# Patient Record
Sex: Female | Born: 1935 | Race: White | Hispanic: No | Smoking: Never smoker
Health system: Southern US, Community
[De-identification: ages and names within clinical notes are randomized; demographics above are authoritative.]

## PROBLEM LIST (undated history)

## (undated) DIAGNOSIS — I10 Essential (primary) hypertension: Secondary | ICD-10-CM

## (undated) DIAGNOSIS — J45909 Unspecified asthma, uncomplicated: Secondary | ICD-10-CM

## (undated) DIAGNOSIS — Z9889 Other specified postprocedural states: Secondary | ICD-10-CM

## (undated) DIAGNOSIS — M48061 Spinal stenosis, lumbar region without neurogenic claudication: Secondary | ICD-10-CM

## (undated) DIAGNOSIS — R933 Abnormal findings on diagnostic imaging of other parts of digestive tract: Secondary | ICD-10-CM

## (undated) DIAGNOSIS — IMO0001 Reserved for inherently not codable concepts without codable children: Secondary | ICD-10-CM

## (undated) DIAGNOSIS — R112 Nausea with vomiting, unspecified: Secondary | ICD-10-CM

## (undated) DIAGNOSIS — T8859XA Other complications of anesthesia, initial encounter: Secondary | ICD-10-CM

## (undated) DIAGNOSIS — M1990 Unspecified osteoarthritis, unspecified site: Secondary | ICD-10-CM

## (undated) DIAGNOSIS — N189 Chronic kidney disease, unspecified: Secondary | ICD-10-CM

## (undated) DIAGNOSIS — H903 Sensorineural hearing loss, bilateral: Secondary | ICD-10-CM

## (undated) DIAGNOSIS — K219 Gastro-esophageal reflux disease without esophagitis: Secondary | ICD-10-CM

## (undated) DIAGNOSIS — K5730 Diverticulosis of large intestine without perforation or abscess without bleeding: Secondary | ICD-10-CM

## (undated) DIAGNOSIS — R0602 Shortness of breath: Secondary | ICD-10-CM

## (undated) DIAGNOSIS — K5790 Diverticulosis of intestine, part unspecified, without perforation or abscess without bleeding: Secondary | ICD-10-CM

## (undated) DIAGNOSIS — T4145XA Adverse effect of unspecified anesthetic, initial encounter: Secondary | ICD-10-CM

## (undated) HISTORY — PX: ABDOMINAL HYSTERECTOMY: SHX81

## (undated) HISTORY — DX: Sensorineural hearing loss, bilateral: H90.3

## (undated) HISTORY — DX: Reserved for inherently not codable concepts without codable children: IMO0001

## (undated) HISTORY — DX: Chronic kidney disease, unspecified: N18.9

## (undated) HISTORY — DX: Abnormal findings on diagnostic imaging of other parts of digestive tract: R93.3

## (undated) HISTORY — DX: Spinal stenosis, lumbar region without neurogenic claudication: M48.061

## (undated) HISTORY — DX: Other specified postprocedural states: Z98.890

## (undated) HISTORY — DX: Diverticulosis of large intestine without perforation or abscess without bleeding: K57.30

---

## 1959-08-05 HISTORY — PX: COMBINED HYSTERECTOMY ABDOMINAL W/ A&P REPAIR / OOPHORECTOMY: SUR292

## 1998-03-16 ENCOUNTER — Encounter: Admission: RE | Admit: 1998-03-16 | Discharge: 1998-03-16 | Payer: Self-pay | Admitting: Family Medicine

## 1998-03-23 ENCOUNTER — Encounter: Admission: RE | Admit: 1998-03-23 | Discharge: 1998-03-23 | Payer: Self-pay | Admitting: Family Medicine

## 1998-03-30 ENCOUNTER — Encounter: Admission: RE | Admit: 1998-03-30 | Discharge: 1998-03-30 | Payer: Self-pay | Admitting: Family Medicine

## 1998-04-09 ENCOUNTER — Encounter: Admission: RE | Admit: 1998-04-09 | Discharge: 1998-04-09 | Payer: Self-pay | Admitting: Family Medicine

## 1998-04-20 ENCOUNTER — Encounter: Admission: RE | Admit: 1998-04-20 | Discharge: 1998-04-20 | Payer: Self-pay | Admitting: Family Medicine

## 1998-04-27 ENCOUNTER — Encounter: Admission: RE | Admit: 1998-04-27 | Discharge: 1998-04-27 | Payer: Self-pay | Admitting: Sports Medicine

## 1998-05-05 ENCOUNTER — Encounter: Admission: RE | Admit: 1998-05-05 | Discharge: 1998-05-05 | Payer: Self-pay | Admitting: Family Medicine

## 1998-05-18 ENCOUNTER — Encounter: Admission: RE | Admit: 1998-05-18 | Discharge: 1998-05-18 | Payer: Self-pay | Admitting: Sports Medicine

## 1998-06-01 ENCOUNTER — Encounter: Admission: RE | Admit: 1998-06-01 | Discharge: 1998-06-01 | Payer: Self-pay | Admitting: Family Medicine

## 1998-06-08 ENCOUNTER — Encounter: Admission: RE | Admit: 1998-06-08 | Discharge: 1998-06-08 | Payer: Self-pay | Admitting: Family Medicine

## 1998-06-29 ENCOUNTER — Encounter: Admission: RE | Admit: 1998-06-29 | Discharge: 1998-06-29 | Payer: Self-pay | Admitting: Family Medicine

## 1998-07-20 ENCOUNTER — Encounter: Admission: RE | Admit: 1998-07-20 | Discharge: 1998-07-20 | Payer: Self-pay | Admitting: Sports Medicine

## 1998-07-21 ENCOUNTER — Encounter: Admission: RE | Admit: 1998-07-21 | Discharge: 1998-07-21 | Payer: Self-pay | Admitting: Family Medicine

## 1998-07-23 ENCOUNTER — Encounter: Admission: RE | Admit: 1998-07-23 | Discharge: 1998-07-23 | Payer: Self-pay | Admitting: Family Medicine

## 1998-07-30 ENCOUNTER — Ambulatory Visit (HOSPITAL_COMMUNITY): Admission: RE | Admit: 1998-07-30 | Discharge: 1998-07-30 | Payer: Self-pay | Admitting: Gastroenterology

## 1998-08-17 ENCOUNTER — Encounter: Admission: RE | Admit: 1998-08-17 | Discharge: 1998-08-17 | Payer: Self-pay | Admitting: Family Medicine

## 1998-09-07 ENCOUNTER — Encounter: Admission: RE | Admit: 1998-09-07 | Discharge: 1998-09-07 | Payer: Self-pay | Admitting: Family Medicine

## 1998-09-08 ENCOUNTER — Encounter: Admission: RE | Admit: 1998-09-08 | Discharge: 1998-09-08 | Payer: Self-pay | Admitting: Family Medicine

## 1998-09-22 ENCOUNTER — Encounter: Admission: RE | Admit: 1998-09-22 | Discharge: 1998-09-22 | Payer: Self-pay | Admitting: Family Medicine

## 1998-10-13 ENCOUNTER — Encounter: Admission: RE | Admit: 1998-10-13 | Discharge: 1998-10-13 | Payer: Self-pay | Admitting: Family Medicine

## 1998-11-09 ENCOUNTER — Encounter: Admission: RE | Admit: 1998-11-09 | Discharge: 1998-11-09 | Payer: Self-pay | Admitting: Family Medicine

## 1998-12-22 ENCOUNTER — Encounter: Admission: RE | Admit: 1998-12-22 | Discharge: 1998-12-22 | Payer: Self-pay | Admitting: Family Medicine

## 1999-02-08 ENCOUNTER — Encounter: Admission: RE | Admit: 1999-02-08 | Discharge: 1999-02-08 | Payer: Self-pay | Admitting: Family Medicine

## 1999-03-01 ENCOUNTER — Encounter: Admission: RE | Admit: 1999-03-01 | Discharge: 1999-03-01 | Payer: Self-pay | Admitting: Family Medicine

## 1999-03-29 ENCOUNTER — Encounter: Admission: RE | Admit: 1999-03-29 | Discharge: 1999-03-29 | Payer: Self-pay | Admitting: Family Medicine

## 1999-04-02 ENCOUNTER — Inpatient Hospital Stay (HOSPITAL_COMMUNITY): Admission: EM | Admit: 1999-04-02 | Discharge: 1999-04-03 | Payer: Self-pay | Admitting: Emergency Medicine

## 1999-04-02 ENCOUNTER — Encounter: Payer: Self-pay | Admitting: Emergency Medicine

## 1999-04-12 ENCOUNTER — Encounter: Admission: RE | Admit: 1999-04-12 | Discharge: 1999-04-12 | Payer: Self-pay | Admitting: Family Medicine

## 1999-04-26 ENCOUNTER — Encounter: Admission: RE | Admit: 1999-04-26 | Discharge: 1999-04-26 | Payer: Self-pay | Admitting: Family Medicine

## 1999-05-24 ENCOUNTER — Encounter: Admission: RE | Admit: 1999-05-24 | Discharge: 1999-05-24 | Payer: Self-pay | Admitting: Family Medicine

## 1999-06-14 ENCOUNTER — Encounter: Admission: RE | Admit: 1999-06-14 | Discharge: 1999-06-14 | Payer: Self-pay | Admitting: Family Medicine

## 1999-07-12 ENCOUNTER — Encounter: Admission: RE | Admit: 1999-07-12 | Discharge: 1999-07-12 | Payer: Self-pay | Admitting: Family Medicine

## 1999-07-19 ENCOUNTER — Encounter: Admission: RE | Admit: 1999-07-19 | Discharge: 1999-07-19 | Payer: Self-pay | Admitting: Family Medicine

## 1999-08-15 ENCOUNTER — Encounter: Admission: RE | Admit: 1999-08-15 | Discharge: 1999-08-15 | Payer: Self-pay | Admitting: Family Medicine

## 1999-08-30 ENCOUNTER — Encounter: Admission: RE | Admit: 1999-08-30 | Discharge: 1999-08-30 | Payer: Self-pay | Admitting: Family Medicine

## 1999-09-12 ENCOUNTER — Encounter: Admission: RE | Admit: 1999-09-12 | Discharge: 1999-09-12 | Payer: Self-pay | Admitting: Family Medicine

## 1999-09-21 ENCOUNTER — Encounter: Admission: RE | Admit: 1999-09-21 | Discharge: 1999-09-21 | Payer: Self-pay | Admitting: Family Medicine

## 1999-09-27 ENCOUNTER — Encounter: Admission: RE | Admit: 1999-09-27 | Discharge: 1999-09-27 | Payer: Self-pay | Admitting: Family Medicine

## 1999-11-01 ENCOUNTER — Encounter: Admission: RE | Admit: 1999-11-01 | Discharge: 1999-11-01 | Payer: Self-pay | Admitting: Family Medicine

## 1999-11-22 ENCOUNTER — Encounter: Admission: RE | Admit: 1999-11-22 | Discharge: 1999-11-22 | Payer: Self-pay | Admitting: Sports Medicine

## 1999-12-20 ENCOUNTER — Encounter: Admission: RE | Admit: 1999-12-20 | Discharge: 1999-12-20 | Payer: Self-pay | Admitting: Family Medicine

## 2000-01-11 ENCOUNTER — Encounter: Admission: RE | Admit: 2000-01-11 | Discharge: 2000-01-11 | Payer: Self-pay | Admitting: Family Medicine

## 2000-01-24 ENCOUNTER — Encounter: Admission: RE | Admit: 2000-01-24 | Discharge: 2000-01-24 | Payer: Self-pay | Admitting: Family Medicine

## 2000-02-28 ENCOUNTER — Encounter: Admission: RE | Admit: 2000-02-28 | Discharge: 2000-02-28 | Payer: Self-pay | Admitting: Family Medicine

## 2000-03-20 ENCOUNTER — Encounter: Admission: RE | Admit: 2000-03-20 | Discharge: 2000-03-20 | Payer: Self-pay | Admitting: Family Medicine

## 2000-05-08 ENCOUNTER — Encounter: Admission: RE | Admit: 2000-05-08 | Discharge: 2000-05-08 | Payer: Self-pay | Admitting: Family Medicine

## 2000-05-16 ENCOUNTER — Emergency Department (HOSPITAL_COMMUNITY): Admission: EM | Admit: 2000-05-16 | Discharge: 2000-05-16 | Payer: Self-pay | Admitting: Emergency Medicine

## 2000-05-17 ENCOUNTER — Encounter: Admission: RE | Admit: 2000-05-17 | Discharge: 2000-05-17 | Payer: Self-pay | Admitting: Family Medicine

## 2000-05-22 ENCOUNTER — Encounter: Admission: RE | Admit: 2000-05-22 | Discharge: 2000-05-22 | Payer: Self-pay | Admitting: Family Medicine

## 2000-06-05 ENCOUNTER — Encounter: Admission: RE | Admit: 2000-06-05 | Discharge: 2000-06-05 | Payer: Self-pay | Admitting: Family Medicine

## 2000-06-07 ENCOUNTER — Encounter: Admission: RE | Admit: 2000-06-07 | Discharge: 2000-06-07 | Payer: Self-pay | Admitting: Family Medicine

## 2000-06-08 ENCOUNTER — Encounter: Admission: RE | Admit: 2000-06-08 | Discharge: 2000-06-08 | Payer: Self-pay | Admitting: Family Medicine

## 2000-06-18 ENCOUNTER — Encounter: Admission: RE | Admit: 2000-06-18 | Discharge: 2000-06-18 | Payer: Self-pay | Admitting: Family Medicine

## 2000-06-19 ENCOUNTER — Encounter: Admission: RE | Admit: 2000-06-19 | Discharge: 2000-06-19 | Payer: Self-pay | Admitting: Sports Medicine

## 2000-07-10 ENCOUNTER — Encounter: Admission: RE | Admit: 2000-07-10 | Discharge: 2000-07-10 | Payer: Self-pay | Admitting: Family Medicine

## 2000-09-21 ENCOUNTER — Encounter: Admission: RE | Admit: 2000-09-21 | Discharge: 2000-09-21 | Payer: Self-pay | Admitting: Family Medicine

## 2000-10-05 ENCOUNTER — Encounter: Admission: RE | Admit: 2000-10-05 | Discharge: 2000-10-05 | Payer: Self-pay | Admitting: Family Medicine

## 2000-10-23 ENCOUNTER — Encounter: Admission: RE | Admit: 2000-10-23 | Discharge: 2000-10-23 | Payer: Self-pay | Admitting: Sports Medicine

## 2000-11-06 ENCOUNTER — Encounter: Admission: RE | Admit: 2000-11-06 | Discharge: 2000-11-06 | Payer: Self-pay | Admitting: Family Medicine

## 2000-12-04 HISTORY — PX: CHOLECYSTECTOMY OPEN: SUR202

## 2000-12-11 ENCOUNTER — Encounter: Admission: RE | Admit: 2000-12-11 | Discharge: 2000-12-11 | Payer: Self-pay | Admitting: Family Medicine

## 2000-12-17 ENCOUNTER — Encounter: Payer: Self-pay | Admitting: Family Medicine

## 2000-12-17 ENCOUNTER — Encounter: Admission: RE | Admit: 2000-12-17 | Discharge: 2000-12-17 | Payer: Self-pay | Admitting: Family Medicine

## 2000-12-21 ENCOUNTER — Encounter: Admission: RE | Admit: 2000-12-21 | Discharge: 2000-12-21 | Payer: Self-pay | Admitting: Family Medicine

## 2000-12-27 ENCOUNTER — Encounter: Admission: RE | Admit: 2000-12-27 | Discharge: 2000-12-27 | Payer: Self-pay | Admitting: Family Medicine

## 2000-12-28 ENCOUNTER — Encounter: Payer: Self-pay | Admitting: Family Medicine

## 2000-12-28 ENCOUNTER — Encounter: Admission: RE | Admit: 2000-12-28 | Discharge: 2000-12-28 | Payer: Self-pay | Admitting: Family Medicine

## 2001-01-01 ENCOUNTER — Encounter: Admission: RE | Admit: 2001-01-01 | Discharge: 2001-01-01 | Payer: Self-pay | Admitting: Family Medicine

## 2001-01-30 ENCOUNTER — Encounter: Admission: RE | Admit: 2001-01-30 | Discharge: 2001-01-30 | Payer: Self-pay | Admitting: Family Medicine

## 2001-02-27 ENCOUNTER — Encounter: Admission: RE | Admit: 2001-02-27 | Discharge: 2001-02-27 | Payer: Self-pay | Admitting: Family Medicine

## 2001-04-24 ENCOUNTER — Encounter: Admission: RE | Admit: 2001-04-24 | Discharge: 2001-04-24 | Payer: Self-pay | Admitting: Family Medicine

## 2001-05-03 ENCOUNTER — Encounter: Admission: RE | Admit: 2001-05-03 | Discharge: 2001-05-03 | Payer: Self-pay | Admitting: Family Medicine

## 2001-05-31 ENCOUNTER — Encounter: Admission: RE | Admit: 2001-05-31 | Discharge: 2001-05-31 | Payer: Self-pay | Admitting: Family Medicine

## 2001-07-05 ENCOUNTER — Encounter: Admission: RE | Admit: 2001-07-05 | Discharge: 2001-07-05 | Payer: Self-pay | Admitting: Family Medicine

## 2001-07-24 ENCOUNTER — Encounter: Admission: RE | Admit: 2001-07-24 | Discharge: 2001-07-24 | Payer: Self-pay | Admitting: Family Medicine

## 2001-08-09 ENCOUNTER — Encounter: Admission: RE | Admit: 2001-08-09 | Discharge: 2001-08-09 | Payer: Self-pay | Admitting: Family Medicine

## 2001-09-20 ENCOUNTER — Encounter: Admission: RE | Admit: 2001-09-20 | Discharge: 2001-09-20 | Payer: Self-pay | Admitting: Family Medicine

## 2001-10-15 ENCOUNTER — Encounter: Admission: RE | Admit: 2001-10-15 | Discharge: 2001-10-15 | Payer: Self-pay | Admitting: Family Medicine

## 2001-11-15 ENCOUNTER — Encounter: Admission: RE | Admit: 2001-11-15 | Discharge: 2001-11-15 | Payer: Self-pay | Admitting: Family Medicine

## 2002-01-27 ENCOUNTER — Encounter: Admission: RE | Admit: 2002-01-27 | Discharge: 2002-01-27 | Payer: Self-pay | Admitting: Family Medicine

## 2002-02-01 ENCOUNTER — Inpatient Hospital Stay (HOSPITAL_COMMUNITY): Admission: EM | Admit: 2002-02-01 | Discharge: 2002-02-04 | Payer: Self-pay

## 2002-02-03 ENCOUNTER — Encounter: Admission: RE | Admit: 2002-02-03 | Discharge: 2002-02-03 | Payer: Self-pay | Admitting: Family Medicine

## 2002-02-11 ENCOUNTER — Encounter: Admission: RE | Admit: 2002-02-11 | Discharge: 2002-02-11 | Payer: Self-pay | Admitting: Family Medicine

## 2002-02-12 ENCOUNTER — Encounter: Admission: RE | Admit: 2002-02-12 | Discharge: 2002-02-12 | Payer: Self-pay | Admitting: Family Medicine

## 2002-03-17 ENCOUNTER — Ambulatory Visit (HOSPITAL_COMMUNITY): Admission: RE | Admit: 2002-03-17 | Discharge: 2002-03-17 | Payer: Self-pay | Admitting: Gastroenterology

## 2002-03-17 ENCOUNTER — Encounter (INDEPENDENT_AMBULATORY_CARE_PROVIDER_SITE_OTHER): Payer: Self-pay | Admitting: Specialist

## 2002-05-16 ENCOUNTER — Encounter: Admission: RE | Admit: 2002-05-16 | Discharge: 2002-05-16 | Payer: Self-pay | Admitting: Family Medicine

## 2002-05-19 ENCOUNTER — Encounter: Admission: RE | Admit: 2002-05-19 | Discharge: 2002-05-19 | Payer: Self-pay | Admitting: Family Medicine

## 2002-05-28 ENCOUNTER — Encounter: Payer: Self-pay | Admitting: Otolaryngology

## 2002-05-28 ENCOUNTER — Ambulatory Visit (HOSPITAL_COMMUNITY): Admission: RE | Admit: 2002-05-28 | Discharge: 2002-05-28 | Payer: Self-pay | Admitting: Otolaryngology

## 2002-06-13 ENCOUNTER — Encounter: Admission: RE | Admit: 2002-06-13 | Discharge: 2002-06-13 | Payer: Self-pay | Admitting: Family Medicine

## 2002-06-13 ENCOUNTER — Other Ambulatory Visit: Admission: RE | Admit: 2002-06-13 | Discharge: 2002-06-13 | Payer: Self-pay | Admitting: Otolaryngology

## 2002-07-14 ENCOUNTER — Encounter: Admission: RE | Admit: 2002-07-14 | Discharge: 2002-07-14 | Payer: Self-pay | Admitting: Family Medicine

## 2002-07-29 ENCOUNTER — Encounter: Admission: RE | Admit: 2002-07-29 | Discharge: 2002-07-29 | Payer: Self-pay | Admitting: Family Medicine

## 2002-08-01 ENCOUNTER — Emergency Department (HOSPITAL_COMMUNITY): Admission: EM | Admit: 2002-08-01 | Discharge: 2002-08-02 | Payer: Self-pay | Admitting: Emergency Medicine

## 2002-08-02 ENCOUNTER — Encounter: Payer: Self-pay | Admitting: Emergency Medicine

## 2002-08-06 ENCOUNTER — Encounter: Admission: RE | Admit: 2002-08-06 | Discharge: 2002-08-06 | Payer: Self-pay | Admitting: Family Medicine

## 2002-08-07 ENCOUNTER — Encounter: Payer: Self-pay | Admitting: Otolaryngology

## 2002-08-07 ENCOUNTER — Encounter: Admission: RE | Admit: 2002-08-07 | Discharge: 2002-08-07 | Payer: Self-pay | Admitting: Otolaryngology

## 2002-08-08 ENCOUNTER — Encounter (INDEPENDENT_AMBULATORY_CARE_PROVIDER_SITE_OTHER): Payer: Self-pay | Admitting: Specialist

## 2002-08-08 ENCOUNTER — Ambulatory Visit (HOSPITAL_BASED_OUTPATIENT_CLINIC_OR_DEPARTMENT_OTHER): Admission: RE | Admit: 2002-08-08 | Discharge: 2002-08-08 | Payer: Self-pay | Admitting: Otolaryngology

## 2002-08-29 ENCOUNTER — Encounter: Admission: RE | Admit: 2002-08-29 | Discharge: 2002-08-29 | Payer: Self-pay | Admitting: Family Medicine

## 2002-09-26 ENCOUNTER — Encounter: Admission: RE | Admit: 2002-09-26 | Discharge: 2002-09-26 | Payer: Self-pay | Admitting: Family Medicine

## 2002-10-24 ENCOUNTER — Encounter: Admission: RE | Admit: 2002-10-24 | Discharge: 2002-10-24 | Payer: Self-pay | Admitting: Family Medicine

## 2002-11-21 ENCOUNTER — Encounter: Admission: RE | Admit: 2002-11-21 | Discharge: 2002-11-21 | Payer: Self-pay | Admitting: Family Medicine

## 2002-12-15 ENCOUNTER — Encounter: Admission: RE | Admit: 2002-12-15 | Discharge: 2002-12-15 | Payer: Self-pay | Admitting: Sports Medicine

## 2002-12-15 ENCOUNTER — Encounter: Payer: Self-pay | Admitting: Sports Medicine

## 2002-12-26 ENCOUNTER — Encounter: Admission: RE | Admit: 2002-12-26 | Discharge: 2002-12-26 | Payer: Self-pay | Admitting: Family Medicine

## 2003-01-26 ENCOUNTER — Encounter: Admission: RE | Admit: 2003-01-26 | Discharge: 2003-01-26 | Payer: Self-pay | Admitting: Family Medicine

## 2003-02-10 ENCOUNTER — Encounter: Admission: RE | Admit: 2003-02-10 | Discharge: 2003-02-10 | Payer: Self-pay | Admitting: Family Medicine

## 2003-02-27 ENCOUNTER — Encounter: Admission: RE | Admit: 2003-02-27 | Discharge: 2003-02-27 | Payer: Self-pay | Admitting: Family Medicine

## 2003-04-28 ENCOUNTER — Encounter: Admission: RE | Admit: 2003-04-28 | Discharge: 2003-04-28 | Payer: Self-pay | Admitting: Family Medicine

## 2003-05-16 ENCOUNTER — Encounter: Admission: RE | Admit: 2003-05-16 | Discharge: 2003-05-16 | Payer: Self-pay | Admitting: Family Medicine

## 2003-05-16 ENCOUNTER — Encounter: Payer: Self-pay | Admitting: Family Medicine

## 2003-05-22 ENCOUNTER — Encounter: Admission: RE | Admit: 2003-05-22 | Discharge: 2003-05-22 | Payer: Self-pay | Admitting: Family Medicine

## 2003-05-27 ENCOUNTER — Encounter: Admission: RE | Admit: 2003-05-27 | Discharge: 2003-05-27 | Payer: Self-pay | Admitting: Sports Medicine

## 2003-05-27 ENCOUNTER — Encounter: Payer: Self-pay | Admitting: Sports Medicine

## 2003-06-04 HISTORY — PX: BASAL CELL CARCINOMA EXCISION: SHX1214

## 2003-06-05 ENCOUNTER — Ambulatory Visit (HOSPITAL_COMMUNITY): Admission: RE | Admit: 2003-06-05 | Discharge: 2003-06-05 | Payer: Self-pay | Admitting: Family Medicine

## 2003-06-05 ENCOUNTER — Encounter: Admission: RE | Admit: 2003-06-05 | Discharge: 2003-06-05 | Payer: Self-pay | Admitting: Family Medicine

## 2003-06-16 ENCOUNTER — Encounter: Admission: RE | Admit: 2003-06-16 | Discharge: 2003-06-16 | Payer: Self-pay | Admitting: Family Medicine

## 2003-06-25 ENCOUNTER — Encounter: Payer: Self-pay | Admitting: Surgery

## 2003-06-25 ENCOUNTER — Encounter: Admission: RE | Admit: 2003-06-25 | Discharge: 2003-06-25 | Payer: Self-pay | Admitting: Surgery

## 2003-06-26 ENCOUNTER — Encounter (INDEPENDENT_AMBULATORY_CARE_PROVIDER_SITE_OTHER): Payer: Self-pay | Admitting: Surgery

## 2003-06-26 ENCOUNTER — Ambulatory Visit (HOSPITAL_BASED_OUTPATIENT_CLINIC_OR_DEPARTMENT_OTHER): Admission: RE | Admit: 2003-06-26 | Discharge: 2003-06-26 | Payer: Self-pay | Admitting: Surgery

## 2003-07-14 ENCOUNTER — Encounter: Admission: RE | Admit: 2003-07-14 | Discharge: 2003-07-14 | Payer: Self-pay | Admitting: Family Medicine

## 2003-07-16 ENCOUNTER — Ambulatory Visit (HOSPITAL_BASED_OUTPATIENT_CLINIC_OR_DEPARTMENT_OTHER): Admission: RE | Admit: 2003-07-16 | Discharge: 2003-07-16 | Payer: Self-pay | Admitting: Urology

## 2003-08-07 ENCOUNTER — Encounter: Payer: Self-pay | Admitting: Gastroenterology

## 2003-08-07 ENCOUNTER — Ambulatory Visit (HOSPITAL_COMMUNITY): Admission: RE | Admit: 2003-08-07 | Discharge: 2003-08-07 | Payer: Self-pay | Admitting: Gastroenterology

## 2003-08-11 ENCOUNTER — Encounter: Admission: RE | Admit: 2003-08-11 | Discharge: 2003-08-11 | Payer: Self-pay | Admitting: Family Medicine

## 2003-08-28 ENCOUNTER — Encounter: Payer: Self-pay | Admitting: Urology

## 2003-08-28 ENCOUNTER — Ambulatory Visit (HOSPITAL_COMMUNITY): Admission: RE | Admit: 2003-08-28 | Discharge: 2003-08-28 | Payer: Self-pay | Admitting: Urology

## 2003-09-02 ENCOUNTER — Ambulatory Visit (HOSPITAL_COMMUNITY): Admission: RE | Admit: 2003-09-02 | Discharge: 2003-09-02 | Payer: Self-pay | Admitting: Gastroenterology

## 2003-09-02 ENCOUNTER — Encounter: Payer: Self-pay | Admitting: Gastroenterology

## 2003-09-07 ENCOUNTER — Ambulatory Visit (HOSPITAL_BASED_OUTPATIENT_CLINIC_OR_DEPARTMENT_OTHER): Admission: RE | Admit: 2003-09-07 | Discharge: 2003-09-07 | Payer: Self-pay | Admitting: Urology

## 2003-09-07 ENCOUNTER — Ambulatory Visit (HOSPITAL_COMMUNITY): Admission: RE | Admit: 2003-09-07 | Discharge: 2003-09-07 | Payer: Self-pay | Admitting: Urology

## 2003-09-08 ENCOUNTER — Encounter: Admission: RE | Admit: 2003-09-08 | Discharge: 2003-09-08 | Payer: Self-pay | Admitting: Family Medicine

## 2003-09-22 ENCOUNTER — Encounter: Admission: RE | Admit: 2003-09-22 | Discharge: 2003-09-22 | Payer: Self-pay | Admitting: Family Medicine

## 2003-10-05 HISTORY — PX: RETINAL LASER PROCEDURE: SHX2339

## 2003-10-06 ENCOUNTER — Encounter: Admission: RE | Admit: 2003-10-06 | Discharge: 2003-10-06 | Payer: Self-pay | Admitting: Family Medicine

## 2003-10-16 ENCOUNTER — Ambulatory Visit (HOSPITAL_COMMUNITY): Admission: RE | Admit: 2003-10-16 | Discharge: 2003-10-16 | Payer: Self-pay | Admitting: Urology

## 2003-11-10 ENCOUNTER — Encounter: Admission: RE | Admit: 2003-11-10 | Discharge: 2003-11-10 | Payer: Self-pay | Admitting: Family Medicine

## 2003-12-05 HISTORY — PX: NEPHRECTOMY: SHX65

## 2003-12-08 ENCOUNTER — Encounter: Admission: RE | Admit: 2003-12-08 | Discharge: 2003-12-08 | Payer: Self-pay | Admitting: Family Medicine

## 2003-12-09 ENCOUNTER — Encounter: Admission: RE | Admit: 2003-12-09 | Discharge: 2003-12-09 | Payer: Self-pay | Admitting: Family Medicine

## 2003-12-18 ENCOUNTER — Encounter: Admission: RE | Admit: 2003-12-18 | Discharge: 2003-12-18 | Payer: Self-pay | Admitting: Family Medicine

## 2003-12-28 ENCOUNTER — Encounter (INDEPENDENT_AMBULATORY_CARE_PROVIDER_SITE_OTHER): Payer: Self-pay | Admitting: Specialist

## 2003-12-28 ENCOUNTER — Inpatient Hospital Stay (HOSPITAL_COMMUNITY): Admission: RE | Admit: 2003-12-28 | Discharge: 2004-01-02 | Payer: Self-pay | Admitting: Urology

## 2004-01-26 ENCOUNTER — Encounter: Admission: RE | Admit: 2004-01-26 | Discharge: 2004-01-26 | Payer: Self-pay | Admitting: Family Medicine

## 2004-02-09 ENCOUNTER — Encounter: Admission: RE | Admit: 2004-02-09 | Discharge: 2004-02-09 | Payer: Self-pay | Admitting: Family Medicine

## 2004-03-08 ENCOUNTER — Encounter: Admission: RE | Admit: 2004-03-08 | Discharge: 2004-03-08 | Payer: Self-pay | Admitting: Family Medicine

## 2004-04-05 ENCOUNTER — Encounter: Admission: RE | Admit: 2004-04-05 | Discharge: 2004-04-05 | Payer: Self-pay | Admitting: Family Medicine

## 2004-05-11 ENCOUNTER — Encounter: Admission: RE | Admit: 2004-05-11 | Discharge: 2004-05-11 | Payer: Self-pay | Admitting: Family Medicine

## 2004-06-08 ENCOUNTER — Encounter: Admission: RE | Admit: 2004-06-08 | Discharge: 2004-06-08 | Payer: Self-pay | Admitting: Sports Medicine

## 2004-06-13 ENCOUNTER — Encounter: Admission: RE | Admit: 2004-06-13 | Discharge: 2004-06-13 | Payer: Self-pay | Admitting: Family Medicine

## 2004-07-20 ENCOUNTER — Encounter: Admission: RE | Admit: 2004-07-20 | Discharge: 2004-07-20 | Payer: Self-pay | Admitting: Sports Medicine

## 2004-08-16 ENCOUNTER — Ambulatory Visit: Payer: Self-pay | Admitting: Family Medicine

## 2004-08-26 ENCOUNTER — Ambulatory Visit: Payer: Self-pay | Admitting: Family Medicine

## 2004-09-14 ENCOUNTER — Ambulatory Visit: Payer: Self-pay | Admitting: Family Medicine

## 2004-10-10 ENCOUNTER — Ambulatory Visit: Payer: Self-pay | Admitting: Sports Medicine

## 2004-10-10 ENCOUNTER — Ambulatory Visit (HOSPITAL_COMMUNITY): Admission: RE | Admit: 2004-10-10 | Discharge: 2004-10-10 | Payer: Self-pay | Admitting: Family Medicine

## 2004-10-19 ENCOUNTER — Ambulatory Visit: Payer: Self-pay | Admitting: Family Medicine

## 2004-11-16 ENCOUNTER — Ambulatory Visit: Payer: Self-pay | Admitting: Family Medicine

## 2004-12-02 ENCOUNTER — Ambulatory Visit: Payer: Self-pay | Admitting: Family Medicine

## 2004-12-21 ENCOUNTER — Ambulatory Visit: Payer: Self-pay | Admitting: Family Medicine

## 2005-01-25 ENCOUNTER — Ambulatory Visit: Payer: Self-pay | Admitting: Family Medicine

## 2005-02-21 ENCOUNTER — Encounter: Admission: RE | Admit: 2005-02-21 | Discharge: 2005-02-21 | Payer: Self-pay | Admitting: Family Medicine

## 2005-02-21 ENCOUNTER — Ambulatory Visit: Payer: Self-pay | Admitting: Family Medicine

## 2005-03-01 ENCOUNTER — Ambulatory Visit: Payer: Self-pay | Admitting: Family Medicine

## 2005-03-07 ENCOUNTER — Observation Stay (HOSPITAL_COMMUNITY): Admission: EM | Admit: 2005-03-07 | Discharge: 2005-03-08 | Payer: Self-pay | Admitting: Emergency Medicine

## 2005-03-07 ENCOUNTER — Ambulatory Visit: Payer: Self-pay | Admitting: Family Medicine

## 2005-03-29 ENCOUNTER — Ambulatory Visit: Payer: Self-pay | Admitting: Family Medicine

## 2005-04-26 ENCOUNTER — Ambulatory Visit: Payer: Self-pay | Admitting: Family Medicine

## 2005-05-09 ENCOUNTER — Ambulatory Visit: Payer: Self-pay | Admitting: Family Medicine

## 2005-05-16 ENCOUNTER — Ambulatory Visit: Payer: Self-pay | Admitting: Family Medicine

## 2005-05-24 ENCOUNTER — Ambulatory Visit: Payer: Self-pay | Admitting: Family Medicine

## 2005-06-16 ENCOUNTER — Ambulatory Visit: Payer: Self-pay | Admitting: Family Medicine

## 2005-06-21 ENCOUNTER — Ambulatory Visit: Payer: Self-pay | Admitting: Family Medicine

## 2005-07-26 ENCOUNTER — Ambulatory Visit: Payer: Self-pay | Admitting: Family Medicine

## 2005-08-30 ENCOUNTER — Ambulatory Visit: Payer: Self-pay | Admitting: Family Medicine

## 2005-09-27 ENCOUNTER — Ambulatory Visit: Payer: Self-pay | Admitting: Family Medicine

## 2005-10-23 ENCOUNTER — Encounter: Admission: RE | Admit: 2005-10-23 | Discharge: 2005-10-23 | Payer: Self-pay | Admitting: Gastroenterology

## 2005-11-01 ENCOUNTER — Ambulatory Visit: Payer: Self-pay | Admitting: Family Medicine

## 2005-11-17 ENCOUNTER — Ambulatory Visit: Payer: Self-pay | Admitting: Family Medicine

## 2005-11-22 ENCOUNTER — Ambulatory Visit: Payer: Self-pay | Admitting: Family Medicine

## 2005-12-27 ENCOUNTER — Ambulatory Visit (HOSPITAL_COMMUNITY): Admission: RE | Admit: 2005-12-27 | Discharge: 2005-12-27 | Payer: Self-pay | Admitting: Family Medicine

## 2005-12-27 ENCOUNTER — Ambulatory Visit: Payer: Self-pay | Admitting: Family Medicine

## 2005-12-29 ENCOUNTER — Ambulatory Visit: Payer: Self-pay | Admitting: Family Medicine

## 2006-01-01 ENCOUNTER — Ambulatory Visit (HOSPITAL_COMMUNITY): Admission: RE | Admit: 2006-01-01 | Discharge: 2006-01-01 | Payer: Self-pay | Admitting: Gastroenterology

## 2006-01-02 ENCOUNTER — Ambulatory Visit: Payer: Self-pay | Admitting: Family Medicine

## 2006-01-04 ENCOUNTER — Encounter (INDEPENDENT_AMBULATORY_CARE_PROVIDER_SITE_OTHER): Payer: Self-pay | Admitting: *Deleted

## 2006-01-15 ENCOUNTER — Ambulatory Visit: Payer: Self-pay | Admitting: Family Medicine

## 2006-01-17 ENCOUNTER — Ambulatory Visit: Payer: Self-pay | Admitting: Family Medicine

## 2006-01-23 ENCOUNTER — Ambulatory Visit: Payer: Self-pay | Admitting: Family Medicine

## 2006-01-30 ENCOUNTER — Ambulatory Visit: Payer: Self-pay | Admitting: Family Medicine

## 2006-01-30 DIAGNOSIS — IMO0001 Reserved for inherently not codable concepts without codable children: Secondary | ICD-10-CM

## 2006-01-30 HISTORY — DX: Reserved for inherently not codable concepts without codable children: IMO0001

## 2006-02-27 ENCOUNTER — Ambulatory Visit: Payer: Self-pay | Admitting: Family Medicine

## 2006-02-27 ENCOUNTER — Ambulatory Visit: Payer: Self-pay | Admitting: Sports Medicine

## 2006-03-27 ENCOUNTER — Ambulatory Visit: Payer: Self-pay | Admitting: Family Medicine

## 2006-04-12 ENCOUNTER — Ambulatory Visit: Payer: Self-pay | Admitting: Family Medicine

## 2006-04-24 ENCOUNTER — Ambulatory Visit: Payer: Self-pay | Admitting: Sports Medicine

## 2006-05-16 ENCOUNTER — Ambulatory Visit: Payer: Self-pay | Admitting: Family Medicine

## 2006-05-20 DIAGNOSIS — M48061 Spinal stenosis, lumbar region without neurogenic claudication: Secondary | ICD-10-CM

## 2006-05-20 HISTORY — DX: Spinal stenosis, lumbar region without neurogenic claudication: M48.061

## 2006-05-29 ENCOUNTER — Ambulatory Visit: Payer: Self-pay | Admitting: Family Medicine

## 2006-06-12 ENCOUNTER — Ambulatory Visit: Payer: Self-pay | Admitting: Sports Medicine

## 2006-07-03 ENCOUNTER — Ambulatory Visit: Payer: Self-pay | Admitting: Family Medicine

## 2006-07-31 ENCOUNTER — Ambulatory Visit: Payer: Self-pay | Admitting: Family Medicine

## 2006-08-09 ENCOUNTER — Ambulatory Visit: Payer: Self-pay | Admitting: Family Medicine

## 2006-08-28 ENCOUNTER — Ambulatory Visit: Payer: Self-pay | Admitting: Family Medicine

## 2006-09-14 ENCOUNTER — Ambulatory Visit: Payer: Self-pay | Admitting: Family Medicine

## 2006-09-25 ENCOUNTER — Ambulatory Visit: Payer: Self-pay | Admitting: Family Medicine

## 2006-10-04 DIAGNOSIS — H903 Sensorineural hearing loss, bilateral: Secondary | ICD-10-CM

## 2006-10-04 HISTORY — DX: Sensorineural hearing loss, bilateral: H90.3

## 2006-10-23 ENCOUNTER — Ambulatory Visit: Payer: Self-pay | Admitting: Family Medicine

## 2006-11-03 DIAGNOSIS — Z9889 Other specified postprocedural states: Secondary | ICD-10-CM

## 2006-11-03 HISTORY — DX: Other specified postprocedural states: Z98.890

## 2006-12-11 ENCOUNTER — Ambulatory Visit: Payer: Self-pay | Admitting: Family Medicine

## 2006-12-11 LAB — CONVERTED CEMR LAB
BUN: 40 mg/dL — ABNORMAL HIGH (ref 6–23)
Chloride: 108 meq/L (ref 96–112)
Glucose, Bld: 100 mg/dL — ABNORMAL HIGH (ref 70–99)
Potassium: 4.7 meq/L (ref 3.5–5.3)

## 2007-01-03 ENCOUNTER — Ambulatory Visit: Payer: Self-pay | Admitting: Family Medicine

## 2007-01-03 ENCOUNTER — Encounter (INDEPENDENT_AMBULATORY_CARE_PROVIDER_SITE_OTHER): Payer: Self-pay | Admitting: Family Medicine

## 2007-01-08 ENCOUNTER — Ambulatory Visit: Payer: Self-pay | Admitting: Family Medicine

## 2007-01-29 ENCOUNTER — Ambulatory Visit: Payer: Self-pay | Admitting: Family Medicine

## 2007-01-31 DIAGNOSIS — E1165 Type 2 diabetes mellitus with hyperglycemia: Secondary | ICD-10-CM

## 2007-01-31 DIAGNOSIS — K5730 Diverticulosis of large intestine without perforation or abscess without bleeding: Secondary | ICD-10-CM | POA: Insufficient documentation

## 2007-01-31 DIAGNOSIS — J309 Allergic rhinitis, unspecified: Secondary | ICD-10-CM | POA: Insufficient documentation

## 2007-01-31 DIAGNOSIS — E118 Type 2 diabetes mellitus with unspecified complications: Secondary | ICD-10-CM

## 2007-01-31 DIAGNOSIS — F411 Generalized anxiety disorder: Secondary | ICD-10-CM | POA: Insufficient documentation

## 2007-01-31 DIAGNOSIS — E785 Hyperlipidemia, unspecified: Secondary | ICD-10-CM

## 2007-01-31 DIAGNOSIS — H9190 Unspecified hearing loss, unspecified ear: Secondary | ICD-10-CM | POA: Insufficient documentation

## 2007-01-31 DIAGNOSIS — H269 Unspecified cataract: Secondary | ICD-10-CM

## 2007-01-31 DIAGNOSIS — E1149 Type 2 diabetes mellitus with other diabetic neurological complication: Secondary | ICD-10-CM | POA: Insufficient documentation

## 2007-01-31 DIAGNOSIS — K219 Gastro-esophageal reflux disease without esophagitis: Secondary | ICD-10-CM | POA: Insufficient documentation

## 2007-01-31 DIAGNOSIS — M48061 Spinal stenosis, lumbar region without neurogenic claudication: Secondary | ICD-10-CM | POA: Insufficient documentation

## 2007-02-01 ENCOUNTER — Encounter (INDEPENDENT_AMBULATORY_CARE_PROVIDER_SITE_OTHER): Payer: Self-pay | Admitting: *Deleted

## 2007-02-07 DIAGNOSIS — Z87442 Personal history of urinary calculi: Secondary | ICD-10-CM | POA: Insufficient documentation

## 2007-02-07 DIAGNOSIS — I10 Essential (primary) hypertension: Secondary | ICD-10-CM | POA: Insufficient documentation

## 2007-02-07 DIAGNOSIS — K469 Unspecified abdominal hernia without obstruction or gangrene: Secondary | ICD-10-CM | POA: Insufficient documentation

## 2007-02-23 ENCOUNTER — Encounter: Payer: Self-pay | Admitting: Family Medicine

## 2007-02-23 DIAGNOSIS — E11359 Type 2 diabetes mellitus with proliferative diabetic retinopathy without macular edema: Secondary | ICD-10-CM

## 2007-02-23 DIAGNOSIS — E113599 Type 2 diabetes mellitus with proliferative diabetic retinopathy without macular edema, unspecified eye: Secondary | ICD-10-CM | POA: Insufficient documentation

## 2007-03-05 ENCOUNTER — Ambulatory Visit: Payer: Self-pay | Admitting: Family Medicine

## 2007-03-05 LAB — CONVERTED CEMR LAB
Cholesterol: 157 mg/dL (ref 0–200)
HCT: 37.1 %
Potassium: 4.9 meq/L (ref 3.5–5.3)
RBC: 4.14 M/uL
Sodium: 141 meq/L (ref 135–145)
Total CHOL/HDL Ratio: 3.8
Triglycerides: 132 mg/dL (ref <150)
VLDL: 26 mg/dL (ref 0–40)

## 2007-03-06 ENCOUNTER — Telehealth: Payer: Self-pay | Admitting: Family Medicine

## 2007-03-20 ENCOUNTER — Encounter: Payer: Self-pay | Admitting: Family Medicine

## 2007-04-02 ENCOUNTER — Ambulatory Visit: Payer: Self-pay | Admitting: Family Medicine

## 2007-04-05 ENCOUNTER — Encounter: Admission: RE | Admit: 2007-04-05 | Discharge: 2007-04-05 | Payer: Self-pay | Admitting: Family Medicine

## 2007-04-30 ENCOUNTER — Ambulatory Visit: Payer: Self-pay | Admitting: Family Medicine

## 2007-05-09 ENCOUNTER — Encounter: Payer: Self-pay | Admitting: Family Medicine

## 2007-05-09 ENCOUNTER — Telehealth: Payer: Self-pay | Admitting: Family Medicine

## 2007-05-09 ENCOUNTER — Telehealth: Payer: Self-pay | Admitting: *Deleted

## 2007-05-20 ENCOUNTER — Encounter: Payer: Self-pay | Admitting: Family Medicine

## 2007-05-21 ENCOUNTER — Ambulatory Visit: Payer: Self-pay | Admitting: Family Medicine

## 2007-05-22 ENCOUNTER — Ambulatory Visit: Payer: Self-pay | Admitting: Family Medicine

## 2007-05-22 ENCOUNTER — Inpatient Hospital Stay (HOSPITAL_COMMUNITY): Admission: EM | Admit: 2007-05-22 | Discharge: 2007-05-31 | Payer: Self-pay | Admitting: Emergency Medicine

## 2007-05-22 ENCOUNTER — Encounter: Payer: Self-pay | Admitting: Family Medicine

## 2007-05-24 ENCOUNTER — Encounter: Payer: Self-pay | Admitting: Family Medicine

## 2007-06-01 ENCOUNTER — Telehealth (INDEPENDENT_AMBULATORY_CARE_PROVIDER_SITE_OTHER): Payer: Self-pay | Admitting: Family Medicine

## 2007-06-03 ENCOUNTER — Encounter: Payer: Self-pay | Admitting: Family Medicine

## 2007-06-11 ENCOUNTER — Encounter (HOSPITAL_COMMUNITY): Admission: RE | Admit: 2007-06-11 | Discharge: 2007-08-08 | Payer: Self-pay | Admitting: Nephrology

## 2007-06-13 ENCOUNTER — Inpatient Hospital Stay (HOSPITAL_COMMUNITY): Admission: AD | Admit: 2007-06-13 | Discharge: 2007-06-15 | Payer: Self-pay | Admitting: Cardiology

## 2007-06-18 ENCOUNTER — Ambulatory Visit: Payer: Self-pay | Admitting: Family Medicine

## 2007-06-18 ENCOUNTER — Telehealth: Payer: Self-pay | Admitting: *Deleted

## 2007-06-18 DIAGNOSIS — R269 Unspecified abnormalities of gait and mobility: Secondary | ICD-10-CM

## 2007-06-20 ENCOUNTER — Ambulatory Visit: Payer: Self-pay | Admitting: Family Medicine

## 2007-06-25 ENCOUNTER — Ambulatory Visit: Payer: Self-pay | Admitting: Family Medicine

## 2007-07-04 ENCOUNTER — Ambulatory Visit: Payer: Self-pay | Admitting: Family Medicine

## 2007-07-10 ENCOUNTER — Telehealth: Payer: Self-pay | Admitting: Family Medicine

## 2007-07-10 LAB — CONVERTED CEMR LAB
BUN: 75 mg/dL — ABNORMAL HIGH (ref 6–23)
CO2: 19 meq/L (ref 19–32)
Chloride: 113 meq/L — ABNORMAL HIGH (ref 96–112)
Creatinine, Ser: 2.4 mg/dL — ABNORMAL HIGH (ref 0.40–1.20)

## 2007-07-12 ENCOUNTER — Ambulatory Visit: Payer: Self-pay | Admitting: Family Medicine

## 2007-07-12 ENCOUNTER — Telehealth: Payer: Self-pay | Admitting: Family Medicine

## 2007-07-12 ENCOUNTER — Ambulatory Visit (HOSPITAL_COMMUNITY): Admission: RE | Admit: 2007-07-12 | Discharge: 2007-07-12 | Payer: Self-pay | Admitting: Family Medicine

## 2007-07-12 LAB — CONVERTED CEMR LAB
ALT: 17 units/L (ref 0–35)
AST: 27 units/L (ref 0–37)
Alkaline Phosphatase: 108 units/L (ref 39–117)
Basophils Absolute: 0 10*3/uL (ref 0.0–0.1)
Basophils Relative: 0 % (ref 0–1)
Calcium: 9.6 mg/dL (ref 8.4–10.5)
Chloride: 110 meq/L (ref 96–112)
Creatinine, Ser: 2.57 mg/dL — ABNORMAL HIGH (ref 0.40–1.20)
Eosinophils Absolute: 0.4 10*3/uL (ref 0.0–0.7)
Hemoglobin: 13.4 g/dL (ref 12.0–15.0)
MCHC: 33.5 g/dL (ref 30.0–36.0)
Monocytes Absolute: 0.4 10*3/uL (ref 0.2–0.7)
Neutro Abs: 11.1 10*3/uL — ABNORMAL HIGH (ref 1.7–7.7)
Neutrophils Relative %: 85 % — ABNORMAL HIGH (ref 43–77)
RDW: 17 % — ABNORMAL HIGH (ref 11.5–14.0)
Total Bilirubin: 0.3 mg/dL (ref 0.3–1.2)

## 2007-07-16 ENCOUNTER — Encounter: Payer: Self-pay | Admitting: Family Medicine

## 2007-07-25 ENCOUNTER — Encounter (HOSPITAL_COMMUNITY): Admission: RE | Admit: 2007-07-25 | Discharge: 2007-10-23 | Payer: Self-pay | Admitting: Cardiology

## 2007-08-01 ENCOUNTER — Ambulatory Visit: Payer: Self-pay | Admitting: Family Medicine

## 2007-08-06 ENCOUNTER — Telehealth: Payer: Self-pay | Admitting: Family Medicine

## 2007-08-06 LAB — CONVERTED CEMR LAB
CO2: 16 meq/L — ABNORMAL LOW (ref 19–32)
Glucose, Bld: 126 mg/dL — ABNORMAL HIGH (ref 70–99)
Potassium: 5.6 meq/L — ABNORMAL HIGH (ref 3.5–5.3)
Sodium: 139 meq/L (ref 135–145)

## 2007-08-08 ENCOUNTER — Encounter: Payer: Self-pay | Admitting: Family Medicine

## 2007-08-12 ENCOUNTER — Encounter: Payer: Self-pay | Admitting: Family Medicine

## 2007-08-12 ENCOUNTER — Telehealth: Payer: Self-pay | Admitting: *Deleted

## 2007-08-12 ENCOUNTER — Ambulatory Visit: Payer: Self-pay | Admitting: Sports Medicine

## 2007-08-12 LAB — CONVERTED CEMR LAB
BUN: 88 mg/dL — ABNORMAL HIGH (ref 6–23)
CO2: 17 meq/L — ABNORMAL LOW (ref 19–32)
Calcium: 9.4 mg/dL (ref 8.4–10.5)
Creatinine, Ser: 2.73 mg/dL — ABNORMAL HIGH (ref 0.40–1.20)
HCT: 36.2 % (ref 36.0–46.0)
Hemoglobin: 11.9 g/dL — ABNORMAL LOW (ref 12.0–15.0)
MCV: 90.5 fL (ref 78.0–100.0)
Platelets: 178 10*3/uL (ref 150–400)
RDW: 16.4 % — ABNORMAL HIGH (ref 11.5–14.0)
WBC: 10.4 10*3/uL (ref 4.0–10.5)

## 2007-08-21 ENCOUNTER — Encounter: Payer: Self-pay | Admitting: Family Medicine

## 2007-08-26 ENCOUNTER — Encounter: Payer: Self-pay | Admitting: Family Medicine

## 2007-08-29 ENCOUNTER — Ambulatory Visit: Payer: Self-pay | Admitting: Family Medicine

## 2007-09-09 ENCOUNTER — Encounter: Payer: Self-pay | Admitting: Family Medicine

## 2007-09-20 ENCOUNTER — Encounter: Payer: Self-pay | Admitting: Family Medicine

## 2007-09-25 ENCOUNTER — Encounter: Payer: Self-pay | Admitting: Family Medicine

## 2007-09-26 ENCOUNTER — Encounter: Payer: Self-pay | Admitting: Family Medicine

## 2007-10-22 ENCOUNTER — Encounter: Payer: Self-pay | Admitting: Family Medicine

## 2007-10-24 ENCOUNTER — Ambulatory Visit: Payer: Self-pay | Admitting: Family Medicine

## 2007-10-24 DIAGNOSIS — N039 Chronic nephritic syndrome with unspecified morphologic changes: Secondary | ICD-10-CM

## 2007-10-24 DIAGNOSIS — D631 Anemia in chronic kidney disease: Secondary | ICD-10-CM | POA: Insufficient documentation

## 2007-10-24 LAB — CONVERTED CEMR LAB
Calcium: 9.4 mg/dL (ref 8.4–10.5)
Glucose, Bld: 136 mg/dL — ABNORMAL HIGH (ref 70–99)
MCHC: 32.2 g/dL (ref 30.0–36.0)
MCV: 96.6 fL (ref 78.0–100.0)
Platelets: 176 10*3/uL (ref 150–400)
Potassium: 4.9 meq/L (ref 3.5–5.3)
RBC: 4.09 M/uL (ref 3.87–5.11)
Sodium: 142 meq/L (ref 135–145)
TSH: 1.912 microintl units/mL (ref 0.350–5.50)

## 2007-10-29 ENCOUNTER — Encounter: Payer: Self-pay | Admitting: Family Medicine

## 2007-11-07 ENCOUNTER — Encounter: Payer: Self-pay | Admitting: Family Medicine

## 2007-11-07 ENCOUNTER — Ambulatory Visit: Payer: Self-pay | Admitting: Family Medicine

## 2007-11-07 ENCOUNTER — Telehealth (INDEPENDENT_AMBULATORY_CARE_PROVIDER_SITE_OTHER): Payer: Self-pay | Admitting: *Deleted

## 2007-11-07 DIAGNOSIS — Z85828 Personal history of other malignant neoplasm of skin: Secondary | ICD-10-CM

## 2007-11-13 ENCOUNTER — Telehealth: Payer: Self-pay | Admitting: Family Medicine

## 2007-11-14 ENCOUNTER — Ambulatory Visit: Payer: Self-pay | Admitting: Family Medicine

## 2007-11-15 ENCOUNTER — Encounter: Payer: Self-pay | Admitting: Family Medicine

## 2007-11-18 ENCOUNTER — Encounter: Payer: Self-pay | Admitting: Family Medicine

## 2007-11-21 ENCOUNTER — Encounter: Admission: RE | Admit: 2007-11-21 | Discharge: 2007-11-22 | Payer: Self-pay | Admitting: Ophthalmology

## 2007-11-21 ENCOUNTER — Ambulatory Visit: Payer: Self-pay | Admitting: Family Medicine

## 2007-12-10 ENCOUNTER — Telehealth: Payer: Self-pay | Admitting: Family Medicine

## 2007-12-24 ENCOUNTER — Encounter: Payer: Self-pay | Admitting: Family Medicine

## 2007-12-26 ENCOUNTER — Ambulatory Visit: Payer: Self-pay | Admitting: Family Medicine

## 2007-12-31 ENCOUNTER — Telehealth: Payer: Self-pay | Admitting: Family Medicine

## 2008-01-05 ENCOUNTER — Emergency Department (HOSPITAL_COMMUNITY): Admission: EM | Admit: 2008-01-05 | Discharge: 2008-01-05 | Payer: Self-pay | Admitting: Emergency Medicine

## 2008-01-06 ENCOUNTER — Telehealth: Payer: Self-pay | Admitting: *Deleted

## 2008-01-07 ENCOUNTER — Ambulatory Visit: Payer: Self-pay | Admitting: Family Medicine

## 2008-01-07 DIAGNOSIS — I2510 Atherosclerotic heart disease of native coronary artery without angina pectoris: Secondary | ICD-10-CM | POA: Insufficient documentation

## 2008-01-30 ENCOUNTER — Telehealth: Payer: Self-pay | Admitting: *Deleted

## 2008-01-30 ENCOUNTER — Ambulatory Visit: Payer: Self-pay | Admitting: Family Medicine

## 2008-02-25 ENCOUNTER — Encounter: Payer: Self-pay | Admitting: Family Medicine

## 2008-02-27 ENCOUNTER — Ambulatory Visit: Payer: Self-pay | Admitting: Family Medicine

## 2008-02-27 LAB — CONVERTED CEMR LAB
CO2: 18 meq/L — ABNORMAL LOW (ref 19–32)
Chloride: 111 meq/L (ref 96–112)
Creatinine, Ser: 2.06 mg/dL — ABNORMAL HIGH (ref 0.40–1.20)
Potassium: 5.1 meq/L (ref 3.5–5.3)
Sodium: 145 meq/L (ref 135–145)

## 2008-03-05 ENCOUNTER — Telehealth: Payer: Self-pay | Admitting: Family Medicine

## 2008-03-19 ENCOUNTER — Encounter: Payer: Self-pay | Admitting: Family Medicine

## 2008-03-19 ENCOUNTER — Ambulatory Visit: Payer: Self-pay | Admitting: Family Medicine

## 2008-03-19 LAB — CONVERTED CEMR LAB
HDL: 42 mg/dL (ref >39)
LDL Cholesterol: 126 mg/dL — ABNORMAL HIGH (ref 0–99)
Total CHOL/HDL Ratio: 4.7
Triglycerides: 144 mg/dL (ref <150)
VLDL: 29 mg/dL (ref 0–40)

## 2008-03-23 ENCOUNTER — Encounter: Payer: Self-pay | Admitting: Family Medicine

## 2008-03-26 ENCOUNTER — Ambulatory Visit: Payer: Self-pay | Admitting: Family Medicine

## 2008-03-26 ENCOUNTER — Encounter: Payer: Self-pay | Admitting: Family Medicine

## 2008-03-26 DIAGNOSIS — M25549 Pain in joints of unspecified hand: Secondary | ICD-10-CM | POA: Insufficient documentation

## 2008-03-31 ENCOUNTER — Encounter: Payer: Self-pay | Admitting: Family Medicine

## 2008-03-31 LAB — CONVERTED CEMR LAB: Uric Acid, Serum: 11.2 mg/dL — ABNORMAL HIGH (ref 2.4–7.0)

## 2008-04-07 ENCOUNTER — Telehealth: Payer: Self-pay | Admitting: Family Medicine

## 2008-04-23 ENCOUNTER — Telehealth: Payer: Self-pay | Admitting: Family Medicine

## 2008-04-28 ENCOUNTER — Ambulatory Visit: Payer: Self-pay | Admitting: Family Medicine

## 2008-04-28 DIAGNOSIS — M109 Gout, unspecified: Secondary | ICD-10-CM | POA: Insufficient documentation

## 2008-05-06 ENCOUNTER — Telehealth: Payer: Self-pay | Admitting: *Deleted

## 2008-05-06 ENCOUNTER — Encounter: Payer: Self-pay | Admitting: Family Medicine

## 2008-05-07 ENCOUNTER — Encounter: Payer: Self-pay | Admitting: *Deleted

## 2008-05-28 ENCOUNTER — Ambulatory Visit: Payer: Self-pay | Admitting: Family Medicine

## 2008-05-28 LAB — CONVERTED CEMR LAB
BUN: 74 mg/dL — ABNORMAL HIGH (ref 6–23)
Creatinine, Ser: 2.24 mg/dL — ABNORMAL HIGH (ref 0.40–1.20)
Glucose, Bld: 80 mg/dL (ref 70–99)
Magnesium: 1.5 mg/dL (ref 1.5–2.5)
Potassium: 4.7 meq/L (ref 3.5–5.3)
Uric Acid, Serum: 6.8 mg/dL (ref 2.4–7.0)

## 2008-06-11 ENCOUNTER — Ambulatory Visit: Payer: Self-pay | Admitting: Family Medicine

## 2008-06-15 ENCOUNTER — Encounter: Payer: Self-pay | Admitting: *Deleted

## 2008-06-15 ENCOUNTER — Ambulatory Visit: Payer: Self-pay | Admitting: Family Medicine

## 2008-06-15 ENCOUNTER — Encounter: Payer: Self-pay | Admitting: Family Medicine

## 2008-06-15 DIAGNOSIS — N3941 Urge incontinence: Secondary | ICD-10-CM | POA: Insufficient documentation

## 2008-06-15 LAB — CONVERTED CEMR LAB
Blood in Urine, dipstick: NEGATIVE
Glucose, Urine, Semiquant: NEGATIVE
Protein, U semiquant: 100
Urobilinogen, UA: 0.2
WBC Urine, dipstick: NEGATIVE
pH: 5.5

## 2008-06-16 LAB — CONVERTED CEMR LAB
BUN: 85 mg/dL — ABNORMAL HIGH (ref 6–23)
Creatinine, Ser: 2.77 mg/dL — ABNORMAL HIGH (ref 0.40–1.20)
Potassium: 5.4 meq/L — ABNORMAL HIGH (ref 3.5–5.3)

## 2008-06-18 ENCOUNTER — Ambulatory Visit: Payer: Self-pay | Admitting: Family Medicine

## 2008-06-24 ENCOUNTER — Encounter: Payer: Self-pay | Admitting: Family Medicine

## 2008-06-24 ENCOUNTER — Ambulatory Visit: Payer: Self-pay | Admitting: Family Medicine

## 2008-06-24 LAB — CONVERTED CEMR LAB
BUN: 88 mg/dL — ABNORMAL HIGH (ref 6–23)
CO2: 18 meq/L — ABNORMAL LOW (ref 19–32)
Glucose, Bld: 65 mg/dL — ABNORMAL LOW (ref 70–99)
Hemoglobin: 10.9 g/dL — ABNORMAL LOW (ref 12.0–15.0)
Potassium: 5.7 meq/L — ABNORMAL HIGH (ref 3.5–5.3)
RBC: 3.53 M/uL — ABNORMAL LOW (ref 3.87–5.11)
Sodium: 138 meq/L (ref 135–145)
WBC: 10.1 10*3/uL (ref 4.0–10.5)

## 2008-06-25 ENCOUNTER — Encounter: Payer: Self-pay | Admitting: Family Medicine

## 2008-06-29 ENCOUNTER — Telehealth: Payer: Self-pay | Admitting: Family Medicine

## 2008-06-30 ENCOUNTER — Telehealth: Payer: Self-pay | Admitting: Family Medicine

## 2008-07-02 ENCOUNTER — Ambulatory Visit: Payer: Self-pay | Admitting: Family Medicine

## 2008-07-06 ENCOUNTER — Ambulatory Visit: Payer: Self-pay | Admitting: Family Medicine

## 2008-07-06 ENCOUNTER — Encounter: Payer: Self-pay | Admitting: Family Medicine

## 2008-07-07 LAB — CONVERTED CEMR LAB
BUN: 57 mg/dL — ABNORMAL HIGH (ref 6–23)
CO2: 21 meq/L (ref 19–32)
Calcium: 8.9 mg/dL (ref 8.4–10.5)
Creatinine, Ser: 2.31 mg/dL — ABNORMAL HIGH (ref 0.40–1.20)
Glucose, Bld: 90 mg/dL (ref 70–99)

## 2008-07-14 ENCOUNTER — Ambulatory Visit: Payer: Self-pay | Admitting: Family Medicine

## 2008-07-17 LAB — CONVERTED CEMR LAB
Chloride: 105 meq/L (ref 96–112)
Creatinine, Ser: 2.77 mg/dL — ABNORMAL HIGH (ref 0.40–1.20)
Platelets: 185 10*3/uL (ref 150–400)
Potassium: 5.1 meq/L (ref 3.5–5.3)
RDW: 14.9 % (ref 11.5–15.5)
Sodium: 140 meq/L (ref 135–145)

## 2008-07-28 ENCOUNTER — Ambulatory Visit: Payer: Self-pay | Admitting: Family Medicine

## 2008-07-28 LAB — CONVERTED CEMR LAB
CO2: 19 meq/L (ref 19–32)
Chloride: 108 meq/L (ref 96–112)
Glucose, Bld: 110 mg/dL — ABNORMAL HIGH (ref 70–99)
Potassium: 4.5 meq/L (ref 3.5–5.3)
Sodium: 141 meq/L (ref 135–145)
Uric Acid, Serum: 6.7 mg/dL (ref 2.4–7.0)

## 2008-07-29 ENCOUNTER — Encounter: Payer: Self-pay | Admitting: Family Medicine

## 2008-08-13 ENCOUNTER — Telehealth: Payer: Self-pay | Admitting: Family Medicine

## 2008-08-25 ENCOUNTER — Ambulatory Visit: Payer: Self-pay | Admitting: Family Medicine

## 2008-09-01 ENCOUNTER — Ambulatory Visit: Payer: Self-pay | Admitting: Family Medicine

## 2008-09-02 ENCOUNTER — Telehealth: Payer: Self-pay | Admitting: Family Medicine

## 2008-09-07 ENCOUNTER — Telehealth: Payer: Self-pay | Admitting: Family Medicine

## 2008-09-09 ENCOUNTER — Telehealth: Payer: Self-pay | Admitting: Family Medicine

## 2008-09-10 ENCOUNTER — Telehealth: Payer: Self-pay | Admitting: Family Medicine

## 2008-09-10 ENCOUNTER — Ambulatory Visit: Payer: Self-pay | Admitting: Family Medicine

## 2008-09-11 ENCOUNTER — Encounter: Payer: Self-pay | Admitting: Family Medicine

## 2008-09-14 ENCOUNTER — Telehealth: Payer: Self-pay | Admitting: Family Medicine

## 2008-09-17 ENCOUNTER — Telehealth: Payer: Self-pay | Admitting: Family Medicine

## 2008-09-24 ENCOUNTER — Encounter: Payer: Self-pay | Admitting: Family Medicine

## 2008-09-28 ENCOUNTER — Ambulatory Visit: Payer: Self-pay | Admitting: Family Medicine

## 2008-09-28 LAB — CONVERTED CEMR LAB
BUN: 79 mg/dL — ABNORMAL HIGH (ref 6–23)
Chloride: 107 meq/L (ref 96–112)
Hemoglobin: 10.9 g/dL — ABNORMAL LOW (ref 12.0–15.0)
Platelets: 207 10*3/uL (ref 150–400)
Potassium: 4.6 meq/L (ref 3.5–5.3)
RDW: 14.1 % (ref 11.5–15.5)

## 2008-10-01 ENCOUNTER — Ambulatory Visit: Payer: Self-pay | Admitting: Family Medicine

## 2008-10-03 IMAGING — CR DG FINGER INDEX 2+V*R*
3 series · 3 of 3 positions shown · non-contrast
Comparison: none

CLINICAL DATA: Diabetes.  Pain and swelling of the DIP joint of second finger.
 RIGHT SECOND FINGER ? 3 VIEWS:

[x finger pa right]
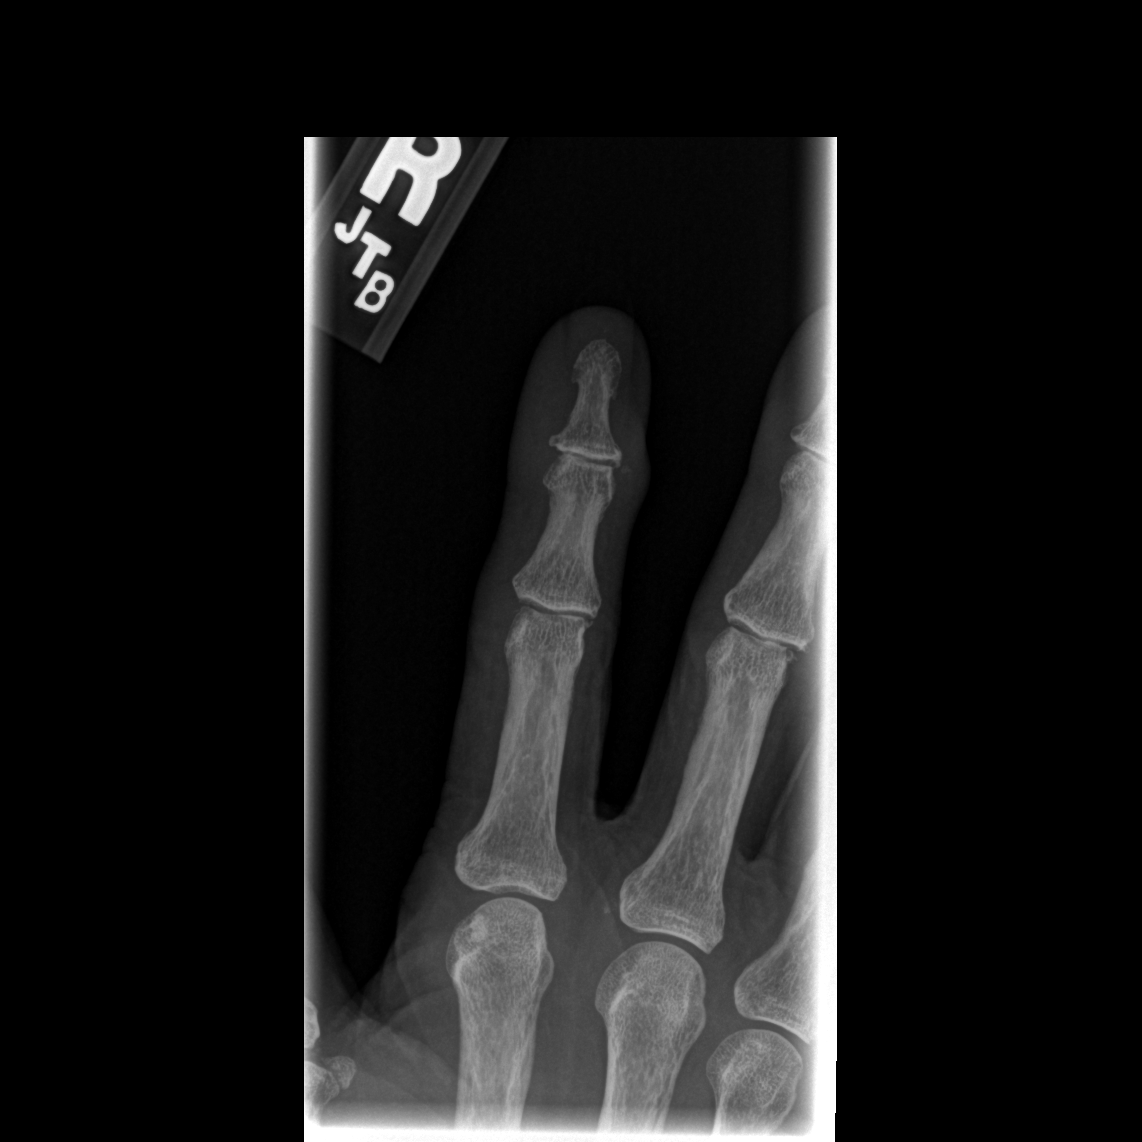

[x finger obl. right]
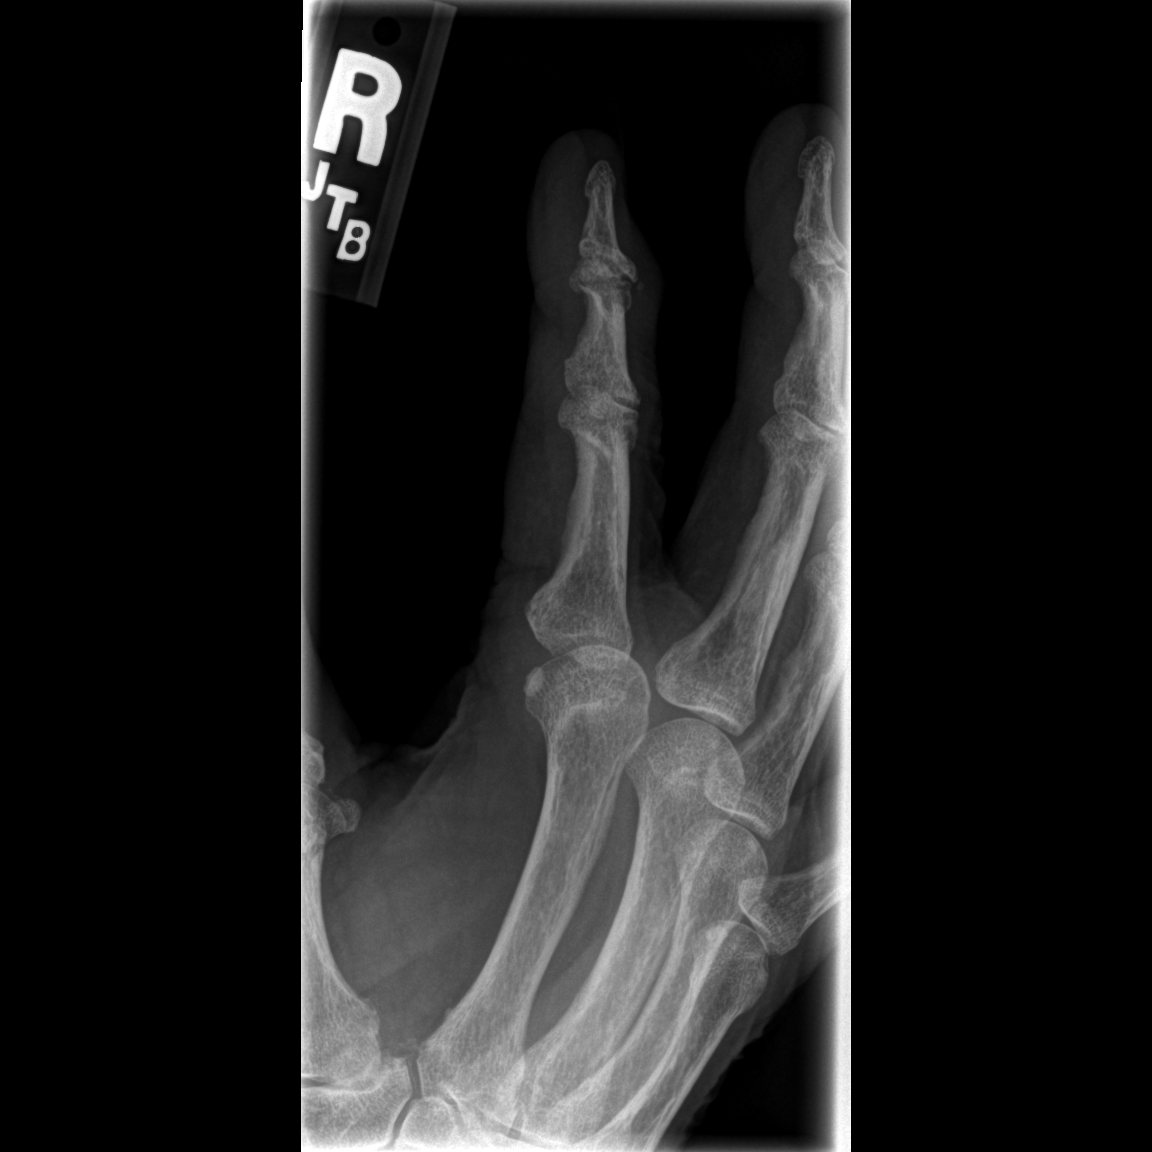

[x finger lateral right]
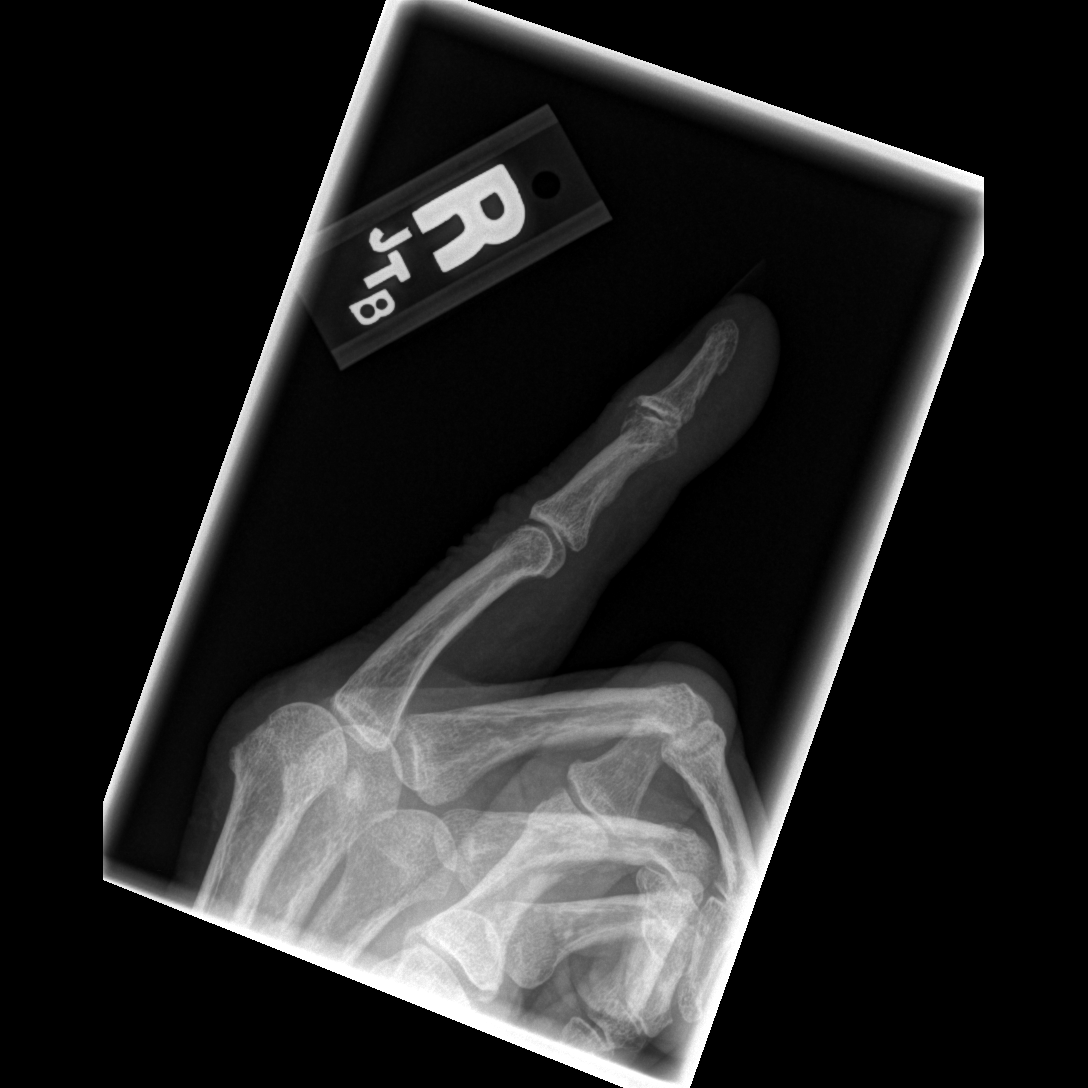

[3 of 3 positions shown; findings below may reference images not displayed]

FINDINGS: Normal alignment and no fracture.   Degenerative spurring is present in the DIP joint.  No erosion identified.
IMPRESSION: Osteoarthritis of the second DIP.  No acute bony abnormality.

## 2008-10-09 ENCOUNTER — Encounter: Payer: Self-pay | Admitting: Family Medicine

## 2008-10-19 ENCOUNTER — Telehealth: Payer: Self-pay | Admitting: Family Medicine

## 2008-10-22 ENCOUNTER — Ambulatory Visit: Payer: Self-pay | Admitting: Family Medicine

## 2008-10-22 DIAGNOSIS — N185 Chronic kidney disease, stage 5: Secondary | ICD-10-CM | POA: Insufficient documentation

## 2008-10-23 ENCOUNTER — Telehealth: Payer: Self-pay | Admitting: Family Medicine

## 2008-10-23 LAB — CONVERTED CEMR LAB
CO2: 22 meq/L (ref 19–32)
Glucose, Bld: 73 mg/dL (ref 70–99)
Potassium: 4.6 meq/L (ref 3.5–5.3)
Sodium: 142 meq/L (ref 135–145)

## 2008-11-02 ENCOUNTER — Encounter (INDEPENDENT_AMBULATORY_CARE_PROVIDER_SITE_OTHER): Payer: Self-pay | Admitting: *Deleted

## 2008-11-19 ENCOUNTER — Ambulatory Visit: Payer: Self-pay | Admitting: Family Medicine

## 2008-12-02 ENCOUNTER — Encounter: Payer: Self-pay | Admitting: Family Medicine

## 2008-12-09 ENCOUNTER — Encounter: Payer: Self-pay | Admitting: Family Medicine

## 2008-12-09 ENCOUNTER — Telehealth: Payer: Self-pay | Admitting: Family Medicine

## 2008-12-10 ENCOUNTER — Encounter: Payer: Self-pay | Admitting: Family Medicine

## 2008-12-17 ENCOUNTER — Ambulatory Visit: Payer: Self-pay | Admitting: Family Medicine

## 2008-12-18 ENCOUNTER — Telehealth: Payer: Self-pay | Admitting: Family Medicine

## 2009-01-05 ENCOUNTER — Encounter: Payer: Self-pay | Admitting: Family Medicine

## 2009-01-11 ENCOUNTER — Telehealth: Payer: Self-pay | Admitting: Family Medicine

## 2009-01-20 ENCOUNTER — Encounter: Payer: Self-pay | Admitting: Family Medicine

## 2009-01-21 ENCOUNTER — Ambulatory Visit: Payer: Self-pay | Admitting: Family Medicine

## 2009-01-21 ENCOUNTER — Encounter: Payer: Self-pay | Admitting: Family Medicine

## 2009-01-22 LAB — CONVERTED CEMR LAB
BUN: 68 mg/dL — ABNORMAL HIGH (ref 6–23)
CO2: 21 meq/L (ref 19–32)
Chloride: 106 meq/L (ref 96–112)
Creatinine, Ser: 2.72 mg/dL — ABNORMAL HIGH (ref 0.40–1.20)

## 2009-01-28 ENCOUNTER — Ambulatory Visit: Payer: Self-pay | Admitting: *Deleted

## 2009-02-11 ENCOUNTER — Ambulatory Visit: Payer: Self-pay | Admitting: Family Medicine

## 2009-02-11 DIAGNOSIS — K589 Irritable bowel syndrome without diarrhea: Secondary | ICD-10-CM

## 2009-02-12 ENCOUNTER — Ambulatory Visit (HOSPITAL_COMMUNITY): Admission: RE | Admit: 2009-02-12 | Discharge: 2009-02-12 | Payer: Self-pay | Admitting: *Deleted

## 2009-02-12 ENCOUNTER — Ambulatory Visit: Payer: Self-pay | Admitting: *Deleted

## 2009-02-22 ENCOUNTER — Ambulatory Visit: Payer: Self-pay | Admitting: *Deleted

## 2009-02-25 ENCOUNTER — Ambulatory Visit: Payer: Self-pay | Admitting: *Deleted

## 2009-02-26 ENCOUNTER — Ambulatory Visit (HOSPITAL_COMMUNITY): Admission: RE | Admit: 2009-02-26 | Discharge: 2009-02-26 | Payer: Self-pay | Admitting: Vascular Surgery

## 2009-03-02 ENCOUNTER — Ambulatory Visit: Payer: Self-pay | Admitting: Vascular Surgery

## 2009-03-18 ENCOUNTER — Ambulatory Visit: Payer: Self-pay | Admitting: Family Medicine

## 2009-03-18 LAB — CONVERTED CEMR LAB
AST: 22 units/L (ref 0–37)
Albumin: 4 g/dL (ref 3.5–5.2)
Alkaline Phosphatase: 96 units/L (ref 39–117)
HDL: 40 mg/dL (ref >39)
LDL Cholesterol: 70 mg/dL (ref 0–99)
MCHC: 33.1 g/dL (ref 30.0–36.0)
Potassium: 4.8 meq/L (ref 3.5–5.3)
RDW: 13.7 % (ref 11.5–15.5)
Sodium: 143 meq/L (ref 135–145)
TSH: 2.941 microintl units/mL (ref 0.350–4.500)
Total Protein: 6.8 g/dL (ref 6.0–8.3)

## 2009-03-24 ENCOUNTER — Encounter: Payer: Self-pay | Admitting: Family Medicine

## 2009-04-01 ENCOUNTER — Ambulatory Visit: Payer: Self-pay | Admitting: Family Medicine

## 2009-04-05 ENCOUNTER — Encounter: Payer: Self-pay | Admitting: *Deleted

## 2009-04-13 ENCOUNTER — Telehealth: Payer: Self-pay | Admitting: Family Medicine

## 2009-04-15 ENCOUNTER — Ambulatory Visit: Payer: Self-pay | Admitting: *Deleted

## 2009-04-15 ENCOUNTER — Encounter: Payer: Self-pay | Admitting: Family Medicine

## 2009-04-21 ENCOUNTER — Encounter: Payer: Self-pay | Admitting: Family Medicine

## 2009-04-29 ENCOUNTER — Ambulatory Visit: Payer: Self-pay | Admitting: Family Medicine

## 2009-05-10 ENCOUNTER — Ambulatory Visit: Payer: Self-pay | Admitting: Family Medicine

## 2009-05-14 ENCOUNTER — Telehealth: Payer: Self-pay | Admitting: Family Medicine

## 2009-05-18 ENCOUNTER — Telehealth: Payer: Self-pay | Admitting: Family Medicine

## 2009-05-20 ENCOUNTER — Encounter: Payer: Self-pay | Admitting: Family Medicine

## 2009-05-20 ENCOUNTER — Ambulatory Visit: Payer: Self-pay | Admitting: Family Medicine

## 2009-05-21 LAB — CONVERTED CEMR LAB
Glucose, Bld: 223 mg/dL — ABNORMAL HIGH (ref 70–99)
MCHC: 34.1 g/dL (ref 30.0–36.0)
Platelets: 226 10*3/uL (ref 150–400)
Potassium: 4.5 meq/L (ref 3.5–5.3)
RBC: 3.62 M/uL — ABNORMAL LOW (ref 3.87–5.11)
Sodium: 138 meq/L (ref 135–145)
Uric Acid, Serum: 8.4 mg/dL — ABNORMAL HIGH (ref 2.4–7.0)
WBC: 15.5 10*3/uL — ABNORMAL HIGH (ref 4.0–10.5)

## 2009-05-27 ENCOUNTER — Ambulatory Visit: Payer: Self-pay | Admitting: Family Medicine

## 2009-05-27 DIAGNOSIS — Z905 Acquired absence of kidney: Secondary | ICD-10-CM | POA: Insufficient documentation

## 2009-06-08 ENCOUNTER — Telehealth: Payer: Self-pay | Admitting: Family Medicine

## 2009-06-15 ENCOUNTER — Ambulatory Visit: Payer: Self-pay | Admitting: Family Medicine

## 2009-06-15 ENCOUNTER — Encounter: Payer: Self-pay | Admitting: Family Medicine

## 2009-06-15 DIAGNOSIS — R5383 Other fatigue: Secondary | ICD-10-CM

## 2009-06-15 DIAGNOSIS — R5381 Other malaise: Secondary | ICD-10-CM

## 2009-06-16 LAB — CONVERTED CEMR LAB
BUN: 85 mg/dL — ABNORMAL HIGH (ref 6–23)
Chloride: 110 meq/L (ref 96–112)
Potassium: 3.8 meq/L (ref 3.5–5.3)
Sodium: 141 meq/L (ref 135–145)

## 2009-06-17 ENCOUNTER — Encounter: Payer: Self-pay | Admitting: Family Medicine

## 2009-06-24 ENCOUNTER — Ambulatory Visit: Payer: Self-pay | Admitting: Family Medicine

## 2009-06-30 ENCOUNTER — Telehealth: Payer: Self-pay | Admitting: Family Medicine

## 2009-07-01 ENCOUNTER — Encounter: Payer: Self-pay | Admitting: Family Medicine

## 2009-07-02 ENCOUNTER — Encounter: Payer: Self-pay | Admitting: Family Medicine

## 2009-07-02 ENCOUNTER — Ambulatory Visit: Payer: Self-pay | Admitting: Family Medicine

## 2009-07-02 LAB — CONVERTED CEMR LAB
Alkaline Phosphatase: 131 units/L — ABNORMAL HIGH (ref 39–117)
BUN: 66 mg/dL — ABNORMAL HIGH (ref 6–23)
CO2: 17 meq/L — ABNORMAL LOW (ref 19–32)
Creatinine, Ser: 3.5 mg/dL — ABNORMAL HIGH (ref 0.40–1.20)
Glucose, Bld: 156 mg/dL — ABNORMAL HIGH (ref 70–99)
HCT: 36.7 % (ref 36.0–46.0)
Hemoglobin: 12.1 g/dL (ref 12.0–15.0)
MCHC: 33 g/dL (ref 30.0–36.0)
MCV: 97.1 fL (ref 78.0–100.0)
RBC: 3.78 M/uL — ABNORMAL LOW (ref 3.87–5.11)
Total Bilirubin: 0.4 mg/dL (ref 0.3–1.2)
Uric Acid, Serum: 6.5 mg/dL (ref 2.4–7.0)

## 2009-07-05 ENCOUNTER — Encounter: Payer: Self-pay | Admitting: Family Medicine

## 2009-07-07 ENCOUNTER — Ambulatory Visit: Payer: Self-pay | Admitting: Family Medicine

## 2009-07-07 ENCOUNTER — Telehealth: Payer: Self-pay | Admitting: Family Medicine

## 2009-07-07 ENCOUNTER — Encounter: Payer: Self-pay | Admitting: Family Medicine

## 2009-07-12 ENCOUNTER — Telehealth: Payer: Self-pay | Admitting: *Deleted

## 2009-07-13 LAB — CONVERTED CEMR LAB
ALT: 28 units/L (ref 0–35)
AST: 43 units/L — ABNORMAL HIGH (ref 0–37)
Albumin: 3.8 g/dL (ref 3.5–5.2)
BUN: 65 mg/dL — ABNORMAL HIGH (ref 6–23)
CO2: 16 meq/L — ABNORMAL LOW (ref 19–32)
Calcium: 7.2 mg/dL — ABNORMAL LOW (ref 8.4–10.5)
Chloride: 110 meq/L (ref 96–112)
Creatinine, Ser: 3.3 mg/dL — ABNORMAL HIGH (ref 0.40–1.20)
Eosinophils Absolute: 2.4 10*3/uL — ABNORMAL HIGH (ref 0.0–0.7)
Eosinophils Relative: 21 % — ABNORMAL HIGH (ref 0–5)
HCT: 32.2 % — ABNORMAL LOW (ref 36.0–46.0)
Lymphocytes Relative: 12 % (ref 12–46)
Lymphs Abs: 1.4 10*3/uL (ref 0.7–4.0)
MCV: 94.7 fL (ref 78.0–100.0)
Monocytes Relative: 7 % (ref 3–12)
Potassium: 3.8 meq/L (ref 3.5–5.3)
RBC: 3.4 M/uL — ABNORMAL LOW (ref 3.87–5.11)
WBC: 11.5 10*3/uL — ABNORMAL HIGH (ref 4.0–10.5)

## 2009-07-15 ENCOUNTER — Encounter: Payer: Self-pay | Admitting: Family Medicine

## 2009-07-21 ENCOUNTER — Encounter: Payer: Self-pay | Admitting: Family Medicine

## 2009-07-22 ENCOUNTER — Encounter: Payer: Self-pay | Admitting: Family Medicine

## 2009-07-22 LAB — CONVERTED CEMR LAB
Albumin: 4.2 g/dL
Chloride: 106 meq/L
Creatinine, Ser: 2.69 mg/dL
Hemoglobin: 11.5 g/dL
Phosphorus: 3.9 mg/dL

## 2009-07-26 ENCOUNTER — Encounter: Payer: Self-pay | Admitting: Family Medicine

## 2009-07-29 ENCOUNTER — Ambulatory Visit: Payer: Self-pay | Admitting: Family Medicine

## 2009-07-29 LAB — CONVERTED CEMR LAB: Hgb A1c MFr Bld: 6 %

## 2009-07-30 ENCOUNTER — Encounter: Payer: Self-pay | Admitting: *Deleted

## 2009-08-26 ENCOUNTER — Encounter: Payer: Self-pay | Admitting: Family Medicine

## 2009-08-26 ENCOUNTER — Ambulatory Visit: Payer: Self-pay | Admitting: Family Medicine

## 2009-08-26 LAB — CONVERTED CEMR LAB
CO2: 21 meq/L (ref 19–32)
Calcium: 9.3 mg/dL (ref 8.4–10.5)
Creatinine, Ser: 2.71 mg/dL — ABNORMAL HIGH (ref 0.40–1.20)
Sodium: 140 meq/L (ref 135–145)
Uric Acid, Serum: 6 mg/dL (ref 2.4–7.0)

## 2009-09-09 ENCOUNTER — Ambulatory Visit: Payer: Self-pay | Admitting: Family Medicine

## 2009-09-09 ENCOUNTER — Telehealth: Payer: Self-pay | Admitting: Family Medicine

## 2009-09-09 LAB — CONVERTED CEMR LAB
Bilirubin Urine: NEGATIVE
Glucose, Urine, Semiquant: NEGATIVE
Ketones, urine, test strip: NEGATIVE
Nitrite: NEGATIVE
Protein, U semiquant: 100
Specific Gravity, Urine: 1.01

## 2009-09-10 ENCOUNTER — Encounter: Payer: Self-pay | Admitting: Family Medicine

## 2009-09-13 ENCOUNTER — Telehealth: Payer: Self-pay | Admitting: *Deleted

## 2009-09-23 ENCOUNTER — Ambulatory Visit: Payer: Self-pay | Admitting: Family Medicine

## 2009-10-12 ENCOUNTER — Encounter: Payer: Self-pay | Admitting: Family Medicine

## 2009-10-27 ENCOUNTER — Encounter: Payer: Self-pay | Admitting: Family Medicine

## 2009-11-03 ENCOUNTER — Telehealth: Payer: Self-pay | Admitting: Family Medicine

## 2009-11-19 ENCOUNTER — Encounter: Payer: Self-pay | Admitting: Family Medicine

## 2009-11-19 ENCOUNTER — Ambulatory Visit: Payer: Self-pay | Admitting: Family Medicine

## 2009-11-19 LAB — CONVERTED CEMR LAB: Hgb A1c MFr Bld: 6.5 %

## 2009-11-23 LAB — CONVERTED CEMR LAB
BUN: 64 mg/dL — ABNORMAL HIGH (ref 6–23)
CO2: 21 meq/L (ref 19–32)
Calcium: 9 mg/dL (ref 8.4–10.5)
Chloride: 102 meq/L (ref 96–112)
Creatinine, Ser: 2.37 mg/dL — ABNORMAL HIGH (ref 0.40–1.20)
Glucose, Bld: 127 mg/dL — ABNORMAL HIGH (ref 70–99)
Total Bilirubin: 0.3 mg/dL (ref 0.3–1.2)
Uric Acid, Serum: 6.1 mg/dL (ref 2.4–7.0)

## 2009-11-25 ENCOUNTER — Encounter: Payer: Self-pay | Admitting: Family Medicine

## 2009-12-04 DIAGNOSIS — R933 Abnormal findings on diagnostic imaging of other parts of digestive tract: Secondary | ICD-10-CM

## 2009-12-04 HISTORY — DX: Abnormal findings on diagnostic imaging of other parts of digestive tract: R93.3

## 2009-12-06 ENCOUNTER — Telehealth: Payer: Self-pay | Admitting: *Deleted

## 2009-12-09 ENCOUNTER — Ambulatory Visit: Payer: Self-pay | Admitting: Family Medicine

## 2009-12-09 DIAGNOSIS — H547 Unspecified visual loss: Secondary | ICD-10-CM

## 2009-12-23 ENCOUNTER — Telehealth: Payer: Self-pay | Admitting: *Deleted

## 2009-12-24 ENCOUNTER — Ambulatory Visit: Payer: Self-pay | Admitting: Family Medicine

## 2009-12-27 ENCOUNTER — Telehealth: Payer: Self-pay | Admitting: Family Medicine

## 2009-12-27 ENCOUNTER — Encounter: Payer: Self-pay | Admitting: Family Medicine

## 2009-12-28 ENCOUNTER — Telehealth: Payer: Self-pay | Admitting: Family Medicine

## 2010-01-03 ENCOUNTER — Encounter: Payer: Self-pay | Admitting: Family Medicine

## 2010-01-03 ENCOUNTER — Telehealth: Payer: Self-pay | Admitting: Family Medicine

## 2010-01-03 ENCOUNTER — Ambulatory Visit: Payer: Self-pay | Admitting: Family Medicine

## 2010-01-04 LAB — CONVERTED CEMR LAB
CO2: 19 meq/L (ref 19–32)
Chloride: 110 meq/L (ref 96–112)
Creatinine, Ser: 2.38 mg/dL — ABNORMAL HIGH (ref 0.40–1.20)
Sodium: 142 meq/L (ref 135–145)

## 2010-01-07 ENCOUNTER — Telehealth: Payer: Self-pay | Admitting: Family Medicine

## 2010-01-20 ENCOUNTER — Ambulatory Visit: Payer: Self-pay | Admitting: Family Medicine

## 2010-01-31 ENCOUNTER — Telehealth: Payer: Self-pay | Admitting: Family Medicine

## 2010-01-31 ENCOUNTER — Encounter: Payer: Self-pay | Admitting: Family Medicine

## 2010-01-31 ENCOUNTER — Ambulatory Visit: Payer: Self-pay | Admitting: Family Medicine

## 2010-01-31 LAB — CONVERTED CEMR LAB
Alkaline Phosphatase: 76 units/L (ref 39–117)
Bilirubin Urine: NEGATIVE
CO2: 26 meq/L (ref 19–32)
Creatinine, Ser: 2.91 mg/dL — ABNORMAL HIGH (ref 0.40–1.20)
Glucose, Bld: 132 mg/dL — ABNORMAL HIGH (ref 70–99)
Glucose, Urine, Semiquant: NEGATIVE
Ketones, urine, test strip: NEGATIVE
Protein, U semiquant: 30
Sodium: 140 meq/L (ref 135–145)
Specific Gravity, Urine: 1.015
Total Bilirubin: 0.3 mg/dL (ref 0.3–1.2)
WBC Urine, dipstick: NEGATIVE

## 2010-02-01 ENCOUNTER — Encounter: Payer: Self-pay | Admitting: Family Medicine

## 2010-02-07 ENCOUNTER — Ambulatory Visit: Payer: Self-pay | Admitting: Family Medicine

## 2010-02-07 ENCOUNTER — Encounter: Payer: Self-pay | Admitting: Family Medicine

## 2010-02-07 DIAGNOSIS — R197 Diarrhea, unspecified: Secondary | ICD-10-CM

## 2010-02-07 LAB — CONVERTED CEMR LAB
ALT: 22 units/L (ref 0–35)
BUN: 48 mg/dL — ABNORMAL HIGH (ref 6–23)
CO2: 19 meq/L (ref 19–32)
Chloride: 108 meq/L (ref 96–112)
Glucose, Bld: 56 mg/dL — ABNORMAL LOW (ref 70–99)
Potassium: 4.6 meq/L (ref 3.5–5.3)
Sodium: 139 meq/L (ref 135–145)
Total CHOL/HDL Ratio: 3
VLDL: 26 mg/dL (ref 0–40)

## 2010-02-08 ENCOUNTER — Telehealth: Payer: Self-pay | Admitting: Family Medicine

## 2010-02-17 ENCOUNTER — Ambulatory Visit: Payer: Self-pay | Admitting: Family Medicine

## 2010-02-17 ENCOUNTER — Encounter: Payer: Self-pay | Admitting: *Deleted

## 2010-02-17 LAB — CONVERTED CEMR LAB: Hgb A1c MFr Bld: 5.7 %

## 2010-02-21 ENCOUNTER — Encounter: Payer: Self-pay | Admitting: Family Medicine

## 2010-02-22 ENCOUNTER — Encounter: Payer: Self-pay | Admitting: Family Medicine

## 2010-02-25 ENCOUNTER — Encounter: Payer: Self-pay | Admitting: Family Medicine

## 2010-03-02 ENCOUNTER — Telehealth: Payer: Self-pay | Admitting: Family Medicine

## 2010-03-04 ENCOUNTER — Ambulatory Visit: Payer: Self-pay | Admitting: Family Medicine

## 2010-03-04 DIAGNOSIS — I872 Venous insufficiency (chronic) (peripheral): Secondary | ICD-10-CM | POA: Insufficient documentation

## 2010-03-10 ENCOUNTER — Encounter: Payer: Self-pay | Admitting: Family Medicine

## 2010-03-10 ENCOUNTER — Ambulatory Visit: Payer: Self-pay | Admitting: Family Medicine

## 2010-03-10 DIAGNOSIS — F458 Other somatoform disorders: Secondary | ICD-10-CM

## 2010-03-10 LAB — CONVERTED CEMR LAB
Calcium: 9.1 mg/dL (ref 8.4–10.5)
Cholesterol: 142 mg/dL (ref 0–200)
Creatinine, Ser: 1.81 mg/dL — ABNORMAL HIGH (ref 0.40–1.20)
HDL: 35 mg/dL — ABNORMAL LOW (ref >39)
Sodium: 143 meq/L (ref 135–145)
Total CHOL/HDL Ratio: 4.1

## 2010-03-11 ENCOUNTER — Encounter: Payer: Self-pay | Admitting: Family Medicine

## 2010-04-07 ENCOUNTER — Ambulatory Visit: Payer: Self-pay | Admitting: Family Medicine

## 2010-04-07 ENCOUNTER — Encounter: Payer: Self-pay | Admitting: Family Medicine

## 2010-04-07 LAB — CONVERTED CEMR LAB
BUN: 48 mg/dL — ABNORMAL HIGH (ref 6–23)
CO2: 19 meq/L (ref 19–32)
Calcium: 8.5 mg/dL (ref 8.4–10.5)
Glucose, Bld: 98 mg/dL (ref 70–99)
Potassium: 4.1 meq/L (ref 3.5–5.3)

## 2010-04-12 ENCOUNTER — Ambulatory Visit: Payer: Self-pay | Admitting: Family Medicine

## 2010-04-12 ENCOUNTER — Encounter: Payer: Self-pay | Admitting: Family Medicine

## 2010-04-12 ENCOUNTER — Telehealth: Payer: Self-pay | Admitting: Family Medicine

## 2010-04-12 LAB — CONVERTED CEMR LAB
Calcium: 8.8 mg/dL (ref 8.4–10.5)
Glucose, Bld: 137 mg/dL — ABNORMAL HIGH (ref 70–99)
Glucose, Urine, Semiquant: NEGATIVE
Nitrite: NEGATIVE
Potassium: 4.8 meq/L (ref 3.5–5.3)
Protein, U semiquant: 100
Sodium: 141 meq/L (ref 135–145)
WBC Urine, dipstick: NEGATIVE
pH: 5.5

## 2010-04-13 ENCOUNTER — Encounter: Payer: Self-pay | Admitting: Family Medicine

## 2010-05-10 ENCOUNTER — Encounter: Payer: Self-pay | Admitting: Family Medicine

## 2010-05-11 ENCOUNTER — Encounter: Payer: Self-pay | Admitting: Family Medicine

## 2010-05-12 ENCOUNTER — Ambulatory Visit: Payer: Self-pay | Admitting: Family Medicine

## 2010-05-12 LAB — CONVERTED CEMR LAB
BUN: 48 mg/dL
CO2: 23 meq/L
Chloride: 110 meq/L
Potassium: 5.1 meq/L
Sodium: 141 meq/L
Uric Acid, Serum: 5.9 mg/dL

## 2010-05-13 ENCOUNTER — Encounter: Payer: Self-pay | Admitting: Family Medicine

## 2010-05-17 ENCOUNTER — Encounter: Payer: Self-pay | Admitting: Family Medicine

## 2010-05-17 ENCOUNTER — Telehealth: Payer: Self-pay | Admitting: Family Medicine

## 2010-05-18 ENCOUNTER — Telehealth (INDEPENDENT_AMBULATORY_CARE_PROVIDER_SITE_OTHER): Payer: Self-pay | Admitting: *Deleted

## 2010-05-18 ENCOUNTER — Encounter: Payer: Self-pay | Admitting: Family Medicine

## 2010-05-18 DIAGNOSIS — R928 Other abnormal and inconclusive findings on diagnostic imaging of breast: Secondary | ICD-10-CM | POA: Insufficient documentation

## 2010-05-19 ENCOUNTER — Encounter: Payer: Self-pay | Admitting: Family Medicine

## 2010-05-31 ENCOUNTER — Ambulatory Visit: Payer: Self-pay | Admitting: Family Medicine

## 2010-06-03 HISTORY — PX: MINOR BREAST BIOPSY: SHX5977

## 2010-06-09 ENCOUNTER — Telehealth: Payer: Self-pay | Admitting: Family Medicine

## 2010-06-09 ENCOUNTER — Encounter: Payer: Self-pay | Admitting: Family Medicine

## 2010-06-21 ENCOUNTER — Ambulatory Visit: Payer: Self-pay | Admitting: Family Medicine

## 2010-07-19 ENCOUNTER — Ambulatory Visit: Payer: Self-pay | Admitting: Family Medicine

## 2010-07-21 ENCOUNTER — Encounter: Payer: Self-pay | Admitting: Family Medicine

## 2010-07-22 ENCOUNTER — Encounter: Payer: Self-pay | Admitting: Family Medicine

## 2010-07-26 ENCOUNTER — Telehealth: Payer: Self-pay | Admitting: Family Medicine

## 2010-07-28 ENCOUNTER — Ambulatory Visit (HOSPITAL_COMMUNITY): Admission: RE | Admit: 2010-07-28 | Discharge: 2010-07-28 | Payer: Self-pay | Admitting: Family Medicine

## 2010-07-28 ENCOUNTER — Encounter: Payer: Self-pay | Admitting: Family Medicine

## 2010-07-28 ENCOUNTER — Telehealth: Payer: Self-pay | Admitting: Family Medicine

## 2010-07-28 ENCOUNTER — Ambulatory Visit: Payer: Self-pay | Admitting: Family Medicine

## 2010-08-01 ENCOUNTER — Encounter: Payer: Self-pay | Admitting: Family Medicine

## 2010-08-05 DIAGNOSIS — H353290 Exudative age-related macular degeneration, unspecified eye, stage unspecified: Secondary | ICD-10-CM

## 2010-08-09 ENCOUNTER — Telehealth: Payer: Self-pay | Admitting: Family Medicine

## 2010-08-10 ENCOUNTER — Ambulatory Visit (HOSPITAL_COMMUNITY): Admission: RE | Admit: 2010-08-10 | Discharge: 2010-08-10 | Payer: Self-pay | Admitting: Gastroenterology

## 2010-08-16 ENCOUNTER — Ambulatory Visit: Payer: Self-pay | Admitting: Family Medicine

## 2010-08-29 ENCOUNTER — Encounter: Payer: Self-pay | Admitting: Family Medicine

## 2010-09-13 ENCOUNTER — Ambulatory Visit: Payer: Self-pay | Admitting: Family Medicine

## 2010-09-13 LAB — CONVERTED CEMR LAB: Hgb A1c MFr Bld: 6.7 %

## 2010-09-25 ENCOUNTER — Encounter: Payer: Self-pay | Admitting: Family Medicine

## 2010-09-25 DIAGNOSIS — J45909 Unspecified asthma, uncomplicated: Secondary | ICD-10-CM | POA: Insufficient documentation

## 2010-11-11 LAB — CONVERTED CEMR LAB
Glucose, Urine, Semiquant: 204
Sodium: 142 meq/L

## 2010-11-15 ENCOUNTER — Ambulatory Visit: Payer: Self-pay | Admitting: Family Medicine

## 2010-11-15 ENCOUNTER — Encounter
Admission: RE | Admit: 2010-11-15 | Discharge: 2010-11-15 | Payer: Self-pay | Source: Home / Self Care | Attending: Sports Medicine | Admitting: Sports Medicine

## 2010-11-25 ENCOUNTER — Encounter: Payer: Self-pay | Admitting: Family Medicine

## 2010-12-20 ENCOUNTER — Ambulatory Visit: Admission: RE | Admit: 2010-12-20 | Discharge: 2010-12-20 | Payer: Self-pay | Source: Home / Self Care

## 2010-12-20 ENCOUNTER — Encounter: Payer: Self-pay | Admitting: Family Medicine

## 2010-12-20 DIAGNOSIS — R5381 Other malaise: Secondary | ICD-10-CM | POA: Insufficient documentation

## 2010-12-21 ENCOUNTER — Encounter: Payer: Self-pay | Admitting: *Deleted

## 2010-12-21 ENCOUNTER — Telehealth: Payer: Self-pay | Admitting: *Deleted

## 2010-12-21 LAB — CONVERTED CEMR LAB
BUN: 47 mg/dL — ABNORMAL HIGH (ref 6–23)
CO2: 22 meq/L (ref 19–32)
Calcium: 8.8 mg/dL (ref 8.4–10.5)
Chloride: 109 meq/L (ref 96–112)
Creatinine, Ser: 3.24 mg/dL — ABNORMAL HIGH (ref 0.40–1.20)
Glucose, Bld: 172 mg/dL — ABNORMAL HIGH (ref 70–99)
HCT: 30.2 % — ABNORMAL LOW (ref 36.0–46.0)
Hemoglobin: 10.2 g/dL — ABNORMAL LOW (ref 12.0–15.0)
MCHC: 33.8 g/dL (ref 30.0–36.0)
MCV: 93.5 fL (ref 78.0–100.0)
Platelets: 199 10*3/uL (ref 150–400)
Potassium: 4.2 meq/L (ref 3.5–5.3)
RBC: 3.23 M/uL — ABNORMAL LOW (ref 3.87–5.11)
RDW: 14.1 % (ref 11.5–15.5)
Sodium: 142 meq/L (ref 135–145)
TSH: 3.056 microintl units/mL (ref 0.350–4.500)
Total CK: 82 units/L (ref 7–177)
WBC: 9 10*3/uL (ref 4.0–10.5)

## 2010-12-22 ENCOUNTER — Ambulatory Visit: Admission: RE | Admit: 2010-12-22 | Discharge: 2010-12-22 | Payer: Self-pay | Source: Home / Self Care

## 2010-12-22 DIAGNOSIS — L989 Disorder of the skin and subcutaneous tissue, unspecified: Secondary | ICD-10-CM | POA: Insufficient documentation

## 2010-12-23 ENCOUNTER — Encounter: Payer: Self-pay | Admitting: Family Medicine

## 2010-12-25 ENCOUNTER — Encounter: Payer: Self-pay | Admitting: Family Medicine

## 2010-12-26 ENCOUNTER — Ambulatory Visit: Admission: RE | Admit: 2010-12-26 | Discharge: 2010-12-26 | Payer: Self-pay | Source: Home / Self Care

## 2010-12-26 ENCOUNTER — Encounter
Admission: RE | Admit: 2010-12-26 | Discharge: 2010-12-26 | Payer: Self-pay | Source: Home / Self Care | Attending: Gastroenterology | Admitting: Gastroenterology

## 2010-12-26 ENCOUNTER — Encounter: Payer: Self-pay | Admitting: Family Medicine

## 2010-12-26 LAB — CONVERTED CEMR LAB
BUN: 39 mg/dL — ABNORMAL HIGH (ref 6–23)
Calcium: 9.5 mg/dL (ref 8.4–10.5)
Creatinine, Ser: 2.25 mg/dL — ABNORMAL HIGH (ref 0.40–1.20)

## 2010-12-27 ENCOUNTER — Encounter: Payer: Self-pay | Admitting: Family Medicine

## 2010-12-30 ENCOUNTER — Telehealth: Payer: Self-pay | Admitting: Family Medicine

## 2011-01-01 LAB — CONVERTED CEMR LAB
AST: 28 units/L (ref 0–37)
Albumin: 4.1 g/dL (ref 3.5–5.2)
BUN: 46 mg/dL — ABNORMAL HIGH (ref 6–23)
CO2: 18 meq/L — ABNORMAL LOW (ref 19–32)
Calcium: 9.1 mg/dL (ref 8.4–10.5)
Chloride: 109 meq/L (ref 96–112)
Creatinine, Ser: 2.52 mg/dL — ABNORMAL HIGH (ref 0.40–1.20)
Glucose, Bld: 109 mg/dL — ABNORMAL HIGH (ref 70–99)
Potassium: 5 meq/L (ref 3.5–5.3)

## 2011-01-03 ENCOUNTER — Encounter
Admission: RE | Admit: 2011-01-03 | Discharge: 2011-01-03 | Payer: Self-pay | Source: Home / Self Care | Attending: Gastroenterology | Admitting: Gastroenterology

## 2011-01-04 ENCOUNTER — Encounter: Payer: Self-pay | Admitting: Family Medicine

## 2011-01-05 ENCOUNTER — Ambulatory Visit: Admit: 2011-01-05 | Payer: Self-pay

## 2011-01-05 ENCOUNTER — Ambulatory Visit: Payer: Self-pay

## 2011-01-05 NOTE — Progress Notes (Signed)
Summary: Deferring bx of R breast calcifications.   Phone Note Call from Patient   Caller: Patient Call For: Zachery Dauer MD Summary of Call: Please call asap.  Scheduled to have a biopsy, need to call cause she want to talk you about it first.  [phone] Initial call taken by: Britta Mccreedy mcgregor  Follow-up for Phone Call        She will put off having the area of new right breast calcifications until after her renal tests are completed. The radiologist had indicated that they could be just rechecked in 6 months vs biopsy. I agreed with her plan Follow-up by: Zachery Dauer MD,  May 17, 2010 12:20 PM

## 2011-01-05 NOTE — Assessment & Plan Note (Signed)
Summary: F/U EO   Vital Signs:  Patient profile:   77 year old female Menstrual status:  hysterectomy Height:      59.75 inches Weight:      140 pounds BP sitting:   160 / 80  (right arm) Cuff size:   regular  Vitals Entered By: Tessie Fass CMA (December 22, 2010 9:39 AM)  Primary Care Provider:  Zachery Dauer MD   History of Present Illness: 2 days follow up after a fall on 12/16/10 in which she could not get up for 12 hours.  She describes dizziness, falling when she went to get a glass of water, inability to get up, and it took time to crawl to the phone in order to contact his family.  She aches all over but this has improved.  Normal CK.  However, one full point bump in serum creatitine, she is not due to see renal for 3 months.  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  This has been changing over the past months and after being on the floor is particularly sore.  She is very confused about her new mail order scripts, today she is out of isosorbide for several days.  Her voice is very hoarse, she has constant drainage, she has not been using the nasal steroid prescribed. She is having a hard time swallowing and has a GI apt tomorrow.  Allergies: 1)  ! Novocain 2)  ! Doxycycline Hyclate (Doxycycline Hyclate) 3)  * Clonidine 4)  Codeine 5)  Benadryl 6)  * Tavist 7)  Cipro 8)  Neurontin 9)  Septra Ds (Sulfamethoxazole-Trimethoprim)  Physical Exam  General:  Anxious, thinner appearing.  Moved around well. Ears:  TM retracted Mouth:  milky post nasal drainage and posterior lymphoid hypertrophy.   Lungs:  normal respiratory effort and normal breath sounds.   Heart:  normal rate and regular rhythm.   Skin:  sacral area (site of previous basal cell) with elevated pale lesions and surrounding errythemia. Psych:  moderately anxious.     Impression & Recommendations:  Problem # 1:  SKIN LESION (ICD-709.9) Retrurn to Derm for biopsy and possible  resection, appeard to be recurring lesions that were suspicious. Orders: Dermatology Referral (Derma) [facility]- Est  Level 4 (81191)  Problem # 2:  CHRONIC KIDNEY DISEASE STAGE III (MODERATE) (ICD-585.3)  One full bump in serum creatitine to 3.24, recheck on 12/26/10, suspect acute dehydration from falling and being down likely contributing factor, if not back to baseline will make arrangements for her to see renal sooner.  Orders: [facility]- Est  Level 4 (99214)  Problem # 3:  GASTROESOPHAGEAL REFLUX, NO ESOPHAGITIS (ICD-530.81)  Apt with GI tomorrow Her updated medication list for this problem includes:    Protonix 40 Mg Tbec (Pantoprazole sodium) .Marland Kitchen... Take one tab daily  Orders: [facility]- Est  Level 4 (47829)  Problem # 4:  RHINITIS, ALLERGIC (ICD-477.9)  restart nasal steroid. Her updated medication list for this problem includes:    Flonase 50 Mcg/act Susp (Fluticasone propionate) .Marland Kitchen..Marland Kitchen Two squirts in each nostril daily    Cetirizine Hcl 10 Mg Tabs (Cetirizine hcl) ..... Otc one qhs  Orders: [facility]- Est  Level 4 (56213)  Problem # 5:  ACCIDENTAL FALL (ICD-E888.9) Improving, prefers chiopracter to physical therapy.  She told PT when they called that she was not able.  Problem # 6:  HYPERTENSION (ICD-401.9) Nifedipine was placed on hold 2 days ago, will check BP again next week and  consult with primary MD regarding restarting. Her updated medication list for this problem includes:    Metoprolol Succinate 50 Mg Tb24 (Metoprolol succinate) .Marland Kitchen... Take 1 tablet by mouth once a day    Nifedipine 60 Mg Xr24h-tab (Nifedipine) .Marland Kitchen... Take one tablet daily  Future Orders: Basic Met-[facility] ([id number]) ... 12/08/2011  Complete Medication List: 1)  Plavix 75 Mg Tabs (Clopidogrel bisulfate) .... Take one tablet daily 2)  Proair Hfa 108 (90 Base) Mcg/act Aers (Albuterol sulfate) .... Take 2 puffs q4h prn 3)  Buspirone Hcl 15 Mg Tabs (Buspirone hcl) .... Take 1 tablet by mouth twice a day 4)  Ecotrin  Low Strength 81 Mg Tbec (Aspirin) .... Take 1 tablet by mouth once a day 5)  Metoprolol Succinate 50 Mg Tb24 (Metoprolol succinate) .... Take 1 tablet by mouth once a day 6)  Simvastatin 40 Mg Tabs (Simvastatin) .... One daily 7)  Vitamin B-6 100 Mg Tabs (Pyridoxine hcl) .... Take one tablet daily 8)  Allopurinol 100 Mg Tabs (Allopurinol) .Marland Kitchen.. 1 daily 9)  Nifedipine 60 Mg Xr24h-tab (Nifedipine) .... Take one tablet daily 10)  Triamcinolone Acetonide 0.1 % Oint (Triamcinolone acetonide) .... Use 1-2 times a day for leg rash 11)  Calcitriol 0.5 Mcg Caps (Calcitriol) .... Take one tab 3 times weekly 12)  Flonase 50 Mcg/act Susp (Fluticasone propionate) .... Two squirts in each nostril daily 13)  Polyethylene Glycol 3350 Powd (Polyethylene glycol 3350) .Marland KitchenMarland Kitchen. 17 gm in 4 oz of liquid daily, qs for one month 14)  Loperamide Hcl 2 Mg Caps (Loperamide hcl) .... Take 3 then one after each loose bowel movement 15)  Cetirizine Hcl 10 Mg Tabs (Cetirizine hcl) .... Otc one qhs 16)  Isosorbide Mononitrate Cr 30 Mg Xr24h-tab (Isosorbide mononitrate) .... One q morning 17)  Nitrostat 0.4 Mg Subl (Nitroglycerin) .... One under tongue as needed for chest pain, may repeat in 5 minutes twice but if still having problems access emergency care, 1 bottle 18)  Bayer Contour Test Strp (Glucose blood) .... Test fasting and when symptoms of low blood sugar 19)  Micatin 2 % Crea (Miconazole nitrate) .... Apply two times a day 20)  Metamucil 30.9 % Powd (Psyllium) .... One tbsp in glass of water daily 21)  Tramadol Hcl 50 Mg Tabs (Tramadol hcl) .... Take one tab every 6 hours as needed 22)  Acetaminophen 325 Mg Tabs (Acetaminophen) .... Take 2 tabs every 6 hr as needed pain. 23)  Protonix 40 Mg Tbec (Pantoprazole sodium) .... Take one tab daily  Patient Instructions: 1)  [facility] nasal spray daily and isosorbide 2)  Call the mail order for the 3 month supply of the meds that are checked. 3)  Return on Monday for  BP check and labs 4)  Return to see Dr. Sheffield Slider or Bo Merino  in 2 weeks Prescriptions: ISOSORBIDE MONONITRATE CR 30 MG XR24H-TAB (ISOSORBIDE MONONITRATE) one q morning  #30 x 0   Entered and Authorized by:   Zachery Dauer MD   Signed by:   Zachery Dauer MD on 12/22/2010   Method used:   Electronically to        [facility] [address] (retail)       [address]       [place], [place]  [id number]       Ph: [phone]       Fax: [phone]   RxID:   [id number] FLONASE 50 MCG/ACT SUSP (FLUTICASONE PROPIONATE) two squirts in each nostril daily  #  1 x 0   Entered and Authorized by:   Zachery Dauer MD   Signed by:   Zachery Dauer MD on 12/22/2010   Method used:   Electronically to        [facility] [address] [address] (retail)       [address]       [place], [place]  [id number]       Ph: [phone]       Fax: [phone]   RxID:   [id number]    Orders Added: 1)  Basic Met-[facility] [[id number]] 2)  Dermatology Referral [Derma] 3)  [facility]- Est  Level 4 [[address]387]

## 2011-01-05 NOTE — Progress Notes (Signed)
Summary: refill  Phone Note Call from Patient Call back at [phone]   Caller: Patient Summary of Call: needs refill on test strips for contour machine - would like handwritten so she can shop around until she can get the ones from [facility] Initial call taken by: De Nurse,  June 09, 2010 4:56 PM    New/Updated Medications: BAYER CONTOUR TEST  STRP (GLUCOSE BLOOD) Test fasting and when symptoms of low blood sugar Prescriptions: BAYER CONTOUR TEST  STRP (GLUCOSE BLOOD) Test fasting and when symptoms of low blood sugar  #1 bottle x 11   Entered and Authorized by:   Zachery Dauer MD   Signed by:   Zachery Dauer MD on 06/09/2010   Method used:   Print then Give to Patient   RxID:   [id number]  Printed and put in [employer]

## 2011-01-05 NOTE — Progress Notes (Signed)
Summary: refill  Phone Note Refill Request Call back at [phone] Message from:  Patient  Refills Requested: Medication #1:  PLAVIX 75 MG TABS Take one tablet daily pt has 2 left  Initial call taken by: De Nurse,  December 30, 2010 2:45 PM    Prescriptions: PLAVIX 75 MG TABS (CLOPIDOGREL BISULFATE) Take one tablet daily  #30 x 0   Entered and Authorized by:   Luretha Murphy NP   Signed by:   Luretha Murphy NP on 12/30/2010   Method used:   Electronically to        [facility] [facility] (retail)       [address]       [place], [place]  [id number]       Ph: [phone]       Fax: [phone]   RxID:   [id number]

## 2011-01-05 NOTE — Progress Notes (Signed)
Summary: Triage  Phone Note Call from Patient Call back at Home Phone [phone]   Reason for Call: Talk to Nurse Summary of Call: pt would like to speak with RN, just finished antibiotics and she said "its starting all over again" Has sore throat & chills Initial call taken by: Knox Royalty,  January 03, 2010 8:44 AM  Follow-up for Phone Call        c/o chills & feeling like sinus infection is not better. advised she needed appt. she agreed to see Saxon at 11am Follow-up by: Golden Circle RN,  January 03, 2010 8:48 AM

## 2011-01-05 NOTE — Miscellaneous (Signed)
Summary: Hold Lasix, labs 3/7  Clinical Lists Changes  Orders: Added new Test order of Basic Met-[facility] [phone]) - Signed Added new Test order of Lipid-[facility] ([id number]) - Signed

## 2011-01-05 NOTE — Progress Notes (Signed)
Summary: triage  Phone Note Call from Patient Call back at Home Phone [phone]   Caller: Patient Summary of Call: Pt legs are very swollen and breaking out. Initial call taken by: Clydell Hakim,  March 02, 2010 10:59 AM  Follow-up for Phone Call        states legs wre "humongous" somewhat better since.  saw Dr. Caryn Section on Friday. has been off lasix. went to foot doctor yesterday for new shoes. tolerated them x 3 hours. edema does not go down when sitting in recliner with legs up.  she does void well at night, but she does not sleep due to frequent bathroom trips.   has a few small sores on ankes. using an ointment on them. states she is going to take tylenol. drinks plenty of water per pt.  since she voids at night while laying down, suggested she lay down during the day to see if that will stimulate her to void during the day. states she never voids during the day. appt fri with Edrees Valent at 1:30. pt refuses to see anyone else than pcp or Ada [facility]bury told her if it gets worse call back. if she develops SOB, call 911 & go to ED Follow-up by: Golden Circle RN,  March 02, 2010 11:10 AM

## 2011-01-05 NOTE — Consult Note (Signed)
Summary: [facility]   Imported By: De Nurse 01/07/2010 17:17:49  _____________________________________________________________________  External Attachment:    Type:   Image     Comment:   External Document

## 2011-01-05 NOTE — Progress Notes (Signed)
Summary: Update on pt  Phone Note Call from Patient Call back at Home Phone [phone]   Reason for Call: Talk to Doctor Summary of Call: pt was calling to give MD an update, sts her bottom is a little better and wants to know if she should still come in to be seen? Initial call taken by: Knox Royalty,  December 28, 2009 8:26 AM  Follow-up for Phone Call        called Pt feeling better, but sinues still bothering her, sinus the worst, eyes and teeth even hurt from sinus issues,  bottom is doing a little better, one blister has popped open, wants you to call her in something if you can since she just saw you on Friday.  declined appt for tomorrow because weather is going to get bad.  What note sent to [facility] Follow-up by: Gladstone Pih,  December 28, 2009 1:47 PM    New/Updated Medications: FLONASE 50 MCG/ACT SUSP (FLUTICASONE PROPIONATE) two squirts in each nostril daily Prescriptions: FLONASE 50 MCG/ACT SUSP (FLUTICASONE PROPIONATE) two squirts in each nostril daily  #1 x 6   Entered and Authorized by:   Luretha Murphy NP   Signed by:   Luretha Murphy NP on 12/28/2009   Method used:   Electronically to        [facility] (retail)       [address]       [place], [place]  [address]       Ph: [phone]       Fax: [phone]   RxID:   [id number]

## 2011-01-05 NOTE — Assessment & Plan Note (Signed)
Summary: F/U EO   Vital Signs:  Patient profile:   77 year old female Menstrual status:  hysterectomy Height:      59.75 inches Weight:      144 pounds BMI:     28.46 Pulse rate:   60 / minute BP sitting:   111 / 60  (left arm) Cuff size:   regular  Vitals Entered By: Arlyss Repress CMA, (July 19, 2010 1:35 PM) CC: regular visit. c/o burping. uses NTG for CP Is Patient Diabetic? Yes Pain Assessment Patient in pain? no        Primary Care Marjon Doxtater:  Zachery Dauer MD  CC:  regular visit. c/o burping. uses NTG for CP.  History of Present Illness: Had her breast biopsy with benign results. Bruised.   Has to use one sublingual nitroglycerine weekly. Seems to need when chest filled with gas and is burping;  dyspnea on exertion after walking. No paroxysmal nocturnal dyspnea, but short of breath when going to sleep. Stable on 2 pillows. Legs swollen at bedtime.  Minimal cough. Went to [place] but couldn't go out in the boat. Nocturia awakens her.   Using Biofreeze on legs for pain reliever.   Sugars variable. Last Thursday in store had hypoglycemic episode. weakness improved after cake icing.  High of 397 but 119 at  3 minutes later. Can't remember lunch timing. Low of 106, most in 100's.  Habits & Providers  Alcohol-Tobacco-Diet     Tobacco Status: never  Current Medications (verified): 1)  Plavix 75 Mg Tabs (Clopidogrel Bisulfate) .... Take One Tablet Daily 2)  Albuterol 90 Mcg/act Aers (Albuterol) .... Inhale 2 Puff Using Inhaler Four Times A Day 3)  Buspirone Hcl 15 Mg Tabs (Buspirone Hcl) .... Take 1 Tablet By Mouth Twice A Day 4)  Ecotrin Low Strength 81 Mg Tbec (Aspirin) .... Take 1 Tablet By Mouth Once A Day 5)  Florastor  (Saccharomyces Boulardii) Caps .... Take 1 Packet By Mouth Twice A Day 6)  Metoprolol Succinate 50 Mg Tb24 (Metoprolol Succinate) .... Take 1 Tablet By Mouth Once A Day 7)  Simvastatin 40 Mg  Tabs (Simvastatin) .... One Daily 8)  Vitamin B-6  100 Mg Tabs (Pyridoxine Hcl) .... Take One Tablet Daily 9)  Allopurinol 100 Mg  Tabs (Allopurinol) .Marland Kitchen.. 1 Daily 10)  Omeprazole 20 Mg Cpdr (Omeprazole) .... Take One Tab Two Times A Day 11)  Tramadol-Acetaminophen 37.5-325 Mg Tabs (Tramadol-Acetaminophen) .... Take One Tablet Every 6 Hours As Needed Pain. 12)  Nifedipine 60 Mg Xr24h-Tab (Nifedipine) .... Take One Tablet Daily 13)  Diabetic Shoes .... Thick Nails, Callouses 14)  Triamcinolone Acetonide 0.1 % Oint (Triamcinolone Acetonide) .... Use 1-2 Times A Day For Leg Rash 15)  Calcitriol 0.5 Mcg Caps (Calcitriol) .... Take One Tab 3 Times Weekly 16)  Colcrys 0.6 Mg Tabs (Colchicine) .... Take One Tablet Daily As Needed For Gout 17)  Proair Hfa 108 (90 Base) Mcg/act Aers (Albuterol Sulfate) .... 2 Puffs Qid As Needed 18)  Flonase 50 Mcg/act Susp (Fluticasone Propionate) .... Two Squirts in Each Nostril Daily 19)  Polyethylene Glycol 3350  Powd (Polyethylene Glycol 3350) .Marland KitchenMarland KitchenMarland Kitchen 17 Gm in 4 Oz of Liquid Daily, Qs For One Month 20)  Loperamide Hcl 2 Mg Caps (Loperamide Hcl) .... Take 3 Then One After Each Loose Bowel Movement 21)  Cetirizine Hcl 10 Mg Tabs (Cetirizine Hcl) .... Otc One Qhs 22)  Isosorbide Mononitrate Cr 30 Mg Xr24h-Tab (Isosorbide Mononitrate) .... One Q Morning 23)  Nitrostat  0.4 Mg Subl (Nitroglycerin) .... One Under Tongue As Needed For Chest Pain, May Repeat in 5 Minutes Twice But If Still Having Problems Access Emergency Care, 1 Bottle 24)  Bayer Contour Test  Strp (Glucose Blood) .... Test Fasting and When Symptoms of Low Blood Sugar 25)  Micatin 2 % Crea (Miconazole Nitrate) .... Apply Two Times A Day  Allergies (verified): 1)  ! Novocain 2)  ! Doxycycline Hyclate (Doxycycline Hyclate) 3)  * Clonidine 4)  Codeine 5)  Benadryl 6)  * Tavist 7)  Cipro 8)  Neurontin 9)  Septra Ds (Sulfamethoxazole-Trimethoprim)  Physical Exam  Lungs:  normal respiratory effort, normal breath sounds, and no crackles.   Heart:  normal  rate and regular rhythm.   Extremities:  tr edema at ankles Skin:  mild scaling and erythema of inner pinnae Psych:  moderately anxious.     Impression & Recommendations:  Problem # 1:  AEROPHAGIA (ICD-306.4) She wishes a referral back to Dr Madilyn Fireman Orders: Gastroenterology Referral (GI) [facility]- Est  Level 4 ([address])  Problem # 2:  OTHER ABNORMAL FINDING RADIOLOGICAL EXAM BREAST (ICD-793.89) Benign biopsy by her report Orders: [facility]- Est  Level 4 (60454)  Problem # 3:  HYPERTENSION (ICD-401.9)  Her updated medication list for this problem includes:    Metoprolol Succinate 50 Mg Tb24 (Metoprolol succinate) .Marland Kitchen... Take 1 tablet by mouth once a day    Nifedipine 60 Mg Xr24h-tab (Nifedipine) .Marland Kitchen... 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 Take 1 tablet by mouth once a day  Orders: [facility]- Est  Level 4 (09811)  Problem # 5:  PAIN IN JOINT, HAND (ICD-719.44) No recurrence  Complete Medication List: 1)  Plavix 75 Mg Tabs (Clopidogrel bisulfate) .... Take one tablet daily 2)  Albuterol 90 Mcg/act Aers (Albuterol) .... Inhale 2 puff using inhaler four times a day 3)  Buspirone Hcl 15 Mg Tabs (Buspirone hcl) .... Take 1 tablet by mouth twice a day 4)  Ecotrin Low Strength 81 Mg Tbec (Aspirin) .... Take 1 tablet by mouth once a day 5)  Florastor (saccharomyces Boulardii) Caps  .... Take 1 packet by mouth twice a day 6)  Metoprolol Succinate 50 Mg Tb24 (Metoprolol succinate) .... Take 1 tablet by mouth once a day 7)  Simvastatin 40 Mg Tabs (Simvastatin) .... One daily 8)  Vitamin B-6 100 Mg Tabs (Pyridoxine hcl) .... Take one tablet daily 9)  Allopurinol 100 Mg Tabs (Allopurinol) .Marland Kitchen.. 1 daily 10)  Omeprazole 20 Mg Cpdr (Omeprazole) .... Take one tab two times a day 11)  Tramadol-acetaminophen 37.5-325 Mg Tabs (Tramadol-acetaminophen) .... Take  one tablet every 6 hours as needed pain. 12)  Nifedipine 60 Mg Xr24h-tab (Nifedipine) .... Take one tablet daily 13)  Diabetic Shoes  .... Thick nails, callouses 14)  Triamcinolone Acetonide 0.1 % Oint (Triamcinolone acetonide) .... Use 1-2 times a day for leg rash 15)  Calcitriol 0.5 Mcg Caps (Calcitriol) .... Take one tab 3 times weekly 16)  Colcrys 0.6 Mg Tabs (Colchicine) .... Take one tablet daily as needed for gout 17)  Proair Hfa 108 (90 Base) Mcg/act Aers (Albuterol sulfate) .... 2 puffs qid as needed 18)  Flonase 50 Mcg/act Susp (Fluticasone propionate) .... Two squirts in each nostril daily 19)  Polyethylene Glycol 3350 Powd (Polyethylene glycol 3350) .Marland KitchenMarland Kitchen. 17 gm in 4 oz of liquid daily, qs  for one month 20)  Loperamide Hcl 2 Mg Caps (Loperamide hcl) .... Take 3 then one after each loose bowel movement 21)  Cetirizine Hcl 10 Mg Tabs (Cetirizine hcl) .... Otc one qhs 22)  Isosorbide Mononitrate Cr 30 Mg Xr24h-tab (Isosorbide mononitrate) .... One q morning 23)  Nitrostat 0.4 Mg Subl (Nitroglycerin) .... One under tongue as needed for chest pain, may repeat in 5 minutes twice but if still having problems access emergency care, 1 bottle 24)  Bayer Contour Test Strp (Glucose blood) .... Test fasting and when symptoms of low blood sugar 25)  Micatin 2 % Crea (Miconazole nitrate) .... Apply two times a day  Other Orders: Dexa scan (Dexa scan)  Patient Instructions: 1)  Apply hydrocortisone 1% three times a day to dry skin on ears. 2)  Don't take sugar test within 2 hours after a meals. Fasting and before meals is the best time.   3)  Please schedule a follow-up appointment in 1 month.  Prescriptions: MICATIN 2 % CREA (MICONAZOLE NITRATE) Apply two times a day  #1 tube x 2   Entered and Authorized by:   Zachery Dauer MD   Signed by:   Zachery Dauer MD on 07/19/2010   Method used:   Electronically to        [facility] [facility] (retail)       [address]        [place], [place]  [id number]       Ph: [phone]       Fax: [phone]   RxID:   [id number] NITROSTAT 0.4 MG SUBL (NITROGLYCERIN) one under tongue as needed for chest pain, may repeat in 5 minutes twice but if still having problems access emergency care, 1 bottle  #1 bottle x 6   Entered and Authorized by:   Zachery Dauer MD   Signed by:   Zachery Dauer MD on 07/19/2010   Method used:   Electronically to        [facility] (retail)       [address]       [place], [place]  [id number]       Ph: [phone]       Fax: [phone]   RxID:   [id number]     Prevention & Chronic Care Immunizations   Influenza vaccine: Fluvax MCR  (09/09/2009)   Influenza vaccine due: 08/04/2010    Tetanus booster: 01/04/2005: Done.   Tetanus booster due: 01/04/2015    Pneumococcal vaccine: Done.  (08/05/1999)   Pneumococcal vaccine deferral: Not indicated  (10/19/2009)   Pneumococcal vaccine due: None    H. zoster vaccine: 10/04/2009: Zostavax  Colorectal Screening   Hemoccult: Done.  (02/01/2002)   Hemoccult due: Not Indicated    Colonoscopy: Done.  (02/01/2002)   Colonoscopy due: 02/02/2012  Other Screening   Pap smear: Done.  (01/04/2006)   Pap smear due: Not Indicated    Mammogram: Assessment: BIRADS 4. Location: [facility].    (05/12/2010)   Mammogram action/deferral: Recheck in 6 mos. to consider biopsy for low risk microcalcifications  (05/12/2010)   Mammogram due: 09/24/2009    DXA bone density scan: Done.  (10/04/2000)   DXA bone density action/deferral: Ordered  (07/29/2009)   DXA scan due: None    Smoking status: never  (07/19/2010)  Diabetes Mellitus   HgbA1C: 5.9  (05/31/2010)   Hemoglobin A1C due: Not Indicated    Eye exam: status post laser  (  06/14/2010)   Eye exam due: 07/09/2009    Foot exam: yes  (07/29/2009)   High risk foot: Not documented   Foot care education: Not documented   Foot exam due:  07/14/2009    Urine microalbumin/creatinine ratio: Not documented   Urine microalbumin/cr due: Not Indicated  Lipids   Total Cholesterol: 142  (03/10/2010)   LDL: 72  (03/10/2010)   LDL Direct: 88  (07/28/2008)   HDL: 35  (03/10/2010)   Triglycerides: 177  (03/10/2010)    SGOT (AST): 40  (02/07/2010)   SGPT (ALT): 22  (02/07/2010)   Alkaline phosphatase: 82  (02/07/2010)   Total bilirubin: 0.4  (02/07/2010)  Hypertension   Last Blood Pressure: 111 / 60  (07/19/2010)   Serum creatinine: 2.67  (05/12/2010)   Serum potassium 5.1  (05/12/2010)  Self-Management Support :   Personal Goals (by the next clinic visit) :     Personal A1C goal: 7  (07/29/2009)     Personal blood pressure goal: 130/80  (07/29/2009)     Personal LDL goal: 100  (07/29/2009)    Diabetes self-management support: Education handout  (07/29/2009)    Hypertension self-management support: Education handout  (07/29/2009)    Lipid self-management support: Not documented

## 2011-01-05 NOTE — Letter (Signed)
Summary: *Referral Letter  [facility]  [address]   [place], [place] [address]   Phone: [phone]  Fax: [phone]    12/09/2009  Thank you in advance for agreeing to see my patient:  Belinda [name] [address] 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) 9)      RETINOPATHY, DIABETIC, PROLIFERATIVE (ICD-362.02) 10)          VISUAL ACUITY, DECREASED, RIGHT EYE (ICD-369.9) 11)      NEUROPATHY, DIABETIC (ICD-250.60) 12)  CHRONIC KIDNEY DISEASE STAGE III (MODERATE) (ICD-585.3) 13)      ANEMIA IN CHRONIC KIDNEY DISEASE (ICD-285.21) 14)      NEPHRECTOMY, HX OF (ICD-V45.73) 15)      NEPHROLITHIASIS, HX OF (ICD-V13.01) 16)  ACUTE GOUTY ARTHROPATHY (ICD-274.01) 17)      PAIN IN JOINT, HAND (ICD-719.44) 18)      PSEUDOGOUT (ICD-275.49) 19)  ATHEROSCLEROTIC CARDIOVASCULAR DISEASE (ICD-429.2) 20)  URINARY INCONTINENCE, URGE (ICD-788.31) 21)  NEOPLASM OF UNCERTAIN BEHAVIOR OF SKIN (ICD-238.2) 22)  BASAL CELL CARCINOMA, HX OF (ICD-V10.83) 23)  LUMBAR SPINAL STENOSIS (ICD-724.02) 24)      UNSTEADY GAIT (ICD-781.2) 25)  HERNIA, SITE NOS W/O OBSTRUCTION/GANGRENE (ICD-553.9) 26)  GASTROESOPHAGEAL REFLUX, NO ESOPHAGITIS (ICD-530.81) 27)  ASTHMA (ICD-493.90) 28)  VARICOSE VEINS (ICD-454.9) 29)  RHINITIS, ALLERGIC (ICD-477.9) 30)  HYPERLIPIDEMIA (ICD-272.4) 31)  HEARING LOSS NOS OR DEAFNESS (ICD-389.9) 32)  DIVERTICULOSIS OF COLON (ICD-562.10) 33)  CATARACT (ICD-366.9)   Current Medications: 1)  PLAVIX 75 MG TABS (CLOPIDOGREL BISULFATE) Take one tablet daily 2)  ALBUTEROL 90 MCG/ACT AERS (ALBUTEROL) Inhale 2 puff using inhaler four times a day 3)   BUSPIRONE HCL 15 MG TABS (BUSPIRONE HCL) Take 1 tablet by mouth twice a day 4)  ECOTRIN LOW STRENGTH 81 MG TBEC (ASPIRIN) Take 1 tablet by mouth once a day 5)  * FLORASTOR  (SACCHAROMYCES BOULARDII) CAPS Take 1 packet by mouth twice a day 6)  METOPROLOL SUCCINATE 50 MG TB24 (METOPROLOL SUCCINATE) Take 1 tablet by mouth once a day 7)  SIMVASTATIN 40 MG  TABS (SIMVASTATIN) one daily 8)  LASIX 20 MG TABS (FUROSEMIDE) Take 2  tablets by mouth each A.M. 9)  VITAMIN B-6 100 MG TABS (PYRIDOXINE HCL) Take one tablet daily 10)  ALLOPURINOL 100 MG  TABS (ALLOPURINOL) 1.5 daily 11)  OMEPRAZOLE 20 MG CPDR (OMEPRAZOLE) Take one tab two times a day 12)  TRAMADOL-ACETAMINOPHEN 37.5-325 MG TABS (TRAMADOL-ACETAMINOPHEN) Take one tablet every 6 hours as needed pain. 13)  GLYBURIDE 5 MG TABS (GLYBURIDE) Take 1/2 tab two times a day 14)  NIFEDIPINE 60 MG XR24H-TAB (NIFEDIPINE) Take one tablet daily 15)  ZOSTAVAX 28315 UNT/0.65ML SOLR (ZOSTER VACCINE LIVE) dispense and inject x 1 16)  * DIABETIC SHOES thick nails, callouses [BMN] 17)  JANUVIA 100 MG TABS (SITAGLIPTIN PHOSPHATE) Take 1/4 tab daily 18)  TRIAMCINOLONE ACETONIDE 0.1 % OINT (TRIAMCINOLONE ACETONIDE) Use 1-2 times a day for leg rash 19)  MUPIROCIN 2 % OINT (MUPIROCIN) apply to sores in nose, 22 gm 20)  CALCITRIOL 0.5 MCG CAPS (CALCITRIOL) Take one tab 3 times weekly 21)  COLCRYS 0.6 MG TABS (COLCHICINE) Take one tablet daily as directed   Past Medical History: 1)  EGD antral polyp  benign - 03/04/2002 2)  diabetic reinopathy 3)  CT -Lumbar Spine-, CT pelvis-negative - 01/01/2001 4)  spinal stenosis L4,5 L3,4 - 01/01/2001 5)  colonoscopy int hem - 03/04/2002  6)  MRI of back spinal stenosis - 05/21/2003  7)  CT - Abdominal & pelvic - 02/02/2004 8)  CT sinuses neg Dr Christella Hartigan - 02/09/2004 9)  L paraspinal hernia post surgery per CT 10)  CT - Abdominal-hernia - 06/19/2005 11)  hypoglycemia on glyburide  12)  GI camera pill study negative - 01/30/2006 13)   bifascicular block-EKG 14)  5-HIAA normal - 01/30/2006 15)  audiogram - bilat sens/neuro loss - 10/14/2006 16)  CT - Abd&pelvic -   - 06/09/2005 17)  Cardiolite normal EF 54% - 01/30/2006 18)  UGI endoscopy duodenal scar benign bx - 11/12/2006 19)  PFT`s - obstructive disease - 01/30/2006 20)  creatinine clearance 26 - 10/14/2006 21)  PVR 17 cc - 01/08/2007 22)  Stress cardiolite Neg EF 77% - 04/18/2000 TSH nl - 08/05/2003, 23)  ortho- dr. Farris Has at [facility] 24)  cards- Dr. Amil Amen  Pertinent Labs: Patient: Belinda [name] Note: All result statuses are Final unless otherwise noted.  Tests: (1) Comprehensive Metabolic Panel (81191)   Sodium                    139 mEq/L                   135-145   Potassium                 4.4 mEq/L                   3.5-5.3   Chloride                  102 mEq/L                   96-112   CO2                       21 mEq/L                    19-32   Glucose              [H]  127 mg/dL                   47-82   BUN                  [H]  64 mg/dL                    9-56   Creatinine           [H]  2.37 mg/dL                  0.40-1.20     Result repeated and verified.   Bilirubin, Total          0.3 mg/dL                   2.1-3.0   Alkaline Phosphatase [H]  118 U/L                     39-117   AST/SGOT                  26 U/L  0-37   ALT/SGPT                  20 U/L                      0-35   Total Protein             7.1 g/dL                    1.6-1.0   Albumin                   4.3 g/dL                    9.6-0.4   Calcium                   9.0 mg/dL                   5.4-09.8  Tests: (2) Uric Acid (11914)   Uric Acid                 6.1 mg/dL                   7.8-2.9  Document Creation Date: 11/19/2009 11:42 PM _____________________________________________________________   Thank you again for agreeing to see our patient; please contact us if you have any further questions or need additional information.  Sincerely,  Zachery Dauer  MD

## 2011-01-05 NOTE — Progress Notes (Signed)
Summary: Triage  Phone Note Call from Patient Call back at Home Phone [phone]   Reason for Call: Talk to Nurse Summary of Call: pt sts her kidneys arent working Initial call taken by: Knox Royalty,  Apr 12, 2010 9:12 AM  Follow-up for Phone Call        states she  has had trouble voiding x 4 days. feels like she has to void but only a few drops come out. states she is up every night urinating multiple times. cannot void now. has not had anything to drink yet. advised drinking plenty of fluids. she refused to come in now. since pcp is not here she wanted S. Maelynn Moroney only. placed her at 1:30 today. to drink from now until seen Follow-up by: Golden Circle RN,  Apr 12, 2010 9:15 AM

## 2011-01-05 NOTE — Assessment & Plan Note (Signed)
Summary: f/u diarrhea kh   Vital Signs:  Patient profile:   77 year old female Menstrual status:  hysterectomy Height:      59.75 inches Weight:      145.2 pounds BMI:     28.70 Pulse rate:   67 / minute BP sitting:   137 / 77  (right arm)  Vitals Entered By: Arlyss Repress CMA, (September 13, 2010 2:30 PM) CC: f/up DM Is Patient Diabetic? Yes Pain Assessment Patient in pain? no        Primary Care Provider:  Zachery Dauer MD  CC:  f/up DM.  History of Present Illness: This past weekend she had a severe bout of diarrhea and vomiting while out shopping. Felt hot, weak, and sweaty with it. After the diarrhea she found a 6 inch pink worm in her stool. Also saw tv program about worms in the umbilicus and believes she has one of those that comes part way out and she has to push it back in. Her stomach has been hurting since. Burping again, but sublingual nitroglycerine  helps.  Constipated with hard balls for stools about every 3 weeks.   Apt with Dr Caryn Section was postponed due to a death in his family, but she had the blood tests done.   Will see Dr Farris Has soon for Synvisc type injections.   Habits & Providers  Alcohol-Tobacco-Diet     Tobacco Status: never  Allergies: 1)  ! Novocain 2)  ! Doxycycline Hyclate (Doxycycline Hyclate) 3)  * Clonidine 4)  Codeine 5)  Benadryl 6)  * Tavist 7)  Cipro 8)  Neurontin 9)  Septra Ds (Sulfamethoxazole-Trimethoprim)  Physical Exam  General:  Usual appearing. Is swallowing air and burping as she has often done in the past.  Lungs:  normal respiratory effort, normal breath sounds, and no crackles.   Heart:  normal rate and regular rhythm.   Abdomen:  soft.  mildly tender difusely  Soft left flank bulge above surgical scar.  Psych:  mildly anxious.  Denies bad dreams.    Impression & Recommendations:  Problem # 1:  DIARRHEA, RECURRENT (ICD-787.91) Stools for O&P though chance of parasites causing this is low Her updated medication  list for this problem includes:    Loperamide Hcl 2 Mg Caps (Loperamide hcl) .Marland Kitchen... Take 3 then one after each loose bowel movement  Orders: [facility]- Est  Level 4 ([address]214)Future Orders: Miscellaneous Lab Charge-[facility] ([address]) ... 09/15/2010  Problem # 2:  HYPOGLYCEMIA (ICD-251.2) Recent fastings have been in the 156-175 range.   Problem # 3:  LUMBAR SPINAL STENOSIS (ICD-724.02) Continues low back pain  Problem # 4:  HYPERTENSION (ICD-401.9) Assessment: Unchanged  Her updated medication list for this problem includes:    Metoprolol Succinate 50 Mg Tb24 (Metoprolol succinate) .Marland Kitchen... Take 1 tablet by mouth once a day    Nifedipine 60 Mg Xr24h-tab (Nifedipine) .Marland Kitchen... Take one tablet daily  Problem # 5:  DIABETES MELLITUS, II, COMPLICATIONS (ICD-250.92) Assessment: Improved  Her updated medication list for this problem includes:    Ecotrin Low Strength 81 Mg Tbec (Aspirin) .Marland Kitchen... Take 1 tablet by mouth once a day  Orders: A1C-[facility] (60454) [facility]- Est  Level 4 (09811)  Complete Medication List: 1)  Plavix 75 Mg Tabs (Clopidogrel bisulfate) .... Take one tablet daily 2)  Albuterol 90 Mcg/act Aers (Albuterol) .... Inhale 2 puff using inhaler four times a day 3)  Buspirone Hcl 15 Mg Tabs (Buspirone hcl) .... Take 1 tablet by mouth twice a day  4)  Ecotrin Low Strength 81 Mg Tbec (Aspirin) .... Take 1 tablet by mouth once a day 5)  Metoprolol Succinate 50 Mg Tb24 (Metoprolol succinate) .... Take 1 tablet by mouth once a day 6)  Simvastatin 40 Mg Tabs (Simvastatin) .... One daily 7)  Vitamin B-6 100 Mg Tabs (Pyridoxine hcl) .... Take one tablet daily 8)  Allopurinol 100 Mg Tabs (Allopurinol) .Marland Kitchen.. 1 daily 9)  Omeprazole 20 Mg Cpdr (Omeprazole) .... Take one tab two times a day 10)  Tramadol-acetaminophen 37.5-325 Mg Tabs (Tramadol-acetaminophen) .... Take one tablet every 6 hours as needed pain. 11)  Nifedipine 60 Mg Xr24h-tab (Nifedipine) .... Take one tablet daily 12)  Diabetic Shoes  .... Thick  nails, callouses 13)  Triamcinolone Acetonide 0.1 % Oint (Triamcinolone acetonide) .... Use 1-2 times a day for leg rash 14)  Calcitriol 0.5 Mcg Caps (Calcitriol) .... Take one tab 3 times weekly 15)  Proair Hfa 108 (90 Base) Mcg/act Aers (Albuterol sulfate) .... 2 puffs qid as needed 16)  Flonase 50 Mcg/act Susp (Fluticasone propionate) .... Two squirts in each nostril daily 17)  Polyethylene Glycol 3350 Powd (Polyethylene glycol 3350) .Marland KitchenMarland Kitchen. 17 gm in 4 oz of liquid daily, qs for one month 18)  Loperamide Hcl 2 Mg Caps (Loperamide hcl) .... Take 3 then one after each loose bowel movement 19)  Cetirizine Hcl 10 Mg Tabs (Cetirizine hcl) .... Otc one qhs 20)  Isosorbide Mononitrate Cr 30 Mg Xr24h-tab (Isosorbide mononitrate) .... One q morning 21)  Nitrostat 0.4 Mg Subl (Nitroglycerin) .... One under tongue as needed for chest pain, may repeat in 5 minutes twice but if still having problems access emergency care, 1 bottle 22)  Bayer Contour Test Strp (Glucose blood) .... Test fasting and when symptoms of low blood sugar 23)  Micatin 2 % Crea (Miconazole nitrate) .... Apply two times a day  Other Orders: Influenza Vaccine MCR ([id number])  Patient Instructions: 1)  Please schedule a follow-up appointment in 2 months.  2)  I will call if your stool tests show any signs of works or other parasites.    Influenza Vaccine    Vaccine Type: Fluvax MCR    Site: left deltoid    Mfr: GlaxoSmithKline    Dose: 0.5 ml    Route: IM    Given by: Arlyss Repress CMA,    Exp. Date: 05/31/2011    Lot #: [id number]    VIS given: 06/28/10 version given September 13, 2010.  Flu Vaccine Consent Questions    Do you have a history of severe allergic reactions to this vaccine? no    Any prior history of allergic reactions to egg and/or gelatin? no    Do you have a sensitivity to the preservative Thimersol? no    Do you have a past history of Guillan-Barre Syndrome? no    Do you currently have an acute febrile illness?  no    Have you ever had a severe reaction to latex? no    Vaccine information given and explained to patient? yes    Are you currently pregnant? no  Laboratory Results   Blood Tests   Date/Time Received: September 13, 2010 3:14 PM  Date/Time Reported: September 13, 2010 3:14 PM   HGBA1C: 6.7%   (Normal Range: Non-Diabetic - 3-6%   Control Diabetic - 6-8%)  Comments: ...............test performed by......Marland KitchenBonnie A. [place], MLS (ASCP)cm      Prevention & Chronic Care Immunizations   Influenza vaccine: Fluvax MCR  (09/13/2010)   Influenza  vaccine due: 08/04/2010    Tetanus booster: 01/04/2005: Done.   Tetanus booster due: 01/04/2015    Pneumococcal vaccine: Done.  (08/05/1999)   Pneumococcal vaccine deferral: Not indicated  (10/19/2009)   Pneumococcal vaccine due: None    H. zoster vaccine: 10/04/2009: Zostavax  Colorectal Screening   Hemoccult: Done.  (02/01/2002)   Hemoccult due: Not Indicated    Colonoscopy: Done.  (02/01/2002)   Colonoscopy due: 02/02/2012  Other Screening   Pap smear: Done.  (01/04/2006)   Pap smear due: Not Indicated    Mammogram: Assessment: BIRADS 4. Location: [facility].    (05/12/2010)   Mammogram action/deferral: Recheck in 6 mos. to consider biopsy for low risk microcalcifications  (05/12/2010)   Mammogram due: 09/24/2009    DXA bone density scan: Done.  (10/04/2000)   DXA bone density action/deferral: Ordered  (07/29/2009)   DXA scan due: None    Smoking status: never  (09/13/2010)  Diabetes Mellitus   HgbA1C: 6.7  (09/13/2010)   Hemoglobin A1C due: Not Indicated    Eye exam: status post laser  (06/14/2010)   Eye exam due: 07/09/2009    Foot exam: yes  (07/29/2009)   High risk foot: Not documented   Foot care education: Not documented   Foot exam due: 07/14/2009    Urine microalbumin/creatinine ratio: Not documented   Urine microalbumin/cr due: Not Indicated    Diabetes flowsheet reviewed?:  Yes   Progress toward A1C goal: At goal  Lipids   Total Cholesterol: 142  (03/10/2010)   LDL: 72  (03/10/2010)   LDL Direct: 88  (07/28/2008)   HDL: 35  (03/10/2010)   Triglycerides: 177  (03/10/2010)    SGOT (AST): 28  (07/28/2010)   SGPT (ALT): 17  (07/28/2010)   Alkaline phosphatase: 84  (07/28/2010)   Total bilirubin: 0.4  (07/28/2010)    Lipid flowsheet reviewed?: Yes   Progress toward LDL goal: At goal  Hypertension   Last Blood Pressure: 137 / 77  (09/13/2010)   Serum creatinine: 2.52  (07/28/2010)   Serum potassium 5.0  (07/28/2010)    Hypertension flowsheet reviewed?: Yes   Progress toward BP goal: Improved  Self-Management Support :   Personal Goals (by the next clinic visit) :     Personal A1C goal: 7  (07/29/2009)     Personal blood pressure goal: 130/80  (07/29/2009)     Personal LDL goal: 100  (07/29/2009)    Diabetes self-management support: Education handout  (07/29/2009)    Hypertension self-management support: Education handout  (07/29/2009)    Lipid self-management support: Not documented

## 2011-01-05 NOTE — Assessment & Plan Note (Signed)
Summary: n&v, diarrhea/[place]/hale   Vital Signs:  Patient profile:   77 year old female Menstrual status:  hysterectomy Height:      59.75 inches Weight:      143 pounds BMI:     28.26 Temp:     98.1 degrees F oral Pulse rate:   61 / minute BP sitting:   121 / 61  (left arm)  Vitals Entered By: Tessie Fass CMA (July 28, 2010 10:39 AM) CC: weak and dizzy and headache Is Patient Diabetic? Yes Pain Assessment Patient in pain? yes     Location: head   Primary Care Alonia Dibuono:  Zachery Dauer MD  CC:  weak and dizzy and headache.  History of Present Illness:  She comes in to followup o her diarrhea that ceased after 2 immodiom.   She is now having more burping. Took an ntg yesterday with some relief. Has dyspnea on exertion but no chest pain.  Didn't eat before coming to the clinic.   Glucose 9:45 AM today was 53. 11:38 yesterday 140. Highest recently on the machine was 200.     Allergies: 1)  ! Novocain 2)  ! Doxycycline Hyclate (Doxycycline Hyclate) 3)  * Clonidine 4)  Codeine 5)  Benadryl 6)  * Tavist 7)  Cipro 8)  Neurontin 9)  Septra Ds (Sulfamethoxazole-Trimethoprim)  Physical Exam  General:  Usual appearing. Swallowing air and burping as she has often done in the past.  Lungs:  normal respiratory effort, normal breath sounds, and no crackles.   Heart:  normal rate and regular rhythm.  Difficult to hear due to stomach noises Abdomen:  soft.  mildly tender. Loud borborygmi  Soft left flank bulge above surgical scar.  Extremities:  tr edema at ankles Psych:  moderately anxious.     Impression & Recommendations:  Problem # 1:  DIARRHEA, RECURRENT (ICD-787.91) IBS Her updated medication list for this problem includes:    Loperamide Hcl 2 Mg Caps (Loperamide hcl) .Marland Kitchen... Take 3 then one after each loose bowel movement  Orders: Comp Met-[facility] ([id number])  Problem # 2:  HYPOGLYCEMIA (ICD-251.2) Despite not being on hypoglycemics, though often may be  autonomic symptoms from her IBS.  Orders: Comp Met-[facility] ([id number])  Problem # 3:  AEROPHAGIA (ICD-306.4) EKG done to rule out angina equivalent, but no changes.   Complete Medication List: 1)  Plavix 75 Mg Tabs (Clopidogrel bisulfate) .... Take one tablet daily 2)  Albuterol 90 Mcg/act Aers (Albuterol) .... Inhale 2 puff using inhaler four times a day 3)  Buspirone Hcl 15 Mg Tabs (Buspirone hcl) .... Take 1 tablet by mouth twice a day 4)  Ecotrin Low Strength 81 Mg Tbec (Aspirin) .... Take 1 tablet by mouth once a day 5)  Florastor (saccharomyces Boulardii) Caps  .... Take 1 packet by mouth twice a day 6)  Metoprolol Succinate 50 Mg Tb24 (Metoprolol succinate) .... Take 1 tablet by mouth once a day 7)  Simvastatin 40 Mg Tabs (Simvastatin) .... One daily 8)  Vitamin B-6 100 Mg Tabs (Pyridoxine hcl) .... Take one tablet daily 9)  Allopurinol 100 Mg Tabs (Allopurinol) .Marland Kitchen.. 1 daily 10)  Omeprazole 20 Mg Cpdr (Omeprazole) .... Take one tab two times a day 11)  Tramadol-acetaminophen 37.5-325 Mg Tabs (Tramadol-acetaminophen) .... Take one tablet every 6 hours as needed pain. 12)  Nifedipine 60 Mg Xr24h-tab (Nifedipine) .... Take one tablet daily 13)  Diabetic Shoes  .... Thick nails, callouses 14)  Triamcinolone Acetonide 0.1 % Oint (Triamcinolone acetonide) .... Use  1-2 times a day for leg rash 15)  Calcitriol 0.5 Mcg Caps (Calcitriol) .... Take one tab 3 times weekly 16)  Colcrys 0.6 Mg Tabs (Colchicine) .... Take one tablet daily as needed for gout 17)  Proair Hfa 108 (90 Base) Mcg/act Aers (Albuterol sulfate) .... 2 puffs qid as needed 18)  Flonase 50 Mcg/act Susp (Fluticasone propionate) .... Two squirts in each nostril daily 19)  Polyethylene Glycol 3350 Powd (Polyethylene glycol 3350) .Marland KitchenMarland Kitchen. 17 gm in 4 oz of liquid daily, qs for one month 20)  Loperamide Hcl 2 Mg Caps (Loperamide hcl) .... Take 3 then one after each loose bowel movement 21)  Cetirizine Hcl 10 Mg Tabs (Cetirizine hcl)  .... Otc one qhs 22)  Isosorbide Mononitrate Cr 30 Mg Xr24h-tab (Isosorbide mononitrate) .... One q morning 23)  Nitrostat 0.4 Mg Subl (Nitroglycerin) .... One under tongue as needed for chest pain, may repeat in 5 minutes twice but if still having problems access emergency care, 1 bottle 24)  Bayer Contour Test Strp (Glucose blood) .... Test fasting and when symptoms of low blood sugar 25)  Micatin 2 % Crea (Miconazole nitrate) .... Apply two times a day  Other Orders: EKG- [facility] (EKG) [facility]- Est  Level 4 (16109)  Patient Instructions: 1)  I will call with lab results if they are abnormal  2)  If you have more need to take sublingual nitroglycerine  for chest discomfort and we will schedule appointment with Dr Ty Hilts.  3)  Please come in for your appt as already scheduled, sooner if the severe diarrhea recurs.

## 2011-01-05 NOTE — Letter (Signed)
Summary: Generic Letter  [facility]  [address]   [place], [place] [address]   Phone: [phone]  Fax: [phone]    02/22/2010 Regarding Belinda [name] [address] [place], [place]  [address]  Dear Dr Caryn Section.  [name] received 7 days of Doxycycline 100 mg two times a day for infected nares and possible sinusitis on 12/24/09. She had a sore nodule on her buttocks one that visit and broke out in "shingles" a few days later, but couldn't come in for confirmation due to the weather.   She was treated on Jan 31st by our nurse practitioner with Amoxicillin 500 mg three times a day  for 7 days for sinusitis, but only took it twice daily until Feb 4th when she called complaining of no improvement.   I treated her with Avelox 400 mg once daily for 5 days on Feb 4th for persistent sinus symptoms and she was improved in follow-up  on Feb 17th.    Sincerely,   Zachery Dauer MD  Appended Document: Generic Letter faxed

## 2011-01-05 NOTE — Consult Note (Signed)
Summary: [facility] -intravitreous injection Avastin  [facility] -procedure note   Imported By: De Nurse 08/05/2010 12:23:14  _____________________________________________________________________  External Attachment:    Type:   Image     Comment:   External Document  Appended Document: [facility] -intravitreous injection Avastin    Clinical Lists Changes  Problems: Added new problem of MACULAR DEGENERATION, EXUDATIVE (ICD-362.52)

## 2011-01-05 NOTE — Progress Notes (Signed)
Summary: Req lab results  Phone Note Call from Patient Call back at Home Phone [phone]   Reason for Call: Lab or Test Results Summary of Call: pt req lab results  Initial call taken by: Knox Royalty,  August 09, 2010 2:48 PM  Follow-up for Phone Call        I gave her the stable results of her labs. She'll follow-up with Dr Madilyn Fireman of the gastric emptying test she had today.  Follow-up by: Zachery Dauer MD,  August 10, 2010 4:10 PM

## 2011-01-05 NOTE — Letter (Signed)
Summary: *Referral Letter  [facility]  [address]   [place], [place] [address]   Phone: [phone]  Fax: [phone]    01/20/2010  Thank you in advance for agreeing to see my patient:  Belinda [name] [address] [place], [place]  [address]  Phone: [phone]  Reason for Referral: Follow-up of kidney function. Feeling fatigued after sinusitis and respiratory infection.  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) 12)      RETINOPATHY, DIABETIC, PROLIFERATIVE (ICD-362.02) 13)          VISUAL ACUITY, DECREASED, RIGHT EYE (ICD-369.9) 14)      NEUROPATHY, DIABETIC (ICD-250.60) 15)  CHRONIC KIDNEY DISEASE STAGE III (MODERATE) (ICD-585.3) 16)      ANEMIA IN CHRONIC KIDNEY DISEASE (ICD-285.21) 17)      NEPHRECTOMY, HX OF (ICD-V45.73) 18)      NEPHROLITHIASIS, HX OF (ICD-V13.01) 19)  ACUTE GOUTY ARTHROPATHY (ICD-274.01) 20)      PAIN IN JOINT, HAND (ICD-719.44) 21)      PSEUDOGOUT (ICD-275.49) 22)  ATHEROSCLEROTIC CARDIOVASCULAR DISEASE (ICD-429.2) 23)  URINARY INCONTINENCE, URGE (ICD-788.31) 24)  NEOPLASM OF UNCERTAIN BEHAVIOR OF SKIN (ICD-238.2) 25)  BASAL CELL CARCINOMA, HX OF (ICD-V10.83) 26)  LUMBAR SPINAL STENOSIS (ICD-724.02) 27)      UNSTEADY GAIT (ICD-781.2) 28)  HERNIA, SITE NOS W/O OBSTRUCTION/GANGRENE (ICD-553.9) 29)  GASTROESOPHAGEAL REFLUX, NO ESOPHAGITIS (ICD-530.81) 30)  ASTHMA (ICD-493.90) 31)  VARICOSE VEINS (ICD-454.9) 32)  RHINITIS, ALLERGIC (ICD-477.9) 33)  HYPERLIPIDEMIA (ICD-272.4) 34)  HEARING LOSS NOS OR DEAFNESS (ICD-389.9) 35)  DIVERTICULOSIS OF COLON (ICD-562.10) 36)  CATARACT (ICD-366.9)   Current  Medications: 1)  PLAVIX 75 MG TABS (CLOPIDOGREL BISULFATE) Take one tablet daily 2)  ALBUTEROL 90 MCG/ACT AERS (ALBUTEROL) Inhale 2 puff using inhaler four times a day 3)  BUSPIRONE HCL 15 MG TABS (BUSPIRONE HCL) Take 1 tablet by mouth twice a day 4)  ECOTRIN LOW STRENGTH 81 MG TBEC (ASPIRIN) Take 1 tablet by mouth once a day 5)  * FLORASTOR  (SACCHAROMYCES BOULARDII) CAPS Take 1 packet by mouth twice a day 6)  METOPROLOL SUCCINATE 50 MG TB24 (METOPROLOL SUCCINATE) Take 1 tablet by mouth once a day 7)  SIMVASTATIN 40 MG  TABS (SIMVASTATIN) one daily 8)  LASIX 20 MG TABS (FUROSEMIDE) Take 2  tablets by mouth each A.M. 9)  VITAMIN B-6 100 MG TABS (PYRIDOXINE HCL) Take one tablet daily 10)  ALLOPURINOL 100 MG  TABS (ALLOPURINOL) 1.5 daily 11)  OMEPRAZOLE 20 MG CPDR (OMEPRAZOLE) Take one tab two times a day 12)  TRAMADOL-ACETAMINOPHEN 37.5-325 MG TABS (TRAMADOL-ACETAMINOPHEN) Take one tablet every 6 hours as needed pain. 13)  GLYBURIDE 5 MG TABS (GLYBURIDE) Take 1/2 tab two times a day 14)  NIFEDIPINE 60 MG XR24H-TAB (NIFEDIPINE) Take one tablet daily 15)  ZOSTAVAX 28413 UNT/0.65ML SOLR (ZOSTER VACCINE LIVE) dispense and inject x 1 16)  * DIABETIC SHOES thick nails, callouses [BMN] 17)  JANUVIA 100 MG TABS (SITAGLIPTIN PHOSPHATE) Take 1/4 tab daily 18)  TRIAMCINOLONE ACETONIDE 0.1 % OINT (TRIAMCINOLONE ACETONIDE) Use 1-2 times a day for leg rash 19)  MUPIROCIN 2 % OINT (MUPIROCIN) apply to sores in nose, 22 gm 20)  CALCITRIOL 0.5 MCG CAPS (CALCITRIOL) Take one tab 3  times weekly 21)  COLCRYS 0.6 MG TABS (COLCHICINE) Take one tablet daily as directed 22)  PROAIR HFA 108 (90 BASE) MCG/ACT AERS (ALBUTEROL SULFATE) 2 puffs qid as needed 23)  FLONASE 50 MCG/ACT SUSP (FLUTICASONE PROPIONATE) two squirts in each nostril daily 24)  POLYETHYLENE GLYCOL 3350  POWD (POLYETHYLENE GLYCOL 3350) 17 GM in 4 oz of liquid daily, QS for one month   Past Medical History: 1)  EGD antral polyp benign -  03/04/2002 2)  diabetic reinopathy 3)  CT -Lumbar Spine-, CT pelvis-negative - 01/01/2001 4)  spinal stenosis L4,5 L3,4 - 01/01/2001 5)  colonoscopy int hem - 03/04/2002  6)  MRI of back spinal stenosis - 05/21/2003  7)  CT - Abdominal & pelvic - 02/02/2004 8)  CT sinuses neg Dr Christella Hartigan - 02/09/2004 9)  L paraspinal hernia post surgery per CT 10)  CT - Abdominal-hernia - 06/19/2005 11)  hypoglycemia on glyburide  12)  GI camera pill study negative - 01/30/2006 13)  bifascicular block-EKG 14)  5-HIAA normal - 01/30/2006 15)  audiogram - bilat sens/neuro loss - 10/14/2006 16)  CT - Abd&pelvic -   - 06/09/2005 17)  Cardiolite normal EF 54% - 01/30/2006 18)  UGI endoscopy duodenal scar benign bx - 11/12/2006 19)  PFT`s - obstructive disease - 01/30/2006 20)  creatinine clearance 26 - 10/14/2006 21)  PVR 17 cc - 01/08/2007 22)  Stress cardiolite Neg EF 77% - 04/18/2000 TSH nl - 08/05/2003, 23)  ortho- dr. Farris Has at [facility] 24)  cards- Dr. Amil Amen  Pertinent Labs:  Patient: Belinda [name]   Tests: (1) Basic Metabolic Panel ([address])   Sodium                    142 mEq/L                   135-145   Potassium                 3.9 mEq/L                   3.5-5.3   Chloride                  110 mEq/L                   96-112   CO2                       19 mEq/L                    19-32   Glucose              [H]  160 mg/dL                   98-11   BUN                  [H]  72 mg/dL                    9-14   Creatinine           [H]  2.38 mg/dL                  0.40-1.20     Result repeated and verified.   Calcium                   8.8 mg/dL  8.4-10.5  Document Creation Date: 01/04/2010 1:11 AM  Hemoglobin was 11.5 in August. A1c was 8.5 in December.  Thank you again for agreeing to see our patient; please contact us if you have any further questions or need additional information.  Sincerely,  Zachery Dauer MD

## 2011-01-05 NOTE — Progress Notes (Signed)
Summary: phn msg  Phone Note From Other Clinic Call back at [phone]    Caller: [facility] Summary of Call: needs talk to nurse about her home health referral  Initial call taken by: De Nurse,  December 21, 2010 10:02 AM  Follow-up for Phone Call        PT does not do safety eval, RNs do. told her ok to have a RN do the eval Follow-up by: Golden Circle RN,  December 21, 2010 10:13 AM

## 2011-01-05 NOTE — Progress Notes (Signed)
Summary: followup of diarrhea and labs  Phone Note Outgoing Call   Call placed by: Zachery Dauer MD,  February 08, 2010 10:37 AM Summary of Call: Diarrhea has resolved. Hadn't eaten until labs at 11 AM which is the reason for the low sugar. They've actually been as high as 156 fasting. Creatinine has improved off furosemide and she denies any edema or increased short of breath. She'll call if that develops and will keep appointment Mar 17th.  Initial call taken by: Zachery Dauer MD,  February 08, 2010 10:43 AM

## 2011-01-05 NOTE — Letter (Signed)
Summary: Results Follow-up Letter  [facility]  [address]   [place], [place] [address]   Phone: [phone]  Fax: [phone]    12/27/2010  [address] [place], [place]  [address]  Dear Ms. Mcgath,   The following are the results of your recent test(s): Patient: Belinda [name] Good news! Your kidney function is back to where is was before.  Tests: (1) BMP with Estimated GFR (2404)   Sodium                    141 mEq/L                   135-145   Potassium                 4.4 mEq/L                   3.5-5.3   Chloride                  109 mEq/L                   96-112   CO2                       20 mEq/L                    19-32   Glucose              [H]  197 mg/dL                   62-95   BUN                  [H]  39 mg/dL                    2-84   Creatinine           [H]  2.25 mg/dL                  0.40-1.20     Result repeated and verified.   Calcium                   9.5 mg/dL                   1.3-24.4 ! Est GFR, African American                        [L]  25 mL/min                   >60 ! Est GFR, NonAfrican American                        [L]  21 mL/min                   >60  Document Creation Date: 12/27/2010 6:31 AM  Sincerely,  Zachery Dauer MD [facility]           Appended Document: Results Follow-up Letter mailed

## 2011-01-05 NOTE — Assessment & Plan Note (Signed)
Summary: F/U FLUID BUILDUP/[facility]   Vital Signs:  Patient profile:   77 year old female Menstrual status:  hysterectomy Height:      59.75 inches Weight:      141 pounds BMI:     27.87 BP sitting:   152 / 70  (right arm) Cuff size:   regular  Vitals Entered By: [place] Morelle, SMA CC: Pt complains of alot of gas. (passing gas and burping) and dyspnea Is Patient Diabetic? Yes Pain Assessment Patient in pain? yes     Location: chest Onset of pain  X2 days   Primary Care Provider:  Zachery Dauer MD  CC:  Pt complains of alot of gas. (passing gas and burping) and dyspnea.  History of Present Illness: One week folllow up:  Short course of prednisone for multiple inflammed joints, improvement in joint pain but caused GI side effects of stomach irritaion, incresed burping, and diarrhea.  Diarrhea is resolved, still feeling pressure in her chest that is relieved by burping.  Took a NTG today and had some relief, she feels the NTG she has is very old.    LE edema imporved with one dose of Lasix 40 mg and now on 20 mg every other day.  Vensous stasis rash resolving.  Has sinus pressure and discomfort, was given the wrong OTC product by the pharmacy tech (given cetrizine D)  Current Medications (verified): 1)  Plavix 75 Mg Tabs (Clopidogrel Bisulfate) .... Take One Tablet Daily 2)  Albuterol 90 Mcg/act Aers (Albuterol) .... Inhale 2 Puff Using Inhaler Four Times A Day 3)  Buspirone Hcl 15 Mg Tabs (Buspirone Hcl) .... Take 1 Tablet By Mouth Twice A Day 4)  Ecotrin Low Strength 81 Mg Tbec (Aspirin) .... Take 1 Tablet By Mouth Once A Day 5)  Florastor  (Saccharomyces Boulardii) Caps .... Take 1 Packet By Mouth Twice A Day 6)  Metoprolol Succinate 50 Mg Tb24 (Metoprolol Succinate) .... Take 1 Tablet By Mouth Once A Day 7)  Simvastatin 40 Mg  Tabs (Simvastatin) .... One Daily 8)  Lasix 20 Mg Tabs (Furosemide) .... Take 2  Tablets By Mouth Each A.m. 9)  Vitamin B-6 100 Mg Tabs (Pyridoxine Hcl)  .... Take One Tablet Daily 10)  Allopurinol 100 Mg  Tabs (Allopurinol) .... 1.5 Daily 11)  Omeprazole 20 Mg Cpdr (Omeprazole) .... Take One Tab Two Times A Day 12)  Tramadol-Acetaminophen 37.5-325 Mg Tabs (Tramadol-Acetaminophen) .... Take One Tablet Every 6 Hours As Needed Pain. 13)  Glyburide 5 Mg Tabs (Glyburide) .... Take 1/4  Tab One Times A Day 14)  Nifedipine 60 Mg Xr24h-Tab (Nifedipine) .... Take One Tablet Daily 15)  Diabetic Shoes .... Thick Nails, Callouses 16)  Januvia 100 Mg Tabs (Sitagliptin Phosphate) .... Take 1/4 Tab Daily 17)  Triamcinolone Acetonide 0.1 % Oint (Triamcinolone Acetonide) .... Use 1-2 Times A Day For Leg Rash 18)  Mupirocin 2 % Oint (Mupirocin) .... Apply To Sores in Nose, 22 Gm 19)  Calcitriol 0.5 Mcg Caps (Calcitriol) .... Take One Tab 3 Times Weekly 20)  Colcrys 0.6 Mg Tabs (Colchicine) .... Take One Tablet Daily As Directed 21)  Proair Hfa 108 (90 Base) Mcg/act Aers (Albuterol Sulfate) .... 2 Puffs Qid As Needed 22)  Flonase 50 Mcg/act Susp (Fluticasone Propionate) .... Two Squirts in Each Nostril Daily 23)  Polyethylene Glycol 3350  Powd (Polyethylene Glycol 3350) .Marland KitchenMarland KitchenMarland Kitchen 17 Gm in 4 Oz of Liquid Daily, Qs For One Month 24)  Loperamide Hcl 2 Mg Caps (Loperamide Hcl) .Marland KitchenMarland KitchenMarland Kitchen  Take 3 Then One After Each Loose Bowel Movement 25)  Cetirizine Hcl 10 Mg Tabs (Cetirizine Hcl) .... Otc One Qhs 26)  Isosorbide Mononitrate Cr 30 Mg Xr24h-Tab (Isosorbide Mononitrate) .... One Q Morning 27)  Nitrostat 0.4 Mg Subl (Nitroglycerin) .... One Under Tongue As Needed For Chest Pain, May Repeat in 5 Minutes Twice But If Still Having Problems Access Emergency Care, 1 Bottle  Allergies (verified): 1)  ! Novocain 2)  ! Doxycycline Hyclate (Doxycycline Hyclate) 3)  * Clonidine 4)  Codeine 5)  Benadryl 6)  * Tavist 7)  Cipro 8)  Neurontin 9)  Septra Ds (Sulfamethoxazole-Trimethoprim)  Review of Systems      See HPI ENT:  Complains of postnasal drainage and sinus pressure. CV:   Complains of chest pain or discomfort; denies weight gain. GI:  Complains of diarrhea and gas; denies nausea and vomiting.  Physical Exam  General:  Looked well, constant aerophagia and subsequent burping during encounter Mouth:  lymphatic hyperplasia, milky post nasal drainage Lungs:  normal respiratory effort and normal breath sounds.   Heart:  normal rate and regular rhythm.   Extremities:  no edema,  Skin:  rash on LE resolved, small superficial ulcer with break in integrity on inner aspect of area of gluteal fold   Impression & Recommendations:  Problem # 1:  RHINITIS, ALLERGIC (ICD-477.9) clarified to return D containing antihistamine Her updated medication list for this problem includes:    Flonase 50 Mcg/act Susp (Fluticasone propionate) .Marland Kitchen..Marland Kitchen Two squirts in each nostril daily    Cetirizine Hcl 10 Mg Tabs (Cetirizine hcl) ..... Otc one qhs  Problem # 2:  VENOUS INSUFFICIENCY (ICD-459.81)  Improved, reinforced that we do not want to dry her out and  little edema is good.  Make furosemide 20 mg every other day.  Orders: [facility]- Est  Level 4 ([address])  Problem # 3:  AEROPHAGIA (ICD-306.4)  Self initiated to relieve fullness and heaviness in gut, has relief wtih NTG, trial of long acting nitrate, may also bring BP down.  Caution with use of as needed NTG while taking and caution about worsening of HA.  Orders: [facility]- Est  Level 4 ([address])  Problem # 4:  HYPERLIPIDEMIA (ICD-272.4) fasting lipids and BMEt checked today Her updated medication list for this problem includes:    Simvastatin 40 Mg Tabs (Simvastatin) ..... One daily  Orders: [facility]- Est  Level 4 ([address])  Complete Medication List: 1)  Plavix 75 Mg Tabs (Clopidogrel bisulfate) .... Take one tablet daily 2)  Albuterol 90 Mcg/act Aers (Albuterol) .... Inhale 2 puff using inhaler four times a day 3)  Buspirone Hcl 15 Mg Tabs (Buspirone hcl) .... Take 1 tablet by mouth twice a day 4)  Ecotrin Low Strength 81 Mg Tbec  (Aspirin) .... Take 1 tablet by mouth once a day 5)  Florastor (saccharomyces Boulardii) Caps  .... Take 1 packet by mouth twice a day 6)  Metoprolol Succinate 50 Mg Tb24 (Metoprolol succinate) .... Take 1 tablet by mouth once a day 7)  Simvastatin 40 Mg Tabs (Simvastatin) .... One daily 8)  Lasix 20 Mg Tabs (Furosemide) .... Take 2  tablets by mouth each a.m. 9)  Vitamin B-6 100 Mg Tabs (Pyridoxine hcl) .... Take one tablet daily 10)  Allopurinol 100 Mg Tabs (Allopurinol) .... 1.5 daily 11)  Omeprazole 20 Mg Cpdr (Omeprazole) .... Take one tab two times a day 12)  Tramadol-acetaminophen 37.5-325 Mg Tabs (Tramadol-acetaminophen) .... Take one tablet every 6 hours as needed pain.  13)  Glyburide 5 Mg Tabs (Glyburide) .... Take 1/4  tab one times a day 14)  Nifedipine 60 Mg Xr24h-tab (Nifedipine) .... Take one tablet daily 15)  Diabetic Shoes  .... Thick nails, callouses 16)  Januvia 100 Mg Tabs (Sitagliptin phosphate) .... Take 1/4 tab daily 17)  Triamcinolone Acetonide 0.1 % Oint (Triamcinolone acetonide) .... Use 1-2 times a day for leg rash 18)  Mupirocin 2 % Oint (Mupirocin) .... Apply to sores in nose, 22 gm 19)  Calcitriol 0.5 Mcg Caps (Calcitriol) .... Take one tab 3 times weekly 20)  Colcrys 0.6 Mg Tabs (Colchicine) .... Take one tablet daily as directed 21)  Proair Hfa 108 (90 Base) Mcg/act Aers (Albuterol sulfate) .... 2 puffs qid as needed 22)  Flonase 50 Mcg/act Susp (Fluticasone propionate) .... Two squirts in each nostril daily 23)  Polyethylene Glycol 3350 Powd (Polyethylene glycol 3350) .Marland KitchenMarland Kitchen. 17 gm in 4 oz of liquid daily, qs for one month 24)  Loperamide Hcl 2 Mg Caps (Loperamide hcl) .... Take 3 then one after each loose bowel movement 25)  Cetirizine Hcl 10 Mg Tabs (Cetirizine hcl) .... Otc one qhs 26)  Isosorbide Mononitrate Cr 30 Mg Xr24h-tab (Isosorbide mononitrate) .... One q morning 27)  Nitrostat 0.4 Mg Subl (Nitroglycerin) .... One under tongue as needed for chest pain,  may repeat in 5 minutes twice but if still having problems access emergency care, 1 bottle  Patient Instructions: 1)  The long acting nitroglycerine will relax your esophagus, be careful with the under the tongue version 2)  Ceterizine without the D for your allergies 3)  Purchase band-aids with antibiotic ointment alreay in the gauze, and use on the sore, change daily. 4)  Please schedule a follow-up appointment in 1 month.  Prescriptions: CETIRIZINE HCL 10 MG TABS (CETIRIZINE HCL) OTC one qhs  #30 x 11   Entered by:   Luretha Murphy NP   Authorized by:   Zachery Dauer MD   Signed by:   Luretha Murphy NP on 03/10/2010   Method used:   Print then Give to Patient   RxID:   [id number] NIFEDIPINE 60 MG XR24H-TAB (NIFEDIPINE) Take one tablet daily  #30 x 6   Entered by:   Luretha Murphy NP   Authorized by:   Zachery Dauer MD   Signed by:   Luretha Murphy NP on 03/10/2010   Method used:   Electronically to        [facility] (retail)       [address]       [place], [place]  [id number]       Ph: [phone]       Fax: [phone]   RxID:   [id number] NITROSTAT 0.4 MG SUBL (NITROGLYCERIN) one under tongue as needed for chest pain, may repeat in 5 minutes twice but if still having problems access emergency care, 1 bottle  #1 x 6   Entered by:   Luretha Murphy NP   Authorized by:   Zachery Dauer MD   Signed by:   Luretha Murphy NP on 03/10/2010   Method used:   Electronically to        [facility] (retail)       [address]       [place], [place]  [id number]       Ph: [phone]       Fax: [phone]   RxID:   [id number] ISOSORBIDE MONONITRATE CR 30 MG XR24H-TAB (  ISOSORBIDE MONONITRATE) one q morning  #30 x 3   Entered by:   Luretha Murphy NP   Authorized by:   Zachery Dauer MD   Signed by:   Luretha Murphy NP on 03/10/2010   Method used:   Electronically to        [facility] (retail)       [address]        [place], [place]  [id number]       Ph: [phone]       Fax: [phone]   RxID:   [id number]     Prevention & Chronic Care Immunizations   Influenza vaccine: Fluvax MCR  (09/09/2009)   Influenza vaccine due: 08/04/2010    Tetanus booster: 01/04/2005: Done.   Tetanus booster due: 01/04/2015    Pneumococcal vaccine: Done.  (08/05/1999)   Pneumococcal vaccine deferral: Not indicated  (10/19/2009)   Pneumococcal vaccine due: None    H. zoster vaccine: 10/04/2009: Zostavax  Colorectal Screening   Hemoccult: Done.  (02/01/2002)   Hemoccult due: Not Indicated    Colonoscopy: Done.  (02/01/2002)   Colonoscopy due: 02/02/2012  Other Screening   Pap smear: Done.  (01/04/2006)   Pap smear due: Not Indicated    Mammogram: normal  (09/24/2008)   Mammogram due: 09/24/2009    DXA bone density scan: Done.  (10/04/2000)   DXA bone density action/deferral: Ordered  (07/29/2009)   DXA scan due: None    Smoking status: never  (02/17/2010)  Diabetes Mellitus   HgbA1C: 5.7  (02/17/2010)   Hemoglobin A1C due: Not Indicated    Eye exam: diabetic retinopathy  (07/09/2008)   Eye exam due: 07/09/2009    Foot exam: yes  (07/29/2009)   High risk foot: Not documented   Foot care education: Not documented   Foot exam due: 07/14/2009    Urine microalbumin/creatinine ratio: Not documented   Urine microalbumin/cr due: Not Indicated    Diabetes flowsheet reviewed?: Yes   Progress toward A1C goal: At goal  Lipids   Total Cholesterol: 90  (02/07/2010)   LDL: 34  (02/07/2010)   LDL Direct: 88  (07/28/2008)   HDL: 30  (02/07/2010)   Triglycerides: 128  (02/07/2010)    SGOT (AST): 40  (02/07/2010)   SGPT (ALT): 22  (02/07/2010)   Alkaline phosphatase: 82  (02/07/2010)   Total bilirubin: 0.4  (02/07/2010)    Lipid flowsheet reviewed?: Yes   Progress toward LDL goal: At goal  Hypertension   Last Blood Pressure: 152 / 70  (03/10/2010)   Serum creatinine: 2.52  (02/07/2010)    Serum potassium 4.6  (02/07/2010)    Hypertension flowsheet reviewed?: Yes   Progress toward BP goal: Deteriorated  Self-Management Support :   Personal Goals (by the next clinic visit) :     Personal A1C goal: 7  (07/29/2009)     Personal blood pressure goal: 130/80  (07/29/2009)     Personal LDL goal: 100  (07/29/2009)    Diabetes self-management support: Education handout  (07/29/2009)    Hypertension self-management support: Education handout  (07/29/2009)    Lipid self-management support: Not documented

## 2011-01-05 NOTE — Assessment & Plan Note (Signed)
Summary: [facility]ARRHEA[facility]   Vital Signs:  Patient profile:   77 year old female Menstrual status:  hysterectomy Height:      59.75 inches Weight:      145 pounds Temp:     97.5 degrees F oral BP sitting:   130 / 52  Vitals Entered By: Tessie Fass CMA (February 07, 2010 11:46 AM) CC: diarrhea since this am Is Patient Diabetic? Yes Pain Assessment Patient in pain? no        Primary Care Provider:  Zachery Dauer MD  CC:  diarrhea since this am.  History of Present Illness: Diarrhea stools last week and today.  Not quite liquid.  Has not taken laxative in 2 weeks. She can only like dairy products.    Has to strain to void.  Now off furosemide with last bump in creatitine.  Very upset about neighbors and dog and going to court because of dog.  Current Medications (verified): 1)  Plavix 75 Mg Tabs (Clopidogrel Bisulfate) .... Take One Tablet Daily 2)  Albuterol 90 Mcg/act Aers (Albuterol) .... Inhale 2 Puff Using Inhaler Four Times A Day 3)  Buspirone Hcl 15 Mg Tabs (Buspirone Hcl) .... Take 1 Tablet By Mouth Twice A Day 4)  Ecotrin Low Strength 81 Mg Tbec (Aspirin) .... Take 1 Tablet By Mouth Once A Day 5)  Florastor  (Saccharomyces Boulardii) Caps .... Take 1 Packet By Mouth Twice A Day 6)  Metoprolol Succinate 50 Mg Tb24 (Metoprolol Succinate) .... Take 1 Tablet By Mouth Once A Day 7)  Simvastatin 40 Mg  Tabs (Simvastatin) .... One Daily 8)  Lasix 20 Mg Tabs (Furosemide) .... Take 2  Tablets By Mouth Each A.m. 9)  Vitamin B-6 100 Mg Tabs (Pyridoxine Hcl) .... Take One Tablet Daily 10)  Allopurinol 100 Mg  Tabs (Allopurinol) .... 1.5 Daily 11)  Omeprazole 20 Mg Cpdr (Omeprazole) .... Take One Tab Two Times A Day 12)  Tramadol-Acetaminophen 37.5-325 Mg Tabs (Tramadol-Acetaminophen) .... Take One Tablet Every 6 Hours As Needed Pain. 13)  Glyburide 5 Mg Tabs (Glyburide) .... Take 1/2 Tab Two Times A Day 14)  Nifedipine 60 Mg Xr24h-Tab (Nifedipine) .... Take One Tablet Daily 15)   Diabetic Shoes .... Thick Nails, Callouses 16)  Januvia 100 Mg Tabs (Sitagliptin Phosphate) .... Take 1/4 Tab Daily 17)  Triamcinolone Acetonide 0.1 % Oint (Triamcinolone Acetonide) .... Use 1-2 Times A Day For Leg Rash 18)  Mupirocin 2 % Oint (Mupirocin) .... Apply To Sores in Nose, 22 Gm 19)  Calcitriol 0.5 Mcg Caps (Calcitriol) .... Take One Tab 3 Times Weekly 20)  Colcrys 0.6 Mg Tabs (Colchicine) .... Take One Tablet Daily As Directed 21)  Proair Hfa 108 (90 Base) Mcg/act Aers (Albuterol Sulfate) .... 2 Puffs Qid As Needed 22)  Flonase 50 Mcg/act Susp (Fluticasone Propionate) .... Two Squirts in Each Nostril Daily 23)  Polyethylene Glycol 3350  Powd (Polyethylene Glycol 3350) .Marland KitchenMarland KitchenMarland Kitchen 17 Gm in 4 Oz of Liquid Daily, Qs For One Month  Allergies (verified): 1)  ! Novocain 2)  ! Doxycycline Hyclate (Doxycycline Hyclate) 3)  * Clonidine 4)  Codeine 5)  Benadryl 6)  * Tavist 7)  Cipro 8)  Neurontin 9)  Septra Ds (Sulfamethoxazole-Trimethoprim)  Review of Systems General:  Denies fever. GI:  Complains of diarrhea; denies abdominal pain, bloody stools, and dark tarry stools. GU:  Complains of urinary hesitancy; denies dysuria.  Physical Exam  General:  Usual appearing Abdomen:  soft and diffusly tender  Extremities:  tr  edema   Impression & Recommendations:  Problem # 1:  URINARY HESITANCY (ICD-788.64) still a problem, last visit had 0 PVR, and no growth on C&S  Problem # 2:  DIARRHEA, RECURRENT (ICD-787.91)  Recommended avoiding all dairy, no laxative until no BM in 2 days then may start PEG slowly, try no to over shoot the goal  Orders: [facility]- Est Level  3 (14782)  Problem # 3:  CHRONIC KIDNEY DISEASE STAGE III (MODERATE) (ICD-585.3) check labs Orders: Comp Met-[facility] ([id number]) Lipid-[facility] ([id number]) [facility]- Est Level  3 ([id number])  Complete Medication List: 1)  Plavix 75 Mg Tabs (Clopidogrel bisulfate) .... Take one tablet daily 2)  Albuterol 90 Mcg/act Aers (Albuterol)  .... Inhale 2 puff using inhaler four times a day 3)  Buspirone Hcl 15 Mg Tabs (Buspirone hcl) .... Take 1 tablet by mouth twice a day 4)  Ecotrin Low Strength 81 Mg Tbec (Aspirin) .... Take 1 tablet by mouth once a day 5)  Florastor (saccharomyces Boulardii) Caps  .... Take 1 packet by mouth twice a day 6)  Metoprolol Succinate 50 Mg Tb24 (Metoprolol succinate) .... Take 1 tablet by mouth once a day 7)  Simvastatin 40 Mg Tabs (Simvastatin) .... One daily 8)  Lasix 20 Mg Tabs (Furosemide) .... Take 2  tablets by mouth each a.m. 9)  Vitamin B-6 100 Mg Tabs (Pyridoxine hcl) .... Take one tablet daily 10)  Allopurinol 100 Mg Tabs (Allopurinol) .... 1.5 daily 11)  Omeprazole 20 Mg Cpdr (Omeprazole) .... Take one tab two times a day 12)  Tramadol-acetaminophen 37.5-325 Mg Tabs (Tramadol-acetaminophen) .... Take one tablet every 6 hours as needed pain. 13)  Glyburide 5 Mg Tabs (Glyburide) .... Take 1/2 tab two times a day 14)  Nifedipine 60 Mg Xr24h-tab (Nifedipine) .... Take one tablet daily 15)  Diabetic Shoes  .... Thick nails, callouses 16)  Januvia 100 Mg Tabs (Sitagliptin phosphate) .... Take 1/4 tab daily 17)  Triamcinolone Acetonide 0.1 % Oint (Triamcinolone acetonide) .... Use 1-2 times a day for leg rash 18)  Mupirocin 2 % Oint (Mupirocin) .... Apply to sores in nose, 22 gm 19)  Calcitriol 0.5 Mcg Caps (Calcitriol) .... Take one tab 3 times weekly 20)  Colcrys 0.6 Mg Tabs (Colchicine) .... Take one tablet daily as directed 21)  Proair Hfa 108 (90 Base) Mcg/act Aers (Albuterol sulfate) .... 2 puffs qid as needed 22)  Flonase 50 Mcg/act Susp (Fluticasone propionate) .... Two squirts in each nostril daily 23)  Polyethylene Glycol 3350 Powd (Polyethylene glycol 3350) .Marland KitchenMarland Kitchen. 17 gm in 4 oz of liquid daily, qs for one month  Patient Instructions: 1)  Stop dairy products 2)  Hold laxative until you have no BM for 2 days 3)  Lots of water 4)  Apt later this month

## 2011-01-05 NOTE — Assessment & Plan Note (Signed)
Summary: difficulty voiding/[facility]   Vital Signs:  Patient profile:   77 year old female Menstrual status:  hysterectomy Height:      59.75 inches Weight:      144 pounds Temp:     98.0 degrees F oral BP sitting:   140 / 58  (right arm) Cuff size:   regular  Vitals Entered By: Tessie Fass CMA (January 31, 2010 1:41 PM) CC: dysuria, cold symptoms Is Patient Diabetic? Yes Pain Assessment Patient in pain? no        Primary Care Provider:  Zachery Dauer MD  CC:  dysuria and cold symptoms.  History of Present Illness: 3 days of voiding small amounts, feeling like she has to force her urine out.  Denies dysuria.  Tender over bladder.  Had a loose diarrhea stool several days ago, followed by a soft stool.  Today she feels constipated. She is burping a lot and feels that food is sticking.  She ate an egg with cheese and toast this morning, and has had 4 cups of liquid today/  Feels as if she is developing a sinus problems again, never felt better after her last URI.  Having to work to breathe, especially when she is trying to void.  Habits & Providers  Alcohol-Tobacco-Diet     Tobacco Status: never  Current Medications (verified): 1)  Plavix 75 Mg Tabs (Clopidogrel Bisulfate) .... Take One Tablet Daily 2)  Albuterol 90 Mcg/act Aers (Albuterol) .... Inhale 2 Puff Using Inhaler Four Times A Day 3)  Buspirone Hcl 15 Mg Tabs (Buspirone Hcl) .... Take 1 Tablet By Mouth Twice A Day 4)  Ecotrin Low Strength 81 Mg Tbec (Aspirin) .... Take 1 Tablet By Mouth Once A Day 5)  Florastor  (Saccharomyces Boulardii) Caps .... Take 1 Packet By Mouth Twice A Day 6)  Metoprolol Succinate 50 Mg Tb24 (Metoprolol Succinate) .... Take 1 Tablet By Mouth Once A Day 7)  Simvastatin 40 Mg  Tabs (Simvastatin) .... One Daily 8)  Lasix 20 Mg Tabs (Furosemide) .... Take 2  Tablets By Mouth Each A.m. 9)  Vitamin B-6 100 Mg Tabs (Pyridoxine Hcl) .... Take One Tablet Daily 10)  Allopurinol 100 Mg  Tabs  (Allopurinol) .... 1.5 Daily 11)  Omeprazole 20 Mg Cpdr (Omeprazole) .... Take One Tab Two Times A Day 12)  Tramadol-Acetaminophen 37.5-325 Mg Tabs (Tramadol-Acetaminophen) .... Take One Tablet Every 6 Hours As Needed Pain. 13)  Glyburide 5 Mg Tabs (Glyburide) .... Take 1/2 Tab Two Times A Day 14)  Nifedipine 60 Mg Xr24h-Tab (Nifedipine) .... Take One Tablet Daily 15)  Diabetic Shoes .... Thick Nails, Callouses 16)  Januvia 100 Mg Tabs (Sitagliptin Phosphate) .... Take 1/4 Tab Daily 17)  Triamcinolone Acetonide 0.1 % Oint (Triamcinolone Acetonide) .... Use 1-2 Times A Day For Leg Rash 18)  Mupirocin 2 % Oint (Mupirocin) .... Apply To Sores in Nose, 22 Gm 19)  Calcitriol 0.5 Mcg Caps (Calcitriol) .... Take One Tab 3 Times Weekly 20)  Colcrys 0.6 Mg Tabs (Colchicine) .... Take One Tablet Daily As Directed 21)  Proair Hfa 108 (90 Base) Mcg/act Aers (Albuterol Sulfate) .... 2 Puffs Qid As Needed 22)  Flonase 50 Mcg/act Susp (Fluticasone Propionate) .... Two Squirts in Each Nostril Daily 23)  Polyethylene Glycol 3350  Powd (Polyethylene Glycol 3350) .Marland KitchenMarland KitchenMarland Kitchen 17 Gm in 4 Oz of Liquid Daily, Qs For One Month  Allergies (verified): 1)  ! Novocain 2)  ! Doxycycline Hyclate (Doxycycline Hyclate) 3)  * Clonidine 4)  Codeine 5)  Benadryl 6)  * Tavist 7)  Cipro 8)  Neurontin 9)  Septra Ds (Sulfamethoxazole-Trimethoprim)  Review of Systems General:  Denies chills, fever, and sweats. ENT:  Complains of sinus pressure. CV:  Denies chest pain or discomfort, difficulty breathing while lying down, and swelling of feet. Resp:  Complains of shortness of breath. GI:  Complains of abdominal pain; alternating constipation and diarrhea. GU:  Complains of urinary hesitancy; denies dysuria and incontinence.  Physical Exam  General:  Ususal appearing, moderately anxious Ears:  R ear normal and L ear normal.   Lungs:  normal respiratory effort and normal breath sounds.   Heart:  normal rate and regular rhythm.    Abdomen:  Diffuse tenderness, soft Rectal:  small amt of heme neg stool Genitalia:  Grade 2-3 cystocele, catheterized for 10 cc of clear urine   Impression & Recommendations:  Problem # 1:  URINARY HESITANCY (ICD-788.64)  UA normal limits, send for culture.  Check CMET to make sure creatitine is not worsening and she is making less urine.  Consider URO referral for pessry in the future.  Orders: [facility]- Est  Level 4 (16109)  Problem # 2:  ANXIETY (ICD-300.00) Worse today, increase buspirone to three times a day for the short term  Her updated medication list for this problem includes:    Buspirone Hcl 15 Mg Tabs (Buspirone hcl) .Marland Kitchen... Take 1 tablet by mouth twice a day  Problem # 3:  IBS (ICD-564.1) Frequently has these periods, recommend staying on PEG to keep stools moving.  Return to GI Orders: [facility]- Est  Level 4 (60454)  Complete Medication List: 1)  Plavix 75 Mg Tabs (Clopidogrel bisulfate) .... Take one tablet daily 2)  Albuterol 90 Mcg/act Aers (Albuterol) .... Inhale 2 puff using inhaler four times a day 3)  Buspirone Hcl 15 Mg Tabs (Buspirone hcl) .... Take 1 tablet by mouth twice a day 4)  Ecotrin Low Strength 81 Mg Tbec (Aspirin) .... Take 1 tablet by mouth once a day 5)  Florastor (saccharomyces Boulardii) Caps  .... Take 1 packet by mouth twice a day 6)  Metoprolol Succinate 50 Mg Tb24 (Metoprolol succinate) .... Take 1 tablet by mouth once a day 7)  Simvastatin 40 Mg Tabs (Simvastatin) .... One daily 8)  Lasix 20 Mg Tabs (Furosemide) .... Take 2  tablets by mouth each a.m. 9)  Vitamin B-6 100 Mg Tabs (Pyridoxine hcl) .... Take one tablet daily 10)  Allopurinol 100 Mg Tabs (Allopurinol) .... 1.5 daily 11)  Omeprazole 20 Mg Cpdr (Omeprazole) .... Take one tab two times a day 12)  Tramadol-acetaminophen 37.5-325 Mg Tabs (Tramadol-acetaminophen) .... Take one tablet every 6 hours as needed pain. 13)  Glyburide 5 Mg Tabs (Glyburide) .... Take 1/2 tab two times a day 14)   Nifedipine 60 Mg Xr24h-tab (Nifedipine) .... Take one tablet daily 15)  Diabetic Shoes  .... Thick nails, callouses 16)  Januvia 100 Mg Tabs (Sitagliptin phosphate) .... Take 1/4 tab daily 17)  Triamcinolone Acetonide 0.1 % Oint (Triamcinolone acetonide) .... Use 1-2 times a day for leg rash 18)  Mupirocin 2 % Oint (Mupirocin) .... Apply to sores in nose, 22 gm 19)  Calcitriol 0.5 Mcg Caps (Calcitriol) .... Take one tab 3 times weekly 20)  Colcrys 0.6 Mg Tabs (Colchicine) .... Take one tablet daily as directed 21)  Proair Hfa 108 (90 Base) Mcg/act Aers (Albuterol sulfate) .... 2 puffs qid as needed 22)  Flonase 50 Mcg/act Susp (Fluticasone propionate) .Marland KitchenMarland KitchenMarland Kitchen  Two squirts in each nostril daily 23)  Polyethylene Glycol 3350 Powd (Polyethylene glycol 3350) .Marland KitchenMarland Kitchen. 17 gm in 4 oz of liquid daily, qs for one month  Other Orders: Urinalysis-[facility] (00000) Urine Culture-[facility] ([id number]) Comp Met-[facility] ([id number])  Patient Instructions: 1)  Drink fluids to help urine flow 2)  Will call with labs  Laboratory Results   Urine Tests  Date/Time Received: January 31, 2010 2:03 PM  Date/Time Reported: January 31, 2010 2:11 PM   Routine Urinalysis   Color: yellow Appearance: Clear Glucose: negative   (Normal Range: Negative) Bilirubin: small-icto negative   (Normal Range: Negative) Ketone: negative   (Normal Range: Negative) Spec. Gravity: 1.015   (Normal Range: 1.003-1.035) Blood: negative   (Normal Range: Negative) pH: 5.0   (Normal Range: 5.0-8.0) Protein: 30   (Normal Range: Negative) Urobilinogen: 0.2   (Normal Range: 0-1) Nitrite: negative   (Normal Range: Negative) Leukocyte Esterace: negative   (Normal Range: Negative)  Urine Microscopic WBC/HPF: rare RBC/HPF: rare Bacteria/HPF: 1+ Mucous/HPF: trace Epithelial/HPF: rare    Comments: Cath speciment.  Urine sent for culture ...........test performed by...........Marland KitchenTerese Door, CMA

## 2011-01-05 NOTE — Progress Notes (Signed)
Summary: Triage difficulty voiding  Phone Note Call from Patient Call back at Home Phone [phone]   Reason for Call: Talk to Nurse Summary of Call: pt thinks she is having problems with her kidneys again Initial call taken by: Knox Royalty,  January 31, 2010 11:22 AM  Follow-up for Phone Call        difficulty voiding x 3 days. has to apply pressure to lower abd in order to make it come out. c/o still beingl constipated. takes the fiber powder every other day otherwise she has diarrhea.  appt at 1:30 with [facility] for c/o voiding issues Follow-up by: Golden Circle RN,  January 31, 2010 11:45 AM

## 2011-01-05 NOTE — Assessment & Plan Note (Signed)
Summary: fu sinusitis/kh   Vital Signs:  Patient profile:   77 year old female Menstrual status:  hysterectomy Height:      59.75 inches Weight:      143.9 pounds Temp:     97.7 degrees F oral Pulse rate:   69 / minute BP sitting:   117 / 61  (right arm)  Vitals Entered By: Arlyss Repress CMA, (January 20, 2010 11:04 AM) CC: f/up last OV. c/o low energy. does not feel good and stays in bed until lunch time. Is Patient Diabetic? Yes Pain Assessment Patient in pain? no        Primary Care Provider:  Zachery Dauer MD  CC:  f/up last OV. c/o low energy. does not feel good and stays in bed until lunch time.Marland Kitchen  History of Present Illness: Her sinuses got better on the Avelox, but still feels congested but can't blow mucous out. Hoarse. Burping all night long. Spending hours in bed because doesn't sleep well until the early morning hours. Feels weak and tired.   No diarrhea. Rash resolved.   She is to get labs tomorrow for her renal follow-up with Dr Caryn Section next week   Habits & Providers  Alcohol-Tobacco-Diet     Tobacco Status: never  Current Medications (verified): 1)  Plavix 75 Mg Tabs (Clopidogrel Bisulfate) .... Take One Tablet Daily 2)  Albuterol 90 Mcg/act Aers (Albuterol) .... Inhale 2 Puff Using Inhaler Four Times A Day 3)  Buspirone Hcl 15 Mg Tabs (Buspirone Hcl) .... Take 1 Tablet By Mouth Twice A Day 4)  Ecotrin Low Strength 81 Mg Tbec (Aspirin) .... Take 1 Tablet By Mouth Once A Day 5)  Florastor  (Saccharomyces Boulardii) Caps .... Take 1 Packet By Mouth Twice A Day 6)  Metoprolol Succinate 50 Mg Tb24 (Metoprolol Succinate) .... Take 1 Tablet By Mouth Once A Day 7)  Simvastatin 40 Mg  Tabs (Simvastatin) .... One Daily 8)  Lasix 20 Mg Tabs (Furosemide) .... Take 2  Tablets By Mouth Each A.m. 9)  Vitamin B-6 100 Mg Tabs (Pyridoxine Hcl) .... Take One Tablet Daily 10)  Allopurinol 100 Mg  Tabs (Allopurinol) .... 1.5 Daily 11)  Omeprazole 20 Mg Cpdr (Omeprazole) ....  Take One Tab Two Times A Day 12)  Tramadol-Acetaminophen 37.5-325 Mg Tabs (Tramadol-Acetaminophen) .... Take One Tablet Every 6 Hours As Needed Pain. 13)  Glyburide 5 Mg Tabs (Glyburide) .... Take 1/2 Tab Two Times A Day 14)  Nifedipine 60 Mg Xr24h-Tab (Nifedipine) .... Take One Tablet Daily 15)  Diabetic Shoes .... Thick Nails, Callouses 16)  Januvia 100 Mg Tabs (Sitagliptin Phosphate) .... Take 1/4 Tab Daily 17)  Triamcinolone Acetonide 0.1 % Oint (Triamcinolone Acetonide) .... Use 1-2 Times A Day For Leg Rash 18)  Mupirocin 2 % Oint (Mupirocin) .... Apply To Sores in Nose, 22 Gm 19)  Calcitriol 0.5 Mcg Caps (Calcitriol) .... Take One Tab 3 Times Weekly 20)  Colcrys 0.6 Mg Tabs (Colchicine) .... Take One Tablet Daily As Directed 21)  Proair Hfa 108 (90 Base) Mcg/act Aers (Albuterol Sulfate) .... 2 Puffs Qid As Needed 22)  Flonase 50 Mcg/act Susp (Fluticasone Propionate) .... Two Squirts in Each Nostril Daily 23)  Polyethylene Glycol 3350  Powd (Polyethylene Glycol 3350) .Marland KitchenMarland KitchenMarland Kitchen 17 Gm in 4 Oz of Liquid Daily, Qs For One Month  Allergies (verified): 1)  ! Novocain 2)  ! Doxycycline Hyclate (Doxycycline Hyclate) 3)  * Clonidine 4)  Codeine 5)  Benadryl 6)  * Tavist 7)  Cipro  8)  Neurontin 9)  Septra Ds (Sulfamethoxazole-Trimethoprim)  Physical Exam  General:  alert and well-nourished.  Not pale Eyes:  vision grossly intact, pupils equal, pupils round, and pupils reactive to light.   Ears:  TM grey and flat Nose:  External nasal examination shows no deformity or inflammation. Nasal mucosa are pink and moist without lesions or exudates. Mouth:  Oral mucosa and oropharynx without lesions or exudates.  Teeth in good repair. Neck:  No deformities, masses, or tenderness noted. Lungs:  normal respiratory effort and normal breath sounds.   Heart:  normal rate and regular rhythm.   Abdomen:  Soft, 8 cm horizontal scar above the pubis, mildly ttp of lower abd, no rebound or distention, no  palpable distended bladder or stool. left post flank bulge,  not wearing a binder today Extremities:  trace left pedal edema and trace right pedal edema.     Impression & Recommendations:  Problem # 1:  UNSPECIFIED SINUSITIS (ICD-473.9) Assessment Improved  Persisting congestion Her updated medication list for this problem includes:    Flonase 50 Mcg/act Susp (Fluticasone propionate) .Marland Kitchen..Marland Kitchen Two squirts in each nostril daily  Orders: [facility]- Est Level  3 (16109)  Problem # 2:  CHRONIC KIDNEY DISEASE STAGE III (MODERATE) (ICD-585.3) Renal labs and CBC? tomorrow for Dr Caryn Section. Letter printed with prior labs for her to carry to him.   Problem # 3:  ACUTE GOUTY ARTHROPATHY (ICD-274.01) Assessment: Improved  Her updated medication list for this problem includes:    Allopurinol 100 Mg Tabs (Allopurinol) .Marland Kitchen... 1.5 daily    Colcrys 0.6 Mg Tabs (Colchicine) .Marland Kitchen... Take one tablet daily as directed  Problem # 4:  GASTROESOPHAGEAL REFLUX, NO ESOPHAGITIS (ICD-530.81)  Her symptoms have recurred despite PPI Her updated medication list for this problem includes:    Omeprazole 20 Mg Cpdr (Omeprazole) .Marland Kitchen... Take one tab two times a day  Orders: [facility]- Est Level  3 (60454)  Complete Medication List: 1)  Plavix 75 Mg Tabs (Clopidogrel bisulfate) .... Take one tablet daily 2)  Albuterol 90 Mcg/act Aers (Albuterol) .... Inhale 2 puff using inhaler four times a day 3)  Buspirone Hcl 15 Mg Tabs (Buspirone hcl) .... Take 1 tablet by mouth twice a day 4)  Ecotrin Low Strength 81 Mg Tbec (Aspirin) .... Take 1 tablet by mouth once a day 5)  Florastor (saccharomyces Boulardii) Caps  .... Take 1 packet by mouth twice a day 6)  Metoprolol Succinate 50 Mg Tb24 (Metoprolol succinate) .... Take 1 tablet by mouth once a day 7)  Simvastatin 40 Mg Tabs (Simvastatin) .... One daily 8)  Lasix 20 Mg Tabs (Furosemide) .... Take 2  tablets by mouth each a.m. 9)  Vitamin B-6 100 Mg Tabs (Pyridoxine hcl) .... Take one  tablet daily 10)  Allopurinol 100 Mg Tabs (Allopurinol) .... 1.5 daily 11)  Omeprazole 20 Mg Cpdr (Omeprazole) .... Take one tab two times a day 12)  Tramadol-acetaminophen 37.5-325 Mg Tabs (Tramadol-acetaminophen) .... Take one tablet every 6 hours as needed pain. 13)  Glyburide 5 Mg Tabs (Glyburide) .... Take 1/2 tab two times a day 14)  Nifedipine 60 Mg Xr24h-tab (Nifedipine) .... Take one tablet daily 15)  Diabetic Shoes  .... Thick nails, callouses 16)  Januvia 100 Mg Tabs (Sitagliptin phosphate) .... Take 1/4 tab daily 17)  Triamcinolone Acetonide 0.1 % Oint (Triamcinolone acetonide) .... Use 1-2 times a day for leg rash 18)  Mupirocin 2 % Oint (Mupirocin) .... Apply to sores in nose, 22 gm  19)  Calcitriol 0.5 Mcg Caps (Calcitriol) .... Take one tab 3 times weekly 20)  Colcrys 0.6 Mg Tabs (Colchicine) .... Take one tablet daily as directed 21)  Proair Hfa 108 (90 Base) Mcg/act Aers (Albuterol sulfate) .... 2 puffs qid as needed 22)  Flonase 50 Mcg/act Susp (Fluticasone propionate) .... Two squirts in each nostril daily 23)  Polyethylene Glycol 3350 Powd (Polyethylene glycol 3350) .Marland KitchenMarland Kitchen. 17 gm in 4 oz of liquid daily, qs for one month  Patient Instructions: 1)  Please schedule a follow-up appointment in 1 month. We'll check your A1c then.  2)  Gradually increase exercise and humidify the air that you breathe.      Prevention & Chronic Care Immunizations   Influenza vaccine: Fluvax MCR  (09/09/2009)   Influenza vaccine due: 08/04/2010    Tetanus booster: 01/04/2005: Done.   Tetanus booster due: 01/04/2015    Pneumococcal vaccine: Done.  (08/05/1999)   Pneumococcal vaccine deferral: Not indicated  (10/19/2009)   Pneumococcal vaccine due: None    H. zoster vaccine: 10/04/2009: Zostavax  Colorectal Screening   Hemoccult: Done.  (02/01/2002)   Hemoccult due: Not Indicated    Colonoscopy: Done.  (02/01/2002)   Colonoscopy due: 02/02/2012  Other Screening   Pap smear: Done.   (01/04/2006)   Pap smear due: Not Indicated    Mammogram: normal  (09/24/2008)   Mammogram due: 09/24/2009    DXA bone density scan: Done.  (10/04/2000)   DXA bone density action/deferral: Ordered  (07/29/2009)   DXA scan due: None    Smoking status: never  (01/20/2010)  Diabetes Mellitus   HgbA1C: 6.5  (11/19/2009)   Hemoglobin A1C due: Not Indicated    Eye exam: diabetic retinopathy  (07/09/2008)   Eye exam due: 07/09/2009    Foot exam: yes  (07/29/2009)   High risk foot: Not documented   Foot care education: Not documented   Foot exam due: 07/14/2009    Urine microalbumin/creatinine ratio: Not documented   Urine microalbumin/cr due: Not Indicated    Diabetes flowsheet reviewed?: Yes   Progress toward A1C goal: At goal  Lipids   Total Cholesterol: 131  (03/18/2009)   LDL: 70  (03/18/2009)   LDL Direct: 88  (07/28/2008)   HDL: 40  (03/18/2009)   Triglycerides: 104  (03/18/2009)    SGOT (AST): 26  (11/19/2009)   SGPT (ALT): 20  (11/19/2009)   Alkaline phosphatase: 118  (11/19/2009)   Total bilirubin: 0.3  (11/19/2009)    Lipid flowsheet reviewed?: Yes   Progress toward LDL goal: At goal  Hypertension   Last Blood Pressure: 117 / 61  (01/20/2010)   Serum creatinine: 2.38  (01/03/2010)   Serum potassium 3.9  (01/03/2010)    Hypertension flowsheet reviewed?: Yes   Progress toward BP goal: At goal  Self-Management Support :   Personal Goals (by the next clinic visit) :     Personal A1C goal: 7  (07/29/2009)     Personal blood pressure goal: 130/80  (07/29/2009)     Personal LDL goal: 100  (07/29/2009)    Patient will work on the following items until the next clinic visit to reach self-care goals:     Medications and monitoring: bring all of my medications to every visit  (01/20/2010)     Activity: take a 30 minute walk every day  (01/20/2010)    Diabetes self-management support: Education handout  (07/29/2009)    Hypertension self-management support:  Education handout  (07/29/2009)    Lipid self-management  support: Not documented

## 2011-01-05 NOTE — Miscellaneous (Signed)
Summary: Belinda [name] is not scored  Clinical Lists Changes the pharmacist wanted to verify the directions of taking 1/4 tab of januvia. they are not scored so dose will not be an even 1/4 . cost is high where she splits them or not $135. to rpovider to respond.Golden Circle RN  May 12, 2010 12:42 PM   She's been doing it this way for some time and the small dose has been effective.   Appended Document: Belinda [name] is not scored spoke with pharmacist & told her to dispense as written per pcp

## 2011-01-05 NOTE — Letter (Signed)
Summary: Results Follow-up Letter  [facility]  [address]   [place], [place] [address]   Phone: [phone]  Fax: [phone]    03/11/2010  [address] [place], [place]  [address]  Dear Ms. Senna,   The following are the results of your recent test(s): Patient: Belinda [name]   Tests: (1) Basic Metabolic Panel (44010)   Order Note: FASTING   Sodium                    143 mEq/L                   135-145   Potassium                 4.0 mEq/L                   3.5-5.3   Chloride                  108 mEq/L                   96-112   CO2                       22 mEq/L                    19-32   Glucose              [L]  68 mg/dL                    27-25   BUN                  [H]  41 mg/dL                    3-66   Creatinine           [H]  1.81 mg/dL                  0.40-1.20   Calcium                   9.1 mg/dL                   4.4-03.4  Tests: (2) Lipid Profile (74259)   Cholesterol               142 mg/dL                   5-638     ATP III Classification:           < 200        mg/dL        Desirable          200 - 239     mg/dL        Borderline High          >= 240        mg/dL        High         Triglyceride         [H]  177 mg/dL                   <756   HDL Cholesterol      [L]  35 mg/dL                    >  39   Total Chol/HDL Ratio      4.1 Ratio  VLDL Cholesterol (Calc)                             35 mg/dL                    8-29  LDL Cholesterol (Calc)                             72 mg/dL                    5-62           Total Cholesterol/HDL Ratio:CHD Risk                            Coronary Heart Disease Risk Table                                            Men       Women              1/2 Average Risk              3.4        3.3                  Average Risk              5.0        4.4              2 X Average Risk              9.6        7.1              3 X Average Risk             23.4       11.0     Use the calculated  Patient Ratio above and the CHD Risk table      to determine the patient's CHD Risk.     ATP III Classification (LDL):           < 100        mg/dL         Optimal          100 - 129     mg/dL         Near or Above Optimal          130 - 159     mg/dL         Borderline High          160 - 189     mg/dL         High           > 190        mg/dL         Very High   Document Creation Date: 03/10/2010 11:27 PM ________________________________________________________________ Sincerely,  Zachery Dauer MD [facility]           Appended Document: Results Follow-up Letter faxed to Dr. Marina Gravel

## 2011-01-05 NOTE — Assessment & Plan Note (Signed)
Summary: workin for pain/hale/bmc    Vital Signs:  Patient profile:   77 year old female Menstrual status:  hysterectomy Height:      59.75 inches Weight:      140.5 pounds BMI:     27.77 Pulse rate:   80 / minute BP sitting:   120 / 74  (right arm) BP standing:   150 / 52  Vitals Entered By: Arlyss Repress CMA, (December 20, 2010 10:45 AM)  Serial Vital Signs/Assessments:  Time      Position  BP       Pulse  Resp  Temp     By           Lying RA  158/60                         Tessie Fass CMA           Sitting   154/58                         Tessie Fass CMA           Standing  150/52                         Tessie Fass CMA  CC: f/up fall on friday. left shoulder pain. Is Patient Diabetic? No Pain Assessment Patient in pain? yes     Location: left shoulder Intensity: 9 Onset of pain  friday   Primary Care Provider:  Zachery Dauer MD  CC:  f/up fall on friday. left shoulder pain.Marland Kitchen  History of Present Illness: 77 yo F:  1. s/p Fall: x 4 days ago (Friday night), went into to kitchen to make drink, was standing at counter top, turned around and became dizzy, had sensation of room spinning, tried to walk forward to take drink to living room, fell forward. She thinks that she landed on her right side, but can't be sure. No immediate pain. No head trauma or LOC. She tried to stand by pulling herself up on chair, but was unable to do so. She found a towel on the floor and was able to "scoot" herself around the house. She was unable to reach a phone and was down for about 14-16 hours until her family came by the next morning. They lifting her to a chair, she felt some new pain in her LEFT shoulder, was very thirsty, and "needed to pee but I dribbled some."   She denies HA, ear pain, tinnitus, ear fullness, loss of hearing, change in vision, URI s/s, CP, increased SOB, V/D/C, increased LE edema, abdominal pain, focal neurological deficits. She is A&O x 3. No contusions. She  endorses intermittent dizziness with last episode this am, but improved after eating a candybar. She endorses nausea associated with dizziness.  2. Social: The patient lives at home alone, her son lives "300 ft" away and sees or talks to her daily. She has family help with groceries, cooking, and cleaning. She has no HHRN/aid. She normally walks without an assist, but has recently been using a 4 point cane and her son is getting her a walker from storage. SHE IS STILL DRIVING.  3. CKDIII: No change in urination. No dysuria. Followed by [facility].  4. DM: Patient not on medication. Hx of hypoglycemia. A1c 6.7 at last check. Has peripheral neuropathy, nephropathy, and retinopathy.  5. HTN: Patient has been out  of Nifedipine for several days. Her SBP is 120 this am.    Habits & Providers  Alcohol-Tobacco-Diet     Tobacco Status: never     Passive Smoke Exposure: yes  Current Medications (verified): 1)  Plavix 75 Mg Tabs (Clopidogrel Bisulfate) .... Take One Tablet Daily 2)  Proair Hfa 108 (90 Base) Mcg/act Aers (Albuterol Sulfate) .... Take 2 Puffs Q4h Prn 3)  Buspirone Hcl 15 Mg Tabs (Buspirone Hcl) .... Take 1 Tablet By Mouth Twice A Day 4)  Ecotrin Low Strength 81 Mg Tbec (Aspirin) .... Take 1 Tablet By Mouth Once A Day 5)  Metoprolol Succinate 50 Mg Tb24 (Metoprolol Succinate) .... Take 1 Tablet By Mouth Once A Day 6)  Simvastatin 40 Mg  Tabs (Simvastatin) .... One Daily 7)  Vitamin B-6 100 Mg Tabs (Pyridoxine Hcl) .... Take One Tablet Daily 8)  Allopurinol 100 Mg  Tabs (Allopurinol) .Marland Kitchen.. 1 Daily 9)  Nifedipine 60 Mg Xr24h-Tab (Nifedipine) .... Take One Tablet Daily 10)  Triamcinolone Acetonide 0.1 % Oint (Triamcinolone Acetonide) .... Use 1-2 Times A Day For Leg Rash 11)  Calcitriol 0.5 Mcg Caps (Calcitriol) .... Take One Tab 3 Times Weekly 12)  Flonase 50 Mcg/act Susp (Fluticasone Propionate) .... Two Squirts in Each Nostril Daily 13)  Polyethylene Glycol 3350  Powd (Polyethylene Glycol  3350) .Marland KitchenMarland KitchenMarland Kitchen 17 Gm in 4 Oz of Liquid Daily, Qs For One Month 14)  Loperamide Hcl 2 Mg Caps (Loperamide Hcl) .... Take 3 Then One After Each Loose Bowel Movement 15)  Cetirizine Hcl 10 Mg Tabs (Cetirizine Hcl) .... Otc One Qhs 16)  Isosorbide Mononitrate Cr 30 Mg Xr24h-Tab (Isosorbide Mononitrate) .... One Q Morning 17)  Nitrostat 0.4 Mg Subl (Nitroglycerin) .... One Under Tongue As Needed For Chest Pain, May Repeat in 5 Minutes Twice But If Still Having Problems Access Emergency Care, 1 Bottle 18)  Bayer Contour Test  Strp (Glucose Blood) .... Test Fasting and When Symptoms of Low Blood Sugar 19)  Micatin 2 % Crea (Miconazole Nitrate) .... Apply Two Times A Day 20)  Metamucil 30.9 % Powd (Psyllium) .... One Tbsp in Glass of Water Daily 21)  Tramadol Hcl 50 Mg Tabs (Tramadol Hcl) .... Take One Tab Every 6 Hours As Needed 22)  Acetaminophen 325 Mg Tabs (Acetaminophen) .... Take 2 Tabs Every 6 Hr As Needed Pain. 23)  Protonix 40 Mg Tbec (Pantoprazole Sodium) .... Take One Tab Daily  Allergies (verified): 1)  ! Novocain 2)  ! Doxycycline Hyclate (Doxycycline Hyclate) 3)  * Clonidine 4)  Codeine 5)  Benadryl 6)  * Tavist 7)  Cipro 8)  Neurontin 9)  Septra Ds (Sulfamethoxazole-Trimethoprim) PMH-FH-SH reviewed for relevance  Social History: Passive Smoke Exposure:  yes  Review of Systems      See HPI  Physical Exam  General:  Well-developed, well-nourished, in no acute distress; alert, appropriate and cooperative throughout examination. Vitals reviewed. Head:  Normocephalic and atraumatic without obvious abnormalities.  Eyes:  Vision grossly intact, pupils equal, pupils round, and pupils reactive to light.   Ears:  R ear normal and L ear normal TMs but superficial excoriated areas inside pinnae. Nose:  External nasal examination shows no deformity or inflammation. Nasal mucosa are pink and moist without lesions or exudates. Mouth:  MMM. Neck:  No deformities, masses, or tenderness  noted. Lungs:  Normal respiratory effort, normal breath sounds, and no crackles.   Heart:  Normal rate and regular rhythm.   Abdomen:  Soft left flank bulge above surgical  scar.  Rectal:  Some redness at top of gluteal cleft where she was scooting herself. Msk:  Left shoulder with generalized TTP. Good ROM considering endorsed amount of pain. Decreased internal rotation. Right shoulder with good ROM, but endorsed pain. No TTP along cervical spine.   Unable to rise from chair without using arms for support, weak when standing - in stooped position. Needs assist to walk. Pulses:  2+ DP. Extremities:  1+ right ankle and 1+ left ankle edema. Neurologic:  Alert & oriented X3, cranial nerves II-XII intact, strength symmetric in all extremities, and DTRs symmetrical and normal.   Skin:  See rectal. Psych:  Oriented X3, memory intact for recent and remote, normally interactive, good eye contact, not anxious appearing, and not depressed appearing.     Impression & Recommendations:  Problem # 1:  ACCIDENTAL FALL (ICD-E888.9) Assessment New Multifactorial. Risks include antiHTN medications, deconditioning, neuropathy, nephropathy, Hx of hypoglycemia. Vertigo may be contributor as patient endorses recent viral infection. Note: Review of Hx - patient with fall in early Dec and was "laid up for 2 weeks," then had viral illness and "was laid up again." I believe that deconditioning is a critical issue to address. Discussed PT for balance and strength. Potential consequences of another fall (fracture). Patient with osteopenia. Will order HHPT and safety evaluation. Patient's son is getting a walker for her to use. He is also getting MedAlert. Advised patient to f/u soon with Dr. Sheffield Slider for future plan. Orders: CK (Creatine Kinase)-[facility] [phone]) [facility]- Est  Level 4 (99214) Home Health Referral (Home Health)  Problem # 2:  UNSPECIFIED DEBILITY (ICD-799.3) Assessment: New  See #1.  Orders: [facility]- Est   Level 4 (99214) Home Health Referral (Home Health)  Problem # 3:  DIABETES MELLITUS, II, COMPLICATIONS (ICD-250.92) Assessment: Unchanged  Her updated medication list for this problem includes:    Ecotrin Low Strength 81 Mg Tbec (Aspirin) .Marland Kitchen... Take 1 tablet by mouth once a day  Orders: [facility]- Est  Level 4 (32440)  Problem # 4:  HYPERTENSION (ICD-401.9) Assessment: Unchanged Advised patient to hold Nifedipine until she is seen by PCP/cards. Negative orthostatics today. Her updated medication list for this problem includes:    Metoprolol Succinate 50 Mg Tb24 (Metoprolol succinate) .Marland Kitchen... Take 1 tablet by mouth once a day    Nifedipine 60 Mg Xr24h-tab (Nifedipine) .Marland Kitchen... Take one tablet daily  Orders: Basic Met-[facility] ([id number]) [facility]- Est  Level 4 (40347)  Problem # 5:  CHRONIC KIDNEY DISEASE STAGE III (MODERATE) (ICD-585.3) Assessment: Unchanged  Check Cr and CK as patient down for 14-16 hours.  Orders: [facility]- Est  Level 4 (42595)  Complete Medication List: 1)  Plavix 75 Mg Tabs (Clopidogrel bisulfate) .... Take one tablet daily 2)  Proair Hfa 108 (90 Base) Mcg/act Aers (Albuterol sulfate) .... Take 2 puffs q4h prn 3)  Buspirone Hcl 15 Mg Tabs (Buspirone hcl) .... Take 1 tablet by mouth twice a day 4)  Ecotrin Low Strength 81 Mg Tbec (Aspirin) .... Take 1 tablet by mouth once a day 5)  Metoprolol Succinate 50 Mg Tb24 (Metoprolol succinate) .... Take 1 tablet by mouth once a day 6)  Simvastatin 40 Mg Tabs (Simvastatin) .... One daily 7)  Vitamin B-6 100 Mg Tabs (Pyridoxine hcl) .... Take one tablet daily 8)  Allopurinol 100 Mg Tabs (Allopurinol) .Marland Kitchen.. 1 daily 9)  Nifedipine 60 Mg Xr24h-tab (Nifedipine) .... Take one tablet daily 10)  Triamcinolone Acetonide 0.1 % Oint (Triamcinolone acetonide) .... Use 1-2 times a day  for leg rash 11)  Calcitriol 0.5 Mcg Caps (Calcitriol) .... Take one tab 3 times weekly 12)  Flonase 50 Mcg/act Susp (Fluticasone propionate) .... Two squirts in each  nostril daily 13)  Polyethylene Glycol 3350 Powd (Polyethylene glycol 3350) .Marland KitchenMarland Kitchen. 17 gm in 4 oz of liquid daily, qs for one month 14)  Loperamide Hcl 2 Mg Caps (Loperamide hcl) .... Take 3 then one after each loose bowel movement 15)  Cetirizine Hcl 10 Mg Tabs (Cetirizine hcl) .... Otc one qhs 16)  Isosorbide Mononitrate Cr 30 Mg Xr24h-tab (Isosorbide mononitrate) .... One q morning 17)  Nitrostat 0.4 Mg Subl (Nitroglycerin) .... One under tongue as needed for chest pain, may repeat in 5 minutes twice but if still having problems access emergency care, 1 bottle 18)  Bayer Contour Test Strp (Glucose blood) .... Test fasting and when symptoms of low blood sugar 19)  Micatin 2 % Crea (Miconazole nitrate) .... Apply two times a day 20)  Metamucil 30.9 % Powd (Psyllium) .... One tbsp in glass of water daily 21)  Tramadol Hcl 50 Mg Tabs (Tramadol hcl) .... Take one tab every 6 hours as needed 22)  Acetaminophen 325 Mg Tabs (Acetaminophen) .... Take 2 tabs every 6 hr as needed pain. 23)  Protonix 40 Mg Tbec (Pantoprazole sodium) .... Take one tab daily  Other Orders: CBC-[facility] (09811) TSH-[facility] ([id number])  Patient Instructions: 1)  It was nice to meet you today! 2)  Make an appointment to [facility]. 3)  We are checking labs. 4)  I recommend physical therapy for strength and balance. 5)  Contact the [facility].  6)  If your symptoms worsen, please call us or go to the emergency department.  7)  Hold on taking your Nifedipine.   Orders Added: 1)  Basic Met-[facility] [[id number]] 2)  CBC-[facility] [85027] 3)  TSH-[facility] [[id number]] 4)  CK (Creatine Kinase)-[facility] [[id number]] 5)  [facility]- Est  Level 4 [99214] 6)  Home Health Referral [facility]]

## 2011-01-05 NOTE — Assessment & Plan Note (Signed)
Summary: Belinda [name]   Vital Signs:  Patient profile:   77 year old female Menstrual status:  hysterectomy Weight:      140.8 pounds Pulse rate:   76 / minute BP sitting:   134 / 67  (right arm)  Vitals Entered By: Renato Battles slade,cma CC: f/up visit. kidneys. low back pain worse after not being able to see the chiropractor. needs knee replacement per murphy/wainer. but, does not want knee replacement. Is Patient Diabetic? Yes Pain Assessment Patient in pain? yes     Location: lower back Intensity: 7 Onset of pain  Chronic   Primary Care Provider:  Zachery Dauer MD  CC:  f/up visit. kidneys. low back pain worse after not being able to see the chiropractor. needs knee replacement per murphy/wainer. but and does not want knee replacement..  History of Present Illness: Left SI joint area causing pain, went to chiropracter who used ultrasound.  Ankles are a little swollen, still on furosemide 20 mg every other day  Vision is not very good, receiving injections for wet macular degeneration  Having less chest discomfort on long acting nitrate, denies bouts of dizziness or new headache  Habits & Providers  Alcohol-Tobacco-Diet     Tobacco Status: never  Current Medications (verified): 1)  Plavix 75 Mg Tabs (Clopidogrel Bisulfate) .... Take One Tablet Daily 2)  Albuterol 90 Mcg/act Aers (Albuterol) .... Inhale 2 Puff Using Inhaler Four Times A Day 3)  Buspirone Hcl 15 Mg Tabs (Buspirone Hcl) .... Take 1 Tablet By Mouth Twice A Day 4)  Ecotrin Low Strength 81 Mg Tbec (Aspirin) .... Take 1 Tablet By Mouth Once A Day 5)  Florastor  (Saccharomyces Boulardii) Caps .... Take 1 Packet By Mouth Twice A Day 6)  Metoprolol Succinate 50 Mg Tb24 (Metoprolol Succinate) .... Take 1 Tablet By Mouth Once A Day 7)  Simvastatin 40 Mg  Tabs (Simvastatin) .... One Daily 8)  Lasix 20 Mg Tabs (Furosemide) .... Take 2  Tablets By Mouth Each A.m. 9)  Vitamin B-6 100 Mg Tabs (Pyridoxine Hcl) .... Take One  Tablet Daily 10)  Allopurinol 100 Mg  Tabs (Allopurinol) .... 1.5 Daily 11)  Omeprazole 20 Mg Cpdr (Omeprazole) .... Take One Tab Two Times A Day 12)  Tramadol-Acetaminophen 37.5-325 Mg Tabs (Tramadol-Acetaminophen) .... Take One Tablet Every 6 Hours As Needed Pain. 13)  Glyburide 5 Mg Tabs (Glyburide) .... Take 1/4  Tab One Times A Day 14)  Nifedipine 60 Mg Xr24h-Tab (Nifedipine) .... Take One Tablet Daily 15)  Diabetic Shoes .... Thick Nails, Callouses 16)  Januvia 100 Mg Tabs (Sitagliptin Phosphate) .... Take 1/4 Tab Daily 17)  Triamcinolone Acetonide 0.1 % Oint (Triamcinolone Acetonide) .... Use 1-2 Times A Day For Leg Rash 18)  Mupirocin 2 % Oint (Mupirocin) .... Apply To Sores in Nose, 22 Gm 19)  Calcitriol 0.5 Mcg Caps (Calcitriol) .... Take One Tab 3 Times Weekly 20)  Colcrys 0.6 Mg Tabs (Colchicine) .... Take One Tablet Daily As Directed 21)  Proair Hfa 108 (90 Base) Mcg/act Aers (Albuterol Sulfate) .... 2 Puffs Qid As Needed 22)  Flonase 50 Mcg/act Susp (Fluticasone Propionate) .... Two Squirts in Each Nostril Daily 23)  Polyethylene Glycol 3350  Powd (Polyethylene Glycol 3350) .Marland KitchenMarland KitchenMarland Kitchen 17 Gm in 4 Oz of Liquid Daily, Qs For One Month 24)  Loperamide Hcl 2 Mg Caps (Loperamide Hcl) .... Take 3 Then One After Each Loose Bowel Movement 25)  Cetirizine Hcl 10 Mg Tabs (Cetirizine Hcl) .... Otc One Qhs 26)  Isosorbide Mononitrate Cr 30 Mg Xr24h-Tab (Isosorbide Mononitrate) .... One Q Morning 27)  Nitrostat 0.4 Mg Subl (Nitroglycerin) .... One Under Tongue As Needed For Chest Pain, May Repeat in 5 Minutes Twice But If Still Having Problems Access Emergency Care, 1 Bottle  Allergies (verified): 1)  ! Novocain 2)  ! Doxycycline Hyclate (Doxycycline Hyclate) 3)  * Clonidine 4)  Codeine 5)  Benadryl 6)  * Tavist 7)  Cipro 8)  Neurontin 9)  Septra Ds (Sulfamethoxazole-Trimethoprim)  Physical Exam  General:  Ususal appearing Lungs:  normal respiratory effort, normal breath sounds, and no  crackles.   Heart:  normal rate, regular rhythm, and no murmur.   Msk:  Tender left Si joint, discomfort when stressing Extremities:  tr edema at ankles, no rashes Psych:  moderately anxious.     Impression & Recommendations:  Problem # 1:  VENOUS INSUFFICIENCY (ICD-459.81)  She is not wearing compression stockings  Orders: [facility]- Est Level  3 (04540)  Problem # 2:  ANXIETY (ICD-300.00)  more so today than usual Her updated medication list for this problem includes:    Buspirone Hcl 15 Mg Tabs (Buspirone hcl) .Marland Kitchen... Take 1 tablet by mouth twice a day  Orders: [facility]- Est Level  3 (98119)  Problem # 3:  CHRONIC KIDNEY DISEASE STAGE III (MODERATE) (ICD-585.3) creatitine significantly improved on lower furosemide dosing (20 mg every other day), recheck today Orders: Basic Met-[facility] ([id number]) [facility]- Est Level  3 (30865)  Problem # 4:  CONSTIPATION, INTERMITTENT (ICD-564.00)  constipatied for several days, recommended using PEG a few times a week to not overshoot goal and develop loose stools.  Orders: [facility]- Est Level  3 (78469)  Problem # 5:  SACROILIITIS, LEFT (ICD-720.2) Generally visits chiropractor for this.  Ice to area, tramadol as needed, Tylenol on a routine schedule.  Complete Medication List: 1)  Plavix 75 Mg Tabs (Clopidogrel bisulfate) .... Take one tablet daily 2)  Albuterol 90 Mcg/act Aers (Albuterol) .... Inhale 2 puff using inhaler four times a day 3)  Buspirone Hcl 15 Mg Tabs (Buspirone hcl) .... Take 1 tablet by mouth twice a day 4)  Ecotrin Low Strength 81 Mg Tbec (Aspirin) .... Take 1 tablet by mouth once a day 5)  Florastor (saccharomyces Boulardii) Caps  .... Take 1 packet by mouth twice a day 6)  Metoprolol Succinate 50 Mg Tb24 (Metoprolol succinate) .... Take 1 tablet by mouth once a day 7)  Simvastatin 40 Mg Tabs (Simvastatin) .... One daily 8)  Lasix 20 Mg Tabs (Furosemide) .... Take 2  tablets by mouth each a.m. 9)  Vitamin B-6 100 Mg Tabs (Pyridoxine  hcl) .... Take one tablet daily 10)  Allopurinol 100 Mg Tabs (Allopurinol) .... 1.5 daily 11)  Omeprazole 20 Mg Cpdr (Omeprazole) .... Take one tab two times a day 12)  Tramadol-acetaminophen 37.5-325 Mg Tabs (Tramadol-acetaminophen) .... Take one tablet every 6 hours as needed pain. 13)  Glyburide 5 Mg Tabs (Glyburide) .... Take 1/4  tab one times a day 14)  Nifedipine 60 Mg Xr24h-tab (Nifedipine) .... Take one tablet daily 15)  Diabetic Shoes  .... Thick nails, callouses 16)  Januvia 100 Mg Tabs (Sitagliptin phosphate) .... Take 1/4 tab daily 17)  Triamcinolone Acetonide 0.1 % Oint (Triamcinolone acetonide) .... Use 1-2 times a day for leg rash 18)  Mupirocin 2 % Oint (Mupirocin) .... Apply to sores in nose, 22 gm 19)  Calcitriol 0.5 Mcg Caps (Calcitriol) .... Take one tab 3 times weekly 20)  Colcrys  0.6 Mg Tabs (Colchicine) .... Take one tablet daily as directed 21)  Proair Hfa 108 (90 Base) Mcg/act Aers (Albuterol sulfate) .... 2 puffs qid as needed 22)  Flonase 50 Mcg/act Susp (Fluticasone propionate) .... Two squirts in each nostril daily 23)  Polyethylene Glycol 3350 Powd (Polyethylene glycol 3350) .Marland KitchenMarland Kitchen. 17 gm in 4 oz of liquid daily, qs for one month 24)  Loperamide Hcl 2 Mg Caps (Loperamide hcl) .... Take 3 then one after each loose bowel movement 25)  Cetirizine Hcl 10 Mg Tabs (Cetirizine hcl) .... Otc one qhs 26)  Isosorbide Mononitrate Cr 30 Mg Xr24h-tab (Isosorbide mononitrate) .... One q morning 27)  Nitrostat 0.4 Mg Subl (Nitroglycerin) .... One under tongue as needed for chest pain, may repeat in 5 minutes twice but if still having problems access emergency care, 1 bottle  Patient Instructions: 1)  Please schedule a follow-up appointment in 1 month.

## 2011-01-05 NOTE — Consult Note (Signed)
Summary: [facility] in 5 mos  [facility]   Imported By: De Nurse 08/05/2010 12:22:31  _____________________________________________________________________  External Attachment:    Type:   Image     Comment:   External Document

## 2011-01-05 NOTE — Consult Note (Signed)
[facility]: [facility]   Imported By: De Nurse 09/21/2010 14:02:44  _____________________________________________________________________  External Attachment:    Type:   Image     Comment:   External Document  Appended Document: [facility] added metamucil    Clinical Lists Changes  Medications: Added new medication of METAMUCIL 30.9 % POWD (PSYLLIUM) one tbsp in glass of water daily

## 2011-01-05 NOTE — Consult Note (Signed)
Summary: [facility] Receive6/29/11  [facility]   Imported By: Clydell Hakim 06/01/2010 15:56:52  _____________________________________________________________________  External Attachment:    Type:   Image     Comment:   External Document

## 2011-01-05 NOTE — Assessment & Plan Note (Signed)
Summary: Dizziness/, URIKF   Vital Signs:  Patient profile:   77 year old female Menstrual status:  hysterectomy Height:      59.75 inches Weight:      142 pounds BMI:     28.07 Temp:     97.8 degrees F Pulse rate:   72 / minute BP sitting:   128 / 62  (right arm)  Vitals Entered By: Arlyss Repress CMA, (December 09, 2009 10:01 AM) CC: chills, low energy and appetite. sore throat x 2 days. nasal drainage worse with eating or drinking. some dizziness. blood glucose up to 229, but last check was 150, 3 days ago. Is Patient Diabetic? Yes Pain Assessment Patient in pain? no        Primary Care Provider:  Zachery Dauer MD  CC:  chills, low energy and appetite. sore throat x 2 days. nasal drainage worse with eating or drinking. some dizziness. blood glucose up to 229, but last check was 150, and 3 days ago.Marland Kitchen  History of Present Illness: Sore throat, chills, voice hoarse, pnd,  dizzy.GERD controlled.  Using chlorasceptic spray with some relief.    Blood glucose 229 before supper 3 days ago then down to 150 couple hours later.    Worried about losing license since she couldn't see the signs on the vision test.  Seeing better since shots in left eye, last Dec 23rd and vision better. right eye very poor vision status post surgery.   No recent chest pain, cough, or asthma  Having problems with bills from [facility] not with the lab Dr Caryn Section uses.    Habits & Providers  Alcohol-Tobacco-Diet     Tobacco Status: never  Current Medications (verified): 1)  Plavix 75 Mg Tabs (Clopidogrel Bisulfate) .... Take One Tablet Daily 2)  Albuterol 90 Mcg/act Aers (Albuterol) .... Inhale 2 Puff Using Inhaler Four Times A Day 3)  Buspirone Hcl 15 Mg Tabs (Buspirone Hcl) .... Take 1 Tablet By Mouth Twice A Day 4)  Ecotrin Low Strength 81 Mg Tbec (Aspirin) .... Take 1 Tablet By Mouth Once A Day 5)  Florastor  (Saccharomyces Boulardii) Caps .... Take 1 Packet By Mouth Twice A Day 6)  Metoprolol  Succinate 50 Mg Tb24 (Metoprolol Succinate) .... Take 1 Tablet By Mouth Once A Day 7)  Simvastatin 40 Mg  Tabs (Simvastatin) .... One Daily 8)  Lasix 20 Mg Tabs (Furosemide) .... Take 2  Tablets By Mouth Each A.m. 9)  Vitamin B-6 100 Mg Tabs (Pyridoxine Hcl) .... Take One Tablet Daily 10)  Allopurinol 100 Mg  Tabs (Allopurinol) .... 1.5 Daily 11)  Omeprazole 20 Mg Cpdr (Omeprazole) .... Take One Tab Two Times A Day 12)  Tramadol-Acetaminophen 37.5-325 Mg Tabs (Tramadol-Acetaminophen) .... Take One Tablet Every 6 Hours As Needed Pain. 13)  Glyburide 5 Mg Tabs (Glyburide) .... Take 1/2 Tab Two Times A Day 14)  Nifedipine 60 Mg Xr24h-Tab (Nifedipine) .... Take One Tablet Daily 15)  Zostavax 04540 Unt/0.75ml Solr (Zoster Vaccine Live) .... Dispense and Inject X 1 16)  Diabetic Shoes .... Thick Nails, Callouses 17)  Januvia 100 Mg Tabs (Sitagliptin Phosphate) .... Take 1/4 Tab Daily 18)  Triamcinolone Acetonide 0.1 % Oint (Triamcinolone Acetonide) .... Use 1-2 Times A Day For Leg Rash 19)  Mupirocin 2 % Oint (Mupirocin) .... Apply To Sores in Nose, 22 Gm 20)  Calcitriol 0.5 Mcg Caps (Calcitriol) .... Take One Tab 3 Times Weekly 21)  Colcrys 0.6 Mg Tabs (Colchicine) .... Take One Tablet  Daily As Directed  Allergies: 1)  ! Novocain 2)  * Clonidine 3)  Codeine 4)  Benadryl 5)  * Tavist 6)  Cipro 7)  Neurontin 8)  Septra Ds (Sulfamethoxazole-Trimethoprim)  Physical Exam  General:  Looked well Eyes:  vision grossly intact, pupils equal, pupils round, and pupils reactive to light.   Ears:  External ear exam shows no significant lesions or deformities.  Otoscopic examination reveals clear canals, tympanic membranes are intact bilaterally without bulging, retraction, inflammation or discharge. Hearing is grossly normal bilaterally. Nose:  External nasal examination shows no deformity or inflammation. Nasal mucosa are pink and moist scab in left nare Mouth:  Oral mucosa and oropharynx without  lesions or exudates.  Teeth in good repair. Lungs:  normal respiratory effort and no intercostal retractions.   Heart:  normal rate and regular rhythm.   Abdomen:  Soft, 8 cm horizontal scar above the pubis, mildly ttp of lower abd, no rebound or distention, no palpable distended bladder or stool. left post flank bulge for which she is wearing a binder Skin:  Thin skin on forearms with superficial ecchymoses and associated brown discoloration. Some petechia in areas of abrasion Psych:  normally interactive, good eye contact, and slightly anxious as usual.     Impression & Recommendations:  Problem # 1:  URI (ICD-465.9)  Continue throat spray. Apply Bacitracin to scab in nose Her updated medication list for this problem includes:    Ecotrin Low Strength 81 Mg Tbec (Aspirin) .Marland Kitchen... Take 1 tablet by mouth once a day  Orders: [facility]- Est  Level 4 (81191)  Problem # 2:  HYPERTENSION (ICD-401.9) Assessment: Unchanged  Her updated medication list for this problem includes:    Metoprolol Succinate 50 Mg Tb24 (Metoprolol succinate) .Marland Kitchen... Take 1 tablet by mouth once a day    Lasix 20 Mg Tabs (Furosemide) .Marland Kitchen... Take 2  tablets by mouth each a.m.    Nifedipine 60 Mg Xr24h-tab (Nifedipine) .Marland Kitchen... Take one tablet daily  Orders: [facility]- Est  Level 4 (99214)  Problem # 3:  CHRONIC KIDNEY DISEASE STAGE III (MODERATE) (ICD-585.3) Assessment: Improved Given letter with labs from December to carry to Dr Caryn Section. Creatinine has been improving  Problem # 4:  VISUAL ACUITY, DECREASED, RIGHT EYE (ICD-369.9) Assessment: Deteriorated Diabetic retinopathy decreasing vision and threatening ability to drive.   Problem # 5:  ACUTE GOUTY ARTHROPATHY (ICD-274.01) No recurrence. Has only been taking the Allopurinol one daily so asked to go back to 1.5 tab of 100 mg daily The following medications were removed from the medication list:    Colchicine 0.6 Mg Tabs (Colchicine) .Marland Kitchen... Take one tablet daily Her updated  medication list for this problem includes:    Allopurinol 100 Mg Tabs (Allopurinol) .Marland Kitchen... 1.5 daily    Colcrys 0.6 Mg Tabs (Colchicine) .Marland Kitchen... Take one tablet daily as directed  Problem # 6:  ATHEROSCLEROTIC CARDIOVASCULAR DISEASE (ICD-429.2)  She will follow-up with Dr Amil Amen next month. No recent chest pain  Her updated medication list for this problem includes:    Plavix 75 Mg Tabs (Clopidogrel bisulfate) .Marland Kitchen... Take one tablet daily    Ecotrin Low Strength 81 Mg Tbec (Aspirin) .Marland Kitchen... Take 1 tablet by mouth once a day    Metoprolol Succinate 50 Mg Tb24 (Metoprolol succinate) .Marland Kitchen... Take 1 tablet by mouth once a day    Lasix 20 Mg Tabs (Furosemide) .Marland Kitchen... Take 2  tablets by mouth each a.m.    Nifedipine 60 Mg Xr24h-tab (Nifedipine) .Marland Kitchen... Take one tablet  daily  Orders: [facility]- Est  Level 4 (54098)  Complete Medication List: 1)  Plavix 75 Mg Tabs (Clopidogrel bisulfate) .... Take one tablet daily 2)  Albuterol 90 Mcg/act Aers (Albuterol) .... Inhale 2 puff using inhaler four times a day 3)  Buspirone Hcl 15 Mg Tabs (Buspirone hcl) .... Take 1 tablet by mouth twice a day 4)  Ecotrin Low Strength 81 Mg Tbec (Aspirin) .... Take 1 tablet by mouth once a day 5)  Florastor (saccharomyces Boulardii) Caps  .... Take 1 packet by mouth twice a day 6)  Metoprolol Succinate 50 Mg Tb24 (Metoprolol succinate) .... Take 1 tablet by mouth once a day 7)  Simvastatin 40 Mg Tabs (Simvastatin) .... One daily 8)  Lasix 20 Mg Tabs (Furosemide) .... Take 2  tablets by mouth each a.m. 9)  Vitamin B-6 100 Mg Tabs (Pyridoxine hcl) .... Take one tablet daily 10)  Allopurinol 100 Mg Tabs (Allopurinol) .... 1.5 daily 11)  Omeprazole 20 Mg Cpdr (Omeprazole) .... Take one tab two times a day 12)  Tramadol-acetaminophen 37.5-325 Mg Tabs (Tramadol-acetaminophen) .... Take one tablet every 6 hours as needed pain. 13)  Glyburide 5 Mg Tabs (Glyburide) .... Take 1/2 tab two times a day 14)  Nifedipine 60 Mg Xr24h-tab  (Nifedipine) .... Take one tablet daily 15)  Zostavax 11914 Unt/0.72ml Solr (Zoster vaccine live) .... Dispense and inject x 1 16)  Diabetic Shoes  .... Thick nails, callouses 17)  Januvia 100 Mg Tabs (Sitagliptin phosphate) .... Take 1/4 tab daily 18)  Triamcinolone Acetonide 0.1 % Oint (Triamcinolone acetonide) .... Use 1-2 times a day for leg rash 19)  Mupirocin 2 % Oint (Mupirocin) .... Apply to sores in nose, 22 gm 20)  Calcitriol 0.5 Mcg Caps (Calcitriol) .... Take one tab 3 times weekly 21)  Colcrys 0.6 Mg Tabs (Colchicine) .... Take one tablet daily as directed  Patient Instructions: 1)  Please schedule a follow-up appointment in 2 months.  2)  Continue throat spray for sore throat and rest voice. Humidify the air in your house.    Prevention & Chronic Care Immunizations   Influenza vaccine: Fluvax MCR  (09/09/2009)   Influenza vaccine due: 08/04/2010    Tetanus booster: 01/04/2005: Done.   Tetanus booster due: 01/04/2015    Pneumococcal vaccine: Done.  (08/05/1999)   Pneumococcal vaccine deferral: Not indicated  (10/19/2009)   Pneumococcal vaccine due: None    H. zoster vaccine: 10/04/2009: Zostavax  Colorectal Screening   Hemoccult: Done.  (02/01/2002)   Hemoccult due: Not Indicated    Colonoscopy: Done.  (02/01/2002)   Colonoscopy due: 02/02/2012  Other Screening   Pap smear: Done.  (01/04/2006)   Pap smear due: Not Indicated    Mammogram: normal  (09/24/2008)   Mammogram due: 09/24/2009    DXA bone density scan: Done.  (10/04/2000)   DXA bone density action/deferral: Ordered  (07/29/2009)   DXA scan due: None    Smoking status: never  (12/09/2009)  Diabetes Mellitus   HgbA1C: 6.5  (11/19/2009)   Hemoglobin A1C due: Not Indicated    Eye exam: diabetic retinopathy  (07/09/2008)   Eye exam due: 07/09/2009    Foot exam: yes  (07/29/2009)   High risk foot: Not documented   Foot care education: Not documented   Foot exam due: 07/14/2009    Urine  microalbumin/creatinine ratio: Not documented   Urine microalbumin/cr due: Not Indicated    Diabetes flowsheet reviewed?: Yes   Progress toward A1C goal: At goal  Lipids  Total Cholesterol: 131  (03/18/2009)   LDL: 70  (03/18/2009)   LDL Direct: 88  (07/28/2008)   HDL: 40  (03/18/2009)   Triglycerides: 104  (03/18/2009)    SGOT (AST): 26  (11/19/2009)   SGPT (ALT): 20  (11/19/2009)   Alkaline phosphatase: 118  (11/19/2009)   Total bilirubin: 0.3  (11/19/2009)    Lipid flowsheet reviewed?: Yes   Progress toward LDL goal: At goal  Hypertension   Last Blood Pressure: 128 / 62  (12/09/2009)   Serum creatinine: 2.37  (11/19/2009)   Serum potassium 4.4  (11/19/2009)    Hypertension flowsheet reviewed?: Yes   Progress toward BP goal: At goal  Self-Management Support :   Personal Goals (by the next clinic visit) :     Personal A1C goal: 7  (07/29/2009)     Personal blood pressure goal: 130/80  (07/29/2009)     Personal LDL goal: 100  (07/29/2009)    Patient will work on the following items until the next clinic visit to reach self-care goals:     Medications and monitoring: bring all of my medications to every visit  (12/09/2009)    Diabetes self-management support: Education handout  (07/29/2009)    Hypertension self-management support: Education handout  (07/29/2009)    Lipid self-management support: Not documented

## 2011-01-05 NOTE — Assessment & Plan Note (Signed)
Summary: swollen legs-see notes/Aloha/hale   Vital Signs:  Patient profile:   77 year old female Menstrual status:  hysterectomy Height:      59.75 inches Weight:      146 pounds BMI:     28.86 Temp:     97.9 degrees F oral Pulse rate:   72 / minute BP sitting:   142 / 54  (right arm) Cuff size:   regular  Vitals Entered By: Tessie Fass CMA (March 04, 2010 1:48 PM) CC: bilateral legs swelling Is Patient Diabetic? Yes Pain Assessment Patient in pain? yes     Location: head   Primary Care Provider:  Zachery Dauer MD  CC:  bilateral legs swelling.  History of Present Illness: Increased swelling of her legs for one week.  Has been off furosemide for several weeks.  Legs are breaking out in the rash that seems to ensue after swelling.  Reprots going all day without voiding, once she lays down for bed she goes every 30 minutes for several hours.  This is interfering with her sleep.  She has tried to remain off dairy.  BLood sugars have been 60-90 range and she has not had any diarrhea.  Her left knee is killing her, she tried to get it injected but her ortho MD is out of town.  She has known pseudo gout, feels that it is flaring.  She cannot afford the brand colchicine, it is 80$ co-pay.  Her hand is also painful.  Her sinuses are flaring, she feels like she is getting that "mess" again.  Sore throat, congestion, itching, sneezing.  Denies fever.    Allergies: 1)  ! Novocain 2)  ! Doxycycline Hyclate (Doxycycline Hyclate) 3)  * Clonidine 4)  Codeine 5)  Benadryl 6)  * Tavist 7)  Cipro 8)  Neurontin 9)  Septra Ds (Sulfamethoxazole-Trimethoprim)  Physical Exam  General:  Usual appearing Lungs:  normal respiratory effort, normal breath sounds, and no crackles.   Heart:  normal rate and regular rhythm.   Extremities:  1+ pedal and pretibial edema, venous stasis dermatitis from knee to foot   Impression & Recommendations:  Problem # 1:  ACUTE GOUTY ARTHROPATHY  (ICD-274.01)  Unable to afford Colcrys ($89), low dose prednisone burst at 20 mg daily for 5 days.  Follow up one week.  Not comfortable injecting knee, she will call ortho next week. Her updated medication list for this problem includes:    Allopurinol 100 Mg Tabs (Allopurinol) .Marland Kitchen... 1.5 daily    Colcrys 0.6 Mg Tabs (Colchicine) .Marland Kitchen... Take one tablet daily as directed  Orders: [facility]- Est  Level 4 (45409)  Problem # 2:  UNSPECIFIED SINUSITIS (ICD-473.9)  Likely triggered by pollen, recommended OTC cetirizine 10 mg at bedtime. Her updated medication list for this problem includes:    Flonase 50 Mcg/act Susp (Fluticasone propionate) .Marland Kitchen..Marland Kitchen Two squirts in each nostril daily  Orders: [facility]- Est  Level 4 (81191)  Problem # 3:  VENOUS INSUFFICIENCY (ICD-459.81)  Likely cause of LE edema with recirculation when lying down.  Elastic stocking during the day.  Topical steroids to rash.  Orders: [facility]- Est  Level 4 (99214)  Problem # 4:  CHRONIC KIDNEY DISEASE STAGE III (MODERATE) (ICD-585.3)  Stopped furosemide 3 weeks ago, really wants to take again, she feel she is having a hard time breathing, yet chest is clear.  May take 40 mg tomorrow and then 20 mg every other day.  Return in one week for lab check.  Orders: [facility]- Est  Level 4 (54098)  Complete Medication List: 1)  Plavix 75 Mg Tabs (Clopidogrel bisulfate) .... Take one tablet daily 2)  Albuterol 90 Mcg/act Aers (Albuterol) .... Inhale 2 puff using inhaler four times a day 3)  Buspirone Hcl 15 Mg Tabs (Buspirone hcl) .... Take 1 tablet by mouth twice a day 4)  Ecotrin Low Strength 81 Mg Tbec (Aspirin) .... Take 1 tablet by mouth once a day 5)  Florastor (saccharomyces Boulardii) Caps  .... Take 1 packet by mouth twice a day 6)  Metoprolol Succinate 50 Mg Tb24 (Metoprolol succinate) .... Take 1 tablet by mouth once a day 7)  Simvastatin 40 Mg Tabs (Simvastatin) .... One daily 8)  Lasix 20 Mg Tabs (Furosemide) .... Take 2  tablets by  mouth each a.m. 9)  Vitamin B-6 100 Mg Tabs (Pyridoxine hcl) .... Take one tablet daily 10)  Allopurinol 100 Mg Tabs (Allopurinol) .... 1.5 daily 11)  Omeprazole 20 Mg Cpdr (Omeprazole) .... Take one tab two times a day 12)  Tramadol-acetaminophen 37.5-325 Mg Tabs (Tramadol-acetaminophen) .... Take one tablet every 6 hours as needed pain. 13)  Glyburide 5 Mg Tabs (Glyburide) .... Take 1/4  tab one times a day 14)  Nifedipine 60 Mg Xr24h-tab (Nifedipine) .... Take one tablet daily 15)  Diabetic Shoes  .... Thick nails, callouses 16)  Januvia 100 Mg Tabs (Sitagliptin phosphate) .... Take 1/4 tab daily 17)  Triamcinolone Acetonide 0.1 % Oint (Triamcinolone acetonide) .... Use 1-2 times a day for leg rash 18)  Mupirocin 2 % Oint (Mupirocin) .... Apply to sores in nose, 22 gm 19)  Calcitriol 0.5 Mcg Caps (Calcitriol) .... Take one tab 3 times weekly 20)  Colcrys 0.6 Mg Tabs (Colchicine) .... Take one tablet daily as directed 21)  Proair Hfa 108 (90 Base) Mcg/act Aers (Albuterol sulfate) .... 2 puffs qid as needed 22)  Flonase 50 Mcg/act Susp (Fluticasone propionate) .... Two squirts in each nostril daily 23)  Polyethylene Glycol 3350 Powd (Polyethylene glycol 3350) .Marland KitchenMarland Kitchen. 17 gm in 4 oz of liquid daily, qs for one month 24)  Loperamide Hcl 2 Mg Caps (Loperamide hcl) .... Take 3 then one after each loose bowel movement 25)  Cetirizine Hcl 10 Mg Tabs (Cetirizine hcl) .... Otc one qhs 26)  Prednisone 20 Mg Tabs (Prednisone) .... One daily for 5 days  Patient Instructions: 1)  generic Zyrtec (certirizine) every night during pollen season 2)  Prednisone 20 mg for 5 days 3)  2 Lasix once, then one every other day 4)  Please schedule a follow-up appointment in 1 weeks-[facility] Prescriptions: PREDNISONE 20 MG TABS (PREDNISONE) one daily for 5 days  #5 x 0   Entered and Authorized by:   Luretha Murphy NP   Signed by:   Luretha Murphy NP on 03/04/2010   Method used:   Electronically to        [id number] (retail)       [address]       [place], [place]  [id number]       Ph: [phone]       Fax: [phone]   RxID:   [id number] PREDNISONE 20 MG TABS (PREDNISONE) one daily for 5 days  #5 x 0   Entered and Authorized by:   Luretha Murphy NP   Signed by:   Luretha Murphy NP on 03/04/2010   Method used:   Electronically to        [facility]  [address] [address] ([facility]       [address]       [place], [place]  [address]       Ph: [phone]       Fax: [phone]   RxID:   [id number]

## 2011-01-05 NOTE — Miscellaneous (Signed)
Summary: asthma classification   Clinical Lists Changes  Problems: Changed problem from ASTHMA (ICD-493.90) to ASTHMA, INTERMITTENT (ICD-493.90) 

## 2011-01-05 NOTE — Consult Note (Signed)
Summary: [facility]  [facility]   Imported By: Bradly Bienenstock 02/22/2010 15:48:43  _____________________________________________________________________  External Attachment:    Type:   Image     Comment:   External Document  Appended Document: [facility]    Clinical Lists Changes  Observations: Added new observation of URIC ACID: 6.8 mg/dL (29/56/2130 86:57) Added new observation of CALCIUM: 10.1 mg/dL (84/69/6295 28:41) Added new observation of ALBUMIN: 4.2 g/dL (32/44/0102 72:53) Added new observation of CREATININE: 2.62 mg/dL (66/44/0347 42:59) Added new observation of BUN: 62 mg/dL (56/38/7564 33:29) Added new observation of K SERUM: 4.3 meq/L (01/24/2010 17:53)

## 2011-01-05 NOTE — Assessment & Plan Note (Signed)
Summary: see notes/[place]   Vital Signs:  Patient profile:   77 year old female Menstrual status:  hysterectomy Weight:      139.7 pounds Temp:     97.9 degrees F oral Pulse rate:   78 / minute BP sitting:   118 / 62  (left arm) Cuff size:   regular  Vitals Entered By: Arlyss Repress CMA, (December 24, 2009 11:12 AM) CC: head ache, sinus pressure and congestion x 3 weeks. eye pain x 2 days. saw ophthalmologist yesterday. eye exam wnl. no infection, fluid or pressure elevation. good exam. may have sinus infection? check sore on buttom. very painful over the last 2 days. Is Patient Diabetic? Yes Pain Assessment Patient in pain? yes     Location: head Intensity: 9 Onset of pain  x 3 weeks.   Primary Care Provider:  Zachery Dauer MD  CC:  head ache, sinus pressure and congestion x 3 weeks. eye pain x 2 days. saw ophthalmologist yesterday. eye exam wnl. no infection, and fluid or pressure elevation. good exam. may have sinus infection? check sore on buttom. very painful over the last 2 days.Marland Kitchen  History of Present Illness: Very upset today.  Feels bad.  Severe HAs went to eye doctor and he told her it was likely her sinuses, that her eyes are fine.  Getting a sore in her left nostril today, has had in past.  She denies picking.  Viral URI 2 weeks ago, getting worse.  Describes congestion, cough, poor drainage.    Sore on buttocks, has had previous skin cancer in the area.  Present for a few days.  Family getting sued about a dog that was killed the family dog.  She is besider herself.  Habits & Providers  Alcohol-Tobacco-Diet     Tobacco Status: never  Current Medications (verified): 1)  Plavix 75 Mg Tabs (Clopidogrel Bisulfate) .... Take One Tablet Daily 2)  Albuterol 90 Mcg/act Aers (Albuterol) .... Inhale 2 Puff Using Inhaler Four Times A Day 3)  Buspirone Hcl 15 Mg Tabs (Buspirone Hcl) .... Take 1 Tablet By Mouth Twice A Day 4)  Ecotrin Low Strength 81 Mg Tbec (Aspirin) ....  Take 1 Tablet By Mouth Once A Day 5)  Florastor  (Saccharomyces Boulardii) Caps .... Take 1 Packet By Mouth Twice A Day 6)  Metoprolol Succinate 50 Mg Tb24 (Metoprolol Succinate) .... Take 1 Tablet By Mouth Once A Day 7)  Simvastatin 40 Mg  Tabs (Simvastatin) .... One Daily 8)  Lasix 20 Mg Tabs (Furosemide) .... Take 2  Tablets By Mouth Each A.m. 9)  Vitamin B-6 100 Mg Tabs (Pyridoxine Hcl) .... Take One Tablet Daily 10)  Allopurinol 100 Mg  Tabs (Allopurinol) .... 1.5 Daily 11)  Omeprazole 20 Mg Cpdr (Omeprazole) .... Take One Tab Two Times A Day 12)  Tramadol-Acetaminophen 37.5-325 Mg Tabs (Tramadol-Acetaminophen) .... Take One Tablet Every 6 Hours As Needed Pain. 13)  Glyburide 5 Mg Tabs (Glyburide) .... Take 1/2 Tab Two Times A Day 14)  Nifedipine 60 Mg Xr24h-Tab (Nifedipine) .... Take One Tablet Daily 15)  Zostavax 62130 Unt/0.86ml Solr (Zoster Vaccine Live) .... Dispense and Inject X 1 16)  Diabetic Shoes .... Thick Nails, Callouses 17)  Januvia 100 Mg Tabs (Sitagliptin Phosphate) .... Take 1/4 Tab Daily 18)  Triamcinolone Acetonide 0.1 % Oint (Triamcinolone Acetonide) .... Use 1-2 Times A Day For Leg Rash 19)  Mupirocin 2 % Oint (Mupirocin) .... Apply To Sores in Nose, 22 Gm 20)  Calcitriol 0.5 Mcg  Caps (Calcitriol) .... Take One Tab 3 Times Weekly 21)  Colcrys 0.6 Mg Tabs (Colchicine) .... Take One Tablet Daily As Directed 22)  Doxycycline Hyclate 100 Mg Tabs (Doxycycline Hyclate) .... One Tab Two Times A Day For 7 Days  Allergies (verified): 1)  ! Novocain 2)  * Clonidine 3)  Codeine 4)  Benadryl 5)  * Tavist 6)  Cipro 7)  Neurontin 8)  Septra Ds (Sulfamethoxazole-Trimethoprim)  Physical Exam  General:  Looked tired, voice loud, hourse, and non stop. Ears:  TMs dull Nose:  clear rhinitis, small red sore on left nare at septum Mouth:  + milky post nasal drainage Lungs:  normal respiratory effort and normal breath sounds.   Heart:  normal rate and regular rhythm.      Impression & Recommendations:  Problem # 1:  INFECTION, SKIN AND SOFT TISSUE (ICD-686.9)  Treat infection in nares with doxycycline, will also cover if anyting is going on in sinuses.  Sore on buttocks very superficial will need to watch.  Antibiotic ointment to nose and buttocks.  Orders: [facility]- Est Level  3 ([address])  Problem # 2:  ANXIETY (ICD-300.00)  Out off control today, had the provider anxious after encounter, tried to calm down and encouraaged her not to foucs on what may not happen. Incresae buspirone to three times a day until things calm down at home. Her updated medication list for this problem includes:    Buspirone Hcl 15 Mg Tabs (Buspirone hcl) .Marland Kitchen... Take 1 tablet by mouth twice a day  Orders: [facility]- Est Level  3 (60454)  Complete Medication List: 1)  Plavix 75 Mg Tabs (Clopidogrel bisulfate) .... Take one tablet daily 2)  Albuterol 90 Mcg/act Aers (Albuterol) .... Inhale 2 puff using inhaler four times a day 3)  Buspirone Hcl 15 Mg Tabs (Buspirone hcl) .... Take 1 tablet by mouth twice a day 4)  Ecotrin Low Strength 81 Mg Tbec (Aspirin) .... Take 1 tablet by mouth once a day 5)  Florastor (saccharomyces Boulardii) Caps  .... Take 1 packet by mouth twice a day 6)  Metoprolol Succinate 50 Mg Tb24 (Metoprolol succinate) .... Take 1 tablet by mouth once a day 7)  Simvastatin 40 Mg Tabs (Simvastatin) .... One daily 8)  Lasix 20 Mg Tabs (Furosemide) .... Take 2  tablets by mouth each a.m. 9)  Vitamin B-6 100 Mg Tabs (Pyridoxine hcl) .... Take one tablet daily 10)  Allopurinol 100 Mg Tabs (Allopurinol) .... 1.5 daily 11)  Omeprazole 20 Mg Cpdr (Omeprazole) .... Take one tab two times a day 12)  Tramadol-acetaminophen 37.5-325 Mg Tabs (Tramadol-acetaminophen) .... Take one tablet every 6 hours as needed pain. 13)  Glyburide 5 Mg Tabs (Glyburide) .... Take 1/2 tab two times a day 14)  Nifedipine 60 Mg Xr24h-tab (Nifedipine) .... Take one tablet daily 15)  Zostavax 09811  Unt/0.51ml Solr (Zoster vaccine live) .... Dispense and inject x 1 16)  Diabetic Shoes  .... Thick nails, callouses 17)  Januvia 100 Mg Tabs (Sitagliptin phosphate) .... Take 1/4 tab daily 18)  Triamcinolone Acetonide 0.1 % Oint (Triamcinolone acetonide) .... Use 1-2 times a day for leg rash 19)  Mupirocin 2 % Oint (Mupirocin) .... Apply to sores in nose, 22 gm 20)  Calcitriol 0.5 Mcg Caps (Calcitriol) .... Take one tab 3 times weekly 21)  Colcrys 0.6 Mg Tabs (Colchicine) .... Take one tablet daily as directed 22)  Doxycycline Hyclate 100 Mg Tabs (Doxycycline hyclate) .... One tab two times a day for  7 days  Patient Instructions: 1)  Take antibiotic it will help your sore in your nose and your sinuses. 2)  Antibiotic ointment to sore on buttocks 3)  You need to take one day at a time, try to not get upset. 4)  Followup one month Prescriptions: MUPIROCIN 2 % OINT (MUPIROCIN) apply to sores in nose, 22 gm  #1 x 3   Entered and Authorized by:   Luretha Murphy NP   Signed by:   Luretha Murphy NP on 12/24/2009   Method used:   Electronically to        [facility] (retail)       [address]       [place], [place]  [address]       Ph: [phone]       Fax: [phone]   RxID:   [id number] DOXYCYCLINE HYCLATE 100 MG TABS (DOXYCYCLINE HYCLATE) one tab two times a day for 7 days  #14 x 0   Entered and Authorized by:   Luretha Murphy NP   Signed by:   Luretha Murphy NP on 12/24/2009   Method used:   Electronically to        [facility] (retail)       [address]       [place], [place]  [address]       Ph: [phone]       Fax: [phone]   RxID:   [id number]

## 2011-01-05 NOTE — Progress Notes (Signed)
Summary: Triage  Phone Note Call from Patient Call back at Home Phone [phone]   Reason for Call: Talk to Nurse Summary of Call: pt requesting to speak with RN sts she is broke out and her bottom is hurting Initial call taken by: Knox Royalty,  December 27, 2009 8:34 AM  Follow-up for Phone Call        states she has chills but no fever. had 10 episodes of diarrhea yesterday. last dose antibiotic was yesterday am. has pain in "bottom" states her relative looked & it looks like shingles-whelps with pus.  has no way to get here this am. states she is such pain she cannot sit. to S. Tatsuo Musial for advise. uses [facility] on [address]. Follow-up by: Golden Circle RN,  December 27, 2009 8:38 AM  Additional Follow-up for Phone Call Additional follow up Details #1::        Called, diarrhea for 2 days over 10 per day, perimeum very irritated and painful.  Stop antibotic, clean self with shower, use vasoline to perineum. Additional Follow-up by: Luretha Murphy NP,  December 27, 2009 9:14 AM    Prescriptions: ALBUTEROL 90 MCG/ACT AERS (ALBUTEROL) Inhale 2 puff using inhaler four times a day  #2 x 12   Entered by:   Golden Circle RN   Authorized by:   Zachery Dauer MD   Signed by:   Golden Circle RN on 12/27/2009   Method used:   Electronically to        [facility] (retail)       [address]       [place], [place]  [id number]       Ph: [phone]       Fax: [phone]   RxID:   [id number]

## 2011-01-05 NOTE — Assessment & Plan Note (Signed)
Summary: f/u/kh   Vital Signs:  Patient profile:   77 year old female Menstrual status:  hysterectomy Height:      59.75 inches Weight:      147 pounds BMI:     29.05 Temp:     98.2 degrees F oral Pulse rate:   66 / minute BP sitting:   125 / 57  (right arm) Cuff size:   regular  Vitals Entered By: Jimmy Footman, CMA (June 21, 2010 1:31 PM) CC: kidney f/u Is Patient Diabetic? Yes Did you bring your meter with you today? Yes Pain Assessment Patient in pain? no        Primary Care Provider:  Zachery Dauer MD  CC:  kidney f/u.  History of Present Illness: has had weak spells, but capillary blood glucose was 150. The highest on her machine was 206. Others in low 100's  No chest pain, needs isosorbide refill.   Saw Dr Caryn Section who said she appears stable. Continues off furosemide.   Saw Dr Vanetta Shawl yesterday, no interventions to aid her vision.   Breast biopsy in 2 days  Continues pain in right first MP joint.       Habits & Providers  Alcohol-Tobacco-Diet     Tobacco Status: never  Current Medications (verified): 1)  Plavix 75 Mg Tabs (Clopidogrel Bisulfate) .... Take One Tablet Daily 2)  Albuterol 90 Mcg/act Aers (Albuterol) .... Inhale 2 Puff Using Inhaler Four Times A Day 3)  Buspirone Hcl 15 Mg Tabs (Buspirone Hcl) .... Take 1 Tablet By Mouth Twice A Day 4)  Ecotrin Low Strength 81 Mg Tbec (Aspirin) .... Take 1 Tablet By Mouth Once A Day 5)  Florastor  (Saccharomyces Boulardii) Caps .... Take 1 Packet By Mouth Twice A Day 6)  Metoprolol Succinate 50 Mg Tb24 (Metoprolol Succinate) .... Take 1 Tablet By Mouth Once A Day 7)  Simvastatin 40 Mg  Tabs (Simvastatin) .... One Daily 8)  Vitamin B-6 100 Mg Tabs (Pyridoxine Hcl) .... Take One Tablet Daily 9)  Allopurinol 100 Mg  Tabs (Allopurinol) .Marland Kitchen.. 1 Daily 10)  Omeprazole 20 Mg Cpdr (Omeprazole) .... Take One Tab Two Times A Day 11)  Tramadol-Acetaminophen 37.5-325 Mg Tabs (Tramadol-Acetaminophen) .... Take One Tablet  Every 6 Hours As Needed Pain. 12)  Nifedipine 60 Mg Xr24h-Tab (Nifedipine) .... Take One Tablet Daily 13)  Diabetic Shoes .... Thick Nails, Callouses 14)  Triamcinolone Acetonide 0.1 % Oint (Triamcinolone Acetonide) .... Use 1-2 Times A Day For Leg Rash 15)  Calcitriol 0.5 Mcg Caps (Calcitriol) .... Take One Tab 3 Times Weekly 16)  Colcrys 0.6 Mg Tabs (Colchicine) .... Take One Tablet Daily As Needed For Gout 17)  Proair Hfa 108 (90 Base) Mcg/act Aers (Albuterol Sulfate) .... 2 Puffs Qid As Needed 18)  Flonase 50 Mcg/act Susp (Fluticasone Propionate) .... Two Squirts in Each Nostril Daily 19)  Polyethylene Glycol 3350  Powd (Polyethylene Glycol 3350) .Marland KitchenMarland KitchenMarland Kitchen 17 Gm in 4 Oz of Liquid Daily, Qs For One Month 20)  Loperamide Hcl 2 Mg Caps (Loperamide Hcl) .... Take 3 Then One After Each Loose Bowel Movement 21)  Cetirizine Hcl 10 Mg Tabs (Cetirizine Hcl) .... Otc One Qhs 22)  Isosorbide Mononitrate Cr 30 Mg Xr24h-Tab (Isosorbide Mononitrate) .... One Q Morning 23)  Nitrostat 0.4 Mg Subl (Nitroglycerin) .... One Under Tongue As Needed For Chest Pain, May Repeat in 5 Minutes Twice But If Still Having Problems Access Emergency Care, 1 Bottle 24)  Bayer Contour Test  Strp (Glucose Blood) .Marland KitchenMarland KitchenMarland Kitchen  Test Fasting and When Symptoms of Low Blood Sugar  Allergies (verified): 1)  ! Novocain 2)  ! Doxycycline Hyclate (Doxycycline Hyclate) 3)  * Clonidine 4)  Codeine 5)  Benadryl 6)  * Tavist 7)  Cipro 8)  Neurontin 9)  Septra Ds (Sulfamethoxazole-Trimethoprim)  Physical Exam  General:  More well appearing Lungs:  normal respiratory effort, normal breath sounds, and no crackles.   Heart:  normal rate and regular rhythm.   Msk:  Tender left first MP joint, discomfort when stressing Extremities:  tr edema at ankles   Impression & Recommendations:  Problem # 1:  FATIGUE (ICD-780.79) Assessment Unchanged  Problem # 2:  HYPERTENSION (ICD-401.9) Assessment: Unchanged  The following medications were  removed from the medication list:    Lasix 20 Mg Tabs (Furosemide) .Marland Kitchen... 1-2 as needed for no void Her updated medication list for this problem includes:    Metoprolol Succinate 50 Mg Tb24 (Metoprolol succinate) .Marland Kitchen... Take 1 tablet by mouth once a day    Nifedipine 60 Mg Xr24h-tab (Nifedipine) .Marland Kitchen... Take one tablet daily  Orders: [facility]- Est  Level 4 ([address])  Problem # 3:  DIABETES MELLITUS, II, COMPLICATIONS (ICD-250.92) Controlled off medication Her updated medication list for this problem includes:    Ecotrin Low Strength 81 Mg Tbec (Aspirin) .Marland Kitchen... Take 1 tablet by mouth once a day  Orders: [facility]- Est  Level 4 (99214)  Problem # 4:  CHRONIC KIDNEY DISEASE STAGE III (MODERATE) (ICD-585.3) Stable Orders: [facility]- Est  Level 4 (99214)  Problem # 5:  PAIN IN JOINT, HAND (ICD-719.44) continues, but no signs of inflammation. degenerative joint disease   Problem # 6:  ASTHMA (ICD-493.90) Minimal recent symptoms  Her updated medication list for this problem includes:    Albuterol 90 Mcg/act Aers (Albuterol) ..... Inhale 2 puff using inhaler four times a day    Proair Hfa 108 (90 Base) Mcg/act Aers (Albuterol sulfate) .Marland Kitchen... 2 puffs qid as needed  Complete Medication List: 1)  Plavix 75 Mg Tabs (Clopidogrel bisulfate) .... Take one tablet daily 2)  Albuterol 90 Mcg/act Aers (Albuterol) .... Inhale 2 puff using inhaler four times a day 3)  Buspirone Hcl 15 Mg Tabs (Buspirone hcl) .... Take 1 tablet by mouth twice a day 4)  Ecotrin Low Strength 81 Mg Tbec (Aspirin) .... Take 1 tablet by mouth once a day 5)  Florastor (saccharomyces Boulardii) Caps  .... Take 1 packet by mouth twice a day 6)  Metoprolol Succinate 50 Mg Tb24 (Metoprolol succinate) .... Take 1 tablet by mouth once a day 7)  Simvastatin 40 Mg Tabs (Simvastatin) .... One daily 8)  Vitamin B-6 100 Mg Tabs (Pyridoxine hcl) .... Take one tablet daily 9)  Allopurinol 100 Mg Tabs (Allopurinol) .Marland Kitchen.. 1 daily 10)  Omeprazole 20 Mg Cpdr  (Omeprazole) .... Take one tab two times a day 11)  Tramadol-acetaminophen 37.5-325 Mg Tabs (Tramadol-acetaminophen) .... Take one tablet every 6 hours as needed pain. 12)  Nifedipine 60 Mg Xr24h-tab (Nifedipine) .... Take one tablet daily 13)  Diabetic Shoes  .... Thick nails, callouses 14)  Triamcinolone Acetonide 0.1 % Oint (Triamcinolone acetonide) .... Use 1-2 times a day for leg rash 15)  Calcitriol 0.5 Mcg Caps (Calcitriol) .... Take one tab 3 times weekly 16)  Colcrys 0.6 Mg Tabs (Colchicine) .... Take one tablet daily as needed for gout 17)  Proair Hfa 108 (90 Base) Mcg/act Aers (Albuterol sulfate) .... 2 puffs qid as needed 18)  Flonase 50 Mcg/act Susp (Fluticasone propionate) .Marland KitchenMarland KitchenMarland Kitchen  Two squirts in each nostril daily 19)  Polyethylene Glycol 3350 Powd (Polyethylene glycol 3350) .Marland KitchenMarland Kitchen. 17 gm in 4 oz of liquid daily, qs for one month 20)  Loperamide Hcl 2 Mg Caps (Loperamide hcl) .... Take 3 then one after each loose bowel movement 21)  Cetirizine Hcl 10 Mg Tabs (Cetirizine hcl) .... Otc one qhs 22)  Isosorbide Mononitrate Cr 30 Mg Xr24h-tab (Isosorbide mononitrate) .... One q morning 23)  Nitrostat 0.4 Mg Subl (Nitroglycerin) .... One under tongue as needed for chest pain, may repeat in 5 minutes twice but if still having problems access emergency care, 1 bottle 24)  Bayer Contour Test Strp (Glucose blood) .... Test fasting and when symptoms of low blood sugar  Patient Instructions: 1)  It is important that you exercise reguarly at least 10 minutes 5 times a week exercycle. Increase it by 1 minute daily each week.  2)  If you develop chest pain, have severe difficulty breathing, or feel very tired, stop exercising immediately and seek medical attention.  3)  Please schedule a follow-up appointment in 1 month.  Prescriptions: PROAIR HFA 108 (90 BASE) MCG/ACT AERS (ALBUTEROL SULFATE) 2 puffs qid as needed  #1 x 6   Entered and Authorized by:   Zachery Dauer MD   Signed by:   Zachery Dauer MD on  06/21/2010   Method used:   Electronically to        [facility] (retail)       [address]       [place], [place]  [id number]       Ph: [phone]       Fax: [phone]   RxID:   [id number] ISOSORBIDE MONONITRATE CR 30 MG XR24H-TAB (ISOSORBIDE MONONITRATE) one q morning  #30 x 11   Entered and Authorized by:   Zachery Dauer MD   Signed by:   Zachery Dauer MD on 06/21/2010   Method used:   Electronically to        [facility] (retail)       [address]       [place], [place]  [id number]       Ph: [phone]       Fax: [phone]   RxID:   [id number]     Prevention & Chronic Care Immunizations   Influenza vaccine: Fluvax MCR  (09/09/2009)   Influenza vaccine due: 08/04/2010    Tetanus booster: 01/04/2005: Done.   Tetanus booster due: 01/04/2015    Pneumococcal vaccine: Done.  (08/05/1999)   Pneumococcal vaccine deferral: Not indicated  (10/19/2009)   Pneumococcal vaccine due: None    H. zoster vaccine: 10/04/2009: Zostavax  Colorectal Screening   Hemoccult: Done.  (02/01/2002)   Hemoccult due: Not Indicated    Colonoscopy: Done.  (02/01/2002)   Colonoscopy due: 02/02/2012  Other Screening   Pap smear: Done.  (01/04/2006)   Pap smear due: Not Indicated    Mammogram: Assessment: BIRADS 4. Location: [facility].    (05/12/2010)   Mammogram action/deferral: Recheck in 6 mos. to consider biopsy for low risk microcalcifications  (05/12/2010)   Mammogram due: 09/24/2009    DXA bone density scan: Done.  (10/04/2000)   DXA bone density action/deferral: Ordered  (07/29/2009)   DXA scan due: None    Smoking status: never  (06/21/2010)  Diabetes Mellitus   HgbA1C: 5.9  (05/31/2010)   Hemoglobin A1C due: Not Indicated    Eye exam: status post laser  (  06/14/2010)   Eye exam due: 07/09/2009    Foot exam: yes  (07/29/2009)   High risk foot: Not documented   Foot care  education: Not documented   Foot exam due: 07/14/2009    Urine microalbumin/creatinine ratio: Not documented   Urine microalbumin/cr due: Not Indicated    Diabetes flowsheet reviewed?: Yes   Progress toward A1C goal: At goal  Lipids   Total Cholesterol: 142  (03/10/2010)   LDL: 72  (03/10/2010)   LDL Direct: 88  (07/28/2008)   HDL: 35  (03/10/2010)   Triglycerides: 177  (03/10/2010)    SGOT (AST): 40  (02/07/2010)   SGPT (ALT): 22  (02/07/2010)   Alkaline phosphatase: 82  (02/07/2010)   Total bilirubin: 0.4  (02/07/2010)    Lipid flowsheet reviewed?: Yes   Progress toward LDL goal: At goal  Hypertension   Last Blood Pressure: 125 / 57  (06/21/2010)   Serum creatinine: 2.67  (05/12/2010)   Serum potassium 5.1  (05/12/2010)    Hypertension flowsheet reviewed?: Yes   Progress toward BP goal: At goal  Self-Management Support :   Personal Goals (by the next clinic visit) :     Personal A1C goal: 7  (07/29/2009)     Personal blood pressure goal: 130/80  (07/29/2009)     Personal LDL goal: 100  (07/29/2009)    Diabetes self-management support: Education handout  (07/29/2009)    Hypertension self-management support: Education handout  (07/29/2009)    Lipid self-management support: Not documented     Diabetic Eye Exam  Procedure date:  06/14/2010  Findings:      status post laser  Comments:      Dr Vanetta Shawl   Diabetic Eye Exam  Procedure date:  06/14/2010  Findings:      status post laser  Comments:      Dr Vanetta Shawl

## 2011-01-05 NOTE — Consult Note (Signed)
[facility]   Imported By: De Nurse 08/16/2010 09:44:50  _____________________________________________________________________  External Attachment:    Type:   Image     Comment:   External Document

## 2011-01-05 NOTE — Assessment & Plan Note (Signed)
Summary: states cannot void/[place]/hale   Vital Signs:  Patient profile:   77 year old female Menstrual status:  hysterectomy Height:      59.75 inches Weight:      141 pounds BMI:     27.87 Temp:     98.3 degrees F oral Pulse rate:   65 / minute BP sitting:   106 / 83  (left arm) Cuff size:   regular  Vitals Entered By: Tessie Fass CMA (Apr 12, 2010 1:42 PM) CC: urgency, unable to void x 4 days Is Patient Diabetic? Yes Pain Assessment Patient in pain? no        Primary Care Provider:  Zachery Dauer MD  CC:  urgency and unable to void x 4 days.  History of Present Illness: Feels like she cannot void, had been voiding more at night but last night did not void much.  Feels like she needs to squeeze it out.  Denies dysuria or pain.  Has been having some increased back pain.  Drinks 4 glasses of water daily, but does not keep track of exact amount.  Had a bad meal on Mother's day that made her feel sick and she needed to take a nitroglycerine.  She is feeling better today.  Habits & Providers  Alcohol-Tobacco-Diet     Tobacco Status: never  Current Medications (verified): 1)  Plavix 75 Mg Tabs (Clopidogrel Bisulfate) .... Take One Tablet Daily 2)  Albuterol 90 Mcg/act Aers (Albuterol) .... Inhale 2 Puff Using Inhaler Four Times A Day 3)  Buspirone Hcl 15 Mg Tabs (Buspirone Hcl) .... Take 1 Tablet By Mouth Twice A Day 4)  Ecotrin Low Strength 81 Mg Tbec (Aspirin) .... Take 1 Tablet By Mouth Once A Day 5)  Florastor  (Saccharomyces Boulardii) Caps .... Take 1 Packet By Mouth Twice A Day 6)  Metoprolol Succinate 50 Mg Tb24 (Metoprolol Succinate) .... Take 1 Tablet By Mouth Once A Day 7)  Simvastatin 40 Mg  Tabs (Simvastatin) .... One Daily 8)  Lasix 20 Mg Tabs (Furosemide) .... Take 2  Tablets By Mouth Each A.m. 9)  Vitamin B-6 100 Mg Tabs (Pyridoxine Hcl) .... Take One Tablet Daily 10)  Allopurinol 100 Mg  Tabs (Allopurinol) .... 1.5 Daily 11)  Omeprazole 20 Mg Cpdr  (Omeprazole) .... Take One Tab Two Times A Day 12)  Tramadol-Acetaminophen 37.5-325 Mg Tabs (Tramadol-Acetaminophen) .... Take One Tablet Every 6 Hours As Needed Pain. 13)  Glyburide 5 Mg Tabs (Glyburide) .... Take 1/4  Tab One Times A Day 14)  Nifedipine 60 Mg Xr24h-Tab (Nifedipine) .... Take One Tablet Daily 15)  Diabetic Shoes .... Thick Nails, Callouses 16)  Januvia 100 Mg Tabs (Sitagliptin Phosphate) .... Take 1/4 Tab Daily 17)  Triamcinolone Acetonide 0.1 % Oint (Triamcinolone Acetonide) .... Use 1-2 Times A Day For Leg Rash 18)  Mupirocin 2 % Oint (Mupirocin) .... Apply To Sores in Nose, 22 Gm 19)  Calcitriol 0.5 Mcg Caps (Calcitriol) .... Take One Tab 3 Times Weekly 20)  Colcrys 0.6 Mg Tabs (Colchicine) .... Take One Tablet Daily As Directed 21)  Proair Hfa 108 (90 Base) Mcg/act Aers (Albuterol Sulfate) .... 2 Puffs Qid As Needed 22)  Flonase 50 Mcg/act Susp (Fluticasone Propionate) .... Two Squirts in Each Nostril Daily 23)  Polyethylene Glycol 3350  Powd (Polyethylene Glycol 3350) .Marland KitchenMarland KitchenMarland Kitchen 17 Gm in 4 Oz of Liquid Daily, Qs For One Month 24)  Loperamide Hcl 2 Mg Caps (Loperamide Hcl) .... Take 3 Then One After Each Loose Bowel  Movement 25)  Cetirizine Hcl 10 Mg Tabs (Cetirizine Hcl) .... Otc One Qhs 26)  Isosorbide Mononitrate Cr 30 Mg Xr24h-Tab (Isosorbide Mononitrate) .... One Q Morning 27)  Nitrostat 0.4 Mg Subl (Nitroglycerin) .... One Under Tongue As Needed For Chest Pain, May Repeat in 5 Minutes Twice But If Still Having Problems Access [facility], 1 Bottle  Allergies (verified): 1)  ! Novocain 2)  ! Doxycycline Hyclate (Doxycycline Hyclate) 3)  * Clonidine 4)  Codeine 5)  Benadryl 6)  * Tavist 7)  Cipro 8)  Neurontin 9)  Septra Ds (Sulfamethoxazole-Trimethoprim)  Physical Exam  General:  Well appearing Mouth:  moist membranes Lungs:  normal respiratory effort and no crackles.   Heart:  normal rate and regular rhythm.   Abdomen:  + bs, soft, no masses, bladder non  palpable; right CVA tenderness   Impression & Recommendations:  Problem # 1:  OLIGURIA (ICD-788.5) Self reported, urine specific gravity was normal, spilling protein.  She is to keep better track of her intake.  May take 2 furosemide today and one tomorrow then back to every other day.  Check creatitine today, had bumped up after a drop when dieuretic was cut back.  Weight stable, no edema on exam. Orders: Urinalysis-[facility] (00000) Urine Culture-[facility] ([id number]) Basic Met-[facility] ([id number]) [facility]- Est Level  3 (23557)  Problem # 2:  SACROILIITIS, LEFT (ICD-720.2)  Has been going to the chiopractor regularly lately.  Orders: [facility]- Est Level  3 (32202)  Problem # 3:  CHRONIC KIDNEY DISEASE STAGE III (MODERATE) (ICD-585.3) recheck labs Orders: Basic Met-[facility] ([id number]) [facility]- Est Level  3 (28315)  Problem # 4:  HYPERTENSION (ICD-401.9) BP on the low side today. Her updated medication list for this problem includes:    Metoprolol Succinate 50 Mg Tb24 (Metoprolol succinate) .Marland Kitchen... Take 1 tablet by mouth once a day    Lasix 20 Mg Tabs (Furosemide) .Marland Kitchen... Take 2  tablets by mouth each a.m.    Nifedipine 60 Mg Xr24h-tab (Nifedipine) .Marland Kitchen... Take one tablet daily  Orders: [facility]- Est Level  3 ([id number])  Complete Medication List: 1)  Plavix 75 Mg Tabs (Clopidogrel bisulfate) .... Take one tablet daily 2)  Albuterol 90 Mcg/act Aers (Albuterol) .... Inhale 2 puff using inhaler four times a day 3)  Buspirone Hcl 15 Mg Tabs (Buspirone hcl) .... Take 1 tablet by mouth twice a day 4)  Ecotrin Low Strength 81 Mg Tbec (Aspirin) .... Take 1 tablet by mouth once a day 5)  Florastor (saccharomyces Boulardii) Caps  .... Take 1 packet by mouth twice a day 6)  Metoprolol Succinate 50 Mg Tb24 (Metoprolol succinate) .... Take 1 tablet by mouth once a day 7)  Simvastatin 40 Mg Tabs (Simvastatin) .... One daily 8)  Lasix 20 Mg Tabs (Furosemide) .... Take 2  tablets by mouth each a.m. 9)  Vitamin B-6 100 Mg Tabs  (Pyridoxine hcl) .... Take one tablet daily 10)  Allopurinol 100 Mg Tabs (Allopurinol) .... 1.5 daily 11)  Omeprazole 20 Mg Cpdr (Omeprazole) .... Take one tab two times a day 12)  Tramadol-acetaminophen 37.5-325 Mg Tabs (Tramadol-acetaminophen) .... Take one tablet every 6 hours as needed pain. 13)  Glyburide 5 Mg Tabs (Glyburide) .... Take 1/4  tab one times a day 14)  Nifedipine 60 Mg Xr24h-tab (Nifedipine) .... Take one tablet daily 15)  Diabetic Shoes  .... Thick nails, callouses 16)  Januvia 100 Mg Tabs (Sitagliptin phosphate) .... Take 1/4 tab daily 17)  Triamcinolone Acetonide 0.1 % Oint (Triamcinolone acetonide) .Marland KitchenMarland KitchenMarland Kitchen  Use 1-2 times a day for leg rash 18)  Mupirocin 2 % Oint (Mupirocin) .... Apply to sores in nose, 22 gm 19)  Calcitriol 0.5 Mcg Caps (Calcitriol) .... Take one tab 3 times weekly 20)  Colcrys 0.6 Mg Tabs (Colchicine) .... Take one tablet daily as directed 21)  Proair Hfa 108 (90 Base) Mcg/act Aers (Albuterol sulfate) .... 2 puffs qid as needed 22)  Flonase 50 Mcg/act Susp (Fluticasone propionate) .... Two squirts in each nostril daily 23)  Polyethylene Glycol 3350 Powd (Polyethylene glycol 3350) .Marland KitchenMarland Kitchen. 17 gm in 4 oz of liquid daily, qs for one month 24)  Loperamide Hcl 2 Mg Caps (Loperamide hcl) .... Take 3 then one after each loose bowel movement 25)  Cetirizine Hcl 10 Mg Tabs (Cetirizine hcl) .... Otc one qhs 26)  Isosorbide Mononitrate Cr 30 Mg Xr24h-tab (Isosorbide mononitrate) .... One q morning 27)  Nitrostat 0.4 Mg Subl (Nitroglycerin) .... One under tongue as needed for chest pain, may repeat in 5 minutes twice but if still having problems access emergency care, 1 bottle  Patient Instructions: 1)  Take 2 furosemide tonight, and one in the morning and every other day from there. 2)  Keep track of how much water you drink  3)  Apt June 6 already made  Laboratory Results   Urine Tests  Date/Time Received: Apr 12, 2010 2:13 PM  Date/Time Reported: Apr 12, 2010  2:54 PM   Routine Urinalysis   Color: yellow Appearance: Clear Glucose: negative   (Normal Range: Negative) Bilirubin: negative   (Normal Range: Negative) Ketone: negative   (Normal Range: Negative) Spec. Gravity: 1.015   (Normal Range: 1.003-1.035) Blood: negative   (Normal Range: Negative) pH: 5.5   (Normal Range: 5.0-8.0) Protein: 100   (Normal Range: Negative) Urobilinogen: 0.2   (Normal Range: 0-1) Nitrite: negative   (Normal Range: Negative) Leukocyte Esterace: negative   (Normal Range: Negative)  Urine Microscopic WBC/HPF: 0-2 RBC/HPF: rare Bacteria/HPF: 1+ Mucous/HPF: 1+ Epithelial/HPF: rare Casts/LPF: occ hyaline    Comments: cath urine;   urine cultured ...............test performed by......Marland KitchenBonnie A. [place], MLS (ASCP)cm

## 2011-01-05 NOTE — Assessment & Plan Note (Signed)
Summary: Omeprazole removal  Removed for potential interaction with Plavix

## 2011-01-05 NOTE — Progress Notes (Signed)
Summary: wi request  Phone Note Call from Patient Call back at Home Phone [phone]   Reason for Call: Talk to Nurse Summary of Call: wi request with dr Sheffield Slider, still having episodes of vomitting Initial call taken by: Knox Royalty,  July 28, 2010 9:46 AM  Follow-up for Phone Call        states she is still vomiting & has diarrhea. using immodium. told her Dr. Sheffield Slider will be leaving shortly & offered appt with another md. she refused stating no one else knows her. discussion followed. she agreed to come in to avoid a possible ED trip. placed in red team work in slot at 10:30 Follow-up by: Golden Circle RN,  July 28, 2010 10:04 AM

## 2011-01-05 NOTE — Assessment & Plan Note (Signed)
Summary: fu diarrhea/kh   Vital Signs:  Patient profile:   77 year old female Menstrual status:  hysterectomy Height:      59.75 inches Weight:      145 pounds Pulse rate:   66 / minute BP sitting:   114 / 50  (right arm) Cuff size:   regular  Vitals Entered By: San Morelle, SMA CC: Weak, diarrhea and blood in stool Is Patient Diabetic? Yes Pain Assessment Patient in pain? no        Primary Care Provider:  Zachery Dauer MD  CC:  Weak and diarrhea and blood in stool.  History of Present Illness: Continues diarrhea every couple days. Two days ago was incontinent of stool in the kitchen possibly due to eating a milk product the night before. Had severe frequent diarrhea 6 days ago. Tried Sylk soy milk but that caused belching. Is using loperimide, has to take 3 tabs first dose. May have seen blood in her stool. Dr Amil Amen, cardiology,  had prescribed Mg oxide for cramps, but she hasn't been taking it.   Is taking Colchicine two times a day for swelling and pain in right index DIP joint.   Feels weak and tired. Fasting accuchecks usually in the 140-150 range. Eye Dr visit next week.   Appt with Dr Caryn Section next Friday.    Habits & Providers  Alcohol-Tobacco-Diet     Tobacco Status: never  Allergies: 1)  ! Novocain 2)  ! Doxycycline Hyclate (Doxycycline Hyclate) 3)  * Clonidine 4)  Codeine 5)  Benadryl 6)  * Tavist 7)  Cipro 8)  Neurontin 9)  Septra Ds (Sulfamethoxazole-Trimethoprim)  Physical Exam  General:  Usual appearing Lungs:  normal respiratory effort and normal breath sounds.   Heart:  normal rate and regular rhythm.   Abdomen:  soft and diffusly tender no hepatomegaly, no splenomegaly, and bowel sounds hyperactive.   Psych:  normally interactive, good eye contact, and slightly anxious as usual.     Impression & Recommendations:  Problem # 1:  DIARRHEA, RECURRENT (ICD-787.91)  Chronic problems aggravated by lactose intolerance. Bleeding likely from  hemorroids seen on colonoscopy 2003. Consider GI referral.  Her updated medication list for this problem includes:    Loperamide Hcl 2 Mg Caps (Loperamide hcl) .Marland Kitchen... Take 3 then one after each loose bowel movement  Orders: [facility]- Est  Level 4 (16109)  Problem # 2:  FATIGUE (ICD-780.79)  Chronic. Inadvertently didn't get CBC. Last done in August. Will ask Dr Caryn Section to check it with labs that he will do.   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 Risk of hypoglycemia. Will decrease and possibly stop Glyburide.  Her updated medication list for this problem includes:    Ecotrin Low Strength 81 Mg Tbec (Aspirin) .Marland Kitchen... Take 1 tablet by mouth once a day    Glyburide 5 Mg Tabs (Glyburide) .Marland Kitchen... Take 1/4  tab one times a day    Januvia 100 Mg Tabs (Sitagliptin phosphate) .Marland Kitchen... Take 1/4 tab daily  Orders: A1C-[facility] (09811) [facility]- Est  Level 4 (91478)  Problem # 4:  HYPERTENSION (ICD-401.9) Assessment: Improved  Her updated medication list for this problem includes:    Metoprolol Succinate 50 Mg Tb24 (Metoprolol succinate) .Marland Kitchen... Take 1 tablet by mouth once a day    Lasix 20 Mg Tabs (Furosemide) .Marland Kitchen... Take 2  tablets by mouth each a.m.    Nifedipine 60 Mg Xr24h-tab (Nifedipine) .Marland KitchenMarland KitchenMarland KitchenMarland Kitchen  Take one tablet daily  Orders: [facility]- Est  Level 4 (99214)  Problem # 5:  ACUTE GOUTY ARTHROPATHY (ICD-274.01) Recheck uric acid  Her updated medication list for this problem includes:    Allopurinol 100 Mg Tabs (Allopurinol) .Marland Kitchen... 1.5 daily    Colcrys 0.6 Mg Tabs (Colchicine) .Marland Kitchen... Take one tablet daily as directed  Complete Medication List: 1)  Plavix 75 Mg Tabs (Clopidogrel bisulfate) .... Take one tablet daily 2)  Albuterol 90 Mcg/act Aers (Albuterol) .... Inhale 2 puff using inhaler four times a day 3)  Buspirone Hcl 15 Mg Tabs (Buspirone hcl) .... Take 1 tablet by mouth twice a day 4)  Ecotrin Low Strength 81 Mg Tbec (Aspirin) .... Take 1  tablet by mouth once a day 5)  Florastor (saccharomyces Boulardii) Caps  .... Take 1 packet by mouth twice a day 6)  Metoprolol Succinate 50 Mg Tb24 (Metoprolol succinate) .... Take 1 tablet by mouth once a day 7)  Simvastatin 40 Mg Tabs (Simvastatin) .... One daily 8)  Lasix 20 Mg Tabs (Furosemide) .... Take 2  tablets by mouth each a.m. 9)  Vitamin B-6 100 Mg Tabs (Pyridoxine hcl) .... Take one tablet daily 10)  Allopurinol 100 Mg Tabs (Allopurinol) .... 1.5 daily 11)  Omeprazole 20 Mg Cpdr (Omeprazole) .... Take one tab two times a day 12)  Tramadol-acetaminophen 37.5-325 Mg Tabs (Tramadol-acetaminophen) .... Take one tablet every 6 hours as needed pain. 13)  Glyburide 5 Mg Tabs (Glyburide) .... Take 1/4  tab one times a day 14)  Nifedipine 60 Mg Xr24h-tab (Nifedipine) .... Take one tablet daily 15)  Diabetic Shoes  .... Thick nails, callouses 16)  Januvia 100 Mg Tabs (Sitagliptin phosphate) .... Take 1/4 tab daily 17)  Triamcinolone Acetonide 0.1 % Oint (Triamcinolone acetonide) .... Use 1-2 times a day for leg rash 18)  Mupirocin 2 % Oint (Mupirocin) .... Apply to sores in nose, 22 gm 19)  Calcitriol 0.5 Mcg Caps (Calcitriol) .... Take one tab 3 times weekly 20)  Colcrys 0.6 Mg Tabs (Colchicine) .... Take one tablet daily as directed 21)  Proair Hfa 108 (90 Base) Mcg/act Aers (Albuterol sulfate) .... 2 puffs qid as needed 22)  Flonase 50 Mcg/act Susp (Fluticasone propionate) .... Two squirts in each nostril daily 23)  Polyethylene Glycol 3350 Powd (Polyethylene glycol 3350) .Marland KitchenMarland Kitchen. 17 gm in 4 oz of liquid daily, qs for one month 24)  Loperamide Hcl 2 Mg Caps (Loperamide hcl) .... Take 3 then one after each loose bowel movement  Patient Instructions: 1)  Please schedule a follow-up appointment in 1 month.  2)  A1c is the average of your blood pressure over past 3 months. 3)  Want it under 7.0. Yours is too low at 5.6, so decrease the Glyburide to 1/4 of a tablet daily. Call Dr Sheffield Slider if it  doesn't break well.   Laboratory Results   Blood Tests   Date/Time Received: February 17, 2010 10:26 AM  Date/Time Reported: February 17, 2010 10:55 AM   HGBA1C: 5.7%   (Normal Range: Non-Diabetic - 3-6%   Control Diabetic - 6-8%)  Comments: ...........test performed by...........Marland KitchenTerese Door, CMA         Prevention & Chronic Care Immunizations   Influenza vaccine: Fluvax MCR  (09/09/2009)   Influenza vaccine due: 08/04/2010    Tetanus booster: 01/04/2005: Done.   Tetanus booster due: 01/04/2015    Pneumococcal vaccine: Done.  (08/05/1999)   Pneumococcal vaccine deferral: Not indicated  (10/19/2009)   Pneumococcal vaccine due:  None    H. zoster vaccine: 10/04/2009: Zostavax  Colorectal Screening   Hemoccult: Done.  (02/01/2002)   Hemoccult due: Not Indicated    Colonoscopy: Done.  (02/01/2002)   Colonoscopy due: 02/02/2012  Other Screening   Pap smear: Done.  (01/04/2006)   Pap smear due: Not Indicated    Mammogram: normal  (09/24/2008)   Mammogram due: 09/24/2009    DXA bone density scan: Done.  (10/04/2000)   DXA bone density action/deferral: Ordered  (07/29/2009)   DXA scan due: None    Smoking status: never  (02/17/2010)  Diabetes Mellitus   HgbA1C: 5.7  (02/17/2010)   Hemoglobin A1C due: Not Indicated    Eye exam: diabetic retinopathy  (07/09/2008)   Eye exam due: 07/09/2009    Foot exam: yes  (07/29/2009)   High risk foot: Not documented   Foot care education: Not documented   Foot exam due: 07/14/2009    Urine microalbumin/creatinine ratio: Not documented   Urine microalbumin/cr due: Not Indicated    Diabetes flowsheet reviewed?: Yes   Progress toward A1C goal: At goal  Lipids   Total Cholesterol: 90  (02/07/2010)   LDL: 34  (02/07/2010)   LDL Direct: 88  (07/28/2008)   HDL: 30  (02/07/2010)   Triglycerides: 128  (02/07/2010)    SGOT (AST): 40  (02/07/2010)   SGPT (ALT): 22  (02/07/2010)   Alkaline phosphatase: 82  (02/07/2010)    Total bilirubin: 0.4  (02/07/2010)    Lipid flowsheet reviewed?: Yes   Progress toward LDL goal: At goal  Hypertension   Last Blood Pressure: 114 / 50  (02/17/2010)   Serum creatinine: 2.52  (02/07/2010)   Serum potassium 4.6  (02/07/2010)    Hypertension flowsheet reviewed?: Yes   Progress toward BP goal: At goal  Self-Management Support :   Personal Goals (by the next clinic visit) :     Personal A1C goal: 7  (07/29/2009)     Personal blood pressure goal: 130/80  (07/29/2009)     Personal LDL goal: 100  (07/29/2009)    Diabetes self-management support: Education handout  (07/29/2009)    Hypertension self-management support: Education handout  (07/29/2009)    Lipid self-management support: Not documented

## 2011-01-05 NOTE — Progress Notes (Signed)
Summary: triage hypoglycemic sx   Phone Note Call from Patient Call back at Home Phone [phone]   Caller: Patient Summary of Call: Pt would like to talk to Kennon Rounds about her diabetes. Initial call taken by: Clydell Hakim,  June 09, 2010 11:03 AM  Follow-up for Phone Call        c/o low cbgs. states she got weak. ate candy & drank coke & is better now. has happened a few times lately.  does not check sugars. unable to work the meter.  saw kidney md Dr. Caryn Section this am. son drove her.  wants to know if she should change her meds. suggested seeing md. she wants me to ask md first. told her I will call her back Follow-up by: Golden Circle RN,  June 09, 2010 11:39 AM  Additional Follow-up for Phone Call Additional follow up Details #1::        Eating well. glucometer not working well, but she'll try getting new strips.   She will stop the Januvia and call if she starts running high blood sugars.  She is also trying to get her breast biopsy scheduled.  Additional Follow-up by: Zachery Dauer MD,  June 09, 2010 4:29 PM

## 2011-01-05 NOTE — Miscellaneous (Signed)
Summary: Change to 25 mg Januvia  Clinical Lists Changes  Problems: Added new problem of OTHER ABNORMAL FINDING RADIOLOGICAL EXAM BREAST (ICD-793.89) Removed problem of UNSPECIFIED SINUSITIS (ICD-473.9) Medications: Changed medication from JANUVIA 100 MG TABS (SITAGLIPTIN PHOSPHATE) Take 1/4 tab daily to JANUVIA 25 MG TABS (SITAGLIPTIN PHOSPHATE) Take one tablet daily - Signed Rx of JANUVIA 25 MG TABS (SITAGLIPTIN PHOSPHATE) Take one tablet daily;  #30 x 0;  Signed;  Entered by: Zachery Dauer MD;  Authorized by: Zachery Dauer MD;  Method used: Historical  Has appointment tomorrow with Dr Caryn Section. She can afford the Januvia 25 mg at the new price.   Prescriptions: JANUVIA 25 MG TABS (SITAGLIPTIN PHOSPHATE) Take one tablet daily  #30 x 0   Entered and Authorized by:   Zachery Dauer MD   Signed by:   Zachery Dauer MD on 05/18/2010   Method used:   Historical   RxID:   [id number]

## 2011-01-05 NOTE — Assessment & Plan Note (Signed)
Summary: f/u visit/bmc   Vital Signs:  Patient profile:   77 year old female Menstrual status:  hysterectomy Height:      59.75 inches Weight:      147.5 pounds BMI:     29.15 Pulse rate:   82 / minute BP sitting:   130 / 71  (left arm) Cuff size:   regular  Vitals Entered By: Arlyss Repress CMA, (May 31, 2010 3:33 PM) CC: f/up and c/o leg pain. seen chiropractor last friday. dr.kramer gave shots in both knees. Is Patient Diabetic? Yes Pain Assessment Patient in pain? yes     Location: both knees Intensity: 2 Onset of pain  Chronic   Primary Care Provider:  Zachery Dauer MD  CC:  f/up and c/o leg pain. seen chiropractor last friday. dr.kramer gave shots in both knees..  History of Present Illness: Still has weak spells, but no definite hypoglycemia symptoms. Taking the whole 25 mg Januvia pill, and the 1/4 glyburide.  Want's results from ultra sound done in Dr Fox's office yesterday. I've received no report.   someone called her from [facility] surgical center, couldn't determine why.  Had shots in both knees by Dr Farris Has yesterday.   Bumped her right shin recently   Habits & Providers  Alcohol-Tobacco-Diet     Tobacco Status: never  Current Medications (verified): 1)  Plavix 75 Mg Tabs (Clopidogrel Bisulfate) .... Take One Tablet Daily 2)  Albuterol 90 Mcg/act Aers (Albuterol) .... Inhale 2 Puff Using Inhaler Four Times A Day 3)  Buspirone Hcl 15 Mg Tabs (Buspirone Hcl) .... Take 1 Tablet By Mouth Twice A Day 4)  Ecotrin Low Strength 81 Mg Tbec (Aspirin) .... Take 1 Tablet By Mouth Once A Day 5)  Florastor  (Saccharomyces Boulardii) Caps .... Take 1 Packet By Mouth Twice A Day 6)  Metoprolol Succinate 50 Mg Tb24 (Metoprolol Succinate) .... Take 1 Tablet By Mouth Once A Day 7)  Simvastatin 40 Mg  Tabs (Simvastatin) .... One Daily 8)  Lasix 20 Mg Tabs (Furosemide) .Marland Kitchen.. 1-2 As Needed For No Void 9)  Vitamin B-6 100 Mg Tabs (Pyridoxine Hcl) .... Take One Tablet Daily 10)   Allopurinol 100 Mg  Tabs (Allopurinol) .Marland Kitchen.. 1 Daily 11)  Omeprazole 20 Mg Cpdr (Omeprazole) .... Take One Tab Two Times A Day 12)  Tramadol-Acetaminophen 37.5-325 Mg Tabs (Tramadol-Acetaminophen) .... Take One Tablet Every 6 Hours As Needed Pain. 13)  Nifedipine 60 Mg Xr24h-Tab (Nifedipine) .... Take One Tablet Daily 14)  Diabetic Shoes .... Thick Nails, Callouses 15)  Januvia 25 Mg Tabs (Sitagliptin Phosphate) .... Take One Tablet Daily 16)  Triamcinolone Acetonide 0.1 % Oint (Triamcinolone Acetonide) .... Use 1-2 Times A Day For Leg Rash 17)  Calcitriol 0.5 Mcg Caps (Calcitriol) .... Take One Tab 3 Times Weekly 18)  Colcrys 0.6 Mg Tabs (Colchicine) .... Take One Tablet Daily As Needed For Gout 19)  Proair Hfa 108 (90 Base) Mcg/act Aers (Albuterol Sulfate) .... 2 Puffs Qid As Needed 20)  Flonase 50 Mcg/act Susp (Fluticasone Propionate) .... Two Squirts in Each Nostril Daily 21)  Polyethylene Glycol 3350  Powd (Polyethylene Glycol 3350) .Marland KitchenMarland KitchenMarland Kitchen 17 Gm in 4 Oz of Liquid Daily, Qs For One Month 22)  Loperamide Hcl 2 Mg Caps (Loperamide Hcl) .... Take 3 Then One After Each Loose Bowel Movement 23)  Cetirizine Hcl 10 Mg Tabs (Cetirizine Hcl) .... Otc One Qhs 24)  Isosorbide Mononitrate Cr 30 Mg Xr24h-Tab (Isosorbide Mononitrate) .... One Q Morning 25)  Nitrostat 0.4  Mg Subl (Nitroglycerin) .... One Under Tongue As Needed For Chest Pain, May Repeat in 5 Minutes Twice But If Still Having Problems Access Emergency Care, 1 Bottle  Allergies (verified): 1)  ! Novocain 2)  ! Doxycycline Hyclate (Doxycycline Hyclate) 3)  * Clonidine 4)  Codeine 5)  Benadryl 6)  * Tavist 7)  Cipro 8)  Neurontin 9)  Septra Ds (Sulfamethoxazole-Trimethoprim)  Physical Exam  General:  Less well appearing Lungs:  normal respiratory effort, normal breath sounds, and no crackles.   Heart:  normal rate and regular rhythm.   Extremities:  tr edema at ankles, no rashes except mild erythema right anterior shin from  injury Psych:  moderately anxious.     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 Take 1/4  tab one times a day Her updated medication list for this problem includes:    Ecotrin Low Strength 81 Mg Tbec (Aspirin) .Marland Kitchen... Take 1 tablet by mouth once a day    Januvia 25 Mg Tabs (Sitagliptin phosphate) .Marland Kitchen... Take one tablet daily  Orders: A1C-[facility] (04540) [facility]- Est  Level 4 (98119)  Problem # 2:  OLIGURIA (ICD-788.5) ARF being evaluated by Dr Caryn Section  Problem # 3:  HYPERTENSION (ICD-401.9) Fair control Her updated medication list for this problem includes:    Metoprolol Succinate 50 Mg Tb24 (Metoprolol succinate) .Marland Kitchen... Take 1 tablet by mouth once a day    Lasix 20 Mg Tabs (Furosemide) .Marland Kitchen... 1-2 as needed for no void    Nifedipine 60 Mg Xr24h-tab (Nifedipine) .Marland Kitchen... Take one tablet daily  Orders: [facility]- Est  Level 4 (14782)  Problem # 4:  ASTHMA (ICD-493.90) Not currently active Her updated medication list for this problem includes:    Albuterol 90 Mcg/act Aers (Albuterol) ..... Inhale 2 puff using inhaler four times a day    Proair Hfa 108 (90 Base) Mcg/act Aers (Albuterol sulfate) .Marland Kitchen... 2 puffs qid as needed  Complete Medication List: 1)  Plavix 75 Mg Tabs (Clopidogrel bisulfate) .... Take one tablet daily 2)  Albuterol 90 Mcg/act Aers (Albuterol) .... Inhale 2 puff using inhaler four times a day 3)  Buspirone Hcl 15 Mg Tabs (Buspirone hcl) .... Take 1 tablet by mouth twice a day 4)  Ecotrin Low Strength 81 Mg Tbec (Aspirin) .... Take 1 tablet by mouth once a day 5)  Florastor (saccharomyces Boulardii) Caps  .... Take 1 packet by mouth twice a day 6)  Metoprolol Succinate 50 Mg Tb24 (Metoprolol succinate) .... Take 1 tablet by mouth once a day 7)  Simvastatin 40 Mg Tabs (Simvastatin)  .... One daily 8)  Lasix 20 Mg Tabs (Furosemide) .Marland Kitchen.. 1-2 as needed for no void 9)  Vitamin B-6 100 Mg Tabs (Pyridoxine hcl) .... Take one tablet daily 10)  Allopurinol 100 Mg Tabs (Allopurinol) .Marland Kitchen.. 1 daily 11)  Omeprazole 20 Mg Cpdr (Omeprazole) .... Take one tab two times a day 12)  Tramadol-acetaminophen 37.5-325 Mg Tabs (Tramadol-acetaminophen) .... Take one tablet every 6 hours as needed pain. 13)  Nifedipine 60 Mg Xr24h-tab (Nifedipine) .... Take one tablet daily 14)  Diabetic Shoes  .... Thick nails, callouses 15)  Januvia 25 Mg Tabs (Sitagliptin phosphate) .... Take one tablet daily 16)  Triamcinolone Acetonide 0.1 % Oint (Triamcinolone acetonide) .... Use 1-2 times a day for leg rash 17)  Calcitriol 0.5 Mcg Caps (  Calcitriol) .... Take one tab 3 times weekly 18)  Colcrys 0.6 Mg Tabs (Colchicine) .... Take one tablet daily as needed for gout 19)  Proair Hfa 108 (90 Base) Mcg/act Aers (Albuterol sulfate) .... 2 puffs qid as needed 20)  Flonase 50 Mcg/act Susp (Fluticasone propionate) .... Two squirts in each nostril daily 21)  Polyethylene Glycol 3350 Powd (Polyethylene glycol 3350) .Marland KitchenMarland Kitchen. 17 gm in 4 oz of liquid daily, qs for one month 22)  Loperamide Hcl 2 Mg Caps (Loperamide hcl) .... Take 3 then one after each loose bowel movement 23)  Cetirizine Hcl 10 Mg Tabs (Cetirizine hcl) .... Otc one qhs 24)  Isosorbide Mononitrate Cr 30 Mg Xr24h-tab (Isosorbide mononitrate) .... One q morning 25)  Nitrostat 0.4 Mg Subl (Nitroglycerin) .... One under tongue as needed for chest pain, may repeat in 5 minutes twice but if still having problems access emergency care, 1 bottle  Patient Instructions: 1)  Stop the Glyburide since your A1c was normal at 5.9. Continue the Januvia for your diabetes.  2)  Please schedule a follow-up appointment in 1 month.   Laboratory Results   Blood Tests   Date/Time Received: May 31, 2010 3:41 PM  Date/Time Reported: May 31, 2010 3:59 PM   HGBA1C: 5.9%    (Normal Range: Non-Diabetic - 3-6%   Control Diabetic - 6-8%)  Comments: ..................test performed by..............Marland KitchenTessie Fass CMA       Prevention & Chronic Care Immunizations   Influenza vaccine: Fluvax MCR  (09/09/2009)   Influenza vaccine due: 08/04/2010    Tetanus booster: 01/04/2005: Done.   Tetanus booster due: 01/04/2015    Pneumococcal vaccine: Done.  (08/05/1999)   Pneumococcal vaccine deferral: Not indicated  (10/19/2009)   Pneumococcal vaccine due: None    H. zoster vaccine: 10/04/2009: Zostavax  Colorectal Screening   Hemoccult: Done.  (02/01/2002)   Hemoccult due: Not Indicated    Colonoscopy: Done.  (02/01/2002)   Colonoscopy due: 02/02/2012  Other Screening   Pap smear: Done.  (01/04/2006)   Pap smear due: Not Indicated    Mammogram: Assessment: BIRADS 4. Location: [facility].    (05/12/2010)   Mammogram action/deferral: Recheck in 6 mos. to consider biopsy for low risk microcalcifications  (05/12/2010)   Mammogram due: 09/24/2009    DXA bone density scan: Done.  (10/04/2000)   DXA bone density action/deferral: Ordered  (07/29/2009)   DXA scan due: None    Smoking status: never  (05/31/2010)  Diabetes Mellitus   HgbA1C: 5.9  (05/31/2010)   Hemoglobin A1C due: Not Indicated    Eye exam: diabetic retinopathy  (07/09/2008)   Eye exam due: 07/09/2009    Foot exam: yes  (07/29/2009)   High risk foot: Not documented   Foot care education: Not documented   Foot exam due: 07/14/2009    Urine microalbumin/creatinine ratio: Not documented   Urine microalbumin/cr due: Not Indicated    Diabetes flowsheet reviewed?: Yes   Progress toward A1C goal: At goal  Lipids   Total Cholesterol: 142  (03/10/2010)   LDL: 72  (03/10/2010)   LDL Direct: 88  (07/28/2008)   HDL: 35  (03/10/2010)   Triglycerides: 177  (03/10/2010)    SGOT (AST): 40  (02/07/2010)   SGPT (ALT): 22  (02/07/2010)   Alkaline phosphatase: 82   (02/07/2010)   Total bilirubin: 0.4  (02/07/2010)    Lipid flowsheet reviewed?: Yes   Progress toward LDL goal: Unchanged  Hypertension   Last Blood Pressure: 130 / 71  (  05/31/2010)   Serum creatinine: 2.67  (05/12/2010)   Serum potassium 5.1  (05/12/2010)    Hypertension flowsheet reviewed?: Yes   Progress toward BP goal: At goal  Self-Management Support :   Personal Goals (by the next clinic visit) :     Personal A1C goal: 7  (07/29/2009)     Personal blood pressure goal: 130/80  (07/29/2009)     Personal LDL goal: 100  (07/29/2009)    Diabetes self-management support: Education handout  (07/29/2009)    Hypertension self-management support: Education handout  (07/29/2009)    Lipid self-management support: Not documented

## 2011-01-05 NOTE — Assessment & Plan Note (Signed)
Summary: no improvement.seenotes/[place]/hale   Vital Signs:  Patient profile:   77 year old female Menstrual status:  hysterectomy Height:      59.75 inches Weight:      140 pounds BMI:     27.67 Temp:     97.9 degrees F oral BP sitting:   138 / 60  (right arm) Cuff size:   regular  Vitals Entered By: Tessie Fass CMA (January 03, 2010 11:18 AM) CC: sore throat and chills Is Patient Diabetic? Yes Pain Assessment Patient in pain? yes     Location: chest and head   Primary Care Provider:  Zachery Dauer MD  CC:  sore throat and chills.  History of Present Illness: Still complaining of sinus problems that has kept her in bed for weeks now.  Prescribed doxycycline several weeks ago, mostly because she had redeveloped a sore in her nose again.  Afer 4 doses broke out in a painful rash in her perineum, that her grandaughter thought it looked like shingles.  The rash was on both sides and up into her vagina.  Belinda [name] felt it was due to the antibiotic and we stopped the medication.  She also complicated the picture by taking a laxative at the same time that caused explosive diarrhea.  She has been very upset.  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  She in turn sees this as a barrier to her driving, because of she has to take a visual test for this in the next few weeks or she will lose her abiltiy to drive.  Currently she is limited to 50 miles from home.  Habits & Providers  Alcohol-Tobacco-Diet     Tobacco Status: never  Allergies: 1)  ! Novocain 2)  ! Doxycycline Hyclate (Doxycycline Hyclate) 3)  * Clonidine 4)  Codeine 5)  Benadryl 6)  * Tavist 7)  Cipro 8)  Neurontin 9)  Septra Ds (Sulfamethoxazole-Trimethoprim)  Physical Exam  General:  Highly  anxious Ears:  TM grey and flat Nose:  red, no drainage Mouth:  missing teeth, dry mucus membranes, no post nasal draiange Lungs:  normal respiratory effort and normal breath sounds.   Heart:  normal rate and regular rhythm.     Impression & Recommendations:  Problem # 1:  IBS (ICD-564.1)  Trial of PEG, she gets constipated and takes laxative then has diarrhea, chronic cycle.  Orders: [facility]- Est  Level 4 (16109)  Problem # 2:  UNSPECIFIED SINUSITIS (ICD-473.9)  counseled on the risk of antibiotics, that now she seems to have an allergy to both oral meds used to treat MRSA.  She was willing to take the risk and believes she will improve with antibotic, prescribed Amox.  Continue flonase and saline nasal spray. The following medications were removed from the medication list:    Doxycycline Hyclate 100 Mg Tabs (Doxycycline hyclate) ..... One tab two times a day for 7 days Her updated medication list for this problem includes:    Flonase 50 Mcg/act Susp (Fluticasone propionate) .Marland Kitchen..Marland Kitchen Two squirts in each nostril daily    Amoxicillin 500 Mg Caps (Amoxicillin) ..... One cap three times a day for 7 days  Orders: [facility]- Est  Level 4 (99214)  Problem # 3:  CHRONIC KIDNEY DISEASE STAGE III (MODERATE) (  ICD-585.3) Check renal function today, had a bump in creatitine last month Orders: Basic Met-[facility] (54098-11914) [facility]- Est  Level 4 (78295)  Complete Medication List: 1)  Plavix 75 Mg Tabs (Clopidogrel bisulfate) .... Take one tablet daily 2)  Albuterol 90 Mcg/act Aers (Albuterol) .... Inhale 2 puff using inhaler four times a day 3)  Buspirone Hcl 15 Mg Tabs (Buspirone hcl) .... Take 1 tablet by mouth twice a day 4)  Ecotrin Low Strength 81 Mg Tbec (Aspirin) .... Take 1 tablet by mouth once a day 5)  Florastor (saccharomyces Boulardii) Caps  .... Take 1 packet by mouth twice a day 6)  Metoprolol Succinate 50 Mg Tb24 (Metoprolol succinate) .... Take 1 tablet by mouth once a day 7)  Simvastatin 40  Mg Tabs (Simvastatin) .... One daily 8)  Lasix 20 Mg Tabs (Furosemide) .... Take 2  tablets by mouth each a.m. 9)  Vitamin B-6 100 Mg Tabs (Pyridoxine hcl) .... Take one tablet daily 10)  Allopurinol 100 Mg Tabs (Allopurinol) .... 1.5 daily 11)  Omeprazole 20 Mg Cpdr (Omeprazole) .... Take one tab two times a day 12)  Tramadol-acetaminophen 37.5-325 Mg Tabs (Tramadol-acetaminophen) .... Take one tablet every 6 hours as needed pain. 13)  Glyburide 5 Mg Tabs (Glyburide) .... Take 1/2 tab two times a day 14)  Nifedipine 60 Mg Xr24h-tab (Nifedipine) .... Take one tablet daily 15)  Zostavax 62130 Unt/0.60ml Solr (Zoster vaccine live) .... Dispense and inject x 1 16)  Diabetic Shoes  .... Thick nails, callouses 17)  Januvia 100 Mg Tabs (Sitagliptin phosphate) .... Take 1/4 tab daily 18)  Triamcinolone Acetonide 0.1 % Oint (Triamcinolone acetonide) .... Use 1-2 times a day for leg rash 19)  Mupirocin 2 % Oint (Mupirocin) .... Apply to sores in nose, 22 gm 20)  Calcitriol 0.5 Mcg Caps (Calcitriol) .... Take one tab 3 times weekly 21)  Colcrys 0.6 Mg Tabs (Colchicine) .... Take one tablet daily as directed 22)  Proair Hfa 108 (90 Base) Mcg/act Aers (Albuterol sulfate) .... 2 puffs qid as needed 23)  Flonase 50 Mcg/act Susp (Fluticasone propionate) .... Two squirts in each nostril daily 24)  Amoxicillin 500 Mg Caps (Amoxicillin) .... One cap three times a day for 7 days 25)  Polyethylene Glycol 3350 Powd (Polyethylene glycol 3350) .Marland KitchenMarland Kitchen. 17 gm in 4 oz of liquid daily, qs for one month  Patient Instructions: 1)  Aytime we use an antibiotic we put you at risk for development of complication 2)  SInce you are not improving and you feel that you are ill since early in January with sinus problems will try one week of Amoxicillin 3)  I have added doxycycline to you allergy list 4)  It is important that you drink a lot of fluids 5)  It is important that you keep your bowel moving so you do not have to use  strong laxative-I have prescribed Miralax (PEG) to use dail. 6)  Return in 2 weeks with Dr. Sheffield Slider Prescriptions: POLYETHYLENE GLYCOL 3350  POWD (POLYETHYLENE GLYCOL 3350) 17 GM in 4 oz of liquid daily, QS for one month  #1 x 6   Entered and Authorized by:   Luretha Murphy NP   Signed by:   Luretha Murphy NP on 01/03/2010   Method used:   Electronically to        [facility] (retail)       [address]       [place], [place]  [address]  Ph: [phone]       Fax: [phone]   RxID:   [id number] NIFEDIPINE 60 MG XR24H-TAB (NIFEDIPINE) Take one tablet daily  #30 x 6   Entered and Authorized by:   Luretha Murphy NP   Signed by:   Luretha Murphy NP on 01/03/2010   Method used:   Electronically to        [facility] [address] (retail)       [address]       [place], [place]  [address]       Ph: [phone]       Fax: [phone]   RxID:   [id number] AMOXICILLIN 500 MG CAPS (AMOXICILLIN) one cap three times a day for 7 days  #21 x 0   Entered and Authorized by:   Luretha Murphy NP   Signed by:   Luretha Murphy NP on 01/03/2010   Method used:   Electronically to        [facility] (retail)       [address]       [place], [place]  [id number]       Ph: [phone]       Fax: [phone]   RxID:   [id number]

## 2011-01-05 NOTE — Progress Notes (Signed)
Summary: Rx Prob  Phone Note Call from Patient Call back at [facility] Phone [phone]   Caller: Patient Summary of Call: Thinks she has an error with her [place] Amber that she picked up.   Initial call taken by: Clydell Hakim,  May 18, 2010 2:11 PM  Follow-up for Phone Call        spoke wiith patient and she just picked up the [place] and states it is a 25 mg tab with directions to  take 1/4 tab daily. RN called pharmacist at [facility]) and explained  the problem and he states that directions should have been changed to one tablet daily. they gave her 25 mg tabs because the manufactor does not recommend cutting tablet.  he will call patient to correct. patient notified to be expecting a call from [place]. the cost of # 30 tabs of the 25 mg tablet , and 100 mg  taking 1/4 tab daily  QS for a month is the same $45.00. Follow-up by: Theresia Lo RN,  May 18, 2010 2:44 PM

## 2011-01-05 NOTE — Miscellaneous (Signed)
Summary: meds  Clinical Lists Changes called pt lmvm to return call. please tell Ms Langan DO NOT take the otc pain releiver cobroxin. she had asked about it at her last visit.Tessie Fass CMA  December 21, 2010 5:35 PM pt came into office today, informed not to take the cobroxin per Dr Earlene Plater.Tessie Fass CMA  December 22, 2010 9:46 AM

## 2011-01-05 NOTE — Letter (Signed)
Summary: *Referral Letter  [facility]  [address]   [place], [place] [address]   Phone: [phone]  Fax: [phone]    02/17/2010  Thank you in advance for agreeing to see my patient:  Belinda [name] [address] [place], [place]  [address]  Phone: [phone]  Reason for Referral: follow-up of renal failure Procedures Requested: Please check hemoglobin and uric acid with your lab tests. A1c was 5.6 today so Glyburide was decreased and I may need to stop it or Januvia.   Current Medical Problems: 1)  DIARRHEA, RECURRENT (ICD-787.91) 2)  UNSPECIFIED SINUSITIS (ICD-473.9) 3)  FATIGUE (ICD-780.79) 4)  HYPERTENSION (ICD-401.9) 5)  ANXIETY (ICD-300.00) 6)  DIABETES MELLITUS, II, COMPLICATIONS (ICD-250.92) 7)      RETINOPATHY, DIABETIC, PROLIFERATIVE (ICD-362.02) 8)          VISUAL ACUITY, DECREASED, RIGHT EYE (ICD-369.9) 9)      NEUROPATHY, DIABETIC (ICD-250.60) 10)  CHRONIC KIDNEY DISEASE STAGE III (MODERATE) (ICD-585.3) 11)      ANEMIA IN CHRONIC KIDNEY DISEASE (ICD-285.21) 12)      NEPHRECTOMY, HX OF (ICD-V45.73) 13)      NEPHROLITHIASIS, HX OF (ICD-V13.01) 14)  ACUTE GOUTY ARTHROPATHY (ICD-274.01) 15)      PAIN IN JOINT, HAND (ICD-719.44) 16)      PSEUDOGOUT (ICD-275.49) 17)  ATHEROSCLEROTIC CARDIOVASCULAR DISEASE (ICD-429.2) 18)  URINARY INCONTINENCE, URGE (ICD-788.31) 19)  BASAL CELL CARCINOMA, HX OF (ICD-V10.83) 20)  LUMBAR SPINAL STENOSIS (ICD-724.02) 21)      UNSTEADY GAIT (ICD-781.2) 22)  HERNIA, SITE NOS W/O OBSTRUCTION/GANGRENE (ICD-553.9) 23)      GASTROESOPHAGEAL REFLUX, NO ESOPHAGITIS (ICD-530.81) 24)  IBS (ICD-564.1) 25)  ASTHMA (ICD-493.90) 26)  VARICOSE VEINS (ICD-454.9) 27)  RHINITIS, ALLERGIC (ICD-477.9) 28)  HYPERLIPIDEMIA (ICD-272.4) 29)  HEARING LOSS NOS OR DEAFNESS (ICD-389.9) 30)  DIVERTICULOSIS OF COLON (ICD-562.10) 31)  CATARACT (ICD-366.9)   Current Medications: 1)  PLAVIX 75 MG TABS (CLOPIDOGREL BISULFATE)  Take one tablet daily 2)  ALBUTEROL 90 MCG/ACT AERS (ALBUTEROL) Inhale 2 puff using inhaler four times a day 3)  BUSPIRONE HCL 15 MG TABS (BUSPIRONE HCL) Take 1 tablet by mouth twice a day 4)  ECOTRIN LOW STRENGTH 81 MG TBEC (ASPIRIN) Take 1 tablet by mouth once a day 5)  * FLORASTOR  (SACCHAROMYCES BOULARDII) CAPS Take 1 packet by mouth twice a day 6)  METOPROLOL SUCCINATE 50 MG TB24 (METOPROLOL SUCCINATE) Take 1 tablet by mouth once a day 7)  SIMVASTATIN 40 MG  TABS (SIMVASTATIN) one daily 8)  LASIX 20 MG TABS (FUROSEMIDE) Take 2  tablets by mouth each A.M. 9)  VITAMIN B-6 100 MG TABS (PYRIDOXINE HCL) Take one tablet daily 10)  ALLOPURINOL 100 MG  TABS (ALLOPURINOL) 1.5 daily 11)  OMEPRAZOLE 20 MG CPDR (OMEPRAZOLE) Take one tab two times a day 12)  TRAMADOL-ACETAMINOPHEN 37.5-325 MG TABS (TRAMADOL-ACETAMINOPHEN) Take one tablet every 6 hours as needed pain. 13)  GLYBURIDE 5 MG TABS (GLYBURIDE) Take 1/4  tab one times a day 14)  NIFEDIPINE 60 MG XR24H-TAB (NIFEDIPINE) Take one tablet daily 15)  * DIABETIC SHOES thick nails, callouses [BMN] 16)  JANUVIA 100 MG TABS (SITAGLIPTIN PHOSPHATE) Take 1/4 tab daily 17)  TRIAMCINOLONE ACETONIDE 0.1 % OINT (TRIAMCINOLONE ACETONIDE) Use 1-2 times a day for leg rash 18)  MUPIROCIN 2 % OINT (MUPIROCIN) apply to sores in nose, 22 gm 19)  CALCITRIOL 0.5 MCG CAPS (CALCITRIOL) Take one tab 3 times weekly 20)  COLCRYS 0.6 MG TABS (COLCHICINE) Take one tablet daily as  directed 21)  PROAIR HFA 108 (90 BASE) MCG/ACT AERS (ALBUTEROL SULFATE) 2 puffs qid as needed 22)  FLONASE 50 MCG/ACT SUSP (FLUTICASONE PROPIONATE) two squirts in each nostril daily 23)  POLYETHYLENE GLYCOL 3350  POWD (POLYETHYLENE GLYCOL 3350) 17 GM in 4 oz of liquid daily, QS for one month 24)  LOPERAMIDE HCL 2 MG CAPS (LOPERAMIDE HCL) Take 3 then one after each loose bowel movement   Past Medical History: 1)  EGD antral polyp benign - 03/04/2002 2)  diabetic reinopathy 3)  CT -Lumbar Spine-,  CT pelvis-negative - 01/01/2001 4)  spinal stenosis L4,5 L3,4 - 01/01/2001 5)  colonoscopy int hem - 03/04/2002  6)  MRI of back spinal stenosis - 05/21/2003  7)  CT - Abdominal & pelvic - 02/02/2004 8)  CT sinuses neg Dr Christella Hartigan - 02/09/2004 9)  L paraspinal hernia post surgery per CT 10)  CT - Abdominal-hernia - 06/19/2005 11)  hypoglycemia on glyburide  12)  GI camera pill study negative - 01/30/2006 13)  bifascicular block-EKG 14)  5-HIAA normal - 01/30/2006 15)  audiogram - bilat sens/neuro loss - 10/14/2006 16)  CT - Abd&pelvic -   - 06/09/2005 17)  Cardiolite normal EF 54% - 01/30/2006 18)  UGI endoscopy duodenal scar benign bx - 11/12/2006 19)  PFT`s - obstructive disease - 01/30/2006 20)  creatinine clearance 26 - 10/14/2006 21)  PVR 17 cc - 01/08/2007 22)  Stress cardiolite Neg EF 77% - 04/18/2000 TSH nl - 08/05/2003, 23)  ortho- dr. [facility]arris [facility] at [facility] 24)  cards- Dr. Amil Amen  Thank you again for agreeing to see our patient; please contact us if you have any further questions or need additional information.  Sincerely,  Zachery Dauer MD  Appended Document: *Referral Letter letter faxed to dr.fox

## 2011-01-05 NOTE — Consult Note (Signed)
Summary: [facility]  [facility]   Imported By: Bradly Bienenstock 03/10/2010 09:28:30  _____________________________________________________________________  External Attachment:    Type:   Image     Comment:   External Document

## 2011-01-05 NOTE — Consult Note (Signed)
Summary: [facility]  [facility]   Imported By: Clydell Hakim 06/30/2010 16:38:59  _____________________________________________________________________  External Attachment:    Type:   Image     Comment:   External Document

## 2011-01-05 NOTE — Miscellaneous (Signed)
Summary: labs faxed to Dr.Fox/TS  Clinical Lists Changes faxed labs and latest ov's to dr.fox. pt has upcoming appt with dr.fox 02-25-10 Arlyss Repress CMA,  February 17, 2010 12:01 PM

## 2011-01-05 NOTE — Progress Notes (Signed)
Summary: Triage sinusitis  Phone Note Call from Patient   Caller: Patient Call For: Zachery Dauer MD Summary of Call: Ms. Krenzer received notice from [facility] she needed to come and renew her license.  She said she is still not well and cannot go right now.  She want you to call her what else she can take for her congestion.  She still not feeling well.  Please call her [phone] Initial call taken by: Britta Mccreedy mcgregor  Follow-up for Phone Call        pt calling again and would like to speak with RN about this, sts she is having a hard time seeing b/c of all the congestion. Follow-up by: Knox Royalty,  January 10, 2010 1:21 PM  Additional Follow-up for Phone Call Additional follow up Details #1::        she is very concerned that she is going to lose her license. states she has been sick x 6 wks with no improvement. has appt with DMV this week & is currently unable to go. wants pcp to give her something that will cure her & write a letter to [facility]. message to pcp Additional Follow-up by: Golden Circle RN,  January 10, 2010 1:44 PM    Additional Follow-up for Phone Call Additional follow up Details #2::    Continues stopped up head, blurry vision, nose, throat,ears sore, thick drainage. Only could take Amoxicillin two times a day because it made her feel crazy.   Headache, frontal. No fever and no longer spitting up. Can't continue laxative recently prescribed due to it causing diarrhea.  Due for mammogram today. DMV wants her to come back by tomorrow. Due to see eye doctor from [facility] by Thursday. Plan Avelox 400 mg daily x 5 days with Afrin 1 spray two times a day x 3 days. Call if not improved in 3 days and will make ENT referral Follow-up by: Zachery Dauer MD,  January 11, 2010 9:48 AM  New/Updated Medications: AVELOX 400 MG TABS (MOXIFLOXACIN HCL) Take one tab daily Prescriptions: AVELOX 400 MG TABS (MOXIFLOXACIN HCL) Take one tab daily  #5 x 0   Entered and Authorized by:   Zachery Dauer MD  Signed by:   Zachery Dauer MD on 01/11/2010   Method used:   Electronically to        [id number] (retail)       [address]       [place], [place]  [id number]       Ph: [phone]       Fax: [phone]   RxID:   [id number]

## 2011-01-05 NOTE — Letter (Signed)
Summary: *Referral Letter  [facility]  [address]   [place], [place] [address]   Phone: [phone]  Fax: [phone]    05/18/2010  Thank you in advance for agreeing to see my patient:  Belinda [name] [address] [place], [place]  [address]  Phone: [phone]  Reason for Referral: follow-up of CKD. Note recent finding of abnormal calcifications on mammogram. She's deferring biopsy till after renal evaluation. 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) 12)      RETINOPATHY, DIABETIC, PROLIFERATIVE (ICD-362.02) 13)          VISUAL ACUITY, DECREASED, RIGHT EYE (ICD-369.9) 14)      NEUROPATHY, DIABETIC (ICD-250.60) 15)  CHRONIC KIDNEY DISEASE STAGE III (MODERATE) (ICD-585.3) 16)      ANEMIA IN CHRONIC KIDNEY DISEASE (ICD-285.21) 17)      NEPHRECTOMY, HX OF (ICD-V45.73) 18)      NEPHROLITHIASIS, HX OF (ICD-V13.01) 19)  ACUTE GOUTY ARTHROPATHY (ICD-274.01) 20)      PAIN IN JOINT, HAND (ICD-719.44) 21)      PSEUDOGOUT (ICD-275.49) 22)  ATHEROSCLEROTIC CARDIOVASCULAR DISEASE (ICD-429.2) 23)  URINARY INCONTINENCE, URGE (ICD-788.31) 24)  BASAL CELL CARCINOMA, HX OF (ICD-V10.83) 25)  LUMBAR SPINAL STENOSIS (ICD-724.02) 26)      UNSTEADY GAIT (ICD-781.2) 27)  HERNIA, SITE NOS W/O OBSTRUCTION/GANGRENE (ICD-553.9) 28)      GASTROESOPHAGEAL REFLUX, NO ESOPHAGITIS (ICD-530.81) 29)  IBS (ICD-564.1) 30)  ASTHMA (ICD-493.90) 31)  VARICOSE VEINS (ICD-454.9) 32)  RHINITIS, ALLERGIC (ICD-477.9) 33)  HYPERLIPIDEMIA (ICD-272.4) 34)  HEARING  LOSS NOS OR DEAFNESS (ICD-389.9) 35)  DIVERTICULOSIS OF COLON (ICD-562.10) 36)  CATARACT (ICD-366.9)   Current Medications: 1)  PLAVIX 75 MG TABS (CLOPIDOGREL BISULFATE) Take one tablet daily 2)  ALBUTEROL 90 MCG/ACT AERS (ALBUTEROL) Inhale 2 puff using inhaler four times a day 3)  BUSPIRONE HCL 15 MG TABS (BUSPIRONE HCL) Take 1 tablet by mouth twice a day 4)  ECOTRIN LOW STRENGTH 81 MG TBEC (ASPIRIN) Take 1 tablet by mouth once a day 5)  * FLORASTOR  (SACCHAROMYCES BOULARDII) CAPS Take 1 packet by mouth twice a day 6)  METOPROLOL SUCCINATE 50 MG TB24 (METOPROLOL SUCCINATE) Take 1 tablet by mouth once a day 7)  SIMVASTATIN 40 MG  TABS (SIMVASTATIN) one daily 8)  LASIX 20 MG TABS (FUROSEMIDE) 1-2 as needed for no void 9)  VITAMIN B-6 100 MG TABS (PYRIDOXINE HCL) Take one tablet daily 10)  ALLOPURINOL 100 MG  TABS (ALLOPURINOL) 1.5 daily 11)  OMEPRAZOLE 20 MG CPDR (OMEPRAZOLE) Take one tab two times a day 12)  TRAMADOL-ACETAMINOPHEN 37.5-325 MG TABS (TRAMADOL-ACETAMINOPHEN) Take one tablet every 6 hours as needed pain. 13)  GLYBURIDE 5 MG TABS (GLYBURIDE) Take 1/4  tab one times a day 14)  NIFEDIPINE 60 MG XR24H-TAB (NIFEDIPINE) Take one tablet daily 15)  * DIABETIC SHOES thick nails, callouses [BMN] 16)  JANUVIA 25 MG TABS (SITAGLIPTIN PHOSPHATE) Take one tablet daily 17)  TRIAMCINOLONE ACETONIDE 0.1 % OINT (TRIAMCINOLONE ACETONIDE) Use 1-2 times a day for leg rash 18)  CALCITRIOL 0.5 MCG CAPS (CALCITRIOL) Take one tab 3 times weekly 19)  COLCRYS 0.6 MG TABS (COLCHICINE) Take one tablet daily  as directed 20)  PROAIR HFA 108 (90 BASE) MCG/ACT AERS (ALBUTEROL SULFATE) 2 puffs qid as needed 21)  FLONASE 50 MCG/ACT SUSP (FLUTICASONE PROPIONATE) two squirts in each nostril daily 22)  POLYETHYLENE GLYCOL 3350  POWD (POLYETHYLENE GLYCOL 3350) 17 GM in 4 oz of liquid daily, QS for one month 23)  LOPERAMIDE HCL 2 MG CAPS (LOPERAMIDE HCL) Take 3 then one after each loose bowel movement 24)   CETIRIZINE HCL 10 MG TABS (CETIRIZINE HCL) OTC one qhs 25)  ISOSORBIDE MONONITRATE CR 30 MG XR24H-TAB (ISOSORBIDE MONONITRATE) one q morning 26)  NITROSTAT 0.4 MG SUBL (NITROGLYCERIN) one under tongue as needed for chest pain, may repeat in 5 minutes twice but if still having problems access emergency care, 1 bottle   Past Medical History: 1)  EGD antral polyp benign - 03/04/2002 2)  diabetic reinopathy 3)  CT -Lumbar Spine-, CT pelvis-negative - 01/01/2001 4)  spinal stenosis L4,5 L3,4 - 01/01/2001 5)  colonoscopy int hem - 03/04/2002  6)  MRI of back spinal stenosis - 05/21/2003  7)  CT - Abdominal & pelvic - 02/02/2004 8)  CT sinuses neg Dr Christella Hartigan - 02/09/2004 9)  L paraspinal hernia post surgery per CT 10)  CT - Abdominal-hernia - 06/19/2005 11)  hypoglycemia on glyburide  12)  GI camera pill study negative - 01/30/2006 13)  bifascicular block-EKG 14)  5-HIAA normal - 01/30/2006 15)  audiogram - bilat sens/neuro loss - 10/14/2006 16)  CT - Abd&pelvic -   - 06/09/2005 17)  Cardiolite normal EF 54% - 01/30/2006 18)  UGI endoscopy duodenal scar benign bx - 11/12/2006 19)  PFT`s - obstructive disease - 01/30/2006 20)  creatinine clearance 26 - 10/14/2006 21)  PVR 17 cc - 01/08/2007 22)  Stress cardiolite Neg EF 77% - 04/18/2000 TSH nl - 08/05/2003, 23)  ortho- dr. Farris Has at [facility] 24)  cards- Dr. Amil Amen   Thank you again for agreeing to see our patient; please contact us if you have any further questions or need additional information.  Sincerely,  Zachery Dauer MD  Appended Document: *Referral Letter faxed to Dr. Scherrie Gerlach office.

## 2011-01-05 NOTE — Progress Notes (Signed)
Summary: Triagemfor diarrhea and weakness  Phone Note Call from Patient Call back at [phone]   Reason for Call: Talk to Nurse Summary of Call: pt having problems with her DM and diarrhea Initial call taken by: Knox Royalty,  July 26, 2010 3:07 PM  Follow-up for Phone Call        line remains busy Follow-up by: Golden Circle RN,  July 26, 2010 3:22 PM  Additional Follow-up for Phone Call Additional follow up Details #1::        still busy Additional Follow-up by: Golden Circle RN,  July 26, 2010 3:42 PM    Additional Follow-up for Phone Call Additional follow up Details #2::    still busy. LM on another number in the chart. will make appt when she calls back Follow-up by: Golden Circle RN,  July 26, 2010 4:07 PM  Additional Follow-up for Phone Call Additional follow up Details #3:: Details for Additional Follow-up Action Taken: Past 2 days she has had diarrhea and spells of diaphoresis and weakness. She has been eating cake icing assuming her blood sugar was low. No longer on hypoglycemics and capillary blood glucose in the AM yesterday was 190. Her diarrhea has been profuse and she's been drining water to prevent dehydration. Wonders if she should take an immodium. I recommende that she do and that she come in the AM for blood work if the diarrhea improves. If she improves but wishes to be checked she can come to geriatric clinic Thurs AM.  Additional Follow-up by: Zachery Dauer MD,  July 26, 2010 8:25 PM

## 2011-01-05 NOTE — Progress Notes (Signed)
Summary: Triage  Phone Note Call from Patient Call back at Home Phone [phone]   Reason for Call: Talk to Nurse Summary of Call: pts son is requesting an appt asap for pt, she thinks her cancer is back. Initial call taken by: Knox Royalty,  December 23, 2009 10:05 AM  Follow-up for Phone Call        son states pt thinks her cancer is back "in her private parts" told him Dr. Sheffield Slider not available, wanted S. Saxon. appt made for tomorrow am at 11 Follow-up by: Golden Circle RN,  December 23, 2009 10:14 AM

## 2011-01-05 NOTE — Assessment & Plan Note (Signed)
Summary: F/U/KH   Vital Signs:  Patient profile:   77 year old female Menstrual status:  hysterectomy Height:      59.75 inches Weight:      142.3 pounds BMI:     28.13 Pulse rate:   70 / minute BP sitting:   152 / 77  (right arm) Cuff size:   regular  Vitals Entered By: Arlyss Repress CMA, (August 16, 2010 1:32 PM) CC: f/up last OV. refill HTN meds. has not been taking them x 3 days. Is Patient Diabetic? Yes Pain Assessment Patient in pain? yes     Location: stomach Intensity: 3 Onset of pain  Chronic   Primary Care Provider:  Zachery Dauer MD  CC:  f/up last OV. refill HTN meds. has not been taking them x 3 days.Marland Kitchen  History of Present Illness: 2 episodes of hypoglycemia symptoms in AM 3 days ago, sweating. capillary blood glucoses 131,105,112,200,105  Hears noises in left ear. Itchy areas inside.  Dr Madilyn Fireman stopped her Florastor. The gastric emptying study was normal.   Habits & Providers  Alcohol-Tobacco-Diet     Tobacco Status: never  Current Medications (verified): 1)  Plavix 75 Mg Tabs (Clopidogrel Bisulfate) .... Take One Tablet Daily 2)  Albuterol 90 Mcg/act Aers (Albuterol) .... Inhale 2 Puff Using Inhaler Four Times A Day 3)  Buspirone Hcl 15 Mg Tabs (Buspirone Hcl) .... Take 1 Tablet By Mouth Twice A Day 4)  Ecotrin Low Strength 81 Mg Tbec (Aspirin) .... Take 1 Tablet By Mouth Once A Day 5)  Metoprolol Succinate 50 Mg Tb24 (Metoprolol Succinate) .... Take 1 Tablet By Mouth Once A Day 6)  Simvastatin 40 Mg  Tabs (Simvastatin) .... One Daily 7)  Vitamin B-6 100 Mg Tabs (Pyridoxine Hcl) .... Take One Tablet Daily 8)  Allopurinol 100 Mg  Tabs (Allopurinol) .Marland Kitchen.. 1 Daily 9)  Omeprazole 20 Mg Cpdr (Omeprazole) .... Take One Tab Two Times A Day 10)  Tramadol-Acetaminophen 37.5-325 Mg Tabs (Tramadol-Acetaminophen) .... Take One Tablet Every 6 Hours As Needed Pain. 11)  Nifedipine 60 Mg Xr24h-Tab (Nifedipine) .... Take One Tablet Daily 12)  Diabetic Shoes ....  Thick Nails, Callouses 13)  Triamcinolone Acetonide 0.1 % Oint (Triamcinolone Acetonide) .... Use 1-2 Times A Day For Leg Rash 14)  Calcitriol 0.5 Mcg Caps (Calcitriol) .... Take One Tab 3 Times Weekly 15)  Proair Hfa 108 (90 Base) Mcg/act Aers (Albuterol Sulfate) .... 2 Puffs Qid As Needed 16)  Flonase 50 Mcg/act Susp (Fluticasone Propionate) .... Two Squirts in Each Nostril Daily 17)  Polyethylene Glycol 3350  Powd (Polyethylene Glycol 3350) .Marland KitchenMarland KitchenMarland Kitchen 17 Gm in 4 Oz of Liquid Daily, Qs For One Month 18)  Loperamide Hcl 2 Mg Caps (Loperamide Hcl) .... Take 3 Then One After Each Loose Bowel Movement 19)  Cetirizine Hcl 10 Mg Tabs (Cetirizine Hcl) .... Otc One Qhs 20)  Isosorbide Mononitrate Cr 30 Mg Xr24h-Tab (Isosorbide Mononitrate) .... One Q Morning 21)  Nitrostat 0.4 Mg Subl (Nitroglycerin) .... One Under Tongue As Needed For Chest Pain, May Repeat in 5 Minutes Twice But If Still Having Problems Access Emergency Care, 1 Bottle 22)  Bayer Contour Test  Strp (Glucose Blood) .... Test Fasting and When Symptoms of Low Blood Sugar 23)  Micatin 2 % Crea (Miconazole Nitrate) .... Apply Two Times A Day  Allergies (verified): 1)  ! Novocain 2)  ! Doxycycline Hyclate (Doxycycline Hyclate) 3)  * Clonidine 4)  Codeine 5)  Benadryl 6)  * Tavist 7)  Cipro  8)  Neurontin 9)  Septra Ds (Sulfamethoxazole-Trimethoprim)  Physical Exam  General:  Usual appearing. Not swallowing air and burping as she has often done in the past.  Ears:  R ear normal and L ear normal TMs but superficial excoriated areas inside pinnae Lungs:  normal respiratory effort, normal breath sounds, and no crackles.   Heart:  normal rate and regular rhythm.   Abdomen:  soft.  mildly tender.  Soft left flank bulge above surgical scar.  Extremities:  tr edema at ankles Psych:  mildly anxious.     Impression & Recommendations:  Problem # 1:  OTITIS EXTERNA, ECZEMATOID (ICD-380.22) Use triamcinolone cream Orders: [facility]- Est  Level  4 (45409)  Problem # 2:  HYPOGLYCEMIA (ICD-251.2) subjective. Not on hypoglycemics Orders: [facility]- Est  Level 4 (81191)  Problem # 3:  AEROPHAGIA (ICD-306.4) Improved, reason Orders: [facility]- Est  Level 4 (47829)  Problem # 4:  DIARRHEA, RECURRENT (ICD-787.91) improved Her updated medication list for this problem includes:    Loperamide Hcl 2 Mg Caps (Loperamide hcl) .Marland Kitchen... Take 3 then one after each loose bowel movement  Problem # 5:  HYPERTENSION (ICD-401.9) controlled  Her updated medication list for this problem includes:    Metoprolol Succinate 50 Mg Tb24 (Metoprolol succinate) .Marland Kitchen... Take 1 tablet by mouth once a day    Nifedipine 60 Mg Xr24h-tab (Nifedipine) .Marland Kitchen... Take one tablet daily  Orders: [facility]- Est  Level 4 (56213)  Complete Medication List: 1)  Plavix 75 Mg Tabs (Clopidogrel bisulfate) .... Take one tablet daily 2)  Albuterol 90 Mcg/act Aers (Albuterol) .... Inhale 2 puff using inhaler four times a day 3)  Buspirone Hcl 15 Mg Tabs (Buspirone hcl) .... Take 1 tablet by mouth twice a day 4)  Ecotrin Low Strength 81 Mg Tbec (Aspirin) .... Take 1 tablet by mouth once a day 5)  Metoprolol Succinate 50 Mg Tb24 (Metoprolol succinate) .... Take 1 tablet by mouth once a day 6)  Simvastatin 40 Mg Tabs (Simvastatin) .... One daily 7)  Vitamin B-6 100 Mg Tabs (Pyridoxine hcl) .... Take one tablet daily 8)  Allopurinol 100 Mg Tabs (Allopurinol) .Marland Kitchen.. 1 daily 9)  Omeprazole 20 Mg Cpdr (Omeprazole) .... Take one tab two times a day 10)  Tramadol-acetaminophen 37.5-325 Mg Tabs (Tramadol-acetaminophen) .... Take one tablet every 6 hours as needed pain. 11)  Nifedipine 60 Mg Xr24h-tab (Nifedipine) .... Take one tablet daily 12)  Diabetic Shoes  .... Thick nails, callouses 13)  Triamcinolone Acetonide 0.1 % Oint (Triamcinolone acetonide) .... Use 1-2 times a day for leg rash 14)  Calcitriol 0.5 Mcg Caps (Calcitriol) .... Take one tab 3 times weekly 15)  Proair Hfa 108 (90 Base) Mcg/act Aers  (Albuterol sulfate) .... 2 puffs qid as needed 16)  Flonase 50 Mcg/act Susp (Fluticasone propionate) .... Two squirts in each nostril daily 17)  Polyethylene Glycol 3350 Powd (Polyethylene glycol 3350) .Marland KitchenMarland Kitchen. 17 gm in 4 oz of liquid daily, qs for one month 18)  Loperamide Hcl 2 Mg Caps (Loperamide hcl) .... Take 3 then one after each loose bowel movement 19)  Cetirizine Hcl 10 Mg Tabs (Cetirizine hcl) .... Otc one qhs 20)  Isosorbide Mononitrate Cr 30 Mg Xr24h-tab (Isosorbide mononitrate) .... One q morning 21)  Nitrostat 0.4 Mg Subl (Nitroglycerin) .... One under tongue as needed for chest pain, may repeat in 5 minutes twice but if still having problems access emergency care, 1 bottle 22)  Bayer Contour Test Strp (Glucose blood) .... Test fasting and  when symptoms of low blood sugar 23)  Micatin 2 % Crea (Miconazole nitrate) .... Apply two times a day  Patient Instructions: 1)  Please schedule a follow-up appointment in 1 month.  Prescriptions: NIFEDIPINE 60 MG XR24H-TAB (NIFEDIPINE) Take one tablet daily  #30 x 6   Entered and Authorized by:   Zachery Dauer MD   Signed by:   Zachery Dauer MD on 08/16/2010   Method used:   Electronically to        [facility] (retail)       [address]       [place], [place]  [id number]       Ph: [phone]       Fax: [phone]   RxID:   [id number] TRAMADOL-ACETAMINOPHEN 37.5-325 MG TABS (TRAMADOL-ACETAMINOPHEN) Take one tablet every 6 hours as needed pain.  #90 x 3   Entered and Authorized by:   Zachery Dauer MD   Signed by:   Zachery Dauer MD on 08/16/2010   Method used:   Electronically to        [facility] (retail)       [address]       [place], [place]  [id number]       Ph: [phone]       Fax: [phone]   RxID:   [id number]     Prevention & Chronic Care Immunizations   Influenza vaccine: Fluvax MCR  (09/09/2009)   Influenza vaccine due: 08/04/2010    Tetanus booster:  01/04/2005: Done.   Tetanus booster due: 01/04/2015    Pneumococcal vaccine: Done.  (08/05/1999)   Pneumococcal vaccine deferral: Not indicated  (10/19/2009)   Pneumococcal vaccine due: None    H. zoster vaccine: 10/04/2009: Zostavax  Colorectal Screening   Hemoccult: Done.  (02/01/2002)   Hemoccult due: Not Indicated    Colonoscopy: Done.  (02/01/2002)   Colonoscopy due: 02/02/2012  Other Screening   Pap smear: Done.  (01/04/2006)   Pap smear due: Not Indicated    Mammogram: Assessment: BIRADS 4. Location: [facility].    (05/12/2010)   Mammogram action/deferral: Recheck in 6 mos. to consider biopsy for low risk microcalcifications  (05/12/2010)   Mammogram due: 09/24/2009    DXA bone density scan: Done.  (10/04/2000)   DXA bone density action/deferral: Ordered  (07/29/2009)   DXA scan due: None    Smoking status: never  (08/16/2010)  Diabetes Mellitus   HgbA1C: 5.9  (05/31/2010)   Hemoglobin A1C due: Not Indicated    Eye exam: status post laser  (06/14/2010)   Eye exam due: 07/09/2009    Foot exam: yes  (07/29/2009)   High risk foot: Not documented   Foot care education: Not documented   Foot exam due: 07/14/2009    Urine microalbumin/creatinine ratio: Not documented   Urine microalbumin/cr due: Not Indicated    Diabetes flowsheet reviewed?: Yes   Progress toward A1C goal: At goal  Lipids   Total Cholesterol: 142  (03/10/2010)   LDL: 72  (03/10/2010)   LDL Direct: 88  (07/28/2008)   HDL: 35  (03/10/2010)   Triglycerides: 177  (03/10/2010)    SGOT (AST): 28  (07/28/2010)   SGPT (ALT): 17  (07/28/2010)   Alkaline phosphatase: 84  (07/28/2010)   Total bilirubin: 0.4  (07/28/2010)    Lipid flowsheet reviewed?: Yes   Progress toward LDL goal: At goal  Hypertension   Last Blood Pressure: 152 / 77  (08/16/2010)  Serum creatinine: 2.52  (07/28/2010)   Serum potassium 5.0  (07/28/2010)    Hypertension flowsheet reviewed?:  Yes   Progress toward BP goal: Deteriorated   Hypertension comments: repeat improved  Self-Management Support :   Personal Goals (by the next clinic visit) :     Personal A1C goal: 7  (07/29/2009)     Personal blood pressure goal: 130/80  (07/29/2009)     Personal LDL goal: 100  (07/29/2009)    Diabetes self-management support: Education handout  (07/29/2009)    Hypertension self-management support: Education handout  (07/29/2009)    Lipid self-management support: Not documented

## 2011-01-05 NOTE — Assessment & Plan Note (Signed)
Summary: f/ eo   Vital Signs:  Patient profile:   77 year old female Menstrual status:  hysterectomy Weight:      145 pounds Pulse rate:   69 / minute BP sitting:   112 / 68  (left arm) Cuff size:   regular  Vitals Entered By: Arlyss Repress CMA, (May 12, 2010 10:56 AM) CC: f/up DM. kidney problems. and discuss Januvia. unable to urinate on Saturdays...several weeks. Is Patient Diabetic? Yes Pain Assessment Patient in pain? yes     Location: legs Intensity: 6 Onset of pain  Chronic   Primary Care Provider:  Zachery Dauer MD  CC:  f/up DM. kidney problems. and discuss Januvia. unable to urinate on Saturdays...several weeks.Marland Kitchen  History of Present Illness: Saw Dr. Caryn Section today, he ordered an I&O cath procedure for culture.  She is schedluled for a renal US next week.  She contiues to complain of her right side hurting and having days that she does not void at all.  Her furosemide was reduced to as needed as creatitine improved when taking less frequent.  Potassium is increasing off the furosemide last at 5.1  Legs hurting and keeping her awake at night.  Has been visiting the chiopracter regularly, using heat, and taking tramadol as needed.  She understands that it is her arthritis.  She has been taking 2 cholchicine tabs daily.   Bowels are not acting up, not constipated or having diarrhea.  She is not eating much, just not hungry.    Habits & Providers  Alcohol-Tobacco-Diet     Tobacco Status: never  Current Medications (verified): 1)  Plavix 75 Mg Tabs (Clopidogrel Bisulfate) .... Take One Tablet Daily 2)  Albuterol 90 Mcg/act Aers (Albuterol) .... Inhale 2 Puff Using Inhaler Four Times A Day 3)  Buspirone Hcl 15 Mg Tabs (Buspirone Hcl) .... Take 1 Tablet By Mouth Twice A Day 4)  Ecotrin Low Strength 81 Mg Tbec (Aspirin) .... Take 1 Tablet By Mouth Once A Day 5)  Florastor  (Saccharomyces Boulardii) Caps .... Take 1 Packet By Mouth Twice A Day 6)  Metoprolol Succinate 50 Mg  Tb24 (Metoprolol Succinate) .... Take 1 Tablet By Mouth Once A Day 7)  Simvastatin 40 Mg  Tabs (Simvastatin) .... One Daily 8)  Lasix 20 Mg Tabs (Furosemide) .Marland Kitchen.. 1-2 As Needed For No Void 9)  Vitamin B-6 100 Mg Tabs (Pyridoxine Hcl) .... Take One Tablet Daily 10)  Allopurinol 100 Mg  Tabs (Allopurinol) .... 1.5 Daily 11)  Omeprazole 20 Mg Cpdr (Omeprazole) .... Take One Tab Two Times A Day 12)  Tramadol-Acetaminophen 37.5-325 Mg Tabs (Tramadol-Acetaminophen) .... Take One Tablet Every 6 Hours As Needed Pain. 13)  Glyburide 5 Mg Tabs (Glyburide) .... Take 1/4  Tab One Times A Day 14)  Nifedipine 60 Mg Xr24h-Tab (Nifedipine) .... Take One Tablet Daily 15)  Diabetic Shoes .... Thick Nails, Callouses 16)  Januvia 100 Mg Tabs (Sitagliptin Phosphate) .... Take 1/4 Tab Daily 17)  Triamcinolone Acetonide 0.1 % Oint (Triamcinolone Acetonide) .... Use 1-2 Times A Day For Leg Rash 18)  Calcitriol 0.5 Mcg Caps (Calcitriol) .... Take One Tab 3 Times Weekly 19)  Colcrys 0.6 Mg Tabs (Colchicine) .... Take One Tablet Daily As Directed 20)  Proair Hfa 108 (90 Base) Mcg/act Aers (Albuterol Sulfate) .... 2 Puffs Qid As Needed 21)  Flonase 50 Mcg/act Susp (Fluticasone Propionate) .... Two Squirts in Each Nostril Daily 22)  Polyethylene Glycol 3350  Powd (Polyethylene Glycol 3350) .Marland KitchenMarland KitchenMarland Kitchen 17 Gm  in 4 Oz of Liquid Daily, Qs For One Month 23)  Loperamide Hcl 2 Mg Caps (Loperamide Hcl) .... Take 3 Then One After Each Loose Bowel Movement 24)  Cetirizine Hcl 10 Mg Tabs (Cetirizine Hcl) .... Otc One Qhs 25)  Isosorbide Mononitrate Cr 30 Mg Xr24h-Tab (Isosorbide Mononitrate) .... One Q Morning 26)  Nitrostat 0.4 Mg Subl (Nitroglycerin) .... One Under Tongue As Needed For Chest Pain, May Repeat in 5 Minutes Twice But If Still Having Problems Access [facility], 1 Bottle  Allergies (verified): 1)  ! Novocain 2)  ! Doxycycline Hyclate (Doxycycline Hyclate) 3)  * Clonidine 4)  Codeine 5)  Benadryl 6)  * Tavist 7)   Cipro 8)  Neurontin 9)  Septra Ds (Sulfamethoxazole-Trimethoprim)  Review of Systems      See HPI General:  Denies chills, fever, malaise, and sweats. MS:  Complains of low back pain.  Physical Exam  General:  Looked well Lungs:  normal respiratory effort, normal breath sounds, and no crackles.   Heart:  normal rate and regular rhythm.   Abdomen:  soft.  + cva tenderness Psych:  slightly anxious.     Impression & Recommendations:  Problem # 1:  OLIGURIA (ICD-788.5) Working up.  Renal US schedluled for next week.  Urine sent for culture; has been cultured several times in past months, all have been cathed specimins and they have never grown out any organisms.  Uses furosemide for no void in 24 hours. Orders: Urinalysis-[facility] (00000) Urine Culture-[facility] ([id number]) [facility]- Est Level  3 (56213)  Problem # 2:  PSEUDOGOUT (ICD-275.49)  Reduce colchicine to one tab daily  Orders: [facility]- Est Level  3 (08657)  Problem # 3:  DIABETES MELLITUS, II, COMPLICATIONS (ICD-250.92)  denies hypoglycemia Her updated medication list for this problem includes:    Ecotrin Low Strength 81 Mg Tbec (Aspirin) .Marland Kitchen... Take 1 tablet by mouth once a day    Glyburide 5 Mg Tabs (Glyburide) .Marland Kitchen... Take 1/4  tab one times a day    Januvia 100 Mg Tabs (Sitagliptin phosphate) .Marland Kitchen... Take 1/4 tab daily  Orders: [facility]- Est Level  3 (84696)  Problem # 4:  LUMBAR SPINAL STENOSIS (ICD-724.02) Likely cause of leg and back pain.  Using tramadol carefully. Orders: [facility]- Est Level  3 (29528)  Complete Medication List: 1)  Plavix 75 Mg Tabs (Clopidogrel bisulfate) .... Take one tablet daily 2)  Albuterol 90 Mcg/act Aers (Albuterol) .... Inhale 2 puff using inhaler four times a day 3)  Buspirone Hcl 15 Mg Tabs (Buspirone hcl) .... Take 1 tablet by mouth twice a day 4)  Ecotrin Low Strength 81 Mg Tbec (Aspirin) .... Take 1 tablet by mouth once a day 5)  Florastor (saccharomyces Boulardii) Caps  .... Take 1 packet by mouth  twice a day 6)  Metoprolol Succinate 50 Mg Tb24 (Metoprolol succinate) .... Take 1 tablet by mouth once a day 7)  Simvastatin 40 Mg Tabs (Simvastatin) .... One daily 8)  Lasix 20 Mg Tabs (Furosemide) .Marland Kitchen.. 1-2 as needed for no void 9)  Vitamin B-6 100 Mg Tabs (Pyridoxine hcl) .... Take one tablet daily 10)  Allopurinol 100 Mg Tabs (Allopurinol) .... 1.5 daily 11)  Omeprazole 20 Mg Cpdr (Omeprazole) .... Take one tab two times a day 12)  Tramadol-acetaminophen 37.5-325 Mg Tabs (Tramadol-acetaminophen) .... Take one tablet every 6 hours as needed pain. 13)  Glyburide 5 Mg Tabs (Glyburide) .... Take 1/4  tab one times a day 14)  Nifedipine 60 Mg Xr24h-tab (  Nifedipine) .... Take one tablet daily 15)  Diabetic Shoes  .... Thick nails, callouses 16)  Januvia 100 Mg Tabs (Sitagliptin phosphate) .... Take 1/4 tab daily 17)  Triamcinolone Acetonide 0.1 % Oint (Triamcinolone acetonide) .... Use 1-2 times a day for leg rash 18)  Calcitriol 0.5 Mcg Caps (Calcitriol) .... Take one tab 3 times weekly 19)  Colcrys 0.6 Mg Tabs (Colchicine) .... Take one tablet daily as directed 20)  Proair Hfa 108 (90 Base) Mcg/act Aers (Albuterol sulfate) .... 2 puffs qid as needed 21)  Flonase 50 Mcg/act Susp (Fluticasone propionate) .... Two squirts in each nostril daily 22)  Polyethylene Glycol 3350 Powd (Polyethylene glycol 3350) .Marland KitchenMarland Kitchen. 17 gm in 4 oz of liquid daily, qs for one month 23)  Loperamide Hcl 2 Mg Caps (Loperamide hcl) .... Take 3 then one after each loose bowel movement 24)  Cetirizine Hcl 10 Mg Tabs (Cetirizine hcl) .... Otc one qhs 25)  Isosorbide Mononitrate Cr 30 Mg Xr24h-tab (Isosorbide mononitrate) .... One q morning 26)  Nitrostat 0.4 Mg Subl (Nitroglycerin) .... One under tongue as needed for chest pain, may repeat in 5 minutes twice but if still having problems access emergency care, 1 bottle  Patient Instructions: 1)  Please schedule a follow-up appointment in 1 month.  Prescriptions: JANUVIA 100 MG  TABS (SITAGLIPTIN PHOSPHATE) Take 1/4 tab daily  #30 x 3   Entered by:   Luretha Murphy NP   Authorized by:   Zachery Dauer MD   Signed by:   Luretha Murphy NP on 05/12/2010   Method used:   Electronically to        [facility] (retail)       [address]       [place], [place]  [address]       Ph: [phone]       Fax: [phone]   RxID:   [id number]    Prevention & Chronic Care Immunizations   Influenza vaccine: Fluvax MCR  (09/09/2009)   Influenza vaccine due: 08/04/2010    Tetanus booster: 01/04/2005: Done.   Tetanus booster due: 01/04/2015    Pneumococcal vaccine: Done.  (08/05/1999)   Pneumococcal vaccine deferral: Not indicated  (10/19/2009)   Pneumococcal vaccine due: None    H. zoster vaccine: 10/04/2009: Zostavax  Colorectal Screening   Hemoccult: Done.  (02/01/2002)   Hemoccult due: Not Indicated    Colonoscopy: Done.  (02/01/2002)   Colonoscopy due: 02/02/2012  Other Screening   Pap smear: Done.  (01/04/2006)   Pap smear due: Not Indicated    Mammogram: normal  (09/24/2008)   Mammogram due: 09/24/2009    DXA bone density scan: Done.  (10/04/2000)   DXA bone density action/deferral: Ordered  (07/29/2009)   DXA scan due: None    Smoking status: never  (05/12/2010)  Diabetes Mellitus   HgbA1C: 5.7  (02/17/2010)   Hemoglobin A1C due: Not Indicated    Eye exam: diabetic retinopathy  (07/09/2008)   Eye exam due: 07/09/2009    Foot exam: yes  (07/29/2009)   High risk foot: Not documented   Foot care education: Not documented   Foot exam due: 07/14/2009    Urine microalbumin/creatinine ratio: Not documented   Urine microalbumin/cr due: Not Indicated    Diabetes flowsheet reviewed?: Yes   Progress toward A1C goal: At goal  Lipids   Total Cholesterol: 142  (03/10/2010)   LDL: 72  (03/10/2010)   LDL Direct: 88  (07/28/2008)   HDL: 35  (03/10/2010)  Triglycerides: 177  (03/10/2010)    SGOT (AST): 40  (02/07/2010)    SGPT (ALT): 22  (02/07/2010)   Alkaline phosphatase: 82  (02/07/2010)   Total bilirubin: 0.4  (02/07/2010)    Lipid flowsheet reviewed?: Yes   Progress toward LDL goal: At goal  Hypertension   Last Blood Pressure: 112 / 68  (05/12/2010)   Serum creatinine: 2.67  (05/12/2010)   Serum potassium 5.1  (05/12/2010)    Hypertension flowsheet reviewed?: Yes   Progress toward BP goal: At goal  Self-Management Support :   Personal Goals (by the next clinic visit) :     Personal A1C goal: 7  (07/29/2009)     Personal blood pressure goal: 130/80  (07/29/2009)     Personal LDL goal: 100  (07/29/2009)    Diabetes self-management support: Education handout  (07/29/2009)    Hypertension self-management support: Education handout  (07/29/2009)    Lipid self-management support: Not documented     Appended Document: urine    Lab Visit  Laboratory Results   Urine Tests  Date/Time Received: May 12, 2010 11:37 AM  Date/Time Reported: May 12, 2010 2:46 PM   Routine Urinalysis   Color: yellow Appearance: Clear Glucose: negative   (Normal Range: Negative) Bilirubin: negative   (Normal Range: Negative) Ketone: negative   (Normal Range: Negative) Spec. Gravity: 1.020   (Normal Range: 1.003-1.035) Blood: negative   (Normal Range: Negative) pH: 5.0   (Normal Range: 5.0-8.0) Protein: 100   (Normal Range: Negative) Urobilinogen: 0.2   (Normal Range: 0-1) Nitrite: negative   (Normal Range: Negative) Leukocyte Esterace: negative   (Normal Range: Negative)  Urine Microscopic WBC/HPF: rare RBC/HPF: rare Bacteria/HPF: 3+ cocci Epithelial/HPF: rare Casts/LPF: 5-10 hyaline;   1-5 granulalr    Comments: cath urine;   urine cultured ...............test performed by......Marland KitchenBonnie A. [place], MLS (ASCP)cm    Orders Today: Insertion of Non-indwelling cath- [facility] [[id number]]

## 2011-01-05 NOTE — Assessment & Plan Note (Signed)
Summary: f/u visit/bmc   Vital Signs:  Patient profile:   77 year old female Menstrual status:  hysterectomy Height:      59.75 inches Weight:      146 pounds BMI:     28.86 Pulse rate:   83 / minute BP sitting:   138 / 73  (right arm)  Vitals Entered By: Arlyss Repress CMA, (November 15, 2010 1:43 PM) CC: refill meds. discuss omeprazole..does not work any more. leg pain. Is Patient Diabetic? Yes Pain Assessment Patient in pain? yes     Location: legs Intensity: 7   Primary Care Provider:  Zachery Dauer MD  CC:  refill meds. discuss omeprazole..does not work any more. leg pain.Marland Kitchen  History of Present Illness: Larey Seat  twice a couple weeks ago, against floor and doorway, not to ground. Unsteady gait. Not lightheaded. Does have some palpitations. Hasn't been taking a pill and doesn't know what happened to it. Metoprolol is the only one not in her bag.   right leg has been very sore, lateral knee and medial thigh and the lower leg is more swollen left side. Doesn't think it was injured in the falls. Dr Farris Has had injected the medial right knee with steroid 2 weeks ago and it is still bruised there. Has a cane, but not using it regularly. He ordered an ultrasound test that she had an hour ago.  No chest pain but dyspnea on exertion. No PND. Dr Amil Amen leaving the end of this month and she will see a new cardiologist mid Jan.   DM - fasting capillary blood glucose was 146. No hypoglycemia symptoms .  Renal - Dr Caryn Section didn't change meds. Labs he drew are entered.   Despite taking Omeprazole two times a day she still has reflux. Makes her throat sore and causes hoarseness. Using One shot Cepacol. Continuously nauseated, but makes her self eat.   Habits & Providers  Alcohol-Tobacco-Diet     Tobacco Status: never  Current Medications (verified): 1)  Plavix 75 Mg Tabs (Clopidogrel Bisulfate) .... Take One Tablet Daily 2)  Albuterol 90 Mcg/act Aers (Albuterol) .... Inhale 2 Puff Using  Inhaler Four Times A Day 3)  Buspirone Hcl 15 Mg Tabs (Buspirone Hcl) .... Take 1 Tablet By Mouth Twice A Day 4)  Ecotrin Low Strength 81 Mg Tbec (Aspirin) .... Take 1 Tablet By Mouth Once A Day 5)  Metoprolol Succinate 50 Mg Tb24 (Metoprolol Succinate) .... Take 1 Tablet By Mouth Once A Day 6)  Simvastatin 40 Mg  Tabs (Simvastatin) .... One Daily 7)  Vitamin B-6 100 Mg Tabs (Pyridoxine Hcl) .... Take One Tablet Daily 8)  Allopurinol 100 Mg  Tabs (Allopurinol) .Marland Kitchen.. 1 Daily 9)  Omeprazole 20 Mg Cpdr (Omeprazole) .... Take One Tab Two Times A Day 10)  Tramadol-Acetaminophen 37.5-325 Mg Tabs (Tramadol-Acetaminophen) .... Take One Tablet Every 6 Hours As Needed Pain. 11)  Nifedipine 60 Mg Xr24h-Tab (Nifedipine) .... Take One Tablet Daily 12)  Diabetic Shoes .... Thick Nails, Callouses 13)  Triamcinolone Acetonide 0.1 % Oint (Triamcinolone Acetonide) .... Use 1-2 Times A Day For Leg Rash 14)  Calcitriol 0.5 Mcg Caps (Calcitriol) .... Take One Tab 3 Times Weekly 15)  Proair Hfa 108 (90 Base) Mcg/act Aers (Albuterol Sulfate) .... 2 Puffs Qid As Needed 16)  Flonase 50 Mcg/act Susp (Fluticasone Propionate) .... Two Squirts in Each Nostril Daily 17)  Polyethylene Glycol 3350  Powd (Polyethylene Glycol 3350) .Marland KitchenMarland KitchenMarland Kitchen 17 Gm in 4 Oz of Liquid Daily, Qs For One  Month 18)  Loperamide Hcl 2 Mg Caps (Loperamide Hcl) .... Take 3 Then One After Each Loose Bowel Movement 19)  Cetirizine Hcl 10 Mg Tabs (Cetirizine Hcl) .... Otc One Qhs 20)  Isosorbide Mononitrate Cr 30 Mg Xr24h-Tab (Isosorbide Mononitrate) .... One Q Morning 21)  Nitrostat 0.4 Mg Subl (Nitroglycerin) .... One Under Tongue As Needed For Chest Pain, May Repeat in 5 Minutes Twice But If Still Having Problems Access Emergency Care, 1 Bottle 22)  Bayer Contour Test  Strp (Glucose Blood) .... Test Fasting and When Symptoms of Low Blood Sugar 23)  Micatin 2 % Crea (Miconazole Nitrate) .... Apply Two Times A Day 24)  Metamucil 30.9 % Powd (Psyllium) .... One  Tbsp in Glass of Water Daily  Allergies (verified): 1)  ! Novocain 2)  ! Doxycycline Hyclate (Doxycycline Hyclate) 3)  * Clonidine 4)  Codeine 5)  Benadryl 6)  * Tavist 7)  Cipro 8)  Neurontin 9)  Septra Ds (Sulfamethoxazole-Trimethoprim)  Physical Exam  General:  Usual appearing. Not swallowing air and burping as she has often done in the past.  Mouth:  hoarse voice Neck:  No nausea, vomiting or diarrhea or edema Lungs:  normal respiratory effort, normal breath sounds, and no crackles.   Heart:  normal rate and regular rhythm.   Abdomen:  soft.  mildly tender difusely  Soft left flank bulge above surgical scar. no hepatomegaly and no splenomegaly.   Msk:  right knee mildly enlarged with contusion over the medical joint line and very tender over the lateral joint line. Extremities:  3+ right ankle and 1+ left ankle edema Neurologic:  gait very antalgic on right with report of pain in the knee. Position sense normal great toes.  Skin:  Old bruise left upper outer arm and medial joint line of right knee. Psych:  mildly anxious.    Diabetes Management Exam:    Foot Exam (with socks and/or shoes not present):       Sensory-Pinprick/Light touch:          Left medial foot (L-4): normal          Left dorsal foot (L-5): normal          Left lateral foot (S-1): normal          Right medial foot (L-4): normal          Right dorsal foot (L-5): normal          Right lateral foot (S-1): normal       Sensory-Monofilament:          Left foot: normal          Right foot: normal       Inspection:          Left foot: normal          Right foot: normal       Nails:          Left foot: fungal infection great toenail          Right foot: normal   Complete Medication List: 1)  Plavix 75 Mg Tabs (Clopidogrel bisulfate) .... Take one tablet daily 2)  Albuterol 90 Mcg/act Aers (Albuterol) .... Inhale 2 puff using inhaler four times a day 3)  Buspirone Hcl 15 Mg Tabs (Buspirone hcl) ....  Take 1 tablet by mouth twice a day 4)  Ecotrin Low Strength 81 Mg Tbec (Aspirin) .... Take 1 tablet by mouth once a day 5)  Metoprolol Succinate 50 Mg Tb24 (  Metoprolol succinate) .... Take 1 tablet by mouth once a day 6)  Simvastatin 40 Mg Tabs (Simvastatin) .... One daily 7)  Vitamin B-6 100 Mg Tabs (Pyridoxine hcl) .... Take one tablet daily 8)  Allopurinol 100 Mg Tabs (Allopurinol) .Marland Kitchen.. 1 daily 9)  Omeprazole 20 Mg Cpdr (Omeprazole) .... Take one tab two times a day 10)  Nifedipine 60 Mg Xr24h-tab (Nifedipine) .... Take one tablet daily 11)  Diabetic Shoes  .... Thick nails, callouses 12)  Triamcinolone Acetonide 0.1 % Oint (Triamcinolone acetonide) .... Use 1-2 times a day for leg rash 13)  Calcitriol 0.5 Mcg Caps (Calcitriol) .... Take one tab 3 times weekly 14)  Proair Hfa 108 (90 Base) Mcg/act Aers (Albuterol sulfate) .... 2 puffs qid as needed 15)  Flonase 50 Mcg/act Susp (Fluticasone propionate) .... Two squirts in each nostril daily 16)  Polyethylene Glycol 3350 Powd (Polyethylene glycol 3350) .Marland KitchenMarland Kitchen. 17 gm in 4 oz of liquid daily, qs for one month 17)  Loperamide Hcl 2 Mg Caps (Loperamide hcl) .... Take 3 then one after each loose bowel movement 18)  Cetirizine Hcl 10 Mg Tabs (Cetirizine hcl) .... Otc one qhs 19)  Isosorbide Mononitrate Cr 30 Mg Xr24h-tab (Isosorbide mononitrate) .... One q morning 20)  Nitrostat 0.4 Mg Subl (Nitroglycerin) .... One under tongue as needed for chest pain, may repeat in 5 minutes twice but if still having problems access emergency care, 1 bottle 21)  Bayer Contour Test Strp (Glucose blood) .... Test fasting and when symptoms of low blood sugar 22)  Micatin 2 % Crea (Miconazole nitrate) .... Apply two times a day 23)  Metamucil 30.9 % Powd (Psyllium) .... One tbsp in glass of water daily 24)  Tramadol Hcl 50 Mg Tabs (Tramadol hcl) .... Take one tab every 6 hours as needed 25)  Acetaminophen 325 Mg Tabs (Acetaminophen) .... Take 2 tabs every 6 hr as needed  pain.  Other Orders: [facility]- Est  Level 4 (65784)  Patient Instructions: 1)  Please schedule a follow-up appointment in 6 weeks .  2)  Restart the metoprolol  Prescriptions: METOPROLOL SUCCINATE 50 MG TB24 (METOPROLOL SUCCINATE) Take 1 tablet by mouth once a day  #30 Tablet x 10   Entered and Authorized by:   Zachery Dauer MD   Signed by:   Zachery Dauer MD on 11/15/2010   Method used:   Print then Give to Patient   RxID:   [id number] TRAMADOL HCL 50 MG TABS (TRAMADOL HCL) Take one tab every 6 hours as needed  #90 x 11   Entered and Authorized by:   Zachery Dauer MD   Signed by:   Zachery Dauer MD on 11/15/2010   Method used:   Print then Give to Patient   RxID:   [id number]    Orders Added: 1)  [facility]- Est  Level 4 [59563]     -  Date:  11/11/2010    NA 142    K 5.0    CREAT 2.29    GLU 204    HGB 11.8       Prevention & Chronic Care Immunizations   Influenza vaccine: Fluvax MCR  (09/13/2010)   Influenza vaccine due: 08/04/2010    Tetanus booster: 01/04/2005: Done.   Tetanus booster due: 01/04/2015    Pneumococcal vaccine: Done.  (08/05/1999)   Pneumococcal vaccine deferral: Not indicated  (10/19/2009)   Pneumococcal vaccine due: None    H. zoster vaccine: 10/04/2009: Zostavax  Colorectal Screening   Hemoccult: Done.  (  02/01/2002)   Hemoccult due: Not Indicated    Colonoscopy: Done.  (02/01/2002)   Colonoscopy due: 02/02/2012  Other Screening   Pap smear: Done.  (01/04/2006)   Pap smear due: Not Indicated    Mammogram: Assessment: BIRADS 4. Location: [facility].    (05/12/2010)   Mammogram action/deferral: Recheck in 6 mos. to consider biopsy for low risk microcalcifications  (05/12/2010)   Mammogram due: 09/24/2009    DXA bone density scan: Done.  (10/04/2000)   DXA bone density action/deferral: Ordered  (07/29/2009)   DXA scan due: None    Smoking status: never  (11/15/2010)  Diabetes Mellitus   HgbA1C: 6.7   (09/13/2010)   Hemoglobin A1C due: Not Indicated    Eye exam: status post laser  (06/14/2010)   Eye exam due: 07/09/2009    Foot exam: yes  (11/15/2010)   High risk foot: Not documented   Foot care education: Not documented   Foot exam due: 07/14/2009    Urine microalbumin/creatinine ratio: Not documented   Urine microalbumin/cr due: Not Indicated    Diabetes flowsheet reviewed?: Yes   Progress toward A1C goal: Unchanged  Lipids   Total Cholesterol: 142  (03/10/2010)   LDL: 72  (03/10/2010)   LDL Direct: 88  (07/28/2008)   HDL: 35  (03/10/2010)   Triglycerides: 177  (03/10/2010)    SGOT (AST): 28  (07/28/2010)   SGPT (ALT): 17  (07/28/2010)   Alkaline phosphatase: 84  (07/28/2010)   Total bilirubin: 0.4  (07/28/2010)  Hypertension   Last Blood Pressure: 138 / 73  (11/15/2010)   Serum creatinine: 2.29  (11/11/2010)   Serum potassium 5.0  (11/11/2010)    Hypertension flowsheet reviewed?: Yes   Progress toward BP goal: Unchanged   Hypertension comments: Metoprolol restarted  Self-Management Support :   Personal Goals (by the next clinic visit) :     Personal A1C goal: 7  (07/29/2009)     Personal blood pressure goal: 130/80  (07/29/2009)     Personal LDL goal: 100  (07/29/2009)    Diabetes self-management support: Education handout  (07/29/2009)    Hypertension self-management support: Education handout  (07/29/2009)    Lipid self-management support: Not documented     Appended Document: f/u visit/bmc     Current Medications (verified): 1)  Plavix 75 Mg Tabs (Clopidogrel Bisulfate) .... Take One Tablet Daily 2)  Proair Hfa 108 (90 Base) Mcg/act Aers (Albuterol Sulfate) .... Take 2 Puffs Q4h Prn 3)  Buspirone Hcl 15 Mg Tabs (Buspirone Hcl) .... Take 1 Tablet By Mouth Twice A Day 4)  Ecotrin Low Strength 81 Mg Tbec (Aspirin) .... Take 1 Tablet By Mouth Once A Day 5)  Metoprolol Succinate 50 Mg Tb24 (Metoprolol Succinate) .... Take 1 Tablet By Mouth Once A  Day 6)  Simvastatin 40 Mg  Tabs (Simvastatin) .... One Daily 7)  Vitamin B-6 100 Mg Tabs (Pyridoxine Hcl) .... Take One Tablet Daily 8)  Allopurinol 100 Mg  Tabs (Allopurinol) .Marland Kitchen.. 1 Daily 9)  Omeprazole 20 Mg Cpdr (Omeprazole) .... Take One Tab Two Times A Day 10)  Nifedipine 60 Mg Xr24h-Tab (Nifedipine) .... Take One Tablet Daily 11)  Triamcinolone Acetonide 0.1 % Oint (Triamcinolone Acetonide) .... Use 1-2 Times A Day For Leg Rash 12)  Calcitriol 0.5 Mcg Caps (Calcitriol) .... Take One Tab 3 Times Weekly 13)  Flonase 50 Mcg/act Susp (Fluticasone Propionate) .... Two Squirts in Each Nostril Daily 14)  Polyethylene Glycol 3350  Powd (Polyethylene Glycol 3350) .Marland KitchenMarland KitchenMarland Kitchen  17 Gm in 4 Oz of Liquid Daily, Qs For One Month 15)  Loperamide Hcl 2 Mg Caps (Loperamide Hcl) .... Take 3 Then One After Each Loose Bowel Movement 16)  Cetirizine Hcl 10 Mg Tabs (Cetirizine Hcl) .... Otc One Qhs 17)  Isosorbide Mononitrate Cr 30 Mg Xr24h-Tab (Isosorbide Mononitrate) .... One Q Morning 18)  Nitrostat 0.4 Mg Subl (Nitroglycerin) .... One Under Tongue As Needed For Chest Pain, May Repeat in 5 Minutes Twice But If Still Having Problems Access Emergency Care, 1 Bottle 19)  Bayer Contour Test  Strp (Glucose Blood) .... Test Fasting and When Symptoms of Low Blood Sugar 20)  Micatin 2 % Crea (Miconazole Nitrate) .... Apply Two Times A Day 21)  Metamucil 30.9 % Powd (Psyllium) .... One Tbsp in Glass of Water Daily 22)  Tramadol Hcl 50 Mg Tabs (Tramadol Hcl) .... Take One Tab Every 6 Hours As Needed 23)  Acetaminophen 325 Mg Tabs (Acetaminophen) .... Take 2 Tabs Every 6 Hr As Needed Pain.  Allergies (verified): 1)  ! Novocain 2)  ! Doxycycline Hyclate (Doxycycline Hyclate) 3)  * Clonidine 4)  Codeine 5)  Benadryl 6)  * Tavist 7)  Cipro 8)  Neurontin 9)  Septra Ds (Sulfamethoxazole-Trimethoprim)   Impression & Recommendations:  Problem # 1:  CHRONIC KIDNEY DISEASE STAGE III (MODERATE) (ICD-585.3) right ankle  swelling may related to venous insufficiency. The venous dopplers ordered by Dr Farris Has were neg for DVT. No other signs of volume overload.   Problem # 2:  HYPERTENSION (ICD-401.9)  Her updated medication list for this problem includes:    Metoprolol Succinate 50 Mg Tb24 (Metoprolol succinate) .Marland Kitchen... Take 1 tablet by mouth once a day    Nifedipine 60 Mg Xr24h-tab (Nifedipine) .Marland Kitchen... 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 Take 1 tablet by mouth once a day  Problem # 4:  HYPOGLYCEMIA (ICD-251.2) No recent episodes  Complete Medication List: 1)  Plavix 75 Mg Tabs (Clopidogrel bisulfate) .... Take one tablet daily 2)  Proair Hfa 108 (90 Base) Mcg/act Aers (Albuterol sulfate) .... Take 2 puffs q4h prn 3)  Buspirone Hcl 15 Mg Tabs (Buspirone hcl) .... Take 1 tablet by mouth twice a day 4)  Ecotrin Low Strength 81 Mg Tbec (Aspirin) .... Take 1 tablet by mouth once a day 5)  Metoprolol Succinate 50 Mg Tb24 (Metoprolol succinate) .... Take 1 tablet by mouth once a day 6)  Simvastatin 40 Mg Tabs (Simvastatin) .... One daily 7)  Vitamin B-6 100 Mg Tabs (Pyridoxine hcl) .... Take one tablet daily 8)  Allopurinol 100 Mg Tabs (Allopurinol) .Marland Kitchen.. 1 daily 9)  Omeprazole 20 Mg Cpdr (Omeprazole) .... Take one tab two times a day 10)  Nifedipine 60 Mg Xr24h-tab (Nifedipine) .... Take one tablet daily 11)  Triamcinolone Acetonide 0.1 % Oint (Triamcinolone acetonide) .... Use 1-2 times a day for leg rash 12)  Calcitriol 0.5 Mcg Caps (Calcitriol) .... Take one tab 3 times weekly 13)  Flonase 50 Mcg/act Susp (Fluticasone propionate) .... Two squirts in each nostril daily 14)  Polyethylene Glycol 3350 Powd (Polyethylene glycol 3350) .Marland KitchenMarland Kitchen. 17 gm in 4 oz of liquid daily, qs for one month 15)  Loperamide Hcl 2 Mg Caps (Loperamide hcl) .... Take 3 then one after  each loose bowel movement 16)  Cetirizine Hcl 10 Mg Tabs (Cetirizine hcl) .Marland KitchenMarland KitchenMarland Kitchen  Otc one qhs 17)  Isosorbide Mononitrate Cr 30 Mg Xr24h-tab (Isosorbide mononitrate) .... One q morning 18)  Nitrostat 0.4 Mg Subl (Nitroglycerin) .... One under tongue as needed for chest pain, may repeat in 5 minutes twice but if still having problems access emergency care, 1 bottle 19)  Bayer Contour Test Strp (Glucose blood) .... Test fasting and when symptoms of low blood sugar 20)  Micatin 2 % Crea (Miconazole nitrate) .... Apply two times a day 21)  Metamucil 30.9 % Powd (Psyllium) .... One tbsp in glass of water daily 22)  Tramadol Hcl 50 Mg Tabs (Tramadol hcl) .... Take one tab every 6 hours as needed 23)  Acetaminophen 325 Mg Tabs (Acetaminophen) .... Take 2 tabs every 6 hr as needed pain.        Prevention & Chronic Care Immunizations   Influenza vaccine: Fluvax MCR  (09/13/2010)   Influenza vaccine due: 08/04/2010    Tetanus booster: 01/04/2005: Done.   Tetanus booster due: 01/04/2015    Pneumococcal vaccine: Done.  (08/05/1999)   Pneumococcal vaccine deferral: Not indicated  (10/19/2009)   Pneumococcal vaccine due: None    H. zoster vaccine: 10/04/2009: Zostavax  Colorectal Screening   Hemoccult: Done.  (02/01/2002)   Hemoccult due: Not Indicated    Colonoscopy: Done.  (02/01/2002)   Colonoscopy due: 02/02/2012  Other Screening   Pap smear: Done.  (01/04/2006)   Pap smear due: Not Indicated    Mammogram: Assessment: BIRADS 4. Location: [facility].    (05/12/2010)   Mammogram action/deferral: Recheck in 6 mos. to consider biopsy for low risk microcalcifications  (05/12/2010)   Mammogram due: 09/24/2009    DXA bone density scan: Done.  (10/04/2000)   DXA bone density action/deferral: Ordered  (07/29/2009)   DXA scan due: None    Smoking status: never  (11/15/2010)  Diabetes Mellitus   HgbA1C: 6.7  (09/13/2010)   Hemoglobin A1C due: Not  Indicated    Eye exam: status post laser  (06/14/2010)   Eye exam due: 07/09/2009    Foot exam: yes  (11/15/2010)   High risk foot: Not documented   Foot care education: Not documented   Foot exam due: 07/14/2009    Urine microalbumin/creatinine ratio: Not documented   Urine microalbumin/cr due: Not Indicated  Lipids   Total Cholesterol: 142  (03/10/2010)   LDL: 72  (03/10/2010)   LDL Direct: 88  (07/28/2008)   HDL: 35  (03/10/2010)   Triglycerides: 177  (03/10/2010)    SGOT (AST): 28  (07/28/2010)   SGPT (ALT): 17  (07/28/2010)   Alkaline phosphatase: 84  (07/28/2010)   Total bilirubin: 0.4  (07/28/2010)  Hypertension   Last Blood Pressure: 138 / 73  (11/15/2010)   Serum creatinine: 2.29  (11/11/2010)   Serum potassium 5.0  (11/11/2010)  Self-Management Support :   Personal Goals (by the next clinic visit) :     Personal A1C goal: 7  (07/29/2009)     Personal blood pressure goal: 130/80  (07/29/2009)     Personal LDL goal: 100  (07/29/2009)    Diabetes self-management support: Education handout  (07/29/2009)    Hypertension self-management support: Education handout  (07/29/2009)    Lipid self-management support: Not documented

## 2011-01-05 NOTE — Progress Notes (Signed)
Summary: Triage  Phone Note Call from Patient Call back at Home Phone [phone]   Reason for Call: Talk to Nurse Summary of Call: pt having dizzy spells, chills and thinks she is having problems with her kidneys, would like to be seen by Dr. Sheffield Slider tomorrow. Initial call taken by: Knox Royalty,  December 06, 2009 8:55 AM  Follow-up for Phone Call        Since last Thursday has been experiencing dizziness, chills and diaphoresis.  No fever.  Blood sugar has ranged from 95 to 239.  She thinks she may have a kidney infection (frequency and back ache).  Attempted to get her in to see the [place] doctor today but pt. insists that she only see Dr. Sheffield Slider or Luretha Murphy.  Made her an appt for Thurs in [place] [facility] but told her if sx get worse she is to call for a quicker appt.  Pt agreeable.   Follow-up by: Dennison Nancy RN,  December 06, 2009 9:27 AM

## 2011-01-06 ENCOUNTER — Encounter: Payer: Self-pay | Admitting: Family Medicine

## 2011-01-11 NOTE — Miscellaneous (Signed)
Summary: EGD result  Clinical Lists Changes  Observations: Added new observation of PAST MED HX: EGD antral polyp benign - 03/04/2002 diabetic reinopathy CT -Lumbar Spine-, CT pelvis-negative - 01/01/2001 spinal stenosis L4,5 L3,4 - 01/01/2001 colonoscopy int hem - 03/04/2002  MRI of back spinal stenosis - 05/21/2003  CT - Abdominal & pelvic - 02/02/2004 CT sinuses neg Dr Christella Hartigan - 02/09/2004 L paraspinal hernia post surgery per CT CT - Abdominal-hernia - 06/19/2005 hypoglycemia on glyburide  GI camera pill study negative - 01/30/2006 bifascicular block-EKG 5-HIAA normal - 01/30/2006 audiogram - bilat sens/neuro loss - 10/14/2006 CT - Abd&pelvic -   - 06/09/2005 Cardiolite normal EF 54% - 01/30/2006 UGI endoscopy duodenal scar benign bx - 11/12/2006 PFT`s - obstructive disease - 01/30/2006 creatinine clearance 26 - 10/14/2006 PVR 17 cc - 01/08/2007 Stress cardiolite Neg EF 77% - 04/18/2000 TSH nl - 08/05/2003, ortho- dr. Farris Has at [facility] cards- Dr. Amil Amen Ba swallow, ?thyroid nodule 12/27/09  (01/06/2011 16:58) Added new observation of PAST SURG HX: Hysterectomy & BSO - endometriosis 1960's Cholecystectomy 2002 Facial Mass-biopsey 7/03 excision basal cell CA rostral gleuteal fold - 07/01/2003-dR. [facility] Turner-Derm/[facility] did resection stone obstr stented on L - 09/08/2003 Laser OS - 10/06/2003 Nephrectomy L 01/04/2004 retinal laser treatments - 10/23/2006 steriotactic biopsy right breast neg - 7/121/11  (01/06/2011 16:58)      Past Medical History:    EGD antral polyp benign - 03/04/2002    diabetic reinopathy    CT -Lumbar Spine-, CT pelvis-negative - 01/01/2001    spinal stenosis L4,5 L3,4 - 01/01/2001    colonoscopy int hem - 03/04/2002     MRI of back spinal stenosis - 05/21/2003     CT - Abdominal & pelvic - 02/02/2004    CT sinuses neg Dr Christella Hartigan - 02/09/2004    L paraspinal hernia post surgery per CT    CT - Abdominal-hernia - 06/19/2005    hypoglycemia on glyburide     GI camera  pill study negative - 01/30/2006    bifascicular block-EKG    5-HIAA normal - 01/30/2006    audiogram - bilat sens/neuro loss - 10/14/2006    CT - Abd&pelvic -   - 06/09/2005    Cardiolite normal EF 54% - 01/30/2006    UGI endoscopy duodenal scar benign bx - 11/12/2006    PFT`s - obstructive disease - 01/30/2006    creatinine clearance 26 - 10/14/2006    PVR 17 cc - 01/08/2007    Stress cardiolite Neg EF 77% - 04/18/2000 TSH nl - 08/05/2003,    ortho- dr. Farris [facility] at [facility]    cards- Dr. Amil Amen    Ba swallow, ?thyroid nodule 12/27/09  Past Surgical History:    Hysterectomy & BSO - endometriosis 1960's    Cholecystectomy 2002    Facial Mass-biopsey 7/03    excision basal cell CA rostral gleuteal fold - 07/01/2003-dR. Harrison Turner-Derm/[facility] did resection    stone obstr stented on L - 09/08/2003    Laser OS - 10/06/2003    Nephrectomy L 01/04/2004    retinal laser treatments - 10/23/2006    steriotactic biopsy right breast neg - 7/121/11

## 2011-01-11 NOTE — Consult Note (Signed)
Summary:  GI Ba Swallow ?thyroid nodule  [facility]   Imported By: De Nurse 01/06/2011 12:35:34  _____________________________________________________________________  External Attachment:    Type:   Image     Comment:   External Document

## 2011-01-12 ENCOUNTER — Encounter: Payer: Self-pay | Admitting: Family Medicine

## 2011-01-12 ENCOUNTER — Ambulatory Visit (INDEPENDENT_AMBULATORY_CARE_PROVIDER_SITE_OTHER): Payer: MEDICARE | Admitting: Family Medicine

## 2011-01-12 VITALS — BP 150/78 | HR 88 | Temp 98.4°F | Ht 59.75 in | Wt 139.0 lb

## 2011-01-12 DIAGNOSIS — J309 Allergic rhinitis, unspecified: Secondary | ICD-10-CM

## 2011-01-12 DIAGNOSIS — E118 Type 2 diabetes mellitus with unspecified complications: Secondary | ICD-10-CM

## 2011-01-12 DIAGNOSIS — K589 Irritable bowel syndrome without diarrhea: Secondary | ICD-10-CM

## 2011-01-12 DIAGNOSIS — I10 Essential (primary) hypertension: Secondary | ICD-10-CM

## 2011-01-12 DIAGNOSIS — IMO0002 Reserved for concepts with insufficient information to code with codable children: Secondary | ICD-10-CM

## 2011-01-12 DIAGNOSIS — E1165 Type 2 diabetes mellitus with hyperglycemia: Secondary | ICD-10-CM

## 2011-01-12 DIAGNOSIS — R269 Unspecified abnormalities of gait and mobility: Secondary | ICD-10-CM

## 2011-01-12 LAB — POCT GLYCOSYLATED HEMOGLOBIN (HGB A1C): Hemoglobin A1C: 6.6

## 2011-01-12 MED ORDER — METOPROLOL SUCCINATE ER 50 MG PO TB24: 50.0000 mg | ORAL_TABLET | Freq: Every day | ORAL | Status: DC

## 2011-01-12 MED ORDER — SENNOSIDES-DOCUSATE SODIUM 8.6-50 MG PO TABS: 1.0000 | ORAL_TABLET | Freq: Two times a day (BID) | ORAL | Status: DC | PRN

## 2011-01-12 NOTE — Assessment & Plan Note (Signed)
Will d/c Nifedipine, concern for orthostatic hypotension with multiple falls, will restart Beta blocker at 50mg  low dose and repeat in 1 month

## 2011-01-12 NOTE — Patient Instructions (Signed)
Next visit in 1 month  Do not take the Nifedipine Take the Metoprolol  Daily for your blood pressure If you have constipation you can take 1 of the senokot tablets We will call you with lab results if any changes are needed  Your A1C was 6.6% today this is good control for your diabetes We will set up a Ear Nose and Throat appt for you. Continue the allergy pill as needed - Zyrtec

## 2011-01-12 NOTE — Assessment & Plan Note (Signed)
H/o unsteady gait, hypotensive episodes also likley contributing Continue with cane with ambulation Told to avoid her seat lift and because she needs strengthening of her proximal muscles

## 2011-01-12 NOTE — Progress Notes (Signed)
  Subjective:    Patient ID: Belinda [name], female    DOB: [birth year], 77 y.o.   MRN: [id number]  HPI  HTN- seen last visit with multiple falls, concern for hypotension as pt has had some dizzy episodes, Nifedipine stopped, pt out of metoprolol. Previously tolerating Metoprolol without any difficulty        Fall- multiple falls over the past few months, fell once when she got caught on her lift chair, fell a few weeks ago and was on the floor for 12 hours, fell yesterday and hit her right knee and side. Feels she if off balance, no new medications, feels dizzy sometimes before falls, no LOC       DM- currently not on any anti- glycemic medications       Allergic Rhinitis- would like ENT referral, flonase does not help taking zyrtec        Constipation- history of both constipation and diarrhea, has to do some rectal stimulation at times when constipation severe, but does not want to take PEG, Miralax, or metamucil, takes Dulculax stool softner most days but has not seen any change, seen by GI       Hoarse- seen by GI told barium study was normal, she has a hiatal hernia, has some thryoid nodules   No recent illness, no fever   Review of Systems Per Above     Objective:   Physical Exam Gen- elderly female frail, NAD, vitals noted MSK- right knee +swelling/effusion, left knee- mild swelling, no warmth, normal ROM, no bruising noted, TTP inferior joint line Neuro- slow, unsteady gait- no evidence of wide based gate, uses 4 base cane for ambulation, +rhomberg, no pronator drift, normal speech, unable to stand from seated position without support GU- noted areas of basal cell resection on sacrum,no erythema, no new  lesions  Seen        Assessment & Plan:  Belinda Antis MD, resident

## 2011-01-12 NOTE — Assessment & Plan Note (Signed)
Plan for ENT referral

## 2011-01-12 NOTE — Assessment & Plan Note (Signed)
Pt has both diarrhea and Constipation with her IBS. Currently taking stool softener but complaint of more constipation at this time. Pt is reluctant to take any of the liquid formulations of PEG, states she does not tolerate them. Given Senna for episodes of severe constipation, understands not to take daily because of her intermittent diarrhea

## 2011-01-12 NOTE — Assessment & Plan Note (Signed)
A1C at goal without medications, continue to monitor

## 2011-01-13 LAB — BASIC METABOLIC PANEL
CO2: 19 mEq/L (ref 19–32)
Calcium: 9.4 mg/dL (ref 8.4–10.5)
Chloride: 110 mEq/L (ref 96–112)
Sodium: 141 mEq/L (ref 135–145)

## 2011-01-16 ENCOUNTER — Encounter: Payer: Self-pay | Admitting: Family Medicine

## 2011-01-17 ENCOUNTER — Ambulatory Visit (INDEPENDENT_AMBULATORY_CARE_PROVIDER_SITE_OTHER): Payer: MEDICARE | Admitting: Family Medicine

## 2011-01-17 ENCOUNTER — Encounter: Payer: Self-pay | Admitting: Family Medicine

## 2011-01-17 ENCOUNTER — Ambulatory Visit: Payer: Self-pay | Admitting: Family Medicine

## 2011-01-17 VITALS — BP 180/70 | HR 65 | Wt 139.0 lb

## 2011-01-17 DIAGNOSIS — IMO0002 Reserved for concepts with insufficient information to code with codable children: Secondary | ICD-10-CM

## 2011-01-17 DIAGNOSIS — S40812A Abrasion of left upper arm, initial encounter: Secondary | ICD-10-CM | POA: Insufficient documentation

## 2011-01-17 DIAGNOSIS — W19XXXA Unspecified fall, initial encounter: Secondary | ICD-10-CM

## 2011-01-17 NOTE — Patient Instructions (Signed)
I'm so sorry you fell again! Try to use that cane ALL the time to help prevent these! Try to keep that left arm clean and dry. The bandage over your arm and elbow will fall off on its own in 3-4 days. I have given you extra bandages to put on those cuts once the first one falls off.  Try to put this on, especially on the forearm cut.

## 2011-01-17 NOTE — Assessment & Plan Note (Signed)
Occurred yesterday, denies dizziness or LOC, states she remembers the fall, thinks that she just got off balance and had nothing to grab onto. Pt also had recent fall last week in which she hurt her right knee and was seen here in clinic at that time. -Superficial abrasions over left forearm and elbow. No obvious deformities. No bony tenderness. No indication for x-ray at this time. -not hypotensive at this time -Tegaderm placed over areas of abrasion to help keep area clean and dry and to prevent infection. Pt given extra to place on wound once initial dressing falls off -Pt should f/u w/in 1 month to reassess fall situation, may need to consider starting conversation about placement as pt does not appear to be safe living at home alone.

## 2011-01-17 NOTE — Progress Notes (Signed)
  Subjective:    Patient ID: Belinda [name], female    DOB: 1927/02/22, 77 y.o.   MRN: [id number]  HPI Comments: Walking from bathroom back into bedroom without 4-prong cane. No dizziness or loss of consciousness. Thinks that she just got off balance and fell forward, tried reaching for something but nothing was there, did not land hard on wrist, elbow, or shoulder, more just slide on forearms.  Neck is sore as is left arm, particularly over the areas of injury, but no bony tenderness, is able to move shoulder, elbow, and wrist w/o restrictions.   Fall The accident occurred 12 to 24 hours ago. The fall occurred while walking. She fell from a height of 3 to 5 ft. She landed on carpet. The volume of blood lost was minimal. The point of impact was the left elbow and left wrist. The pain is present in the left upper arm, left elbow, left lower arm, left shoulder, neck and back. The pain is mild. The symptoms are aggravated by extension and movement. Pertinent negatives include no fever, headaches, loss of consciousness, nausea, numbness or tingling.      Review of Systems  Constitutional: Negative for fever.  Gastrointestinal: Negative for nausea.  Neurological: Negative for tingling, loss of consciousness, numbness and headaches.       Objective:   Physical Exam  Vitals reviewed. Constitutional: She appears well-developed. No distress.  HENT:  Head: Normocephalic and atraumatic.  Neck: Normal range of motion. Neck supple.  Cardiovascular: Normal rate and regular rhythm.   Musculoskeletal: Normal range of motion.       Left shoulder: Normal.       Left elbow: She exhibits normal range of motion, no swelling, no effusion and no laceration. tenderness found.       Left wrist: She exhibits normal range of motion, no tenderness, no bony tenderness, no swelling, no deformity and no laceration.       Left forearm: She exhibits tenderness and laceration. She exhibits no bony tenderness, no swelling  and no edema.       Arms:      Left hand: Normal.  Neurological: She is alert. No cranial nerve deficit.          Assessment & Plan:

## 2011-01-18 ENCOUNTER — Other Ambulatory Visit: Payer: Self-pay | Admitting: Family Medicine

## 2011-01-18 ENCOUNTER — Telehealth: Payer: Self-pay | Admitting: Family Medicine

## 2011-01-18 NOTE — Telephone Encounter (Signed)
Will you plz fill her meds at her local pharmacy?

## 2011-01-19 NOTE — Assessment & Plan Note (Signed)
Summary: f/u,df   Allergies: 1)  ! Novocain 2)  ! Doxycycline Hyclate (Doxycycline Hyclate) 3)  * Clonidine 4)  Codeine 5)  Benadryl 6)  * Tavist 7)  Cipro 8)  Neurontin 9)  Septra Ds (Sulfamethoxazole-Trimethoprim)  Past History:  Past Medical History: Last updated: 01/06/2011 EGD antral polyp benign - 03/04/2002 diabetic reinopathy CT -Lumbar Spine-, CT pelvis-negative - 01/01/2001 spinal stenosis L4,5 L3,4 - 01/01/2001 colonoscopy int hem - 03/04/2002  MRI of back spinal stenosis - 05/21/2003  CT - Abdominal & pelvic - 02/02/2004 CT sinuses neg Dr Christella Hartigan - 02/09/2004 L paraspinal hernia post surgery per CT CT - Abdominal-hernia - 06/19/2005 hypoglycemia on glyburide  GI camera pill study negative - 01/30/2006 bifascicular block-EKG 5-HIAA normal - 01/30/2006 audiogram - bilat sens/neuro loss - 10/14/2006 CT - Abd&pelvic -   - 06/09/2005 Cardiolite normal EF 54% - 01/30/2006 UGI endoscopy duodenal scar benign bx - 11/12/2006 PFT`s - obstructive disease - 01/30/2006 creatinine clearance 26 - 10/14/2006 PVR 17 cc - 01/08/2007 Stress cardiolite Neg EF 77% - 04/18/2000 TSH nl - 08/05/2003, ortho- dr. Farris Has at [facility] cards- Dr. Amil Amen Ba swallow, ?thyroid nodule 12/27/09  Past Surgical History: Last updated: 01/06/2011 Hysterectomy & BSO - endometriosis 1960's Cholecystectomy 2002 Facial Mass-biopsey 7/03 excision basal cell CA rostral gleuteal fold - 07/01/2003-dR. [facility]-Derm/[facility] did resection stone obstr stented on L - 09/08/2003 Laser OS - 10/06/2003 Nephrectomy L 01/04/2004 retinal laser treatments - 10/23/2006 steriotactic biopsy right breast neg - 7/121/11   Complete Medication List: 1)  Plavix 75 Mg Tabs (Clopidogrel bisulfate) .... Take one tablet daily 2)  Proair Hfa 108 (90 Base) Mcg/act Aers (Albuterol sulfate) .... Take 2 puffs q4h prn 3)  Buspirone Hcl 15 Mg Tabs (Buspirone hcl) .... Take 1 tablet by mouth twice a day 4)  Ecotrin Low Strength 81 Mg  Tbec (Aspirin) .... Take 1 tablet by mouth once a day 5)  Metoprolol Succinate 50 Mg Tb24 (Metoprolol succinate) .... Take 1 tablet by mouth once a day 6)  Simvastatin 40 Mg Tabs (Simvastatin) .... One daily 7)  Vitamin B-6 100 Mg Tabs (Pyridoxine hcl) .... Take one tablet daily 8)  Allopurinol 100 Mg Tabs (Allopurinol) .Marland Kitchen.. 1 daily 9)  Nifedipine 60 Mg Xr24h-tab (Nifedipine) .... Take one tablet daily 10)  Triamcinolone Acetonide 0.1 % Oint (Triamcinolone acetonide) .... Use 1-2 times a day for leg rash 11)  Calcitriol 0.5 Mcg Caps (Calcitriol) .... Take one tab 3 times weekly 12)  Flonase 50 Mcg/act Susp (Fluticasone propionate) .... Two squirts in each nostril daily 13)  Polyethylene Glycol 3350 Powd (Polyethylene glycol 3350) .Marland KitchenMarland Kitchen. 17 gm in 4 oz of liquid daily, qs for one month 14)  Loperamide Hcl 2 Mg Caps (Loperamide hcl) .... Take 3 then one after each loose bowel movement 15)  Cetirizine Hcl 10 Mg Tabs (Cetirizine hcl) .... Otc one qhs 16)  Isosorbide Mononitrate Cr 30 Mg Xr24h-tab (Isosorbide mononitrate) .... One q morning 17)  Nitrostat 0.4 Mg Subl (Nitroglycerin) .... One under tongue as needed for chest pain, may repeat in 5 minutes twice but if still having problems access emergency care, 1 bottle 18)  Bayer Contour Test Strp (Glucose blood) .... Test fasting and when symptoms of low blood sugar 19)  Micatin 2 % Crea (Miconazole nitrate) .... Apply two times a day 20)  Metamucil 30.9 % Powd (Psyllium) .... One tbsp in glass of water daily 21)  Tramadol Hcl 50 Mg Tabs (Tramadol hcl) .... Take one tab every  6 hours as needed 22)  Acetaminophen 325 Mg Tabs (Acetaminophen) .... Take 2 tabs every 6 hr as needed pain. 23)  Protonix 40 Mg Tbec (Pantoprazole sodium) .... Take one tab daily

## 2011-01-20 NOTE — Telephone Encounter (Signed)
Pt called to let us know she was seen by Norton Pastel at ENT

## 2011-01-20 NOTE — Telephone Encounter (Signed)
Darl Pikes, could you please refill her medications. Thanks. Arlyss Repress

## 2011-01-20 NOTE — Telephone Encounter (Signed)
Called pt and lmvm to call pharmacy for her refills. Also need to find out, which ENT doctor she has seen before. We do not have an order in her chart for the referral Arlyss Repress

## 2011-01-20 NOTE — Telephone Encounter (Signed)
I spoke with Belinda [name] who says her medications are now available at the pharmacy.   She had wanted to see an ENT because the chiropracter suggested it due to her falls. She also has dry scaly ears. I recommended she apply hydrocortisone 1% cream to her ears and have US examine them when she comes Mar 9th and she agreed to this.

## 2011-01-23 ENCOUNTER — Other Ambulatory Visit: Payer: Self-pay | Admitting: Family Medicine

## 2011-01-23 ENCOUNTER — Ambulatory Visit (INDEPENDENT_AMBULATORY_CARE_PROVIDER_SITE_OTHER): Payer: MEDICARE | Admitting: Family Medicine

## 2011-01-23 DIAGNOSIS — I2510 Atherosclerotic heart disease of native coronary artery without angina pectoris: Secondary | ICD-10-CM

## 2011-01-23 DIAGNOSIS — L989 Disorder of the skin and subcutaneous tissue, unspecified: Secondary | ICD-10-CM

## 2011-01-23 MED ORDER — ISOSORBIDE MONONITRATE 30 MG PO TB24: 30.0000 mg | ORAL_TABLET | ORAL | Status: DC

## 2011-01-23 MED ORDER — ALLOPURINOL 100 MG PO TABS: 100.0000 mg | ORAL_TABLET | Freq: Every day | ORAL | Status: DC

## 2011-01-23 NOTE — Progress Notes (Signed)
Patient in office for wound check and patch  change. Has abrasion  and avulsion on left posterior forearm and elbow. Wound was checked at last office visit and Tegaderm was applied.   Today there is a foul smelling serous drainage noted on old tegadrem pad  . Patch was removed and cleaned with sterile saline. Belinda [name] came in to look at wounds and advises to replace tegaderm . No signs of infection noted. Replaced as directed . Advised patient to return if needed.

## 2011-01-24 ENCOUNTER — Telehealth: Payer: Self-pay | Admitting: Family Medicine

## 2011-01-24 DIAGNOSIS — S40812A Abrasion of left upper arm, initial encounter: Secondary | ICD-10-CM

## 2011-01-24 NOTE — Telephone Encounter (Signed)
Pt came in on 2/14 for visit.  Did not code as an injury.  E codes cannot be used as primary dxs

## 2011-01-25 NOTE — Telephone Encounter (Signed)
See office visit 2/14 for full documentation.

## 2011-01-25 NOTE — Assessment & Plan Note (Signed)
See detailed note from 2/14. Briefly, pt fell onto left arm and caused abrasion of lateral aspect of forearm and left elbow. Was cleaned and covered with Tegaderm.

## 2011-02-03 ENCOUNTER — Encounter: Payer: Self-pay | Admitting: Family Medicine

## 2011-02-03 DIAGNOSIS — I2510 Atherosclerotic heart disease of native coronary artery without angina pectoris: Secondary | ICD-10-CM

## 2011-02-03 NOTE — Assessment & Plan Note (Signed)
Reviewed letter from Dr Eldridge Dace, [facility] who saw her 01/13/11 for CAD native artery, sob, and syncope with collapse as diagnoses. No angina. Noted she was off Nifedipine. Did EKG and ordered echocardiogram. No med changes. Recheck one year.

## 2011-02-07 ENCOUNTER — Ambulatory Visit (INDEPENDENT_AMBULATORY_CARE_PROVIDER_SITE_OTHER): Payer: MEDICARE | Admitting: Family Medicine

## 2011-02-07 ENCOUNTER — Encounter: Payer: Self-pay | Admitting: Family Medicine

## 2011-02-07 VITALS — BP 118/68 | Temp 99.0°F | Wt 138.0 lb

## 2011-02-07 DIAGNOSIS — IMO0002 Reserved for concepts with insufficient information to code with codable children: Secondary | ICD-10-CM

## 2011-02-07 DIAGNOSIS — R300 Dysuria: Secondary | ICD-10-CM

## 2011-02-07 DIAGNOSIS — R269 Unspecified abnormalities of gait and mobility: Secondary | ICD-10-CM

## 2011-02-07 DIAGNOSIS — S40812A Abrasion of left upper arm, initial encounter: Secondary | ICD-10-CM

## 2011-02-07 DIAGNOSIS — N3090 Cystitis, unspecified without hematuria: Secondary | ICD-10-CM

## 2011-02-07 DIAGNOSIS — N390 Urinary tract infection, site not specified: Secondary | ICD-10-CM | POA: Insufficient documentation

## 2011-02-07 LAB — POCT URINALYSIS DIPSTICK
Bilirubin, UA: NEGATIVE
Glucose, UA: NEGATIVE

## 2011-02-07 LAB — POCT UA - MICROSCOPIC ONLY

## 2011-02-07 MED ORDER — CLOPIDOGREL BISULFATE 75 MG PO TABS: 75.0000 mg | ORAL_TABLET | Freq: Every day | ORAL | Status: DC

## 2011-02-07 MED ORDER — CEPHALEXIN 250 MG PO CAPS: 250.0000 mg | ORAL_CAPSULE | Freq: Three times a day (TID) | ORAL | Status: AC
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
## 2011-02-07 NOTE — Assessment & Plan Note (Signed)
Will treat presumptively for uti while awaiting culture. The decreased volume produced is ominous. She will see Dr Caryn Section 3/22 and have labs drawn before

## 2011-02-07 NOTE — Assessment & Plan Note (Signed)
At high risk of continued falls. Encourage ambulation with assistive device. Discouraged driving

## 2011-02-07 NOTE — Assessment & Plan Note (Signed)
Healing slowly, not infected. R elbow abrasion redressed

## 2011-02-07 NOTE — Patient Instructions (Signed)
Take cephalexin 250 mg 3 times daily for 7 days.  Call if the bladder symptoms fail to improve

## 2011-02-08 ENCOUNTER — Other Ambulatory Visit: Payer: Self-pay | Admitting: Sports Medicine

## 2011-02-08 DIAGNOSIS — M545 Low back pain: Secondary | ICD-10-CM

## 2011-02-09 LAB — URINE CULTURE: Colony Count: NO GROWTH

## 2011-02-13 ENCOUNTER — Encounter: Payer: Self-pay | Admitting: Home Health Services

## 2011-02-15 ENCOUNTER — Ambulatory Visit
Admission: RE | Admit: 2011-02-15 | Discharge: 2011-02-15 | Disposition: A | Discharge status: Home / Self Care | Payer: MEDICARE | Source: Ambulatory Visit | Attending: Sports Medicine | Admitting: Sports Medicine

## 2011-02-15 DIAGNOSIS — M545 Low back pain: Secondary | ICD-10-CM

## 2011-02-21 ENCOUNTER — Encounter: Payer: Self-pay | Admitting: Family Medicine

## 2011-02-21 NOTE — Assessment & Plan Note (Signed)
Dr  Caryn Section saw her 02/14/11 and did not change her medications. He recommends bp control 120/70 - 130/80

## 2011-02-21 NOTE — Progress Notes (Unsigned)
  Subjective:    Patient ID: Belinda [name], female    DOB: 1926/12/12, 77 y.o.   MRN: [id number]  HPIReport from visit to Dr Caryn Section 02/14/11   Review of Systems     Objective:   Physical Exam        Assessment & Plan:

## 2011-02-22 ENCOUNTER — Ambulatory Visit (INDEPENDENT_AMBULATORY_CARE_PROVIDER_SITE_OTHER): Payer: MEDICARE | Admitting: *Deleted

## 2011-02-22 NOTE — Progress Notes (Unsigned)
Patient in because she still has Tegaderm on  left forearm that was placed  a few weeks ago. She felt it would be difficult to remove .Ttegaderm was removed easily . Skin underneath appears well healed. Just one small area appears slightly   superfical . Area cleaned with saline and antibiotic ointment applied. Advised to wash gently with soap and watre twice daily . Area appears almost healed and advised should heal up in short time . If any problem to come back.

## 2011-03-16 ENCOUNTER — Encounter: Payer: Self-pay | Admitting: Family Medicine

## 2011-03-16 ENCOUNTER — Ambulatory Visit (INDEPENDENT_AMBULATORY_CARE_PROVIDER_SITE_OTHER): Payer: MEDICARE | Admitting: Family Medicine

## 2011-03-16 DIAGNOSIS — W19XXXA Unspecified fall, initial encounter: Secondary | ICD-10-CM

## 2011-03-16 DIAGNOSIS — M25519 Pain in unspecified shoulder: Secondary | ICD-10-CM

## 2011-03-16 DIAGNOSIS — M25512 Pain in left shoulder: Secondary | ICD-10-CM | POA: Insufficient documentation

## 2011-03-16 LAB — GLUCOSE, CAPILLARY
Glucose-Capillary: 82 mg/dL (ref 70–99)
Glucose-Capillary: 66 mg/dL — ABNORMAL LOW (ref 70–99)
Glucose-Capillary: 68 mg/dL — ABNORMAL LOW (ref 70–99)
Glucose-Capillary: 70 mg/dL (ref 70–99)

## 2011-03-16 LAB — POCT I-STAT 4, (NA,K, GLUC, HGB,HCT)
Glucose, Bld: 108 mg/dL — ABNORMAL HIGH (ref 70–99)
HCT: 41 % (ref 36.0–46.0)
HCT: 38 % (ref 36.0–46.0)
Hemoglobin: 13.9 g/dL (ref 12.0–15.0)
Hemoglobin: 12.9 g/dL (ref 12.0–15.0)
Potassium: 4.8 mEq/L (ref 3.5–5.1)

## 2011-03-16 NOTE — Assessment & Plan Note (Signed)
Due to instability and inability to accommodate to a sudden outside force. This is a trend. She has a walker but was not using it.  Plan to f/u with PCP in 1 week. Will continue fall workup then.
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
## 2011-03-16 NOTE — Patient Instructions (Signed)
Thank you for coming in today. I think you have a partial rotator cuff injury. I do not think you need surgery at this time.  Do the range of motion exercises.  Once your shoulder no longer hurts we will get you into physical therapy.  Take tylenol and tramadol as needed.  See Dr. Sheffield Slider in 2 weeks or sooner.

## 2011-03-16 NOTE — Assessment & Plan Note (Signed)
Due to fall. Think this is a partial rotator cuff tear. Still quite painful with resistance. Even if Belinda [name] has a full thickness tear I would not think that she would be a good surgical candidate due to her medical status.  Plan: ROM exercises now. Would like to get her into PT once her shoulder is feeling better. She has an appointment with Dr. Sheffield Slider in April 17th.  Will f/u then.  Handout on ROM exercises given.

## 2011-03-21 ENCOUNTER — Encounter: Payer: Self-pay | Admitting: Family Medicine

## 2011-03-21 ENCOUNTER — Ambulatory Visit (INDEPENDENT_AMBULATORY_CARE_PROVIDER_SITE_OTHER): Payer: MEDICARE | Admitting: Family Medicine

## 2011-03-21 VITALS — BP 176/96 | HR 78 | Wt 136.0 lb

## 2011-03-21 DIAGNOSIS — R269 Unspecified abnormalities of gait and mobility: Secondary | ICD-10-CM

## 2011-03-21 DIAGNOSIS — W19XXXA Unspecified fall, initial encounter: Secondary | ICD-10-CM

## 2011-03-21 DIAGNOSIS — I10 Essential (primary) hypertension: Secondary | ICD-10-CM

## 2011-03-21 MED ORDER — NIFEDIPINE ER OSMOTIC RELEASE 30 MG PO TB24: 30.0000 mg | ORAL_TABLET | Freq: Every day | ORAL | Status: DC

## 2011-03-21 MED ORDER — TRAMADOL HCL 50 MG PO TABS: 50.0000 mg | ORAL_TABLET | Freq: Four times a day (QID) | ORAL | Status: DC | PRN

## 2011-03-21 MED ORDER — GLUCOSE BLOOD VI STRP: ORAL_STRIP | Status: DC

## 2011-03-21 NOTE — Patient Instructions (Signed)
Gradually increase exercise with cane.  Start Nifedipine. Call if lightheaded or too much ankle swelling.   Recheck by Dr Sheffield Slider in 2 weeks.

## 2011-03-21 NOTE — Assessment & Plan Note (Signed)
See under fall

## 2011-03-21 NOTE — Assessment & Plan Note (Signed)
Her contusions are improving as is her left knee. She is at high risk for more serious injury.  I recommended ambulation only with assistive devices or human assistance. Standing up exercises. Minimizing driving due to her frailty

## 2011-03-21 NOTE — Progress Notes (Signed)
  Subjective:    Patient ID: Belinda [name], female    DOB: Nov 05, 1927, 77 y.o.   MRN: [id number]  HPI The left shoulder is better, but still can't lift it above her head. The bruises on her left flank and left leg remain painful but she is able to walk using her quad cane.  She denies any dizziness or lightheadedness with the fall episodes this has been driving very minimally.  Has been aware initially as well as having frequent urination but that has decreased she never had dysuria.  She is to not taking any pain pills last 2 days.  She feels like her ears are draining but no URI symptoms.  Her neck hurts to turn to the left. But she has no symptoms down into her arms  Saw the chiropractor who put her knee cap back in place after the fall.     Review of Systems  HENT: Positive for ear discharge. Negative for ear pain.   Respiratory: Negative for shortness of breath.   Cardiovascular: Negative for chest pain.  Genitourinary: Negative for dysuria and urgency.  Musculoskeletal: Positive for back pain.       Objective:   Physical Exam  Constitutional: She is oriented to person, place, and time. No distress.  HENT:  Right Ear: External ear normal.  Left Ear: External ear normal.  Nose: Nose normal.  Mouth/Throat: Oropharynx is clear and moist.  Cardiovascular: Normal rate, regular rhythm and normal heart sounds.   Pulmonary/Chest: Effort normal and breath sounds normal.  Abdominal:       Large posterior left waistline hernia around nephrectomy scar.   Musculoskeletal:       Normal range of motion of L knee. L shoulder can only be abducted to 110 degrees. Negative drop test.  Neurological: She is alert and oriented to person, place, and time. Coordination normal.       Has to use her arms to stand up. Wide based stance. Gait is symmetric. Normal strength on heel and toe stand, but the toe stand is painful  Skin:       Large, normally aging L flank and R lateral leg.            Assessment & Plan:

## 2011-03-21 NOTE — Assessment & Plan Note (Addendum)
Her blood pressure is remaining elevated so will restart Nifedipine that she has been on. Watch for [facility] and edema. Recheck blood pressure in two weeks

## 2011-03-24 ENCOUNTER — Ambulatory Visit: Payer: MEDICARE | Admitting: Family Medicine

## 2011-04-04 ENCOUNTER — Encounter: Payer: Self-pay | Admitting: Family Medicine

## 2011-04-04 ENCOUNTER — Ambulatory Visit (INDEPENDENT_AMBULATORY_CARE_PROVIDER_SITE_OTHER): Payer: MEDICARE | Admitting: Family Medicine

## 2011-04-04 VITALS — BP 202/76 | HR 71 | Temp 98.4°F | Ht 59.75 in | Wt 136.0 lb

## 2011-04-04 DIAGNOSIS — I10 Essential (primary) hypertension: Secondary | ICD-10-CM

## 2011-04-04 DIAGNOSIS — K219 Gastro-esophageal reflux disease without esophagitis: Secondary | ICD-10-CM

## 2011-04-04 DIAGNOSIS — G904 Autonomic dysreflexia: Secondary | ICD-10-CM

## 2011-04-04 MED ORDER — PANTOPRAZOLE SODIUM 40 MG PO TBEC: 40.0000 mg | DELAYED_RELEASE_TABLET | Freq: Every day | ORAL | Status: DC

## 2011-04-04 NOTE — Patient Instructions (Signed)
Increase Nifedical to 2 - 30 mg tabs daily.  I will call with blood test results and decide on blood pressure treatment.   Return in 2 months.

## 2011-04-04 NOTE — Progress Notes (Signed)
  Subjective:    Patient ID: Belinda [name], female    DOB: 01/09/1927, 77 y.o.   MRN: [id number]  HPI Ms. Labrador is had 2 episodes of syncope both occurring when she is at store with syncope associated with vomitin,g one occurring a few days ago and another 2 months ago. For the more recent episode, EMS was called. She reports they found a high blood pressure but normal blood glucose. She says that she was unconscious 10-15 minutes and persuaded them not to bring her to the emergency room. She has not had lightheadedness. She has not been driving. She has noted that her blood pressures continue to be elevated and also her glucoses have been high.  She has had more headaches recently and feels weak and nauseated and so doesn't eat. Has not been having chest pain but did take a nitroglycerin yesterday because she didn't feel good. She is having intermittent blurry vision in her right eye.    Review of Systems     Objective:   Physical Exam  Constitutional: She is oriented to person, place, and time. She appears well-developed and well-nourished.  Pulmonary/Chest: Effort normal and breath sounds normal.  Neurological: She is alert and oriented to person, place, and time. No cranial nerve deficit.  Skin: Skin is warm and dry.          Assessment & Plan:

## 2011-04-05 LAB — COMPREHENSIVE METABOLIC PANEL
Alkaline Phosphatase: 87 U/L (ref 39–117)
BUN: 32 mg/dL — ABNORMAL HIGH (ref 6–23)
Glucose, Bld: 137 mg/dL — ABNORMAL HIGH (ref 70–99)
Total Bilirubin: 0.3 mg/dL (ref 0.3–1.2)

## 2011-04-06 ENCOUNTER — Encounter: Payer: Self-pay | Admitting: Family Medicine

## 2011-04-07 DIAGNOSIS — E1169 Type 2 diabetes mellitus with other specified complication: Secondary | ICD-10-CM | POA: Insufficient documentation

## 2011-04-07 NOTE — Assessment & Plan Note (Signed)
Will increase Nifedical, but watch for swing to OH symptoms.

## 2011-04-07 NOTE — Assessment & Plan Note (Signed)
Currently hypotensive.

## 2011-04-07 NOTE — Assessment & Plan Note (Addendum)
Euvolemic by exam. Will check creatinine. Weight down 10 lb from December, but has been stable at 136 over last month.

## 2011-04-11 ENCOUNTER — Ambulatory Visit (INDEPENDENT_AMBULATORY_CARE_PROVIDER_SITE_OTHER): Payer: MEDICARE | Admitting: Family Medicine

## 2011-04-11 ENCOUNTER — Encounter: Payer: Self-pay | Admitting: Family Medicine

## 2011-04-11 VITALS — BP 156/72 | HR 71 | Temp 98.3°F | Wt 136.6 lb

## 2011-04-11 DIAGNOSIS — H538 Other visual disturbances: Secondary | ICD-10-CM | POA: Insufficient documentation

## 2011-04-11 DIAGNOSIS — M109 Gout, unspecified: Secondary | ICD-10-CM

## 2011-04-11 DIAGNOSIS — J45909 Unspecified asthma, uncomplicated: Secondary | ICD-10-CM

## 2011-04-11 DIAGNOSIS — I2510 Atherosclerotic heart disease of native coronary artery without angina pectoris: Secondary | ICD-10-CM

## 2011-04-11 DIAGNOSIS — F419 Anxiety disorder, unspecified: Secondary | ICD-10-CM

## 2011-04-11 DIAGNOSIS — K219 Gastro-esophageal reflux disease without esophagitis: Secondary | ICD-10-CM

## 2011-04-11 DIAGNOSIS — F411 Generalized anxiety disorder: Secondary | ICD-10-CM

## 2011-04-11 DIAGNOSIS — E1165 Type 2 diabetes mellitus with hyperglycemia: Secondary | ICD-10-CM

## 2011-04-11 LAB — POCT GLYCOSYLATED HEMOGLOBIN (HGB A1C): Hemoglobin A1C: 6.2

## 2011-04-11 MED ORDER — ALBUTEROL SULFATE HFA 108 (90 BASE) MCG/ACT IN AERS: 2.0000 | INHALATION_SPRAY | RESPIRATORY_TRACT | Status: DC | PRN

## 2011-04-11 MED ORDER — ALLOPURINOL 100 MG PO TABS: 100.0000 mg | ORAL_TABLET | Freq: Every day | ORAL | Status: DC

## 2011-04-11 MED ORDER — METOPROLOL SUCCINATE ER 50 MG PO TB24: 50.0000 mg | ORAL_TABLET | Freq: Every day | ORAL | Status: DC

## 2011-04-11 MED ORDER — BUSPIRONE HCL 15 MG PO TABS: 15.0000 mg | ORAL_TABLET | Freq: Two times a day (BID) | ORAL | Status: DC

## 2011-04-11 MED ORDER — PANTOPRAZOLE SODIUM 40 MG PO TBEC: 40.0000 mg | DELAYED_RELEASE_TABLET | Freq: Every day | ORAL | Status: DC

## 2011-04-11 MED ORDER — CLOPIDOGREL BISULFATE 75 MG PO TABS: 75.0000 mg | ORAL_TABLET | Freq: Every day | ORAL | Status: DC

## 2011-04-11 MED ORDER — ISOSORBIDE MONONITRATE 30 MG PO TB24: 30.0000 mg | ORAL_TABLET | ORAL | Status: DC

## 2011-04-11 NOTE — Assessment & Plan Note (Signed)
Treatment is such a catch 22, with inconsistent eating patterns, and propensity for lows, stage 3 CKD, felt she was safe being a little high vs pushing for tight control and thus risking hypoglycemia.

## 2011-04-11 NOTE — Assessment & Plan Note (Signed)
Patient is going straight to the eye doctors from here.  Her blood glucose was elevated at 183, but her A1C is at 6.2%.  Efforts to lower blood sugars in the recent past have resulted in hypoglycemia as she does not eat regularly.  Suspect something is going on in the eye itself.

## 2011-04-11 NOTE — Assessment & Plan Note (Signed)
On low dosage of both beta blocker and calcium channel blocker, with elevation in systolic pressure at 150 range today.  Probably safe for her with here history of syncope and vagal episodes.

## 2011-04-11 NOTE — Patient Instructions (Signed)
Your blood sugar was 183 this morning after eating fruit Your Blood pressure was 156/72  Your A1C is 6.2%  The reason that you are not on diabetes medication is that we worry more about low blood sugar.

## 2011-04-11 NOTE — Progress Notes (Addendum)
  Subjective:    Patient ID: Belinda [name], female    DOB: 06/23/1927, 77 y.o.   MRN: [id number]  HPI  Very upset because that her vision in her L eye is increasingly blurry for the past 2 days.  She correlates this with her blood sugars and her blood pressure.  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  She is going straight to the eye doctor after leaving our office.  She also has pain in her L eye that is new.  She eats inconsistently, has rapid changes in her bowels, vagal attacks with syncope.  She has been on oral diabetic agents but has been prone to hypoglycemia, her A1C is 6.2% today.   Review of Systems  Constitutional: Positive for diaphoresis, activity change, appetite change and fatigue. Negative for fever.       Spending a lot of time in bed, has sweats during the night, has very little energy in general.  Eyes: Positive for photophobia, pain and visual disturbance. Negative for discharge and itching.  Respiratory: Negative for cough and shortness of breath.   Cardiovascular: Negative for chest pain and leg swelling.  Gastrointestinal: Positive for constipation. Negative for vomiting and abdominal pain.  Neurological: Positive for light-headedness. Negative for syncope.  Psychiatric/Behavioral:       Upset about her overall health status       Objective:   Physical Exam  Constitutional: She is oriented to person, place, and time.       Weight at 146 (stable for months), pallor, wearing sunglasses, rapid pressured speech.  Eyes: Conjunctivae and EOM are normal. Pupils are equal, round, and reactive to light. Right eye exhibits no discharge. Left eye exhibits no discharge. No scleral icterus.       Able to see to fundus, no obvious abnormalities  Cardiovascular: Normal rate and regular rhythm.   Pulmonary/Chest: Effort normal and breath sounds normal.  Musculoskeletal: She exhibits no edema.  Neurological: She is alert and  oriented to person, place, and time.  Skin: Skin is warm and dry. There is pallor.          Assessment & Plan:  I saw [name] and discussed her care plan with Luretha Murphy, FNP. Zachery Dauer, MD

## 2011-04-12 ENCOUNTER — Other Ambulatory Visit: Payer: Self-pay | Admitting: Family Medicine

## 2011-04-12 ENCOUNTER — Telehealth: Payer: Self-pay | Admitting: Family Medicine

## 2011-04-12 DIAGNOSIS — I10 Essential (primary) hypertension: Secondary | ICD-10-CM

## 2011-04-12 MED ORDER — NIFEDIPINE ER OSMOTIC RELEASE 30 MG PO TB24: 30.0000 mg | ORAL_TABLET | Freq: Every day | ORAL | Status: DC

## 2011-04-12 MED ORDER — CALCITRIOL 0.5 MCG PO CAPS: 0.5000 ug | ORAL_CAPSULE | ORAL | Status: DC

## 2011-04-12 NOTE — Telephone Encounter (Signed)
Forgot to get refill for NIFEdipine (PROCARDIA XL/ADALAT-CC) 30 MG 24 hr & calcitRIOL (ROCALTROL) 0.5 MCG capsule [facility]  Please rush

## 2011-04-13 ENCOUNTER — Encounter: Payer: Self-pay | Admitting: Family Medicine

## 2011-04-13 ENCOUNTER — Ambulatory Visit (INDEPENDENT_AMBULATORY_CARE_PROVIDER_SITE_OTHER): Payer: MEDICARE | Admitting: Family Medicine

## 2011-04-13 ENCOUNTER — Telehealth (INDEPENDENT_AMBULATORY_CARE_PROVIDER_SITE_OTHER): Payer: MEDICARE | Admitting: Family Medicine

## 2011-04-13 VITALS — BP 200/70 | HR 69 | Temp 98.1°F | Ht 61.0 in | Wt 139.0 lb

## 2011-04-13 DIAGNOSIS — H538 Other visual disturbances: Secondary | ICD-10-CM

## 2011-04-13 DIAGNOSIS — R34 Anuria and oliguria: Secondary | ICD-10-CM | POA: Insufficient documentation

## 2011-04-13 DIAGNOSIS — R6883 Chills (without fever): Secondary | ICD-10-CM

## 2011-04-13 DIAGNOSIS — N3941 Urge incontinence: Secondary | ICD-10-CM

## 2011-04-13 DIAGNOSIS — W19XXXA Unspecified fall, initial encounter: Secondary | ICD-10-CM

## 2011-04-13 DIAGNOSIS — I10 Essential (primary) hypertension: Secondary | ICD-10-CM

## 2011-04-13 LAB — POCT URINALYSIS DIPSTICK
Bilirubin, UA: NEGATIVE
Glucose, UA: NEGATIVE
Spec Grav, UA: 1.02
Urobilinogen, UA: 0.2

## 2011-04-13 MED ORDER — NIFEDIPINE 60 MG (OSM) PO TB24: 60.0000 mg | ORAL_TABLET | Freq: Every day | ORAL | Status: DC

## 2011-04-13 MED ORDER — CALCITRIOL 0.5 MCG PO CAPS: 0.5000 ug | ORAL_CAPSULE | ORAL | Status: DC

## 2011-04-13 NOTE — Telephone Encounter (Signed)
Addended by: [place], Shayn Madole on: 04/13/2011 12:27 PM   Modules accepted: Orders

## 2011-04-13 NOTE — Assessment & Plan Note (Addendum)
Pt is afebrile today  But is having some chills at home. UA is equivocal. Plan to send for culture. And will see the pt on 04-18-11.  Pt is up 3 lbs since a few days ago. Plant to restart Lasix 20 mg daily. Will recheck Cr on 04-18-11.

## 2011-04-13 NOTE — Telephone Encounter (Signed)
Patient asking to speak with RN, thinks she is having problems with her kidneys, not able to urinate & wants to know if she can take a fluid pill?

## 2011-04-13 NOTE — Progress Notes (Signed)
Blurry Vision: Pt has macular edema as documented by ophthalmologist but she could not have the injection into the eye since her blood pressure was too high. She is suppose to get a shot in it every 6 weeks for macular degeneration but had been advised to get her BP down before they can do the shot.   Dysuria: Pt has been having decrease in urination. Has been going every hour at night usually but hasn't been doing that the last 1 week. Now only getting up once or twice a night and last night no urine at nighttime. She is now having chills and sweats for the last 1 week. Waking up at night with soaking wet clothes. No swelling, drinking lots of water, very little urination during. No burning or itching, no increase in frequency, some unusual smell. Pt has only 1 kidney, her last Cr was 2.3 which was improved from her prior Cr.   HTN:  Pt has a very high BP today. She had a recent change in BP meds (Nifedipine). It had been high before, then went low so she was taken off Nifedipine, now her BP is going high again. She had a SBP of 202 on 04-04-11. Pt use to be on Nifedipine 60 mg XR.   Falls: Pt has fallen 7 times since Dec 23rd, 2011. Gets weak and dizzy. Pt was concerned that her BP and glucose were too high and were making her fall. She has not fallen since Easter Sunday. She is now using a cane and has an alert necklace but has not fallen since she gets this.   ROS: neg except as noted in HPI.   PE: VS reviewed and noted.  Gen: Pt appears cold (asking for a blanket).  Gait: Pt takes smaller steps with turns but can move quite well when moving at faster speed.    Pt seen with Dr. Sheffield Slider in [facility].

## 2011-04-13 NOTE — Assessment & Plan Note (Signed)
Pt has very high BP today and it has been ranging 150-202 over the last 4-5 visits. Her Nifedipine was added back at her previous dose of 60 mg XR.  We have recommended she restart her Lasix (she has 20 mg tablets at home).  Will retest her Cr next week when seen.

## 2011-04-13 NOTE — Assessment & Plan Note (Signed)
Pt has a 4 pronged cane now and a safety call device that she wears. She does describe some unsteadiness on her feet at time but denies feeling lightheaded now. No falls since getting the cane.  Encouraged to continue using the cane.  We decided to not go up on her beta blocker because of concern for bradycardia causing orthostasis.

## 2011-04-13 NOTE — Patient Instructions (Signed)
Restart taking the Lasix 20 mg daily.  We will recheck your kidneys when you come in the 15th.  Continue to drink water.

## 2011-04-13 NOTE — Telephone Encounter (Signed)
Pt has the urge to go void but unable.  Also c/o chill and night time incontinence.  Advised her not to take a fluid pill.  Gave her an appt with [facility] for 10 am.

## 2011-04-13 NOTE — Assessment & Plan Note (Signed)
Pt needs to get her BP down before her ophthalmologist can inject the eye. Pt is very motivated to get BP down.  Recommended taking lasix to decrease BP as the patient has gained weight (3 lbs) in the last 2 days since last seen, likely water weight.

## 2011-04-15 LAB — URINE CULTURE: Colony Count: 15000

## 2011-04-18 ENCOUNTER — Ambulatory Visit (INDEPENDENT_AMBULATORY_CARE_PROVIDER_SITE_OTHER): Payer: MEDICARE | Admitting: Family Medicine

## 2011-04-18 VITALS — BP 179/79 | HR 86 | Temp 98.0°F | Ht 61.0 in | Wt 139.0 lb

## 2011-04-18 DIAGNOSIS — H353290 Exudative age-related macular degeneration, unspecified eye, stage unspecified: Secondary | ICD-10-CM

## 2011-04-18 DIAGNOSIS — E1165 Type 2 diabetes mellitus with hyperglycemia: Secondary | ICD-10-CM

## 2011-04-18 DIAGNOSIS — I10 Essential (primary) hypertension: Secondary | ICD-10-CM

## 2011-04-18 MED ORDER — FUROSEMIDE 20 MG PO TABS: 20.0000 mg | ORAL_TABLET | Freq: Every day | ORAL | Status: DC

## 2011-04-18 NOTE — Cardiovascular Report (Signed)
NAMERALYN, STLAURENT NO.:  [id number]   MEDICAL RECORD NO.:  [id number]          PATIENT TYPE:  INP   LOCATION:  6525                         FACILITY:  [facility]   PHYSICIAN:  Francisca December, M.D.  DATE OF BIRTH:  December 11, 1926   DATE OF PROCEDURE:  06/14/2007  DATE OF DISCHARGE:  06/15/2007                            CARDIAC CATHETERIZATION   PROCEDURES PERFORMED:  1. Percutaneous coronary intervention/drug-eluting stent implantation,      mid right coronary artery.  2. Percutaneous closure of right femoral artery.   INDICATIONS:  Belinda [name] is a 77 year old woman with known three-vessel  coronary disease, who approximately 2-1/2 weeks ago underwent  percutaneous revascularization of the LAD and left circumflex.  She had  three-vessel disease, renal insufficiency with chronic creatinine of  2.0, and each of her lesions were relatively focal as well.  The distal  targets for the right coronary were not satisfactory for bypass surgery.  I was concerned that her atypical anginal symptoms, which include a lot  of epigastric fullness, chest tightness and burping, may well be related  to right coronary ischemia.  Therefore, she is returned the  catheterization laboratory at this time for percutaneous treatment of  the right coronary.   PROCEDURE NOTE:  The patient was brought to the cardiac catheterization  laboratory in the fasting state.  The right groin was prepped and draped  in the usual sterile fashion.  Local anesthesia was obtained with the  infiltration of 1% lidocaine.  A 6-French catheter sheath was inserted  percutaneously into the right femoral artery utilizing an anterior  approach for a guiding J-wire.  A 6-French #4 right Judkins guiding  catheter was advanced to the ascending aorta, where the right coronary  os was engaged.  The patient received 0.75 mg/kg bolus of bivalrudin  followed by a 1 mg/kg constant infusion.  The resultant ACT was 366  seconds.   She has been on chronic aspirin and Plavix.  A 0.014-inch Luge  intracoronary guidewire was passed across the lesion, which was quite  diffuse in the mid right coronary.  Initial balloon dilatation was  performed with a 2.5//30 mm Scimed Maverick intracoronary balloon.  This  was carefully positioned and inflated to a peak pressure of 8  atmospheres for less than 1 minute.  This balloon was deflated removed  and a 2.75/32 mm Scimed Taxus intracoronary drug-eluting stent was  positioned carefully such that it covered the more distal portion of the  lesion completely.  This device was deployed at a peak pressure of 12  atmospheres for again less than 1 minute.  The stent balloon was  deflated and removed and a 2.75/12 mm Taxus intracoronary drug-eluting  stent was positioned in the more proximal portion of the lesion such  that it overlapped with the previously-placed stent, and it was deployed  at a peak pressure of 14 atmospheres for 45 seconds.  The stent balloon  was deflated and advanced into the more distal stented segment.  It was  reinflated to 14 atmospheres for less than 10 seconds.  It  was deflated  and pulled proximally into the midportion of the stented segment and  inflated again to 14 atmospheres.  Finally it was deflated and removed.  Cineangiography in orthogonal views both with and without the guidewire  in place were performed.  The guiding catheter was then removed.  A  right femoral arteriogram in the 45-degree RAO angulation via the  catheter sheath by hand injection documented adequate anatomy as far as  the puncture site for placement of the percutaneous closure device Angio-  Seal.  There were two slightly mobile filling defects at the entry site  and approximately 15 mm proximal.  They did have the appearance of  thrombus despite a therapeutic ACT of 366 seconds.  I consulted with Dr.  Delfin Edis and we elected to a remove the sheath and place a closure  device.   She was hypertensive with a systolic blood pressure of 200 as  well.  The concern being that leaving the sheath in place and allowing  the anticoagulant to resolve would result in complete thrombosis around  the sheath.  The Angio-Seal was subsequent deployed with good hemostasis  and dorsalis pedis pulse was intact at completion.  The patient was able  to urinate and received IV labetalol 20 mg, which resulted in a decrease  in her pressure to 158/85 at completion.   ANGIOGRAPHY:  As mentioned, the lesion treated was in the mid and  proximal portion of the right coronary artery.  It was diffuse and  extended over approximately 30 mm.  It was 95% stenotic at the greatest  degree of narrowing.  Following balloon dilatation and stent  implantation, there was no residual stenosis.   FINAL IMPRESSION:  1. Atherosclerotic coronary vascular disease, three-vessel.  2. Status post successful percutaneous coronary intervention/drug-      eluting stent implantation, proximal and mid right coronary.  3. Typical angina was reproduced with device insertion and balloon      inflation.  4. A total of 35 mL of contrast were utilized for this procedure.      Francisca December, M.D.  Electronically Signed     JHE/MEDQ  D:  06/14/2007  T:  06/16/2007  Job:  [id number]

## 2011-04-18 NOTE — Assessment & Plan Note (Signed)
Vision is worsening and she's anxious to have the injection. I again recommended she not drive.

## 2011-04-18 NOTE — H&P (Signed)
[name], GEST NO.:  [id number]   MEDICAL RECORD NO.:  [id number]          PATIENT TYPE:  INP   LOCATION:  2909                         FACILITY:  [facility]   PHYSICIAN:  Benn Moulder, M.D.      DATE OF BIRTH:  [birth year]   DATE OF ADMISSION:  05/22/2007  DATE OF DISCHARGE:                              HISTORY & PHYSICAL   PRIMARY CARE PHYSICIAN:  Zachery Dauer, M.D., with family practice teaching  service.   CARDIOLOGIST:  Dr. Amil Amen with [facility].   NEPHROLOGIST:  Dr. Caryn Section with [facility].   CHIEF COMPLAINT:  Chest pain, shortness of breath.   HISTORY OF PRESENT ILLNESS:  The patient is a 77 year old white female  with a history of diabetes, hypertension, hyperlipidemia and stage III  chronic kidney disease, who presented to the emergency department early  this morning with 1-day history of chest discomfort, dyspnea,  particularly with exertion, as well as overall not feeling well.  She  describes her chest pain as gas associated with burping.  Burping and  taking her Prevacid have not relieved the pain, however, and it is worse  when she was stressed out at [facility] today.  She developed pain in her  left shoulder region yesterday as well in association with the above  symptoms.  Pain, dyspnea, as mentioned above are worse with exertion and  stress, relieved with rest.  She is feeling better since the  nitroglycerin and heparin drip was started in the emergency department.  In the emergency department, her second set of point of care enzymes  were with an elevated troponin.   PAST MEDICAL HISTORY:  1. Diabetes for 15-20 years with complications of neuropathy and      retinopathy.  2. Hypertension.  3. Hyperlipidemia.  4. History of chronic kidney disease.  5. Osteoarthritis.  6. Pseudogout.  7. Insomnia.  8. Lumbar spinal stenosis.  9. Allergic rhinitis.  10.GERD.  11.Hiatal hernia.  12.Anxiety.  13.Diverticulosis.   PAST SURGICAL HISTORY:  1. Hysterectomy.  2. Appendectomy.  3. Cholecystectomy.  4. Left nephrectomy.   MEDICATIONS:  1. Albuterol MDI as needed.  2. BuSpar 50 mg p.o. b.i.d.  3. Cozaar 100 mg daily.  4. Dulcolax 5 mg p.r.n.  5. Ecotrin 81 mg p.r.n.  6. [place] Kid's packet 250 mg b.i.d.  7. Flonase 2 sprays per nostril daily.  8. Glyburide 5 mg p.o. daily.  9. Hydrochlorothiazide 25 mg p.o. daily.  10.Januvia 15 mg p.o. daily.  11.Lidoderm 5% patch once a day (700 mg per patch).  12.Loratadine 2 mg p.o. daily.  13.Toprol XL 25 mg p.o. daily.  14.Prevacid 30 mg p.o. daily.  15.Zocor 20 mg p.o. at each bedtime.  16.Pyridoxine 100 mg p.o. daily.  17.Tramadol p.r.n.   ALLERGIES:  CODEINE AND NOVOCAIN.   FAMILY HISTORY:  Father died in his late 62s of a brain tumor.  Mom died  of liver cancer.  Father also had rheumatoid arthritis and diabetes.  She has a son with asthma.   SOCIAL HISTORY:  She is  divorced x2.  She lives alone.  Her son's family  lives nearby.  She is a retired [employer] operational officer.  She never smoked or drank  alcohol.  She denies illicit drug use.  She drives and performs all  activities of daily living.   REVIEW OF SYSTEMS:  Positive headache, positive right knee pain, no calf  swelling, melena, hematochezia, constipation or rashes.   PHYSICAL EXAMINATION:  VITAL SIGNS:  Temperature 98.0, pulse 68, blood  pressure 218/70 initially, now 187/84.  Respiratory rate 18, 99% on room  air.  GENERAL:  Alert, oriented x3, in no acute distress.  HEENT:  Mucous membranes are moist.  Good dentition.  NECK:  No JVD, no carotid bruits.  LUNGS:  Clear to auscultation bilaterally.  No increased work of  breathing.  CARDIOVASCULAR:  Regular rate and rhythm.  No murmurs, rubs, or gallops.  CHEST:  Chest pain is not reproducible.  ABDOMEN:  Obese, with positive bowel sounds.  Soft.  Positive  midepigastric tenderness to palpation.  EXTREMITIES:  Trace pedal edema in right lower  extremity, 2+ dorsalis  pedis pulse bilaterally.  No calf swelling or tenderness.  Negative  Homans sign.   LABORATORY DATA:  Sodium 142, potassium 5.1, chloride 112, bicarbonate  21, BUN 43, creatinine 2.2, glucose 127.  White blood cell count 11.4,  hemoglobin 11.5, hematocrit 34.2, platelet count 198.  D-dimer elevated  at 1.12, BNP 131.  Point of care enzymes:  CK MB 2.0 to 1.8, troponin  less than 0.05 to 0.12, myoglobin 137 to 159.  Chest x-ray showed  bronchitic changes.  EKG:  Normal sinus rhythm at 66 beats per minute  with incomplete right bundle branch block.   ASSESSMENT AND PLAN:  A 77 year old female with hypertension,  hyperlipidemia, diabetes, and stage III chronic kidney disease, now with  dyspnea and chest discomfort.  1. Chest pain:  Concern for unstable angina with exertional chest      discomfort relieved with rest and nitroglycerin.  Troponin      increased on point of care enzymes.  Will cycle cardiac enzymes,      give 324 mg of aspirin, and Plavix 300 mg p.o. x1.  Will start      nitroglycerin and heparin drip.  Will start IV Lopressor.  Will      consult cardiology.  Pulmonary embolus is also on differential with      positive D-dimer.  We will check a VQ scan, and if this is low      risk, will complete workup with lower extremity Dopplers.  We will      also use Protonix intravenously while in the hospital for      gastroesophageal reflux disease is a possible etiology of chest      pain.  Will check a KUB to look for distention as she is having so      much difficulty with burping.  2. Hypertension:  Blood pressure about goal.  Cozaar was recently      increased to 100 mg daily by the primary care physician.  She has      not taken any Cozaar at this dose yet.  We will continue      hydrochlorothiazide.  Will hold Cozaar in case the patient requires      cardiac catheterization. 3. Hyperlipidemia on Zocor.  Will continue this.  Will check fasting       lipid panel.  4. Diabetes:  Continue Januvia, Glyburide.  Will start sliding  scale      insulin.  Will check hemoglobin A1c.  5. Allergic rhinitis.  Continue Flonase and loratadine.  6. Anxiety:  Continue BuSpar.  7. History of asthma:  No wheezing on examination.  Continue albuterol      metered-dose inhaler as needed.  8. Constipation:  No constipation currently.  Continue Dulcolax p.r.n.  9. Chronic kidney disease.  The patient has stage III chronic kidney      disease.  Creatinine is baseline.  Will need aggressive intravenous      fluids as well as bicarbonate protocol, if she ends up needing      cardiac catheterization.   DISPOSITION:  The patient is a full code currently.  She does have  living will, which we will encourage the family to bring.      Benn Moulder, M.D.     MR/MEDQ  D:  05/23/2007  T:  05/24/2007  Job:  [id number]

## 2011-04-18 NOTE — Procedures (Signed)
VASCULAR LAB EXAM  INDICATIONS:  Left arm pain and coldness for 3 days.  The patient had a  left brachiocephalic AV fistula placed on February 12, 2009, by Dr. Madilyn Fireman.  The patient is not currently on dialysis.   HISTORY:  Diabetes:  Yes  Cardiac:  History of coronary stents  Hypertension:  Yes, the patient only has 1 kidney.   EXAM:  Systolic pressure was obtained at the right radial artery (160  mmHg) and the left radial artery (58 mmHg).   Left radial artery pressure was obtained a second time with a manual  compression of the AV fistula.  Left radial systolic pressure with  manual compression of the fistula was 126 mmHg.   IMPRESSION:  Systolic pressure increases significantly  in the left  radial artery with manual compression of AV fistula suggests significant  fistula steal.   Dr. Myra Gianotti spoke with the patient and a followup appointment was  scheduled with Dr. Madilyn Fireman.     ___________________________________________  P. Liliane Bade, M.D.   MC/MEDQ  D:  02/22/2009  T:  02/23/2009  Job:  [id number]

## 2011-04-18 NOTE — Procedures (Signed)
CEPHALIC VEIN MAPPING   INDICATION:  Preop for AV fistula placement.   HISTORY:  Stage 3 chronic kidney disease.   EXAM:  The right cephalic vein is compressible.   Diameter measurements range from 0.23 to 0.44 cm.   The left cephalic vein is compressible.   Diameter measurements range from 0.17 to 0.43 cm.   See attached worksheet for all measurements.   IMPRESSION:  Patent bilateral cephalic veins with diameter measurements  as described above and on the attached worksheet.   ___________________________________________  P. Liliane Bade, M.D.   CH/MEDQ  D:  01/28/2009  T:  01/28/2009  Job:  [id number]

## 2011-04-18 NOTE — Assessment & Plan Note (Signed)
OFFICE VISIT   Belinda [name], Belinda [name]  DOB:  March 18, 1927                                       02/22/2009  XBMWU#:[id number]   REASON FOR VISIT:  Possible steal.   HISTORY:  This is a 77 year old female with chronic kidney disease not  yet on dialysis who underwent a left brachiocephalic arteriovenous  fistula by Dr. Madilyn Fireman on February 12, 2009.  She was sent over for  evaluation of possible steal.  She has been having pain and coolness in  her left hand.  She underwent ultrasound which shows a significant  increase in her radial artery pressure with compression of the graft,  consistent with a moderate to severe steal syndrome.  Clinically, the  patient's hand is viable.  She has motor function intact, the hand is  cool, I can palpate a radial pulse when I compress the graft, there is  no ulceration, she has normal sensation.   I spoke with the patient and told her that we have two options, number  one would be to continue with exercise therapy to see if we can improve  blood flow to her hand and if we are unable to do this she will require  ligation of her fistula.  She is going to work on her exercises and see  Dr. Madilyn Fireman this Thursday.  If she gets worse, she will contact us.   Jorge Ny, MD  Electronically Signed   VWB/MEDQ  D:  02/22/2009  T:  02/23/2009  Job:  [id number]   cc:   [name], M.D.

## 2011-04-18 NOTE — Discharge Summary (Signed)
NAMEMEOSHIA, BILLING NO.:  [id number]   MEDICAL RECORD NO.:  [id number]          PATIENT TYPE:  INP   LOCATION:  6526                         FACILITY:  [facility]   PHYSICIAN:  Johney Maine, M.D.   DATE OF BIRTH:  Oct 01, 1927   DATE OF ADMISSION:  05/22/2007  DATE OF DISCHARGE:  05/31/2007                               DISCHARGE SUMMARY   DISCHARGE DIAGNOSES:  1. Three-vessel coronary artery disease, status post stents.  2. Diabetes.  3. Hypertension.  4. Hyperlipidemia.  5. Chronic kidney disease.  6. Gastroesophageal reflux disease.  7. Constipation.  8. Osteoarthritis.  9. Anemia.   DISCHARGE MEDICATIONS:  1,  Coated aspirin 325 p.o. daily.  1. Plavix 75 mg p.o. daily.  2. Nitroglycerin p.r.n.  3. BuSpar 15 mg p.o. daily.  4. Nasonex 2 sprays each nostril daily.  5. Lidoderm patch, one patch as previously used.  6. Claritin 10 mg p.o. daily.  7. Zocor 20 mg p.o. daily.  8. Colace 100 mg 2 tablets p.o. daily p.r.n. constipation.  9. Lasix 20 mg p.o. daily.  10.K-Dur 20 mEq p.o. daily.  11.MiraLax 17 grams and 8 ounces p.o. daily.  12.Januvia 25 mg p.o. daily.  13.Toprol XL 50 mg p.o. daily.  14.Prevacid 30 mg p.o. daily.  15.__________  150 mg p.o. b.i.d.  16.Glyburide 5 mg p.o. daily.  17.Tramadol 50 mg p.o. q.6 h p.r.n. pain.  18.Albuterol 1-2 puffs q.6 h p.r.n. shortness of breath.  The patient was instructed to discontinue the previous dose of Januvia,  Toprol XL; and discontinue the using the HCTZ.   HOSPITAL FOLLOWUP:  The patient will follow up with Dr. Amil Amen' PA on  June 10, 2007 at 9:45 for labs.  He will then see the PA at 10:30 a.m.  She will also tentatively have another stent and a cardiac  catheterization with PCI placed on June 13, 2007.  However at the July 7  these appointments will be discussed.  The patient follow up at [facility].  [facility] will call the patient with a new appointment,  as she missed the one from  this week because she was hospitalized.  The  patient was instructed to call [facility] if she did not hear from  them in 1-2 days.  The patient will also follow up with [facility] A. Sheffield Slider,  M.D. at the family practice center as needed.   HOSPITAL COURSE:  This is a 77 year old female who was admitted for  chest pain similar to unstable angina.  We consulted cardiology, who  felt the patient would benefit from an elective cardiac catheterization,  not an emergent one.  Over the hospital course her chest pain was  relieved, especially after her cardiac catheterization with PCI.  She  did well over the hospital course.  She was discharged in stable and  improved condition.  Please see following details.   #1 -  CHEST PAIN:  We admitted the patient after she presented with  chest pain that was concerning for unstable angina.  The pain was worse  with  exertion and relieved with rest.  Nitroglycerin helped it.  She  also had some gas and burping with this chest pain.  We consulted  cardiology and Dr. Logan Bores was her primary cardiologist.  While the  cardiology consult was pending, we cycled cardiac enzymes and started  aspirin and Plavix, as well as nitroglycerin and heparin drip.  We also  started IV Lopressor.  We appreciate Dr. Amil Amen' consult, and after  seeing the patient he decided to perform an elective cardiac  catheterization.  Because of the patient's renal disease, we used bicarb  as well as MucoMyst and IV fluids to prevent worsened renal function.  We also ruled the patient out for pulmonary embolus,  as she had an  elevated D-dimer on views of VQ scan.  The patient tolerated the cardiac  catheterization well and was found to have three-vessel disease.  However, she only had stents placed in her LAD and left circumflex, due  to the time of the catheterization.  She will have another stent placed  in the near future.  Status post catheterization she did very well; no  longer had  any chest pain.  We continued her on beta blocker, Plavix and  aspirin.  Please note the drug-eluting stents were placed.  The patient  will follow up in the cardiology clinic.   #2 - HYPERTENSION:  The patient was admitted with blood pressures that  were near goal.  However, she continued to have problems with her blood  pressures during her hospital stay.  We had to hold her Cozaar due to  increased creatinine.  We also discontinued the hydrochlorothiazide, as  the patient's GFR indicated that HCTZ would not be very beneficial to  her.  We increased her metoprolol dose during the hospital stay, which  helped.  Initially started Lasix, which the patient will use at this  point and time.  However, her primary care Genevia Bouldin may stop this in the  future.  We basically use this because she did have some pulmonary  edema, although her echocardiogram was within normal limits.  Her  primary care Ronisha Herringshaw may go up on her Toprol XL if her heart rate  tolerates.  Please note that Coreg can also be used in place of the  metoprolol.  If the patient's renal function improves, she may be able  to tolerate an ACE inhibitor.  However, not at this point.   #3 - DIABETES:  The patient's diabetes is fairly well controlled during  this hospital stay.  We did decrease Januvia for renal dose to 25 mg  p.o. daily.  We continued her glyburide 5 mg p.o. daily.  Please note  that according to the patient's medical record she is on a long-acting  glyburide, so therefore when we wrote the discharge instructions we used  the short-acting one as well.  Hemoglobin A1c was 6.1.   #4 - ANEMIA.  It was noted throughout this hospital stay that the  patient had anemia.  Initially her hemoglobin was 11.5; however, during  the hospital stay it trended down.  On the day prior to discharge this  was as low as 8.1.  We did perform iron studies, which showed a mixed iron deficiency as well as a anemia of chronic disease.   Iron was low as  17, total iron by capacity was low at 224.  Percent saturation was low  at 8.  We feel this patient would benefit from NSAID or IV iron when she  is next hospitalized for her cardiac catheterization.  We did not start  any medications at this point in time.  The nephrologist may also  discuss these, and some IV medications to help this patient's anemia.  She was fecal occult blood negative during this hospitalization.  On the  day prior to discharge the patient's hemoglobin was 8.1.  We typed and  crossed her and transfused 2 units of packed red blood cells, which she  tolerated well.  On the day of discharge her CBC was back to up to 11.4.  Hemoglobin was back up to 1.5.   #5 - GASTROESOPHAGEAL REFLUX DISEASE:  The patient has numerous signs  and symptoms of gastroesophageal reflux disease, as well as gas.  We  kept her on Protonix during her hospital stay.  We are discharging her  on home Prevacid, which seemed to help.  In addition,  we continued the  patient's home __________ , as she states this helps her with her gas  burping.   #6 - EDEMA.  It was noted on chest x-ray that the patient had some signs  of congestive heart failure pulmonary edema, although her echocardiogram  remained normal.  She responded well to Lasix.  We did continue her on  Lasix 20 mg p.o. daily, as well as K-Dur (as she also had low  potassium).  She should continue this until her primary care Kewanna Kasprzak  has time to evaluate her at the next office visit.   #7 -[ HYPERLIPIDEMIA:  We continued the patient's statin and obtained a  fasting lipid panel.   #8 - OSTEOARTHRITIS.  We continued the patient on her Lidoderm patch as  well as Ultram to help her with her osteoarthritic pain.  She did,  during her hospital stay, develop one red erythematous, tender, enlarged  DIP joint of the second finger of the right hand.  Initially felt that  this was a pseudogout; however, after obtaining x-rays it  appeared this  to osteoarthritis.   #9 - CHRONIC KIDNEY DISEASE:  The patient is known to have chronic  kidney disease, and has seen Dr. Caryn Section at [facility] in  the past.  We continued to watch her creatinine during this hospital  stay, especially given that she had 2 cardiac catheterizations with dye.  Cardiology did use MucoMyst as well as bicarb and IV fluids to help  prevent IV contrast-induced nephropathy.  Her creatinine bumped after  her cardiac catheterization however, to a high of 2.25.  However, on day  of discharge it was remaining stable.  We continued to hold any  nephrotoxic medications and we did renal redose on the ones that she was  on.  Her Cozaar remained held.  We discontinued her hydrochlorothiazide.  A primary care Terrance Lanahan is going to have to follow her creatinine in an  outpatient setting.  She will follow up with Dr. Caryn Section in an outpatient  setting as well.  PERTINENT IMAGES:  Chest x-ray on admission showed bronchitic changes.  On the day prior to discharge it showed mild to large cardiac silhouette  and changes of congestive heart failure.  On day prior to discharge  there was a mild improvement in congestive heart failure and  interstitial edema.  Abdominal radiograph showed nonobstructive bowel  gas pattern.  VQ scan with low probability for pulmonary embolus.  Radiograph of the index finger show arthritis of the second DIP.   PERTINENT LABS:  On admission the patient's CBC level was  11.4,  hemoglobin 11.5, hematocrit 34.2, platelets 198.  On day of discharge  white blood cells 13.8, hemoglobin 11.5 hematocrit 34, platelets 142.  Please note that the patient's H&H trended anywhere from 11 down to 9,  and with a low of 8.1.  BNP 131. D-dimer was positive at 1.12.  Cardiac  markers were elevated at 0.33, troponin 0.51-0.6.  Comprehensive  Metabolic Panel:  Sodium 141, potassium 4.7, chloride 111, CO2 22,  glucose 103, BUN 42, creatinine 2.02  (these were on admission).  For the  most part BMP remained similar with creatinine however anywhere between  1.7-2.25, potassium (when we used Lasix) dropped down; however on the  day of discharge it 3.4 and we did replete this.  Iron studies:  Iron  17, iron binding capacity 224, percent saturation 8, ferritin 146.  Reticulocyte count percentage 3.2, absolute 86.1.  INR 1.0.  LIPID  PROFILE:  Total cholesterol 161, triglycerides 56, HDL 33, LDL 117,  hemoglobin A1c 6.1 (this includes a fecal occult blood negative).           ______________________________  Johney Maine, M.D.    JT/MEDQ  D:  06/02/2007  T:  06/03/2007  Job:  [id number]   cc:   Francisca December, M.D.  Melina Fiddler, MD  Arnette Norris. Sheffield Slider, M.D.

## 2011-04-18 NOTE — Patient Instructions (Signed)
Take the furosemide 20 mg today and in the morning. If your blood pressure at noon tomorrow is over 140/90 take another 20 mg tablet. Thereafter take 20 mg every morning until your next visit.  Return in [place] clinic the week after next. We will draw blood for kidney function then and send to Dr Caryn Section.   Your A1c was 6.2 on 04/11/11

## 2011-04-18 NOTE — Assessment & Plan Note (Signed)
Inadequate control of blood pressure. Will add back furosemide and carefully monitor the creatinine.

## 2011-04-18 NOTE — Op Note (Signed)
[name], [name]                   ACCOUNT NO.:  [id number]   MEDICAL RECORD NO.:  [id number]          PATIENT TYPE:  AMB   LOCATION:  SDS                          FACILITY:  [facility]   PHYSICIAN:  [name], M.D.    DATE OF BIRTH:  March 04, 1927   DATE OF PROCEDURE:  02/12/2009  DATE OF DISCHARGE:                               OPERATIVE REPORT   SURGEON:  [name], MD   ASSISTANT:  Nurse.   ANESTHETIC:  Local with MAC.   PREOPERATIVE DIAGNOSIS:  Chronic kidney disease.   POSTOPERATIVE DIAGNOSIS:  Chronic kidney disease.   PROCEDURE:  Left brachiocephalic arteriovenous fistula.   OPERATIVE PROCEDURE:  The patient was brought to the operating room in  stable condition.  Placed in supine position.  Left arm prepped and  draped in sterile fashion.   Skin and subcutaneous tissue were instilled with 1% Xylocaine.  A  transverse skin incision made through the left antecubital fossa.  Dissection carried down to expose the cephalic vein.  This was  mobilized, ligated distally with 2-0 silk, and divided.  Easily dilated  with heparin and saline.  Good quality vein and size, 4 mm.   The brachial artery then exposed and encircled proximally and distally.  Controlled with bulldog clamps.  Longitudinal arteriotomy made.  The  cephalic vein anastomosed end-to-side to the brachial artery using  running 7-0 Prolene suture.  Flushed.  Clamps removed.  Excellent flow  present.  Adequate hemostasis obtained.  Sponges and instrument counts  correct.   Subcutaneous tissue closed with running 3-0 Vicryl suture.  Skin closed  with 4-0 Monocryl.  Dermabond applied.   The patient tolerated the procedure well.  No apparent complications.  Transferred to recovery room in stable condition.     [name], M.D.  Electronically Signed     [name], M.D.  Electronically Signed   PGH/MEDQ  D:  02/12/2009  T:  02/12/2009  Job:  [id number]

## 2011-04-18 NOTE — Cardiovascular Report (Signed)
Belinda, [name] NO.:  [id number]   MEDICAL RECORD NO.:  [id number]          PATIENT TYPE:  INP   LOCATION:  6526                         FACILITY:  [facility]   PHYSICIAN:  Francisca December, M.D.  DATE OF BIRTH:  1927-10-04   DATE OF PROCEDURE:  05/27/2007  DATE OF DISCHARGE:                            CARDIAC CATHETERIZATION   PROCEDURES PERFORMED:  1. Percutaneous coronary intervention/drug-eluting stent implantation      mid anterior descending artery.  2. Percutaneous coronary intervention/drug-eluting stent implantation      mid first circumflex marginal.   INDICATIONS:  Belinda [name] is a 77 year old woman who presented midportion  of last week with prolonged chest pain.  She ruled in for an NSTEMI.  She has had fairly atypical symptoms in the past with epigastric  discomfort and eructation.  Coronary angiography last week revealed  subtotal stenoses which were relatively focal in the anterior descending  artery circumflex marginal and right coronary artery.  She has had  coronary angiography only because of mild to moderate renal  insufficiency.  Chronic creatinine at 1.8-2.0.  She received bicarbonate  infusion, Mucomyst and additional IV fluids over the weekend and had no  significant increase in her creatinine.  She is to return to the  catheterization laboratory at this time to allow for percutaneous  revascularization.   PROCEDURE NOTE:  The patient was brought to the cardiac catheterization  lab during the fasting state.  The right groin was prepped and draped in  the usual sterile fashion.  Local anesthesia was obtained with  infiltration of 1% lidocaine.  A 6-French catheter sheath was inserted  percutaneously into the right femoral artery utilizing an anterior  approach over guiding J-wire.  A 6-French, 3.0 CLS guiding catheter was  then advanced to the ascending aorta where the left coronary os was  engaged.  The patient received a 0.75 mg/kg  bolus of bivalirudin  followed by a 1.75 mg/kg per hour constant infusion.  The resultant ACT  was 337 seconds.  The patient had receive Plavix for the past 4 days at  75 mg daily.   A 0.014 inch Luge intracoronary guidewire was passed across the lesion  of the left anterior descending artery.  There was some difficulty  maneuvering the wire into the anterior descending artery because of a  short left main.  The lesion was primarily stented using a 2.7/28 mm  Scimed Taxus intracoronary drug-eluting stent.  The stent was deployed  at peak pressure of 12 atmospheres for not greater than 32 seconds.  The  stent balloon was deflated and removed and post dilatation was performed  in the proximal portion where the tightest part of the lesion was.  A  3.0/12 mm, Scimed Quantum Maverick intracoronary balloon was advanced  into place in the proximal portion and inflated to 16 atmospheres x20  seconds.  This balloon was deflated and removed and following  confirmation of adequate patency, the angioplasty wire was withdrawn and  then advanced down into the circumflex which really amounts to a single,  large, trifurcating marginal.  This lesion was also primarily stented  using a 3.0/24 mm Scimed Taxus intracoronary drug-eluting stent.  This  was carefully advanced into place, positioned and deployed at peak  pressure of 14 atmospheres for not greater than 37 seconds.  Following  intracoronary administration of 0.2 mg of nitroglycerin, cineangiography  was again performed documenting excellent wide patency and adequate  stent apposition in both the circumflex and anterior descending artery.  The guidewire was removed as well as the guiding catheter.  A right  femoral arteriogram in the 45-degree RAO angulation via the catheter  sheath by hand injection documented adequate anatomy for placement of  percutaneous closure device Angio-Seal.  This was subsequent deployed  with good hemostasis and intact  distal pulse.   Angiography:  As mentioned, the lesions treated were in the anterior  descending artery and left circumflex.  The anterior descending artery  lesion was more diffuse than I had appreciated on previous angiography.  It did require the 28 mm stent to cover the entire lesion.  There was a  more focal portion of probably no more than 3-4 mm in length that was  the 90% portion.  Following balloon dilatation and stent implantation,  there was no residual stenosis seen.  There is also a good step-up and  step-down at the proximal and distal edges of the stent without any  evidence of dissection.  Likewise, following balloon dilatation and  stent implantation in the circumflex marginal, there was no residual  stenosis with a good step-up and step-down at the proximal and distal  edges of the stent.  Again, no evidence of any dissection.  TIMI-3 flow  was present at the completion of both interventions.   FINAL IMPRESSION:  1. Atherosclerotic coronary vascular disease, three-vessel.  2. Status post successful percutaneous coronary intervention/drug-      eluting stent implantation mid anterior descending artery and mid      large circumflex marginal branch.  3. Typical angina was reproduced with device insertion and balloon      inflation and this was reinforced with the patient for future      reference.  4. A total of 105 mL of contrast were used for this procedure.      Francisca December, M.D.  Electronically Signed     JHE/MEDQ  D:  05/27/2007  T:  05/28/2007  Job:  [id number]   cc:   Deniece Portela A. Sheffield Slider, M.D.

## 2011-04-18 NOTE — Consult Note (Signed)
VASCULAR SURGERY CONSULTATION   Belinda [name], Belinda [name]  DOB:  Mar 07, 1927                                       01/28/2009  EAVWU#:[id number]   REFERRING PHYSICIAN:  Marina Gravel, M.D.   REFERRING DIAGNOSIS:  Stage 4 chronic kidney disease.   HISTORY:  The patient is a 77 year old female with a history of type 2  diabetes and hypertension.  She is status post left nephrectomy for  nonfunctioning kidney.  She has chronic stage 4 kidney disease.  Referred at this time for placement of hemodialysis access.   PAST MEDICAL HISTORY:  1. Type 2 diabetes.  2. Hypertension.  3. Left nephrectomy.  4. Hyperlipidemia.  5. Chronic kidney disease stage 4.  6. Basal cell carcinoma.  7. Osteoarthritis of the knees.  8. Atherosclerotic cardiovascular disease.   MEDICATIONS:  1. Plavix 75 mg daily.  2. Albuterol 90 mcg aerosol 2 puffs q.i.d.  3. Buspirone HCl 15 mg b.i.d.  4. Ecotrin 81 mg daily.  5. Florastor caps one packet b.i.d.  6. Januvia 100 mg one-quarter tablet daily.  7. Loratadine 10 mg daily.  8. Lopressor 50 mg daily.  9. Simvastatin 40 mg daily.  10.Lasix 40 mg.  11.Vitamin B6 one tablet daily.  12.Colchicine 0.6 mg b.i.d.  13.Allopurinol 150 mg daily.  14.Fluocinonide 0.5% ointment b.i.d.  15.Omeprazole 20 mg b.i.d.  16.Tramadol/acetaminophen 37.5/325 q.6 h p.r.n.  17.Glipizide 5 mg one-half tablet daily.   ALLERGIES:  Prilosec, Prevacid, Novocain, clonidine, codeine, Tavist,  Benadryl, gabapentin, ________.   SOCIAL HISTORY:  The patient is widowed with five children.  She is  retired.  Does not smoke, discontinued tobacco 20 years ago.  No regular  alcohol use.   REVIEW OF SYSTEMS:  Refer to patient encounter form.  The patient does  note joint discomfort.   PHYSICAL EXAM:  General:  An alert 78 year old female.  No acute  distress.  Vital signs:  BP is 144/68, pulse 60 per minute.  Upper  extremity revealed 2+ brachial and radial pulses bilaterally.  No  edema.   EVALUATION:  Vein mapping of the upper extremities reveals adequate  upper extremity cephalic vein on the left for placement of a  brachiocephalic fistula.   IMPRESSION:  1. Chronic kidney disease stage 4.  2. Coronary artery disease.  3. Hypertension.  4. Type 2 diabetes.  5. Hyperlipidemia.   PLAN:  The patient will be scheduled to undergo placement of left upper  arm arteriovenous fistula for hemodialysis access.   [name], M.D.  Electronically Signed  PGH/MEDQ  D:  01/28/2009  [name]:  01/29/2009  Job:  [id number]

## 2011-04-18 NOTE — Assessment & Plan Note (Signed)
Diabetes is well controlled and I'll confirm this with the [employer]

## 2011-04-18 NOTE — Progress Notes (Signed)
  Subjective:    Patient ID: Belinda [name], female    DOB: [birth year], 77 y.o.   MRN: [id number]  HPI here to seek a lowering of her blood pressure. He is scheduled for digit injection of arrive for macular degeneration in 2 days and they say they will not do it if her blood pressure is elevated. She had previously been on furosemide 20 mg one daily. This was stopped because of rising creatinine. After increasing her nifedipine to 60 mg daily she notes increased swelling of her ankles. Reports that she is now making adequate urine.  She requests a letter sent to the department transportation with her last hemoglobin A1c.   He complains of sore throat and headache. Says her vision has a bilateral dark film over it and she is anxious to get the eye injection.    Review of Systems     Objective:   Physical Exam She is pleasant but mildly anxious as usual Pharynx normal Chest is clear Heart regular rhythm without murmur Ankles 1+ edema         Assessment & Plan:

## 2011-04-18 NOTE — Cardiovascular Report (Signed)
Belinda [name], Belinda [name] NO.:  [id number]   MEDICAL RECORD NO.:  [id number]          PATIENT TYPE:  INP   LOCATION:  2909                         FACILITY:  [facility]   PHYSICIAN:  Francisca December, M.D.  DATE OF BIRTH:  May 14, 1927   DATE OF PROCEDURE:  05/23/2007  DATE OF DISCHARGE:                            CARDIAC CATHETERIZATION   PROCEDURES PERFORMED:  1. Left heart catheterization.  2. Coronary angiography.   INDICATIONS:  Belinda [name] is a 77 year old woman who was admitted  yesterday with prolonged chest pain radiating out into the arms.  Troponins have been minimally elevated today, as high as 0.66.  She has  responded to subcutaneous Lovenox and IV nitroglycerin.  She is brought  to catheterization laboratory at this time for coronary angiography only  due to a chronically elevated creatinine of 1.9 to 2.0.  She has  received IV hydration, Mucomyst and bicarbonate infusion.   PROCEDURAL NOTE:  The patient was brought to the cardiac catheterization  laboratory in a fasting state.  The right groin was prepped and draped  in the usual sterile fashion.  Local anesthesia was obtained with the  infiltration of 1% lidocaine.  A 4-French catheter sheath was inserted  percutaneously into the right femoral artery utilizing an anterior  approach over a guiding J-wire.  A 110-cm pigtail catheter was then used  to measure pressures in the ascending aorta and in the left ventricle.  Continuous pressure was recorded during a pullback of the catheter  across the aortic valve.  Coronary angiography was then performed using  4-French #4 left and right Judkins catheters.  Cineangiography was  conducted in multiple LAO and RAO projections.  At the completion of the  procedure, a right femoral arteriogram in the 45-degrees RAO angulation  demonstrated adequate anatomy for placement of the percutaneous closure  device, Angio-Seal; this was subsequently deployed with good  hemostasis  and intact distal pulse.   HEMODYNAMIC RESULTS:  Systemic arterial pressure was 201/77 with a mean  of 129 mmHg.  There was no systolic gradient across the aortic valve.  The left ventricular end-diastolic pressure was 23 mmHg pre-  ventriculogram.   ANGIOGRAPHY:  There was a right-dominant coronary system present.  The  main left coronary artery was without significant obstruction.   The left anterior descending artery and its branches are highly  diseased; there is a mid vessel stenosis which is tubular in nature and  reaches a maximum degree of stenosis of 90%.  The ongoing anterior  descending artery reaches and traverses the apex.  There are multiple  small diagonal branches.  No significant branch vessels are seen.   The left circumflex coronary artery and its branches are highly  diseased; the vessel gives rise essentially to a large trifurcating  marginal branch.  At the origin of an inferior subbranch, there is a 70%  to 80% tubular narrowing.   The right coronary and its branches are highly diseased; the vessel is  diffusely diseased in the distal portion with small targets.  There is a  95% stenosis in  the midportion and then an 80% stenosis in the mid to  distal portion.  Both of these lesions are tubular in nature.   Collateral vessels are not seen.   FINAL IMPRESSION:  1. Atherosclerotic cardiovascular disease, severe, three-vessel.  2. Systemic hypertension.  3. Elevated left ventricular end-diastolic pressure.   PLAN:  The patient presents a bit of a dilemma.  All 3 of her coronary  lesions could be reasonably treated with percutaneous devices.  However,  she is diabetic and gas three-vessel disease.  On the other hand, she  has renal insufficiency and the targets for the right coronary distally  are very poor; it is unlikely she could get full revascularization  there.  Therefore, I will discuss the options with her and consider  surgical  consultation.  Otherwise, we will proceed with percutaneous  intervention which would require at least 2 more return visits to the  catheterization laboratory, given her renal insufficiency.   The total contrast utilized today was 55 mL.      Francisca December, M.D.  Electronically Signed     JHE/MEDQ  D:  05/23/2007  T:  05/24/2007  Job:  [id number]   cc:   Wayne A. Sheffield Slider, M.D.

## 2011-04-18 NOTE — Discharge Summary (Signed)
Belinda [name], Belinda [name] NO.:  [id number]   MEDICAL RECORD NO.:  [id number]          PATIENT TYPE:  INP   LOCATION:  6525                         FACILITY:  [facility]   PHYSICIAN:  Francisca December, M.D.  DATE OF BIRTH:  [birth year]   DATE OF ADMISSION:  06/13/2007  DATE OF DISCHARGE:  06/15/2007                               DISCHARGE SUMMARY   DISCHARGE DIAGNOSES:  1. Coronary artery disease, status post percutaneous intervention to      the right coronary artery, utilizing drug-eluting stents x2.  2. Urinary tract infection, treated.  3. Superficial cutaneous skin infection, status post treatment.  4. Renal insufficiency.  5. Chronic anemia, secondary to renal insufficiency.   HOSPITAL COURSE:  Ms. Tomczak is a 77 year old female who was brought into  the hospital for cardiac catheterization.  She was admitted on June 13, 2007 for hydration due to her renal insufficiency.  Initially, on  admission, her creatinine was 2.25 with a BUN of 56.  By the time she  was hydrated and underwent cardiac catheterization her discharge BUN was  37 and creatinine was 1.9.   She ultimately underwent cardiac catheterization and was found to have a  long mid RCA lesion that required 2 TAXUS stent deployments.  The  patient tolerated the procedure well.  There was a question of thrombus  at the sheath site, Angiomax, Plavix and aspirin.  It was decided that  the sheath would be removed, closed with Angio-Seal, continue Angiomax  for 3-6 hours.  The areas was Angio-Seal without difficulty.  Peripheral  pulses remained intake and she tolerated the procedure well.   The following day her renal function had improved as stated above.  She  did have some leukocytosis documented in her lab work.  She was found to  have a urinary tract infection, as well as a cutaneous skin infection of  her finger.  She was treated with Septra DS and then discharged to home.   LAB STUDIES UPON DISCHARGE:   White count 12.8, hemoglobin 11.6,  hematocrit 34.5, platelets 191.  Sodium 139, potassium 5.3, BUN 37,  creatinine 1.93.  Blood cultures showed no growth for 5 days.  She had  mild leukocyte esterase in her urine with multiple bacteria colonies.   DISCHARGE MEDICATIONS:  1. Enteric-coated aspirin 325 mg a day.  2. Prevacid 30 mg a day.  3. Plavix 75 mg a day.  4. Toprol-XL 50 mg a day.  5. Lidoderm patch as prior to admission.  6. Nasonex as needed.  7. Claritin 10 mg a day.  8. Colace 100 mg twice a day.  9. MiraLax once a day as needed.  10.Januvia 5 mg a day.  11.Tramadol p.r.n.  12.Florastor 250 mg twice a day.  13.Septra DS 1 tablet daily until finished.  14.Sublingual nitroglycerin p.r.n. chest pain.   Patient is hold to hydrochlorothiazide, Lasix and potassium.  She can be  given an angina pill earlier in the week and this has been stopped as  well.   She is to remain on a diabetic  diet, increase activity slowly.  No  lifting over 10 pounds for 1 week.  No driving for 2 days.  Clean cath  site gently with soap and water.  Follow up with Dr. Amil Amen on June 20, 2007 at 9:10 a.m.      Guy Franco, P.A.      Francisca December, M.D.  Electronically Signed    LB/MEDQ  D:  07/09/2007  T:  07/09/2007  Job:  [id number]

## 2011-04-18 NOTE — Assessment & Plan Note (Signed)
OFFICE VISIT   Belinda [name], Belinda [name]  DOB:  1927/06/12                                       02/25/2009  EAVWU#:[id number]   The patient had a left brachial cephalic arteriovenous fistula created  02/12/2009.  She was seen in the office by Dr. Myra Gianotti on 02/22/2009  with complaints of pain and numbness in her left hand.  Evaluation  reveals evidence of a steal.   On further questioning today, she has continued pain, little  improvement.  Periods of numbness and discomfort.  No palpable left  radial pulse.   This clearly is a steal.  She will be scheduled for ligation of her AV  fistula tomorrow at [facility].   [name], M.D.  Electronically Signed   PGH/MEDQ  D:  02/25/2009  T:  02/26/2009  Job:  [id number]

## 2011-04-18 NOTE — Assessment & Plan Note (Signed)
OFFICE VISIT   Belinda [name], Belinda [name]  DOB:  08/04/1927                                       03/02/2009  [id number]#:[id number]   I saw the patient in the office today to evaluate her left upper arm AV  fistula.  She had a left brachiocephalic fistula placed by Dr. Madilyn Fireman on  02/12/2009.  Over the weekend she had called in because of some  cellulitis at the antecubital incision.  Dr. Hart Rochester put her on Keflex  and sent her for a followup visit today.   On examination the incision looks fine.  There is some mild ecchymosis  but no real cellulitis.  She has diminished but palpable radial pulse  and weak thrill in her fistula and a bruit present.  I reassured her I  did not think she had any significant cellulitis and she could  discontinue taking her Keflex.  Will have her see Dr. Madilyn Fireman in 3-4 weeks  to check on the maturation of her fistula.  She knows to call sooner if  she has problems.   Di Kindle. Edilia Bo, M.D.  Electronically Signed   CSD/MEDQ  D:  03/02/2009  T:  03/03/2009  Job:  [id number]

## 2011-04-18 NOTE — Op Note (Signed)
Belinda [name], Belinda [name]                   ACCOUNT NO.:  [id number]   MEDICAL RECORD NO.:  [id number]          PATIENT TYPE:  AMB   LOCATION:  SDS                          FACILITY:  [facility]   PHYSICIAN:  Di Kindle. Edilia Bo, M.D.DATE OF BIRTH:  [birth year]   DATE OF PROCEDURE:  02/26/2009  DATE OF DISCHARGE:                               OPERATIVE REPORT   PREOPERATIVE DIAGNOSIS:  Steal syndrome, left upper extremity.   POSTOPERATIVE DIAGNOSIS:  Steal syndrome, left upper extremity.   PROCEDURE:  Ligation of left upper arm arteriovenous fistula.   SURGEON:  Di Kindle. Edilia Bo, M.D.   ANESTHESIA:  Local with sedation.   TECHNIQUE:  The patient was taken to the operating room, sedated by  anesthesia.  The previous incision of the antecubital level was  anesthetized and the incision was opened.  The fistula was identified  and ligated with two 2-0 silk ties.  Hemostasis was obtained in the  wound.  There was good radial and ulnar signal with the Doppler.  The  wound was then closed with a deep layer of 3-0 Vicryl and the skin  closed with 4-0 Vicryl.  Dermabond was applied.  The patient tolerated  the procedure well, was transferred to the recovery room in satisfactory  condition.  All needle and sponge counts were correct.      Di Kindle. Edilia Bo, M.D.  Electronically Signed     CSD/MEDQ  D:  02/26/2009  T:  02/27/2009  Job:  [id number]

## 2011-04-18 NOTE — Assessment & Plan Note (Signed)
OFFICE VISIT   Belinda [name], Belinda [name]  DOB:  [birth year]                                       04/15/2009  YQMVH#:[id number]   The patient underwent a left brachiocephalic AV fistula on February 12, 2009.  She was seen back in the office on 2 occasions, once on February 22, 2009, with a possible steal followed by March 02, 2009, with some  redness in her antecubital incision.   On her current office visit at this time her fistula is occluded.  She  had undergone a ligation February 26, 2009.   She has 2+ left radial pulse.  She still has some residual pain in her  left thumb.  Good range of motion.   Will await instructions from Dr. Caryn Section regarding further placement of AV  access.   [name], M.D.  Electronically Signed   PGH/MEDQ  D:  04/15/2009  T:  04/16/2009  Job:  [id number]

## 2011-04-19 ENCOUNTER — Telehealth: Payer: Self-pay | Admitting: Family Medicine

## 2011-04-19 ENCOUNTER — Encounter: Payer: Self-pay | Admitting: Family Medicine

## 2011-04-19 NOTE — Telephone Encounter (Signed)
Pt told to call with bp reading, it was 178/90 , pulse was 78 at 11 am. Pt choked last night, felt like a lot of mucus & could not get it down, this morning she is horse & her throat hurts.

## 2011-04-19 NOTE — Telephone Encounter (Signed)
I called and spoke with Belinda [name] and recommended Tylenol for the URI symptoms. She will take 2 of the 20 mg furosemide tablets this afternoon when she gets her rx. She's too call if further changes are needed based on her blood pressure

## 2011-04-21 NOTE — Op Note (Signed)
   NAME:  Belinda [name], Belinda [name]                             ACCOUNT NO.:  [id number]   MEDICAL RECORD NO.:  [id number]                   PATIENT TYPE:  AMB   LOCATION:  NESC                                 FACILITY:  [facility]   PHYSICIAN:  Sigmund I. Patsi Casady, M.D.         DATE OF BIRTH:  Sep 10, 1927   DATE OF PROCEDURE:  09/07/2003  DATE OF DISCHARGE:                                 OPERATIVE REPORT   PREOPERATIVE DIAGNOSIS:  Complete obstruction of the left ureter.   POSTOPERATIVE DIAGNOSIS:  Complete obstruction of the left ureter.   OPERATION:  1. Cystourethroscopy.  2. Left retrograde pyelogram.  3. Ureteroscopy.  4. Left double J catheter.   SURGEON:  Sigmund I. Patsi Swoveland, M.D.   ANESTHESIA:  Genera LMA.   PREPARATION:  After appropriate preanesthesia, the patient was brought to  the operating room and placed upon the operating table in the dorsal supine  position, where general LMA anesthesia was introduced.  She was replaced in  the dorsal lithotomy position, where the pubis was prepped with Betadine  solution and draped in the usual fashion.   REVIEW OF HISTORY:  This 77 year old female was found to have some  obstruction of her left kidney in 2002 on CT but now has left flank pain,  intermittently but chronically, with MRI showing complete obstruction of her  left kidney.  She has had Lasix renal scan showing complete obstruction and  nonfunction of her left kidney.  She is now for cystoscopy, retrograde  pyelogram, and ureteroscopy.   DESCRIPTION OF PROCEDURE:  Cystoscopy was accomplished, and she has a normal-  appearing bladder.  Retrograde pyelogram was accomplished and shows a  completely obstructed left ureter.  The wire coils in the midureter.  Ureteroscopy was accomplished to the level of the obstruction, and there  appears to be a pinhole opening through the ureter.  A wire is eventually  worked through the opening, ureteroscopy is accomplished and appears to  show  some old embedded stone in the ureter.  Extravasation is noted on  retrograde and it is elected to place a double J catheter, and this is  accomplished using a 6 x 24 double J catheter.  Photo documentation is  accomplished, and the patient was awakened and taken to the recovery room in  excellent condition.                                                Sigmund I. Patsi Anding, M.D.    SIT/MEDQ  D:  09/07/2003  T:  09/07/2003  Job:  [id number]   cc:   Deniece Portela A. Sheffield Slider, M.D.  [address] [place]  [place] [address]  Fax: [phone]

## 2011-04-21 NOTE — Discharge Summary (Signed)
NAME:  Belinda [name], Belinda [name]                             ACCOUNT NO.:  [id number]   MEDICAL RECORD NO.:  [id number]                   PATIENT TYPE:  INP   LOCATION:  0377                                 FACILITY:  [facility]   PHYSICIAN:  Sigmund I. Patsi Fariss, M.D.         DATE OF BIRTH:  1927-02-19   DATE OF ADMISSION:  12/28/2003  DATE OF DISCHARGE:  01/02/2004                                 DISCHARGE SUMMARY   DISCHARGE DIAGNOSES:  1. Nonfunctioning left kidney.  2. Left ureteral stricture.   HISTORY OF PRESENT ILLNESS:  This is a 77 year old single female who  originally had a CT scan in 2002 showing a possible left ureteral mass and  obstruction of her left kidney.  Over time she has developed left flank pain  and gluteal pain.  Her pain waxes and wanes and she was very reluctant to  have treatment.  Lasix renal scan showed no function in the left kidney.  She was also noted to have small contracted left kidney.  Dr. Patsi Szymborski  talked extensively with the patient about treatment plans.  He discussed  nephrectomy versus nephroureterectomy with the patient.  She has agreed to  have this procedure done and was taken to [facility] for the  procedure.   PAST MEDICAL HISTORY:  1. Lumbosacral spine disease.  2. Hypertension.  3. Diabetes type 2.  4. Irritable bowel syndrome.  5. Hypercholesterolemia.  6. Basal cell carcinoma on her buttocks that was recently removed.   HOSPITAL COURSE:  The patient was taken to the operating room where radical  left nephrectomy and partial ureterectomy was performed (see operative  note).  The patient tolerated the procedure well and was taken to [facility] [place].  On postoperative day #1 the patient was complaining of some flank pain and  has positive flatus. She had not had a bowel movement on postoperative day  #1.  Postoperative day #2 Foley catheter was removed and the patient was  voiding with no problems.  She was ambulating very slowly around her  room  and was encouraged to ambulate within the halls.  On postoperative day #3  discharge planning was initiated for the patient to go home.  As she lives  alone, home health care would be set up for her to have some assistance for  the first several weeks out of the hospital.  She complained of some  abdominal tenderness and stated that she had not had a bowel movement.  KUB  was obtained on postoperative day #3 and there was no evidence of ileus  found.  On postoperative day #4 the patient was doing better.  Ambulating  well and continued with stool softeners.  The patient was discharged on  postoperative day #5 with home health care to come to her home to help with  her daily activities.   DISCHARGE MEDICATIONS:  Vicodin for pain, a stool softener, as well as  an  antibiotic.  The patient was to resume all home medications.   CONDITION ON DISCHARGE:  Stable.   PLAN:  The patient is to follow up in the office next week with Terri Piedra,  nurse practitioner, to have staples removed at postoperative day #10.     Terri Piedra, N.P.                         Sigmund I. Patsi Goecke, M.D.    Suella Grove  D:  01/13/2004  T:  01/13/2004  Job:  [id number]

## 2011-04-21 NOTE — Discharge Summary (Signed)
[name], [name] NO.:  [id number]   MEDICAL RECORD NO.:  [id number]          PATIENT TYPE:  INP   LOCATION:  2004                         FACILITY:  [facility]   PHYSICIAN:  Santiago Bumpers. Hensel, M.D.DATE OF BIRTH:  Apr 03, 1927   DATE OF ADMISSION:  03/06/2005  DATE OF DISCHARGE:  03/08/2005                                 DISCHARGE SUMMARY   DISCHARGE DIAGNOSES:  1.  Syncopal-like episode.  2.  Left knee pain.  3.  Diabetes.  4.  Hypertension.   DISCHARGE MEDICATIONS:  1.  Albuterol two puffs q.6h. p.r.n. asthma.  2.  BuSpar 50 mg one tab a day.  3.  Combivent two puffs b.i.d.  4.  Doxazosin was put on hold.  5.  Ecotrin 325 mg p.o. q.d.  6.  Flonase p.r.n.  7.  Hyzaar 100/25 mg p.o. q.d.  8.  Metoprolol 50 mg b.i.d.  9.  Multivitamins one p.o. q.d.  10. Prevacid 30 mg 1-2 tabs p.o. q.d.  11. Neurontin 300 mg p.o. q.d.  12. Sular 10 mg placed on hold.  13. Tramadol 50 mg 1-2 tabs q.6h. p.r.n. pain.   DIET:  Carbohydrate modified diabetic diet was recommended.   SPECIAL INSTRUCTIONS:  The patient was recommended to keep herself hydrated.  Avoid straining in the toilet for defecation by increasing the amount of  fiber in the diet or with stool softeners.  Also it was recommended to use  supportive devices in the bathroom to get in and out of the toilet.  Also  patient had a followup appointment set up prior to discharge with Dr.  Ty Hilts who is her cardiologist and also was instructed to follow up with  her primary care Belinda [name] which is Dr. Sheffield [name] at the family practice center in  1-2 weeks after discharge.   BRIEF HOSPITAL COURSE:  Belinda [name] is a 77 year old female with a past  medical history remarkable for diabetes, anxiety, hypertension, asthma,  pseudogout, who reported that prior to coming to the emergency department  had about a two week history of left knee pain for which she was seen by  chiropractor in the morning of 03/07/05.  While in the  chiropractor, she went  to the bathroom and while she was having a bowel movement, she felt very  weak and dizzy which prompted her to call the nurses.  The next thing she  knew was that she had passed out and was laying on the floor.  She denied  any head trauma and apparently she had loss of consciousness for about a  minute or two.  She also reported that she had been feeling dizzy for the  last few days and had some decreased p.o. intake and had been treated for  bronchitis for a total of 7 days of antibiotic treatment about two weeks  prior to this new event.  The patient was found to be orthostatic in the  emergency department and also her EKG showed sinus bradycardia with  occasional premature supraventricular complexes.   PHYSICAL EXAMINATION:  The patient was pleasant, in no  acute distress.  Able  to ambulate without difficulty.  Was very cooperative and well-articulated.  Alert and oriented x3.  She seemed to be mildly dehydrated with positive  signs like dry lips and thick tongue.  LUNGS:  Clear to auscultation.  CARDIOVASCULAR:  She had a regular rate and rhythm with no JVD.  Only positive on physical exam was left knee pain to extension and flexion  with mild effusion but no erythema.  Otherwise her physical exam was within  normal limits.   ADMISSION LABS:  Sodium 138, potassium 5.8, chloride 113, CO2 20, BUN 62,  creatinine 2.1, glucose 143.  Calcium 9.3.  She had white blood cell count  of 11.6, hemoglobin 12.4, hematocrit 36.5, platelets 163.  AST 20, ALT 13,  alkaline phosphatase 82, total bilirubin 0.6.  Cardiac enzymes were normal  three times.  Urinalysis:  Specific gravity 1.021, negative nitrites,  negative bilateral, negative for ketones, protein 30, moderate leukocyte  esterase.  Pro time 12.6, INR 1.  EKG showed sinus bradycardia, first degree  AV block.  Head CT showed generalized atrophy but no intracranial acute  abnormalities.   The patient was feeling  well and wanted to go home.  It was decided to admit  her for 23 hours observation to monitor her cardiac function.   Problem 1. Syncopal-like episode.  This fainting episode could  have been  secondary to orthostatism related to dehydration.  After talking to Dr.  Sheffield [name], who is Belinda [name]' primary care Belinda [name], he reported that she had had  intermittent episodes of fecal urgency with weak spells in the past, that  she had never passed out completely before.  So this syncopal-like episode  might very well be related to vasovagal response related to the fact that  the patient was in the toilet having a bowel movement when it happened.  She  might also have been mildly dehydrated.  This also could have contributed to  this vasovagal response.  This could explain her bradycardia and  orthostatism as well and it could have been dehydration.  So initially the  patient was treated with two boluses of 100 mL and her blood pressure was  monitored and remained stable.  She had repeated EKGs which had subsequently  normalized in terms of rate but did not show any significant changes and her  cardiac markers were normal three times.  The patient was discharged to  follow up with her cardiologist.  She could possibly benefit from a Holter  study to assess for any presence of arrhythmias.  She did have a history of  bifascicular block and her cardiac function remained stable on the 23 hours  of admission.   Problem 2. Left knee pain.  The patient was able to ambulate, still  complaining of moderate pain on discharge.  We decided to add Tramadol 50 mg  1-2 tabs q.6h. to her regimen and recommended her to follow up with Dr. Sheffield [name]  at the family practice center to assess further possibility of another  corticosteroid injection as this has caused relief for her in the past.   Problem 3. Renal insufficiency.  The patient had a creatinine of 2.1.  She had a history of chronic renal insufficiency related to a  left-side  nephrectomy in the past.   Problem 4. Diabetes remained stable.  The patient is diet controlled.  She  was placed on a carbohydrate modified diet and was recommended to continue  this as an outpatient.  Problem 5. Medications.  Sular and doxazosin were held during admission  given that patient was orthostatic and will be initially to follow up with  cardiologist to decide if he recommends patient to be back on these  medications.   On discharge day, the patient was doing well but still had moderate left  knee pain but was able to walk with assistance.  She did not have any  episode of SVT or bradycardia overnight.  She denied any dizziness, chest  pain or shortness of breath since admission.  T max 98.3, heart rate in the  last 23 hours was between 54 and 78 and the blood pressure systolic was  between 111-162 and diastolic between 48 and 63.  Her O2 sat was between 95  and 100% on room air.   DISCHARGE LABS:  White blood cell count 8.7, hemoglobin 10.9, hematocrit  32.3.  Sodium 136, potassium 5.4, chloride 115, CO2 18, BUN 45, creatinine  1.5, glucose 135.  Calcium 8.3.   She was discharged home improved and in stable condition.      AM/MEDQ  D:  04/28/2005  T:  04/29/2005  Job:  [id number]

## 2011-04-21 NOTE — Op Note (Signed)
NAME:  Belinda [name], Belinda [name]                   ACCOUNT NO.:  [id number]   MEDICAL RECORD NO.:  [id number]          PATIENT TYPE:  AMB   LOCATION:  ENDO                         FACILITY:  [facility]   PHYSICIAN:  John C. Madilyn Fireman, M.D.    DATE OF BIRTH:  1927/10/02   DATE OF PROCEDURE:  01/01/2006  DATE OF DISCHARGE:  01/01/2006                                 OPERATIVE REPORT   PROCEDURE:  Capsule endoscopy.   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   FINDINGS:  Gastric transit time was 11 minutes.  Small bowel transit time  was 6 hours and 27 minutes.  No abnormalities were seen in the stomach,  small intestine, or cecum.   IMPRESSION:  Normal exam.   PLAN:  We will pursue other work-up as indicated.           ______________________________  Everardo All. Madilyn Fireman, M.D.     JCH/MEDQ  D:  01/04/2006  T:  01/04/2006  Job:  [id number]

## 2011-04-21 NOTE — Discharge Summary (Signed)
[facility]  Patient:    Belinda [name], Belinda [name] Visit Number: [id number] MRN: [id number]          Service Type: MED Location: [phone] Attending Physician:  Willow Ora Dictated by:   Vear Clock, M.D. Admit Date:  02/01/2002 Discharge Date: 02/04/2002   CC:         Wayne A. Sheffield Slider, M.D., [facility]  John C. Madilyn Fireman, M.D., [facility]   Discharge Summary  CHIEF COMPLAINT:  Abdominal pain.  PRIMARY PHYSICIAN:  Wayne A. Sheffield Slider, M.D.  DISCHARGE DIAGNOSES: 1. Dehydration, resolved. 2. Abdominal pain, guaiac positive, resolving. 3. Acute renal failure secondary to #1. 4. Hypertension. 5. Anxiety. 6. Urinary tract infection.  DISCHARGE MEDICATIONS: 1. Senokot 2 tabs p.o. q.h.s. 2. Ciprofloxacin 250 mg p.o. b.i.d. times a total of seven days. 3. Tylenol 650 mg p.o. q.4-6 hours p.r.n. pain. 4. Albuterol 2 puffs q6h p.r.n. wheezing or cough. 5. Atenolol 25 mg p.o. q.d. 6. BuSpar 7.5 mg p.o. q.d. 7. Hyzaar 100/25 mg p.o. q.d. 8. Protonix 40 mg p.o. q.d. 9. Zocor 20 mg p.o. q.d.  FOLLOW-UP:  Patient is to see Dr. Madilyn Fireman from [facility] on Monday, March 31st, at 1 PM.  Patient is to see Dr. Zachery Dauer at [facility] on Tuesday, March 11, at 11:30 AM.  PROCEDURES AND DIAGNOSTIC STUDIES:   Acute abdominal series on March 01st showed normal bowel gas pattern and no evidence for perforated viscus.  Mild cardiomegaly.  No active chest disease radiographically.  CONSULTANTS:  None.  ADMISSION HISTORY AND PHYSICAL:  [age]-year-old white female complaining of abdominal pain times one week, has been in bed for that time period.  She has been using Dulcolax as a stool softener.  One week ago passed dark and bright red blood in her stool.   Patient reports diarrhea as well; complains of gassy pain.  Abdominal pain is no exacerbated with movement.  She was seen last Monday at the family practice center with the primary  diagnosis of diverticulitis flare with positive guaiac last Monday.  Decreased p.o. intake this week, taking water only, approximately two glasses a day.  PHYSICAL EXAMINATION:  VITAL SIGNS:  Patient vital signs are stable on admission.  HEENT:  On exam she has a dry tongue with minimal oral saliva.  NECK:  Bilateral cervical lymphadenopathy.  ABDOMEN:  Soft with mild diffuse tenderness.  No peritoneal signs.  No fluid wave and no hepatosplenomegaly.  RECTAL:  Patient is heme negative on admission with dilated anterior rectum with minimal stool.  ADMISSION LABORATORIES:  White count was 11.4, hemoglobin of 14.1 and platelets 136,000.  Chemistries within normal limits.  LFTs were normal on admission.  A UA at admission showed no glucose, 15 ketones, no nitrites, moderate LE, 7-10 WBCs, 0-2 RBCs, few bacteria, and hyaline casts. Electrolytes:  creatinine was 3.5 and BUN was 59.  Patient was admitted for dehydrated.  She was rehydrated with IV fluids.  HOSPITAL COURSE:  #1 Dehydration:  Patient had an elevated BUN and creatinine on admission.  This was likely prerenal secondary to dehydration from the nausea, vomiting and diarrhea.  Patient was rehydrated with the deficit corrected in 24-36 hours.  Patient did well and was eventually able to hold down p.o. liquids without difficulty.  #2 Abdominal pain:  Previous history of guaiac positive, question diverticulitis.  Patient did receive antibiotics in the clinic last week.  At admission she was afebrile with normal white  blood count, so question resolution of the diverticulitis versus a viral gastritis.  KUB relatively normal.  No acute abdomen on exam.  Patient was followed clinically and advanced; was concerned about her constipation and gas.  Patient will have follow up with Dr. Madilyn Fireman from GI; she has seen him in the past and this can be further explored.  #3 Acute renal failure, likely secondary to dehydration:  This did  correct with rehydration.  #4 Anxiety:  Patient was given Ativan during hospitalization as she was now on p.o. meds.  She will go him, again, on BuSpar.  #5 Hypertension:  Patient was normotensive throughout her hospitalization and will continue her home meds after discharge.  #6 UTI:  Patient was complaining of increased urinary urgency and with her probable UTI based on her urine sample patient was treated with Cipro 250 mg p.o. b.i.d. times seven days.  If there is a component of diverticulitis Cipro is also an appropriate antibiotic for that as well.  Discharge laboratories were as follow.  B-MET on March 03rd showed sodium 143, potassium 4.2, chloride 114, CO2 21, glucose 158, BUN 24, creatinine 1.7, and calcium 9.3.  CBC:  WBCs 8.9, hemoglobin 12.7 and platelets 193,000.  Urine culture was apparently not sent.  Patient was discharged to home without further incident. Dictated by:   Vear Clock, M.D. Attending Physician:  Willow Ora DD:  02/04/02 TD:  02/04/02 Job: [id number]

## 2011-04-21 NOTE — Procedures (Signed)
[facility]  Patient:    Belinda [name], Belinda [name] Visit Number: [id number] MRN: [id number]          Service Type: END Location: ENDO Attending Physician:  Louie Bun Dictated by:   Everardo All Madilyn Fireman, M.D. Proc. Date: 03/17/02 Admit Date:  03/17/2002   CC:         Wayne A. Sheffield Slider, M.D.   Procedure Report  PROCEDURE:  Esophagogastroduodenoscopy with esophageal dilatation and polypectomy.  INDICATION FOR PROCEDURE:  Abdominal pain, heme-positive stools, and dysphagia.  DESCRIPTION OF PROCEDURE:  The patient was placed in the left lateral decubitus position and placed on the pulse monitor with continuous low-flow oxygen delivered by nasal cannula.  She was sedated with 50 mg IV Demerol and 3 mg IV Versed.  The Olympus video endoscope was advanced under direct vision into the oropharynx and esophagus.  The esophagus was straight and of normal caliber with the squamocolumnar line at 38 cm.  There was no visible esophagitis, ring, stricture or other abnormality of the GE junction.  The stomach was entered, and a small amount of liquid secretions were suctioned from the fundus.  Retroflexed view of the cardia was unremarkable.  The fundus and body appeared normal.  In the immediate prepyloric area, there was a small, vaguely delineated antral polypoid structure that was fulgurated by hot biopsy.  The pylorus itself was nondeformed and easily allowed passage of the endoscope tip into the duodenum.  Both the bulb and second portion were well-inspected and appeared to be within normal limits.  A Savary guidewire was placed through the endoscope channel and the scope withdrawn.  A single 16 mm Savary dilator was passed over the guidewire with minimal resistance, then removed together with the wire.  There was no blood seen on withdrawal. The patient was then prepared for colonoscopy.  IMPRESSION:  Small antral polyp, otherwise normal endoscopy, status  post esophageal dilatation due to complaints of dysphagia.  Cannot rule out a subtle ring.  PLAN: 1. Await histology and advance diet and observe response to dilatation. 2. We will proceed to colonoscopy as planned. Dictated by:   Everardo All Madilyn Fireman, M.D. Attending Physician:  Louie Bun DD:  03/17/02 TD:  03/17/02 Job: [id number]

## 2011-04-21 NOTE — Op Note (Signed)
NAME:  Belinda [name], Belinda [name]                             ACCOUNT NO.:  [id number]   MEDICAL RECORD NO.:  [id number]                   PATIENT TYPE:  INP   LOCATION:  X004                                 FACILITY:  [facility]   PHYSICIAN:  Sigmund I. Patsi Rotenberg, M.D.         DATE OF BIRTH:  1927/04/27   DATE OF PROCEDURE:  12/28/2003  DATE OF DISCHARGE:                                 OPERATIVE REPORT   PREOPERATIVE DIAGNOSIS:  Nonfunctioning left kidney, left distal  ureteral  stricture.   POSTOPERATIVE DIAGNOSIS:  Nonfunctioning left kidney, left distal  ureteral  stricture.   PROCEDURE:  1. Cystoscopy.  2. Double-J stent removal.  3. Left ureteroscopy.  4. Left nephrectomy with left partial ureterectomy.   SURGEON:  Sigmund I. Patsi Marchiano, M.D.   ASSISTANTS:  1. Lindaann Slough, M.D.  2. Susanne Borders, MD  3. [facility].   ANESTHESIA:  General endotracheal anesthesia. Local wound injection with 30  mL of 0.25% Marcaine plus intercostal block of the 12th rib, insertion of  Marcaine pump for postoperative analgesia.   ESTIMATED BLOOD LOSS:  Minimal.   DRAINS:  Foley catheter to  straight drain, Marcaine pump tubing.   SPECIMENS:  Left kidney with proximal left ureter for frozen section.   COMPLICATIONS:  None.   DISPOSITION:  To the post anesthesia care unit in stable condition.   INDICATIONS FOR PROCEDURE:  Ms. [name] is a 77 year old female with a history  of  right flank pain. She has been followed  by  Dr. Patsi Zetino in the past  and was found to have hydroureteronephrosis down to a transition point at  approximately  the pelvic brim. The patient has been reluctant to have  anything done for this. She has never undergone any retrograde studies  despite attempts to have her  undergo this. The patient has recently had  more right flank pain and wishes to have this problem treated. The patient  has been evaluated with Lasix  renogram with  a stent in which  demonstrated  no  function of her left kidney.   A nephrectomy has been offered to the patient and she has consented to this.  The plan is to perform a left retrograde pyelogram and ureteroscopy to  determine the nature  of her distal  left ureteral stricture. If this lesion  is consistent with transitional cell carcinoma, the plain is to perform a  left nephroureterectomy with excision of the bladder cuff. If the stricture  is secondary to a benign cause, then nephrectomy with a partial ureterectomy  will be performed.   DESCRIPTION OF PROCEDURE:  The patient was brought to the operating room and  correctly identified  by her identification bracelet. She was given  preoperative antibiotics and general endotracheal anesthesia. She was placed  in the dorsal lithotomy position and her genitalia were prepped and draped  in the typical sterile fashion.   A cystoscopy was  performed utilizing the 22 French  cystoscope sheath and 12  degree lens. The urethra was within normal limits with no mucosal  abnormalities. The bladder mucosa demonstrated no mucosal abnormalities or  tumors. The ureteral orifices were identified  in the normal anatomic  location. A double-J stent was seen protruding through the left ureteral  orifice. There was some mild bullous edema  around the left ureteral orifice  likely  secondary to the indwelling stent.   The stent was grasped with the graspers and pulled through the urethra. A  0.38-mm guide wire was passed through the stent and up into the left  collecting system. The double-J stent was  backed out over the wire and the  wire was evacuated through the cystoscope.   A 6 French endhole ureteral stent was loaded over  the guide wire and into  the distal ureter, and  the guide wire was removed. A retrograde pyelogram  was performed which demonstrated hydroureteronephrosis down to a transition  point just at the pelvic brim. The guide wire was replaced through the  endhole  ureteral catheter and the cystoscope was removed.   Ureteroscopy was then  performed with a rigid ureteroscope. The ureteroscope  could be passed up to this strictured area just at the level of the pelvic  brim, but could not be passed any further. This area was not consistent with  a transitional cell carcinoma. There appeared  to be scar tissue at this  area, but the mucosa was not erythematous whatsoever.   The ureteroscope was removed and the patient was repositioned in the left  flank position to make a flank incision. She was reprepped and  draped in a  sterile fashion. An incision was made from the tip of the 12th rib and  extended medially. Approximately 4 cm of the 12th rib was removed.   A scalpel was used to incise the skin. The Bovie electrocautery was used to  incise a __________ in Scarpa's fascia  down to the external oblique fascia.  The fascia was incised with the Bovie electrocautery. The cautery was used  to cut down on the tip of the 12th rib.   At the tip of the 12th rib, all the muscular layers were incised such that  the retroperitoneal fat could be seen. A surgeon's finger  was placed in  this area and the remainder of the abdominal wall musculature layers were  incised with Bovie  electrocautery, extending medially. The surgeon's hand  was used to push the peritoneum medially to avoid entering the peritoneum  through this incision.   The 12th rib was cleaned with periosteal elevators and was cut with a rib  cutter. The end was filed with a rasp. Blunt dissection was used with the  surgeon's finger to dissect through the retroperitoneal fat to the kidney.  The kidney was very atrophic and was somewhat difficult to palpate. Using  blunt dissection the kidney was eventually grasped with a Babcock clamp.   Meticulous dissection was used around the small kidney from the lower pole to the upper  pole. Blunt dissection as well as sharp dissection with  Metzenbaum  scissors was employed. Vessels that were encountered were bovied  with electrocautery. The adrenal gland was identified at the superior  pole  of the kidney and was dissected carefully  away from the kidney using right-  angle dissection and the Bovie electrocautery.   Turning our attention to the renal hilum, sharp dissection with Metzenbaum  scissors was used and the renal vein was identified. This vein was very  small. The left gonadal vessel was identified prior to the vein and was used  to trace up to the level of the vein. A right-angle was carefully  passed  around the left gonadal vessel and was clipped and incised with Metzenbaum  scissors. The right-angle was likewise passed underneath the left renal  vein and was clipped 3 times proximally  and 1 time distally and incised  with a scalpel.   Just  superior  to the renal vein, the renal artery was identified by  employing sharp dissection with Metzenbaum scissors in this area. The artery  was also dissected freely with a right-angle such that  it could be clipped  3 times proximally  and 1 time distally, and it was incised with a long  handle scalpel. There was a very small  venous branch just above the renal  artery which was doubly clipped and incised.   At this point the superior pole of the kidney and the hilum was  completely  freed. Posteriorly, there were some attachments to the lower pole of the  kidney which were incised with the Bovie electrocautery. The left ureter and  left renal pelvis were very dilated and could easily be palpated.   The surgeon's hand was passed around the left ureter and the fatty tissue  attachments to the left ureter were incised with the Bovie electrocautery as  far distally as could be done safely. A large right-angle  clamp was placed  distally and proximally and it was incised with the Metzenbaum scissors. The  stump of the left ureter was ligated with a 0 silk tie. The specimen was   passed off the table and sent for frozen section.   The wound was then copiously irrigated with normal saline. The internal  oblique fascia and transversalis fascia were reapproximated with a 0 PDS in  a running fashion. The external oblique fascia was also reapproximated with  a #1 PDS in a running fashion. The edge of the incision was injected with  approximately 30 mL of 0.25% Marcaine. A 12th rib intercostal block was also  employed, approximately 10 mL of 0.25% Marcaine. The Marcaine pump trocar  was placed through the subcutaneous tissue in the medial aspect of the wound  and the catheter was placed through the trocar. The trocar was removed. The  skin was closed with a stapling device.   The pathologist informed  us that the distal most aspect of the specimen of  the ureter contained fibrotic material but no signs of any malignancy. The decision was made at this point to leave the distal most ureter as there was  no indication that there were malignant cells causing her stricture.   The patient was then awakened from her anesthesia and taken to the post  anesthesia care unit in stable condition. She tolerated  the procedure well.  Dr. Patsi Qualley was present and participated in all aspects of the case, as  he was the primary surgeon.     Susanne Borders, MD                           Sigmund I. Patsi Marengo, M.D.    DR/MEDQ  D:  12/28/2003  T:  12/28/2003  Job:  [id number]

## 2011-04-21 NOTE — Procedures (Signed)
[facility]  Patient:    Belinda [name], Belinda [name] Visit Number: [id number] MRN: [id number]          Service Type: END Location: ENDO Attending Physician:  Louie Bun Dictated by:   Everardo All Madilyn Fireman, M.D. Proc. Date: 03/17/02 Admit Date:  03/17/2002   CC:         Wayne A. Sheffield Slider, M.D.   Procedure Report  PROCEDURE:  Colonoscopy.  INDICATION FOR PROCEDURE:  Heme-positive stools in a patient with no colon screening within the last five years.  DESCRIPTION OF PROCEDURE:  The patient was placed in the left lateral decubitus position and placed on the pulse monitor with continuous low-flow oxygen delivered by nasal cannula.  She was sedated with 20 mg of IV Demerol and 1 mg of IV Versed in addition to the 50 mg Demerol and 3 mg Versed given for the previous EGD.  The Olympus video colonoscope was inserted into the rectum and advanced to the cecum, confirmed by transillumination at McBurneys point and visualization of the ileocecal valve and appendiceal orifice.  The prep was excellent.  The cecum, ascending, transverse, descending, and sigmoid colon all appeared normal with no masses, polyps, diverticula or other mucosal abnormalities.  The rectum likewise appeared normal, and retroflex view of the anus revealed some small internal hemorrhoids with no stigma of hemorrhage. The colonoscope was then withdrawn, and the patient returned to the recovery room in stable condition.  She tolerated the procedure well, and there were no immediate complications.  IMPRESSION: 1. Internal hemorrhoids. 2. Otherwise normal colonoscopy.  PLAN:  Next colon imaging in five years. Dictated by:   Everardo All Madilyn Fireman, M.D. Attending Physician:  Louie Bun DD:  03/17/02 TD:  03/17/02 Job: [id number]

## 2011-04-21 NOTE — Op Note (Signed)
NAME:  Belinda [name], Belinda [name]                             ACCOUNT NO.:  [id number]   MEDICAL RECORD NO.:  [id number]                   PATIENT TYPE:  AMB   LOCATION:  DSC                                  FACILITY:  [facility]   PHYSICIAN:  Sandria Bales. Ezzard Standing, M.D.               DATE OF BIRTH:  [birth year]   DATE OF PROCEDURE:  06/26/2003  DATE OF DISCHARGE:                                 OPERATIVE REPORT   CCS# [id number]   PREOPERATIVE DIAGNOSIS:  A 3.2 x 2.4 basal cell carcinoma of intergluteal  cleft.   POSTOPERATIVE DIAGNOSIS:  A 3.2 x 2.4 basal cell carcinoma of intergluteal  cleft.   OPERATION PERFORMED:  Wide excision of basal cell carcinoma.   SURGEON:  Sandria Bales. Ezzard Standing, M.D.   ASSISTANT:  None.   ANESTHESIA:  MAC with approximately 20mL of 1% Xylocaine plain.   COMPLICATIONS:  None.   INDICATIONS FOR PROCEDURE:  Ms. Twining is a pleasant 77 year old white female  who had a biopsy of a lesion between her buttocks by Dr. Dorinda Hill on  May 08, 2003, which revealed a basal cell carcinoma.  She now comes for wide  excision of this lesion.  I discussed the indications, potential  complications, explained to the patient potential complications included but  not limited to bleeding, infection, recurrence of the tumor.   DESCRIPTION OF PROCEDURE:  She presents to the [facility], was placed in a prone position.  She was given intravenous  sedation,.  Her buttocks were prepped with Betadine solution and sterilely  draped.  This lesion measured approximately 3.2 x 2.4 cm.  It was right at  the top of her intergluteal cleft.  I marked it diagonally, doing excision  of this tumor.  The skin was marked with a long suture to the left side of  the excision, a short suture to the cranial side of the incision.  I got  into the subcutaneous fat below the lesion.   This was then sent to pathology.  Permanent pathology is pending at time of  the dictation.  The skin defect was then  closed using interrupted 2-0 nylon  sutures.  The entire length of the incision was approximately 10 cm in  length.  The patient tolerated the procedure well. It was sterilely dressed.  She will be discharged.  I will leave the sutures in for two weeks.  She  will come back in two weeks for follow-up.  She knows to call for any  interval problems.                                                Sandria Bales. Ezzard Standing, M.D.    DHN/MEDQ  D:  06/26/2003  T:  06/26/2003  Job:  [id number]   cc:   Deniece Portela A. Sheffield Slider, M.D.  [address] [place]  [place] [address]  Fax: [phone]   Dollene Cleveland, M.D.  [address] [place]  [place] [address]  Fax: [phone]   Sigmund I. Patsi Dinovo, M.D.  [address]  [place]  [place] [address]  Fax: [phone]

## 2011-04-21 NOTE — Op Note (Signed)
   NAME:  Belinda [name], Belinda [name]                             ACCOUNT NO.:  [id number]   MEDICAL RECORD NO.:  [id number]                   PATIENT TYPE:  EMS   LOCATION:  MAJO                                 FACILITY:  [facility]   PHYSICIAN:  Dorna Leitz, M.D.                 DATE OF BIRTH:  Jun 11, 1927   DATE OF PROCEDURE:  08/08/2002  DATE OF DISCHARGE:  08/02/2002                                 OPERATIVE REPORT   PREOPERATIVE DIAGNOSIS:  Left nasolabial mass.   POSTOPERATIVE DIAGNOSIS:  Left nasolabial mass.   PROCEDURE:  Incisional biopsy left nasolabial mass.   SURGEON:  Dorna Leitz, M.D.   ANESTHESIA:  General endotracheal anesthesia.   ESTIMATED BLOOD LOSS:  None.   COMPLICATIONS:  None.   INDICATIONS:  This patient is a 77 year old female who has previously  undergone incisional biopsy of a left nasolabial mass which has been present  for approximately nine months.  Findings of this revealed skeletal muscles  showing scarring with associated reactive sarcolemmal changes.  There was no  evidence of malignancy.  It was determined that a larger biopsy specimen was  necessary.   FINDINGS:  The patient was noted to have a 1 x 2 cm area of very firm  fibrous type tissue located above the left lateral incisor extending to the  canine fossa.   DESCRIPTION OF PROCEDURE:  The patient was taken to the operating room and  placed on the table in the supine position.  She was then placed under  general endotracheal anesthesia, and the left upper lip retracted  Then 1%  lidocaine with 1:100,000 epinephrine was used to inject the buccolabial  sulcus.  Bovie cautery was used to incise the mucosa and underlying very  thick tissue which was fibrous in quality down to the bone.  Elevation  occurred superior to the fibrous tissue, and Bovie cautery was used to take  a 1 x 1 cm portion of fibrous tissue out.  The area was then irrigated, and  the incision closed in an interlocking running stitch  using 3-0 Vicryl.  The  patient was awakened from anesthesia and taken to the post anesthesia care  unit in stable condition. There were no complications.                                               Dorna Leitz, M.D.   SLJ/MEDQ  D:  08/08/2002  T:  08/11/2002  Job:  [id number]   cc:   Delon Sacramento, M.D.   [facility]

## 2011-04-21 NOTE — H&P (Signed)
NAME:  Belinda [name], Belinda [name]                             ACCOUNT NO.:  [id number]   MEDICAL RECORD NO.:  [id number]                   PATIENT TYPE:  INP   LOCATION:  0377                                 FACILITY:  [facility]   PHYSICIAN:  Sigmund I. Patsi Flood, M.D.         DATE OF BIRTH:  01-10-1927   DATE OF ADMISSION:  12/28/2003  DATE OF DISCHARGE:                                HISTORY & PHYSICAL   HISTORY OF PRESENT ILLNESS:  This is a 77 year old single female who  originally had a CT scan in 2002 showing possible left ureteral mass and  obstruction of her left kidney.  She has developed left flank pain, left  gluteal pain, and left low flank pain.  Her pain waxes and wanes, and she is  reluctant to have treatment.  She had a Lasix renal scan showing no function  in the left kidney.  She is also noted to have a small, contracted left  kidney.   PAST MEDICAL HISTORY:  1. Lumbosacral spine disease.  2. Hypertension.  3. Diabetes type 2.  4. Irritable bowel syndrome.  5. Hypercholesterolemia.  6. Basal cell carcinoma on her buttocks that was recently removed.   PAST SURGICAL HISTORY:  1. Hysterectomy.  2. Appendectomy.  3. Cholecystectomy.   MEDICATIONS:  1. Zocor 20 mg.  2. Prevacid 40 mg.  3. Atenolol 25 mg.  4. Ecotrin.  5. Hyzaar 125 mg.  6. Allegra.  7. Glucosamine.  8. Glyburide 2.5 mg.  9. Tramadol 50 mg.   ALLERGIES:  CODEINE and NOVOCAINE.   PHYSICAL EXAMINATION:  GENERAL:  A 77 year old well-developed, well-  nourished white female in no acute distress.  HEENT:  Normocephalic.  NECK:  Supple, no lymphadenopathy noted.  CARDIAC:  Regular rate and rhythm.  CHEST:  Clear to auscultation bilaterally.  GASTROINTESTINAL:  Rotund, soft, nontender, positive bowel sounds.  GENITOURINARY:  Positive left flank pain.  EXTREMITIES:  Equal strength bilaterally, no edema noted.  NEUROLOGIC:  Grossly intact.   LABORATORY AND X-RAY DATA:  Lasix renal scan shows no significant  function  of left kidney.  Negative NMP-22.   PLAN:  I have discussed nephrectomy versus nephroureterectomy with the  patient.  Will schedule this at [facility].     Belinda [name], N.P.                         Sigmund I. Patsi Ramaker, M.D.    HB/MEDQ  D:  12/29/2003  T:  12/29/2003  Job:  [id number]

## 2011-04-27 ENCOUNTER — Ambulatory Visit (INDEPENDENT_AMBULATORY_CARE_PROVIDER_SITE_OTHER): Payer: Medicare Other | Admitting: Family Medicine

## 2011-04-27 DIAGNOSIS — N183 Chronic kidney disease, stage 3 unspecified: Secondary | ICD-10-CM

## 2011-04-27 DIAGNOSIS — I10 Essential (primary) hypertension: Secondary | ICD-10-CM

## 2011-04-27 DIAGNOSIS — H9190 Unspecified hearing loss, unspecified ear: Secondary | ICD-10-CM

## 2011-04-27 MED ORDER — FUROSEMIDE 20 MG PO TABS: 40.0000 mg | ORAL_TABLET | Freq: Every day | ORAL | Status: DC

## 2011-04-27 NOTE — Progress Notes (Signed)
  Subjective:    Patient ID: Belinda [name], female    DOB: [birth year], 77 y.o.   MRN: [id number]  HPI Pt. Has apt. With nephrology next week, she would like the lab reports to hand carry over.  Has a new BP monitoring device and on reviewing memory readings have ranged from 130/70 to 188/90.  The high numbers frighten her. She was not certain how to take the nifedipine and felt that she needed to take one 60 mg and one 30 mg (this was indeed the instructions given).  She is out of allopurinol and simvastatin and awaiting mail order to arrive.  She denies falling, diarrhea or pre-syncope.   Review of Systems  Constitutional: Negative for activity change, appetite change and fatigue.  HENT: Positive for hearing loss and ear pain.   Eyes: Positive for redness. Negative for pain.  Respiratory: Negative for cough and shortness of breath.   Cardiovascular: Negative for chest pain, palpitations and leg swelling.  Gastrointestinal: Positive for constipation, anal bleeding and rectal pain.  Neurological: Negative for syncope and light-headedness.  Psychiatric/Behavioral: Negative for dysphoric mood.       Objective:   Physical Exam  Constitutional: She is oriented to person, place, and time.       Looked well today  HENT:  Right Ear: External ear normal.  Left Ear: External ear normal.  Eyes: Right eye exhibits no discharge. Left eye exhibits no discharge.       Sclera non injected  Cardiovascular: Normal rate, regular rhythm, normal heart sounds and intact distal pulses.   Pulmonary/Chest: Effort normal and breath sounds normal. She has no rales.  Neurological: She is alert and oriented to person, place, and time.  Skin: Skin is warm and dry.  Psychiatric: She has a normal mood and affect.          Assessment & Plan:

## 2011-04-27 NOTE — Assessment & Plan Note (Addendum)
No evidence of cerumen , recommended simple amplification device from radio [facility]

## 2011-04-27 NOTE — Assessment & Plan Note (Signed)
Improved today.

## 2011-04-27 NOTE — Assessment & Plan Note (Signed)
Reinforced that she is indeed to be taking 90 mg daily of the nifedipine.  She can keep up with her BP measurements at home and if she brings in the meter as she did today we can review the memory.

## 2011-04-27 NOTE — Patient Instructions (Addendum)
You may take the simvastatin during the day When you restart the allopurinol if you have a gout flair call us [facility] _BP med, you should be taking a total of 90 mg daily Continue 40 mg of Lasix for now, your weight is up 3 pounds in the past 10 days Return to see Dr. Sheffield Slider in one month

## 2011-04-27 NOTE — Assessment & Plan Note (Signed)
Apt next week with renal Caryn Section), she was given her most recent note and labs to take with her.

## 2011-04-29 ENCOUNTER — Other Ambulatory Visit: Payer: Self-pay | Admitting: Family Medicine

## 2011-04-29 DIAGNOSIS — I2510 Atherosclerotic heart disease of native coronary artery without angina pectoris: Secondary | ICD-10-CM

## 2011-04-30 MED ORDER — CLOPIDOGREL BISULFATE 75 MG PO TABS: 75.0000 mg | ORAL_TABLET | Freq: Every day | ORAL | Status: DC

## 2011-04-30 MED ORDER — ALLOPURINOL 100 MG PO TABS: 100.0000 mg | ORAL_TABLET | Freq: Every day | ORAL | Status: DC

## 2011-04-30 NOTE — Telephone Encounter (Signed)
Patient called my home and left a message requesting a short term refill until her mail order prescriptions arrived. Phone busy when I called back.

## 2011-05-09 ENCOUNTER — Other Ambulatory Visit: Payer: Self-pay | Admitting: Family Medicine

## 2011-05-09 NOTE — Telephone Encounter (Signed)
Refill request

## 2011-05-10 ENCOUNTER — Other Ambulatory Visit: Payer: Self-pay | Admitting: Family Medicine

## 2011-05-10 NOTE — Telephone Encounter (Signed)
Refill request

## 2011-05-14 ENCOUNTER — Encounter: Payer: Self-pay | Admitting: Family Medicine

## 2011-05-14 NOTE — Progress Notes (Unsigned)
  Subjective:    Patient ID: Belinda [name], female    DOB: 1927/02/27, 77 y.o.   MRN: [id number]  HPI    Review of Systems     Objective:   Physical Exam        Assessment & Plan:  She was seen by Dr Caryn Section May 30,2012 who thought that her increased BUN/creatinine indicated that she was dry so he decreased the furosemide to 20 mg daily and increased her Metoprolol to 50 mg bid. He will see her back in July

## 2011-05-15 ENCOUNTER — Telehealth: Payer: Self-pay | Admitting: Family Medicine

## 2011-05-24 NOTE — Telephone Encounter (Signed)
x

## 2011-05-25 ENCOUNTER — Encounter: Payer: Self-pay | Admitting: Family Medicine

## 2011-05-25 ENCOUNTER — Ambulatory Visit (INDEPENDENT_AMBULATORY_CARE_PROVIDER_SITE_OTHER): Payer: Medicare Other | Admitting: Family Medicine

## 2011-05-25 VITALS — BP 146/52 | HR 58 | Ht 59.75 in | Wt 139.0 lb

## 2011-05-25 DIAGNOSIS — I10 Essential (primary) hypertension: Secondary | ICD-10-CM

## 2011-05-25 DIAGNOSIS — I2510 Atherosclerotic heart disease of native coronary artery without angina pectoris: Secondary | ICD-10-CM

## 2011-05-25 DIAGNOSIS — N183 Chronic kidney disease, stage 3 unspecified: Secondary | ICD-10-CM

## 2011-05-25 MED ORDER — FUROSEMIDE 20 MG PO TABS: 20.0000 mg | ORAL_TABLET | Freq: Every day | ORAL | Status: DC

## 2011-05-25 MED ORDER — METOPROLOL SUCCINATE ER 50 MG PO TB24: 50.0000 mg | ORAL_TABLET | Freq: Two times a day (BID) | ORAL | Status: DC

## 2011-05-25 NOTE — Progress Notes (Signed)
  Subjective:    Patient ID: Belinda [name], female    DOB: 1927-11-21, 77 y.o.   MRN: [id number]  HPIWhen she saw Dr Caryn Section on 5/29 he noted that her creatinine had risen on the furosemide 2-20 mg tabs daily so he decreased it to one tablet daily. Because her blood pressure was elevated he increased Metoprolol to 50 mg bid. She has made those changes and only notes increased ankle edema without PND or orthopnea. Mild DOE. She has been eating out more recently, but reports avoiding salt. She is scheduled to see Dr Caryn Section the end of July.   No recent chest pain and hasn't taken NTG since early June  Continues IBS, mainly constipation recently. Has been trying juice mixtures to help this.     Review of Systems  Cardiovascular: Positive for leg swelling.       Objective:   Physical Exam  Constitutional: She appears well-developed and well-nourished.  Cardiovascular: Normal rate, regular rhythm and normal heart sounds.   No murmur heard. Pulmonary/Chest: Effort normal and breath sounds normal.  Musculoskeletal: She exhibits edema.       2+ ankle edema  Psychiatric: She has a normal mood and affect. Her behavior is normal. Judgment normal.          Assessment & Plan:

## 2011-05-25 NOTE — Patient Instructions (Addendum)
Return to see Dr Sheffield Slider in August, since you will be seeing Dr Caryn Section the end of July.  Avoid eating too much salt  I'll send him a report of this visit.

## 2011-07-11 ENCOUNTER — Ambulatory Visit (INDEPENDENT_AMBULATORY_CARE_PROVIDER_SITE_OTHER): Payer: Medicare Other | Admitting: Family Medicine

## 2011-07-11 ENCOUNTER — Ambulatory Visit: Payer: Medicare Other | Admitting: Family Medicine

## 2011-07-11 VITALS — BP 131/78 | HR 58 | Temp 98.3°F | Ht 59.75 in | Wt 132.0 lb

## 2011-07-11 DIAGNOSIS — I10 Essential (primary) hypertension: Secondary | ICD-10-CM

## 2011-07-11 DIAGNOSIS — I2510 Atherosclerotic heart disease of native coronary artery without angina pectoris: Secondary | ICD-10-CM

## 2011-07-11 DIAGNOSIS — E1165 Type 2 diabetes mellitus with hyperglycemia: Secondary | ICD-10-CM

## 2011-07-11 MED ORDER — CLOPIDOGREL BISULFATE 75 MG PO TABS: 75.0000 mg | ORAL_TABLET | Freq: Every day | ORAL | Status: DC

## 2011-07-11 MED ORDER — CALCITRIOL 0.5 MCG PO CAPS: 0.5000 ug | ORAL_CAPSULE | ORAL | Status: DC

## 2011-07-11 NOTE — Patient Instructions (Addendum)
Return to see Dr Sheffield Slider in one month  Continue your medicines the same.   Your A1c is 6.2 which indicates that your blood sugar is well controlled.

## 2011-07-13 NOTE — Assessment & Plan Note (Addendum)
well controlled and no recent hypoglycemia

## 2011-07-13 NOTE — Assessment & Plan Note (Signed)
Fair control.

## 2011-07-13 NOTE — Assessment & Plan Note (Signed)
This or DJD may be causing her arthralgias

## 2011-07-13 NOTE — Progress Notes (Signed)
  Subjective:    Patient ID: Belinda [name], female    DOB: [birth year], 77 y.o.   MRN: [id number]  HPIShe is having more lower leg pains and other arthralgias, but no red or swollen joints. Sore in her tailbone and will see the chiropractor about that.  Limited range of motion left shoulder, had an injection by Dr Farris Has  Eating a protein shake in the AM to keep her weight up.   Uses a suppository qod and pushes on her buttock to move her bowels. Does apply vaginal pressure.   Nauseated an vomited 3 weeks ago and last week, but not since  Will see Dr Caryn Section on Aug. 31    Review of Systems     Objective:   Physical Exam  Constitutional: She appears well-developed and well-nourished.  Cardiovascular: Normal rate and regular rhythm.   Pulmonary/Chest: Effort normal and breath sounds normal.  Psychiatric:       Mildly anxious          Assessment & Plan:

## 2011-07-14 ENCOUNTER — Encounter: Payer: Self-pay | Admitting: Family Medicine

## 2011-07-14 NOTE — Assessment & Plan Note (Signed)
Discussed dialysis with Dr Caryn Section on 07/04/11 and has decided against it.

## 2011-07-19 ENCOUNTER — Telehealth: Payer: Self-pay | Admitting: Family Medicine

## 2011-07-19 NOTE — Telephone Encounter (Signed)
Pt has nausea and went to eye doc to day and thought it might be viral.  Wants to talk to nurse to see if she needs to come in or tough it out.

## 2011-07-19 NOTE — Telephone Encounter (Signed)
Patient c/o nausea, vomiting, headache and mild lower GI upset.  She has tried Tylenol for the HA and it has gotton some better but is still nauseated.  Told her it was probably viral and the best thing to so is treat the sx.  Advised her to only eat bland foods (crackers, toast, etc) and to drink ginger ale and broths until this passes.  If not any better by tomorrow call me back.  Patient agreeable.

## 2011-07-21 NOTE — Telephone Encounter (Signed)
Belinda [name] called back to speak with Health coach regarding phone call left for her.  Please call patient back.

## 2011-07-25 ENCOUNTER — Ambulatory Visit: Payer: Medicare Other | Admitting: Family Medicine

## 2011-07-26 NOTE — Telephone Encounter (Signed)
Spoke with Danella Sensing and scheduled AWV.

## 2011-07-28 ENCOUNTER — Encounter: Payer: Self-pay | Admitting: Home Health Services

## 2011-07-28 ENCOUNTER — Ambulatory Visit (INDEPENDENT_AMBULATORY_CARE_PROVIDER_SITE_OTHER): Payer: Medicare Other | Admitting: Home Health Services

## 2011-07-28 VITALS — BP 166/73 | HR 79 | Temp 98.2°F | Ht 59.0 in | Wt 129.6 lb

## 2011-07-28 DIAGNOSIS — Z0000 Encounter for general adult medical examination without abnormal findings: Secondary | ICD-10-CM

## 2011-07-28 NOTE — Progress Notes (Signed)
Patient here for annual wellness visit, patient reports: Risk Factors/Conditions needing evaluation or treatment: Pt scored 21 on PHQ9 which indicated depression.  Pt reports feeling down and is currently taking medicine daily.  Home Safety: Pt lives by self in 1 story home.  Pt reports having smoke detectors and adaptive equipment for bathroom. Other Information: Corrective lens: Pt has corrective lens for reading and visits eye doctor every 5 weeks.  Pt reports not having much vision in right eye.  Dentures: Pt has a partial. Memory: Pt reports some memory problems. Patient's Mini Mental Score (recorded in doc. flowsheet): 28  Balance/Gait:  Balance Abnormal Patient value  Sitting balance    Sit to stand x Must use arms  Attempts to arise    Immediate standing balance x >5 seconds  Standing balance    Nudge    Eyes closed- Romberg    Tandem stance x unable  Back lean    Neck Rotation    360 degree turn    Sitting down x Must use arms   Gait Abnormal Patient value  Initiation of gait    Step length-left    Step length-right    Step height-left    Step height-right    Step symmetry    Step continuity    Path deviation    Trunk movement    Walking stance x Bent over      [employer] Visit Requirements Recorded Today In  Medical, family, social history Past Medical, Family, Social History Section  Current providers Care team  Current medications Medications  Wt, BP, Ht, BMI Vital signs  Hearing assessment (welcome visit) Hearing/vision  Tobacco, alcohol, illicit drug use History  ADL Nurse Assessment  Depression Screening Nurse Assessment  Cognitive impairment Nurse Assessment  Mini Mental Status Document Flowsheet  Fall Risk Nurse Assessment  Home Safety Progress Note  End of Life Planning (welcome visit) Social Documentation  Medicare preventative services Progress Note  Risk factors/conditions needing evaluation/treatment Progress Note  Personalized  health advice Patient Instructions, goals, letter  Diet & Exercise Social Documentation  Emergency Contact Social Documentation  Seat Belts Social Documentation  Sun exposure/protection Social Documentation    Prevention Plan: Pt is up to date on recommended screenings.  Recommended Medicare Prevention Screenings Women over 37 Test For Frequency Date of Last- BOLD if needed  Breast Cancer 1-2 yrs 2/12  Cervical Cancer 1-3 yrs Not indicated  Colorectal Cancer 1-10 yrs 3/03  Osteoporosis once 11/01  Cholesterol 5 yrs 4/11  Diabetes yearly 8/12  HIV yearly declined  Influenza Shot yearly 10/11  Pneumonia Shot once 9/00  Zostavax Shot once 11/10

## 2011-07-28 NOTE — Patient Instructions (Signed)
1. Continue to take all your medicines daily. 2. Continue to try and eat small meals throughout the day.  Focus on fruits and vegetables if possible. 3. Try to move a little more around your house and try not to worry about the small stuff.

## 2011-07-29 ENCOUNTER — Encounter: Payer: Self-pay | Admitting: Home Health Services

## 2011-07-29 NOTE — Progress Notes (Signed)
  Subjective:    Patient ID: Belinda [name], female    DOB: Jan 08, 1927, 77 y.o.   MRN: [id number]  HPI    Review of Systems     Objective:   Physical Exam        Assessment & Plan:  I have reviewed this visit and discussed with Arlys John and agree with her documentation. WAH

## 2011-08-09 ENCOUNTER — Telehealth: Payer: Self-pay | Admitting: *Deleted

## 2011-08-09 ENCOUNTER — Telehealth: Payer: Self-pay | Admitting: Family Medicine

## 2011-08-09 NOTE — Telephone Encounter (Signed)
Is having trouble urinating and feels like her Kidneys are getting worse.  She has the urge to go but when she does, only a little comes out.  Her next appt is for 9/25 and Dr. Sheffield Slider doesn't have any openings until then.  She said that she would like to be called if there is a cancellation so I have added her to the "Wait List".  But, she wants to speak to Dr. Sheffield Slider.

## 2011-08-09 NOTE — Telephone Encounter (Signed)
Pt can be seen in [facility] tomorrow per Dr.Hale. Lorenda Hatchet, Eschol Auxier Waiting for pt to call back.

## 2011-08-09 NOTE — Telephone Encounter (Signed)
Called and left message: 'please call us and schedule appt with Dr.Hale tomorrow in [facility]' .Arlyss Repress

## 2011-08-09 NOTE — Telephone Encounter (Signed)
Called pt and left message 'please call us back. We can schedule you with a different doctor' .Arlyss Repress

## 2011-08-10 ENCOUNTER — Other Ambulatory Visit: Payer: Self-pay | Admitting: Family Medicine

## 2011-08-10 ENCOUNTER — Ambulatory Visit (INDEPENDENT_AMBULATORY_CARE_PROVIDER_SITE_OTHER): Payer: Medicare Other | Admitting: Family Medicine

## 2011-08-10 ENCOUNTER — Encounter: Payer: Self-pay | Admitting: Family Medicine

## 2011-08-10 VITALS — BP 153/69 | HR 65 | Temp 97.7°F | Wt 134.0 lb

## 2011-08-10 DIAGNOSIS — R5381 Other malaise: Secondary | ICD-10-CM

## 2011-08-10 DIAGNOSIS — R34 Anuria and oliguria: Secondary | ICD-10-CM

## 2011-08-10 DIAGNOSIS — R5383 Other fatigue: Secondary | ICD-10-CM

## 2011-08-10 DIAGNOSIS — R300 Dysuria: Secondary | ICD-10-CM

## 2011-08-10 DIAGNOSIS — F329 Major depressive disorder, single episode, unspecified: Secondary | ICD-10-CM

## 2011-08-10 DIAGNOSIS — N19 Unspecified kidney failure: Secondary | ICD-10-CM

## 2011-08-10 DIAGNOSIS — K5900 Constipation, unspecified: Secondary | ICD-10-CM | POA: Insufficient documentation

## 2011-08-10 LAB — POCT URINALYSIS DIPSTICK
Ketones, UA: NEGATIVE
Protein, UA: 100
Urobilinogen, UA: 0.2

## 2011-08-10 LAB — COMPREHENSIVE METABOLIC PANEL
ALT: 31 U/L (ref 0–35)
AST: 58 U/L — ABNORMAL HIGH (ref 0–37)
CO2: 23 mEq/L (ref 19–32)
Sodium: 141 mEq/L (ref 135–145)
Total Bilirubin: 0.3 mg/dL (ref 0.3–1.2)
Total Protein: 6.5 g/dL (ref 6.0–8.3)

## 2011-08-10 LAB — CBC
MCH: 31.6 pg (ref 26.0–34.0)
Platelets: 204 10*3/uL (ref 150–400)
RBC: 3.35 MIL/uL — ABNORMAL LOW (ref 3.87–5.11)
RDW: 14.3 % (ref 11.5–15.5)
WBC: 11.2 10*3/uL — ABNORMAL HIGH (ref 4.0–10.5)

## 2011-08-10 LAB — FERRITIN: Ferritin: 391 ng/mL — ABNORMAL HIGH (ref 10–291)

## 2011-08-10 LAB — IRON AND TIBC: %SAT: 27 % (ref 20–55)

## 2011-08-10 MED ORDER — POLYETHYLENE GLYCOL 3350 17 GM/SCOOP PO POWD: 17.0000 g | Freq: Every day | ORAL | Status: AC

## 2011-08-10 NOTE — Assessment & Plan Note (Signed)
Pt encouraged to see therapist.  Pt to return in 2-4 weeks to see PCP.  PCP to f/up on this issue and consider starting antidepressant.  Might want to consider getting PHQ-9 to get an objective measure to see how she responds to therapy.

## 2011-08-10 NOTE — Progress Notes (Signed)
  Subjective:    Patient ID: Belinda [name], female    DOB: 1927/09/17, 77 y.o.   MRN: [id number]  HPI 1) urination problem Pt reports decreased urine volume and frequency during past 2-3 days.  States that only a few drops will come out when she tries to urinate.  Has decreased freqency.  Has urge to urinate and feels like she can't empty her bladder.  Unable to urinate on arrival today.  Did a urine cath and got out 112 cc.    2)constipation- Endorses hard stool balls with bowel movement x 2 weeks.  Has had "a few" bowel movements in past 2 weeks but they were all hard. Has had problems with this in past.  Has used stool softeners but they caused gi upset and she didn't like the way they made her feel.  States she has tried some fiber drinks but they made her nauseated to drink them.    3) fatigue States she doesn't have any energy to do anything.  Has been going on for months.  States that she learned about B12 deficiency on Dr. Neil Crouch and would like to have this checked.    3)stress/depression? -lots of life/family stressors, has not seen therapist.  Problems with sleep, + fatigue, unclear if problems with concentration and focus.  Pt endorses increased tearfulness during past few months. Has lots of chronic medical problems.    Review of Systems    as per above.  No CP.   No SOB.  No rash Objective:   Physical Exam  Constitutional: She is oriented to person, place, and time. She appears well-developed and well-nourished.  Cardiovascular: Normal rate, regular rhythm and normal heart sounds.   No murmur heard. Pulmonary/Chest: Effort normal and breath sounds normal. No respiratory distress. She has no wheezes.  Abdominal: Soft. She exhibits no distension. There is no tenderness. There is no rebound and no guarding.  Musculoskeletal: She exhibits edema.       Trace lower ext edema  Neurological: She is alert and oriented to person, place, and time.  Psychiatric:       Tearful during today's  appointment while talking about life stressors.          Assessment & Plan:

## 2011-08-10 NOTE — Assessment & Plan Note (Signed)
Will treat with miralax daily. Hold for loose stools.

## 2011-08-10 NOTE — Assessment & Plan Note (Signed)
Most likely decreased urination due to decreased po intake of fluids.  Have encouraged pt to drink more fluids.  If  true decreased urine output would suspect that there would be an increase in creatinine indicating acute worsening in renal function.  Will get creatinine to assess renal function.  Also sensation to urinate and inability to void may be due to constipation.  Will treat constipation with miralax.

## 2011-08-10 NOTE — Patient Instructions (Addendum)
Take miralax daily as directed.  Do not take if loose stools.  Drink more fluids- 8oz glass of fluids- 6  X per day. Do not take extra doses of lasix.   Consider seeing therapist to discuss life stressors.   [facility]- [phone]  Dr. Pascal Lux- [phone] here at [facility]

## 2011-08-10 NOTE — Assessment & Plan Note (Signed)
B12, CBC and Iron studies drawn.  Will contact pt with results.

## 2011-08-12 LAB — CULTURE, URINE COMPREHENSIVE

## 2011-08-14 ENCOUNTER — Telehealth: Payer: Self-pay | Admitting: Family Medicine

## 2011-08-14 DIAGNOSIS — N184 Chronic kidney disease, stage 4 (severe): Secondary | ICD-10-CM

## 2011-08-14 DIAGNOSIS — N390 Urinary tract infection, site not specified: Secondary | ICD-10-CM | POA: Insufficient documentation

## 2011-08-14 DIAGNOSIS — F329 Major depressive disorder, single episode, unspecified: Secondary | ICD-10-CM

## 2011-08-14 MED ORDER — CEPHALEXIN 500 MG PO CAPS: 500.0000 mg | ORAL_CAPSULE | Freq: Two times a day (BID) | ORAL | Status: DC

## 2011-08-14 MED ORDER — MIRTAZAPINE 15 MG PO TABS: 7.5000 mg | ORAL_TABLET | Freq: Every day | ORAL | Status: DC

## 2011-08-14 NOTE — Assessment & Plan Note (Signed)
Will treat as infection the 10K E. Coli with Cephalexin

## 2011-08-14 NOTE — Assessment & Plan Note (Signed)
Her decreased urination and poor appetite likely are due to her CRF and uremia

## 2011-08-14 NOTE — Telephone Encounter (Signed)
I called her with lab results. She is trying to increase fluids, but appetite remains poor.  Is sleeping poorly and is tearful all the time. No burning on urination, but she has to strain to void. She is moving her bowels by taking Ex-lax and Miralax.

## 2011-08-14 NOTE — Telephone Encounter (Signed)
[facility] called regarding phone call from Ms. Rhetta Mura.  She is requesting that you call them with rx for antibiotic and sleep aid

## 2011-08-14 NOTE — Assessment & Plan Note (Signed)
Not sleeping and sad, will start Mirtazapine hopefully to help appetite

## 2011-08-14 NOTE — Telephone Encounter (Signed)
I already did

## 2011-08-15 ENCOUNTER — Telehealth: Payer: Self-pay | Admitting: *Deleted

## 2011-08-15 DIAGNOSIS — N390 Urinary tract infection, site not specified: Secondary | ICD-10-CM

## 2011-08-15 DIAGNOSIS — F329 Major depressive disorder, single episode, unspecified: Secondary | ICD-10-CM

## 2011-08-15 MED ORDER — MIRTAZAPINE 15 MG PO TABS: 7.5000 mg | ORAL_TABLET | Freq: Every day | ORAL | Status: DC

## 2011-08-15 MED ORDER — CEPHALEXIN 500 MG PO CAPS: 500.0000 mg | ORAL_CAPSULE | Freq: Two times a day (BID) | ORAL | Status: AC

## 2011-08-15 NOTE — Telephone Encounter (Signed)
Arlyss Repress CMA spoke with patient and she wanted the Remeran and antibiotic sent to the local pharmacy. This was sent in electronically. I called the mail order and cancelled these two Rx there.

## 2011-08-23 ENCOUNTER — Other Ambulatory Visit: Payer: Self-pay | Admitting: Family Medicine

## 2011-08-23 DIAGNOSIS — I2510 Atherosclerotic heart disease of native coronary artery without angina pectoris: Secondary | ICD-10-CM

## 2011-08-23 MED ORDER — ISOSORBIDE MONONITRATE 30 MG PO TB24: 30.0000 mg | ORAL_TABLET | ORAL | Status: DC

## 2011-08-25 LAB — URINALYSIS, ROUTINE W REFLEX MICROSCOPIC
Bilirubin Urine: NEGATIVE
Glucose, UA: NEGATIVE
Nitrite: NEGATIVE
Specific Gravity, Urine: 1.016
pH: 5

## 2011-08-25 LAB — URINE MICROSCOPIC-ADD ON

## 2011-08-25 LAB — DIFFERENTIAL
Lymphocytes Relative: 10 — ABNORMAL LOW
Lymphs Abs: 1.2
Neutrophils Relative %: 81 — ABNORMAL HIGH

## 2011-08-25 LAB — I-STAT 8, (EC8 V) (CONVERTED LAB)
Acid-base deficit: 6 — ABNORMAL HIGH
BUN: 56 — ABNORMAL HIGH
Bicarbonate: 19.8 — ABNORMAL LOW
HCT: 39
Operator id: 284251
Sodium: 142
TCO2: 21
pCO2, Ven: 38.5 — ABNORMAL LOW

## 2011-08-25 LAB — HEPATIC FUNCTION PANEL
ALT: 15
Albumin: 3.5
Alkaline Phosphatase: 88
Total Protein: 6.8

## 2011-08-25 LAB — CBC
HCT: 36.7
Platelets: 177
WBC: 12.3 — ABNORMAL HIGH

## 2011-08-25 LAB — POCT I-STAT CREATININE: Creatinine, Ser: 2.6 — ABNORMAL HIGH

## 2011-08-25 LAB — URINE CULTURE

## 2011-08-29 ENCOUNTER — Encounter: Payer: Self-pay | Admitting: Family Medicine

## 2011-08-29 ENCOUNTER — Ambulatory Visit (INDEPENDENT_AMBULATORY_CARE_PROVIDER_SITE_OTHER): Payer: Medicare Other | Admitting: Family Medicine

## 2011-08-29 VITALS — BP 139/69 | HR 61 | Wt 134.0 lb

## 2011-08-29 DIAGNOSIS — I10 Essential (primary) hypertension: Secondary | ICD-10-CM

## 2011-08-29 DIAGNOSIS — E118 Type 2 diabetes mellitus with unspecified complications: Secondary | ICD-10-CM

## 2011-08-29 DIAGNOSIS — E1165 Type 2 diabetes mellitus with hyperglycemia: Secondary | ICD-10-CM

## 2011-08-29 DIAGNOSIS — F411 Generalized anxiety disorder: Secondary | ICD-10-CM

## 2011-08-29 DIAGNOSIS — F329 Major depressive disorder, single episode, unspecified: Secondary | ICD-10-CM

## 2011-08-29 DIAGNOSIS — Z23 Encounter for immunization: Secondary | ICD-10-CM

## 2011-08-29 DIAGNOSIS — N390 Urinary tract infection, site not specified: Secondary | ICD-10-CM

## 2011-08-29 DIAGNOSIS — E785 Hyperlipidemia, unspecified: Secondary | ICD-10-CM

## 2011-08-29 DIAGNOSIS — F419 Anxiety disorder, unspecified: Secondary | ICD-10-CM

## 2011-08-29 DIAGNOSIS — N184 Chronic kidney disease, stage 4 (severe): Secondary | ICD-10-CM

## 2011-08-29 DIAGNOSIS — K469 Unspecified abdominal hernia without obstruction or gangrene: Secondary | ICD-10-CM

## 2011-08-29 MED ORDER — BUSPIRONE HCL 15 MG PO TABS: 15.0000 mg | ORAL_TABLET | ORAL | Status: DC

## 2011-08-29 NOTE — Progress Notes (Signed)
  Subjective:    Patient ID: Belinda [name], female    DOB: 05-Jun-1927, 77 y.o.   MRN: [id number]  HPI Belinda [name] is sleeping better on the mirtazapine gives her more energy in the daytime. Appetite is improved. She is crying less  She is hoarse voice and sore throat and would like ENT evaluation. It is worse at night. She also has nasal congestion. She is happy to go to [facility] where she had seen Dr. Christella Hartigan in  the past  She hasn't taken nitroglycerin in the past 3 weeks,not having had chest pain.  She requests her mail-in medications be sent for 90 days. She is confused when they call her regarding refills.   The injection she gets in her eyes is Lucentis 0.5 shot on the left  He is significant pain in her left flank in the area of the wound hernia at times.  No constipation on the MiraLAX but had 10-12 episodes of diarrhea yesterday  She will go back to see Dr. Caryn Section in November    Review of Systems     Objective:   Physical Exam  Constitutional: She is oriented to person, place, and time.  Cardiovascular: Normal rate and regular rhythm.   Pulmonary/Chest: Effort normal and breath sounds normal. She has no wheezes.  Abdominal: Soft.       Large left flank and mid back bulge around the well healed surgical scar from nephrectomy  Brown nodule of epidemal debris adhered in umbilicus  Neurological: She is oriented to person, place, and time.       Normal anxious, but pleasant interactions.          Assessment & Plan:

## 2011-08-29 NOTE — Patient Instructions (Signed)
Now that you taking Mirtazapine, try stopping Buspar  Return fasting in a week for lab tests  Return to see Dr Sheffield Slider in one month.  Thank you for the fish

## 2011-08-30 NOTE — Assessment & Plan Note (Signed)
Will try to wean off Buspar since on Mirtazapine

## 2011-08-30 NOTE — Assessment & Plan Note (Signed)
Improved sleep and spirits on Mirtazapine

## 2011-08-30 NOTE — Assessment & Plan Note (Signed)
Controlled, but not ideal 

## 2011-08-30 NOTE — Assessment & Plan Note (Signed)
Controlled without recent hypoglycemia

## 2011-08-30 NOTE — Assessment & Plan Note (Signed)
Intermittently painful, but no signs of incarceration

## 2011-08-30 NOTE — Assessment & Plan Note (Signed)
Symptoms resolved 

## 2011-08-30 NOTE — Assessment & Plan Note (Signed)
Appetite and weight stable despite rising BUN/creat

## 2011-09-04 ENCOUNTER — Telehealth: Payer: Self-pay | Admitting: Family Medicine

## 2011-09-04 ENCOUNTER — Encounter: Payer: Self-pay | Admitting: Family Medicine

## 2011-09-04 ENCOUNTER — Other Ambulatory Visit: Payer: Self-pay | Admitting: Family Medicine

## 2011-09-04 DIAGNOSIS — N184 Chronic kidney disease, stage 4 (severe): Secondary | ICD-10-CM

## 2011-09-04 DIAGNOSIS — F329 Major depressive disorder, single episode, unspecified: Secondary | ICD-10-CM

## 2011-09-04 DIAGNOSIS — I10 Essential (primary) hypertension: Secondary | ICD-10-CM

## 2011-09-04 DIAGNOSIS — J3081 Allergic rhinitis due to animal (cat) (dog) hair and dander: Secondary | ICD-10-CM

## 2011-09-04 DIAGNOSIS — I2510 Atherosclerotic heart disease of native coronary artery without angina pectoris: Secondary | ICD-10-CM

## 2011-09-04 DIAGNOSIS — K219 Gastro-esophageal reflux disease without esophagitis: Secondary | ICD-10-CM

## 2011-09-04 DIAGNOSIS — F419 Anxiety disorder, unspecified: Secondary | ICD-10-CM

## 2011-09-04 MED ORDER — FUROSEMIDE 20 MG PO TABS: 20.0000 mg | ORAL_TABLET | Freq: Every day | ORAL | Status: DC

## 2011-09-04 MED ORDER — PANTOPRAZOLE SODIUM 40 MG PO TBEC: 40.0000 mg | DELAYED_RELEASE_TABLET | Freq: Every day | ORAL | Status: DC

## 2011-09-04 MED ORDER — CETIRIZINE HCL 10 MG PO CAPS: 1.0000 | ORAL_CAPSULE | Freq: Every day | ORAL | Status: DC

## 2011-09-04 MED ORDER — BUSPIRONE HCL 15 MG PO TABS: 15.0000 mg | ORAL_TABLET | ORAL | Status: DC

## 2011-09-04 MED ORDER — CALCITRIOL 0.5 MCG PO CAPS: 0.5000 ug | ORAL_CAPSULE | ORAL | Status: DC

## 2011-09-04 MED ORDER — SIMVASTATIN 40 MG PO TABS: 40.0000 mg | ORAL_TABLET | Freq: Every day | ORAL | Status: DC

## 2011-09-04 MED ORDER — NIFEDIPINE ER 90 MG PO TB24: 90.0000 mg | ORAL_TABLET | Freq: Every day | ORAL | Status: DC

## 2011-09-04 MED ORDER — METOPROLOL SUCCINATE ER 50 MG PO TB24: 50.0000 mg | ORAL_TABLET | Freq: Two times a day (BID) | ORAL | Status: DC

## 2011-09-04 MED ORDER — ALLOPURINOL 100 MG PO TABS: 100.0000 mg | ORAL_TABLET | Freq: Every day | ORAL | Status: DC

## 2011-09-04 MED ORDER — ISOSORBIDE MONONITRATE 30 MG PO TB24
30.0000 mg | ORAL_TABLET | ORAL | Status: DC
Start: 2011-09-04 — End: 2012-12-19

## 2011-09-04 MED ORDER — TRAMADOL HCL 50 MG PO TABS: 50.0000 mg | ORAL_TABLET | Freq: Four times a day (QID) | ORAL | Status: DC | PRN

## 2011-09-04 MED ORDER — CLOPIDOGREL BISULFATE 75 MG PO TABS: 75.0000 mg | ORAL_TABLET | Freq: Every day | ORAL | Status: DC

## 2011-09-04 MED ORDER — MIRTAZAPINE 15 MG PO TABS: 7.5000 mg | ORAL_TABLET | Freq: Every day | ORAL | Status: DC

## 2011-09-04 NOTE — Telephone Encounter (Signed)
Pt request to have all of her medications sent to her pharmacy [facility] [address]. Request a 3 month supply. Will fwd. To Dr.Hale for refills .Arlyss Repress

## 2011-09-04 NOTE — Telephone Encounter (Signed)
Scheduled appt with ENT Dr. Jearld Fenton Oct 5th at 2:30pm. Fax [phone].  Called pt and informed of the appt. Will fwd. To Dr.Hale for letter. Pt asked again about sending all of her medicines to [facility]. I told the pt that I will send a message to Dr.Hale and she can check on her meds tomorrow. Pt agreed. Lorenda Hatchet, Renato Battles

## 2011-09-04 NOTE — Telephone Encounter (Signed)
Belinda [name] is calling about the many med refills that she needs.  She thought he was going to send several to [facility].  She said that [facility] did not get the one for Buspar. She also needs to know when the ENT appt is.

## 2011-09-04 NOTE — Telephone Encounter (Signed)
Called pt. Pt wants to go to [facility]. For hoarseness. Sore throat. Will schedule appt for pt.

## 2011-09-05 ENCOUNTER — Ambulatory Visit (INDEPENDENT_AMBULATORY_CARE_PROVIDER_SITE_OTHER): Payer: Medicare Other | Admitting: Family Medicine

## 2011-09-05 ENCOUNTER — Encounter: Payer: Self-pay | Admitting: Family Medicine

## 2011-09-05 ENCOUNTER — Other Ambulatory Visit: Payer: Medicare Other

## 2011-09-05 DIAGNOSIS — E785 Hyperlipidemia, unspecified: Secondary | ICD-10-CM

## 2011-09-05 DIAGNOSIS — R1310 Dysphagia, unspecified: Secondary | ICD-10-CM

## 2011-09-05 DIAGNOSIS — N184 Chronic kidney disease, stage 4 (severe): Secondary | ICD-10-CM

## 2011-09-05 LAB — COMPREHENSIVE METABOLIC PANEL
ALT: 10 U/L (ref 0–35)
CO2: 22 mEq/L (ref 19–32)
Calcium: 10.3 mg/dL (ref 8.4–10.5)
Chloride: 108 mEq/L (ref 96–112)
Sodium: 141 mEq/L (ref 135–145)
Total Protein: 6.1 g/dL (ref 6.0–8.3)

## 2011-09-05 LAB — LIPID PANEL: Cholesterol: 152 mg/dL (ref 0–200)

## 2011-09-05 NOTE — Assessment & Plan Note (Addendum)
Sore throat, chronic, no evidence for acute illness.  Also with neck tightness and dyspnea.  No evidence of pulmonary disease on exam and able to perform ambulatory pulse ox remaining at 99% throughout although became fatigued with quick recovery.  She declines EKG, states this does not feel like her typical symptoms of angina.  Advised to monitor symptoms closely. May use guaifenesin for congestion that she feels "needs to come up"  Will return if no improvement or worsening.

## 2011-09-05 NOTE — Progress Notes (Signed)
LABS DRAWN FOR CMET,LIPID CNEWSOME

## 2011-09-05 NOTE — Progress Notes (Signed)
  Subjective:    Patient ID: Belinda [name], female    DOB: [birth year], 77 y.o.   MRN: [id number]  HPI Here for work in appt.    Main concern is sore throat, occuring for several weeks to months per patient.  Has appt with ENT in 3 days.  Overall patient concerned as she feels more tired, dyspneic at rest as well as when walking.  Feels neck is tight, but no chest pain.  States her daughter just had to come to doctor and get antibiotics for her asthma.  Has been using albuterol and one dose of NTG with relief.    Some cough, non-productive.  Does not feel like angina per patient.  No fever.  Has a long history of decreased PO intake per patient due to decreased appetite and dysphagia.  No edema.    I have reviewed patient's  PMH, FH, and Social history and Medications as related to this visit.   Review of Systems see hpi above       Objective:   Physical Exam  GEN: Alert & Oriented, No acute distress, anxious appearing, frequent belching HEENT: [place]/AT. EOMI, PERRLA, no conjunctival injection or scleral icterus.  Bilateral tympanic membranes intact without erythema or effusion.  .  Nares without edema or rhinorrhea.  Oropharynx is without erythema or exudates.  No anterior or posterior cervical lymphadenopathy.  Anterior neck and chest wall tender to palpation. CV:  Regular Rate & Rhythm, no murmur, NO JVD Respiratory:  Normal work of breathing, CTAB Abd:  + BS, soft, no tenderness to palpation Ext: no pre-tibial edema      Assessment & Plan:

## 2011-09-05 NOTE — Patient Instructions (Signed)
Use guaifenesin (mucinex) for your congestion. If you continue to have tightness or trouble breathing, please schedule a follow-up

## 2011-09-19 LAB — DIFFERENTIAL
Basophils Absolute: 0
Basophils Relative: 0
Eosinophils Absolute: 0.6
Lymphocytes Relative: 14
Monocytes Absolute: 0.9 — ABNORMAL HIGH
Neutro Abs: 11.7 — ABNORMAL HIGH

## 2011-09-19 LAB — CBC
HCT: 40
Hemoglobin: 13.5
MCHC: 33.8
MCHC: 34.1
Platelets: 191
Platelets: 232
RDW: 15.2 — ABNORMAL HIGH
RDW: 15.4 — ABNORMAL HIGH
RDW: 15.7 — ABNORMAL HIGH
WBC: 12.8 — ABNORMAL HIGH

## 2011-09-19 LAB — BASIC METABOLIC PANEL
BUN: 37 — ABNORMAL HIGH
BUN: 51 — ABNORMAL HIGH
CO2: 23
Calcium: 8.3 — ABNORMAL LOW
Chloride: 107
Chloride: 109
Creatinine, Ser: 1.93 — ABNORMAL HIGH
GFR calc non Af Amer: 25 — ABNORMAL LOW
Glucose, Bld: 118 — ABNORMAL HIGH
Glucose, Bld: 151 — ABNORMAL HIGH
Glucose, Bld: 61 — ABNORMAL LOW
Potassium: 5
Potassium: 5.2 — ABNORMAL HIGH
Potassium: 5.3 — ABNORMAL HIGH
Sodium: 140

## 2011-09-19 LAB — URINALYSIS, ROUTINE W REFLEX MICROSCOPIC
Hgb urine dipstick: NEGATIVE
Nitrite: NEGATIVE
Specific Gravity, Urine: 1.008
Urobilinogen, UA: 0.2

## 2011-09-19 LAB — CULTURE, BLOOD (ROUTINE X 2)
Culture: NO GROWTH
Culture: NO GROWTH

## 2011-09-19 LAB — APTT: aPTT: 28

## 2011-09-19 LAB — URINE MICROSCOPIC-ADD ON

## 2011-09-19 LAB — URINE CULTURE

## 2011-09-20 LAB — CBC
HCT: 24.1 — ABNORMAL LOW
HCT: 26.7 — ABNORMAL LOW
HCT: 27 — ABNORMAL LOW
HCT: 27.6 — ABNORMAL LOW
HCT: 30.4 — ABNORMAL LOW
HCT: 34 — ABNORMAL LOW
Hemoglobin: 10.2 — ABNORMAL LOW
Hemoglobin: 10.3 — ABNORMAL LOW
Hemoglobin: 11.5 — ABNORMAL LOW
Hemoglobin: 11.5 — ABNORMAL LOW
Hemoglobin: 8.1 — ABNORMAL LOW
Hemoglobin: 9.1 — ABNORMAL LOW
Hemoglobin: 9.2 — ABNORMAL LOW
Hemoglobin: 9.3 — ABNORMAL LOW
Hemoglobin: 9.4 — ABNORMAL LOW
Hemoglobin: 9.9 — ABNORMAL LOW
MCHC: 33.5
MCHC: 33.7
MCHC: 33.7
MCHC: 33.8
MCHC: 33.8
MCHC: 33.8
MCHC: 34.1
MCHC: 34.2
MCHC: 34.4
MCHC: 34.8
MCHC: 34.9
MCV: 86.6
MCV: 88.6
MCV: 89
MCV: 90.4
MCV: 90.5
MCV: 90.8
MCV: 90.9
MCV: 91
Platelets: 128 — ABNORMAL LOW
Platelets: 130 — ABNORMAL LOW
Platelets: 142 — ABNORMAL LOW
Platelets: 145 — ABNORMAL LOW
Platelets: 147 — ABNORMAL LOW
Platelets: 158
RBC: 2.67 — ABNORMAL LOW
RBC: 2.95 — ABNORMAL LOW
RBC: 2.96 — ABNORMAL LOW
RBC: 3.03 — ABNORMAL LOW
RBC: 3.05 — ABNORMAL LOW
RBC: 3.2 — ABNORMAL LOW
RBC: 3.33 — ABNORMAL LOW
RBC: 3.35 — ABNORMAL LOW
RBC: 3.65 — ABNORMAL LOW
RBC: 3.76 — ABNORMAL LOW
RBC: 3.93
RDW: 12.7
RDW: 12.8
RDW: 12.9
RDW: 12.9
RDW: 12.9
RDW: 13.2
RDW: 15.7 — ABNORMAL HIGH
WBC: 10.3
WBC: 11.4 — ABNORMAL HIGH
WBC: 11.6 — ABNORMAL HIGH
WBC: 12.2 — ABNORMAL HIGH
WBC: 13.8 — ABNORMAL HIGH
WBC: 16.3 — ABNORMAL HIGH
WBC: 9.8

## 2011-09-20 LAB — LIPID PANEL
HDL: 33 — ABNORMAL LOW
Total CHOL/HDL Ratio: 4.9
VLDL: 11

## 2011-09-20 LAB — I-STAT 8, (EC8 V) (CONVERTED LAB)
Chloride: 112
Glucose, Bld: 127 — ABNORMAL HIGH
Potassium: 5.1
TCO2: 22
pCO2, Ven: 42.4 — ABNORMAL LOW
pH, Ven: 7.305 — ABNORMAL HIGH

## 2011-09-20 LAB — DIFFERENTIAL
Basophils Absolute: 0
Basophils Relative: 0
Basophils Relative: 0
Eosinophils Absolute: 0.5
Eosinophils Relative: 5
Lymphocytes Relative: 11 — ABNORMAL LOW
Lymphocytes Relative: 18
Lymphs Abs: 1.1
Monocytes Absolute: 0.9 — ABNORMAL HIGH
Monocytes Absolute: 1 — ABNORMAL HIGH
Monocytes Relative: 9
Monocytes Relative: 9
Neutro Abs: 7.7
Neutro Abs: 7.8 — ABNORMAL HIGH
Neutrophils Relative %: 68
Neutrophils Relative %: 75

## 2011-09-20 LAB — POCT CARDIAC MARKERS
CKMB, poc: 1.8
CKMB, poc: 2
Myoglobin, poc: 137
Operator id: 282201
Operator id: 282201
Troponin i, poc: 0.12 — ABNORMAL HIGH

## 2011-09-20 LAB — OCCULT BLOOD X 1 CARD TO LAB, STOOL: Fecal Occult Bld: NEGATIVE

## 2011-09-20 LAB — BASIC METABOLIC PANEL
BUN: 35 — ABNORMAL HIGH
CO2: 23
CO2: 26
CO2: 26
Calcium: 8.5
Calcium: 8.6
Calcium: 8.9
Calcium: 9.1
Chloride: 113 — ABNORMAL HIGH
Creatinine, Ser: 1.91 — ABNORMAL HIGH
Creatinine, Ser: 1.93 — ABNORMAL HIGH
Creatinine, Ser: 2.25 — ABNORMAL HIGH
GFR calc Af Amer: 25 — ABNORMAL LOW
GFR calc Af Amer: 25 — ABNORMAL LOW
GFR calc Af Amer: 30 — ABNORMAL LOW
GFR calc Af Amer: 31 — ABNORMAL LOW
GFR calc Af Amer: 32 — ABNORMAL LOW
GFR calc Af Amer: 34 — ABNORMAL LOW
GFR calc non Af Amer: 21 — ABNORMAL LOW
GFR calc non Af Amer: 21 — ABNORMAL LOW
GFR calc non Af Amer: 27 — ABNORMAL LOW
GFR calc non Af Amer: 28 — ABNORMAL LOW
Glucose, Bld: 118 — ABNORMAL HIGH
Glucose, Bld: 137 — ABNORMAL HIGH
Potassium: 3.5
Potassium: 4.3
Sodium: 135
Sodium: 136
Sodium: 138
Sodium: 141

## 2011-09-20 LAB — BASIC METABOLIC PANEL WITH GFR
BUN: 29 — ABNORMAL HIGH
BUN: 33 — ABNORMAL HIGH
BUN: 34 — ABNORMAL HIGH
CO2: 26
CO2: 27
CO2: 28
Calcium: 8.2 — ABNORMAL LOW
Calcium: 8.3 — ABNORMAL LOW
Calcium: 8.7
Chloride: 107
Chloride: 109
Chloride: 99
Creatinine, Ser: 1.71 — ABNORMAL HIGH
Creatinine, Ser: 1.86 — ABNORMAL HIGH
Creatinine, Ser: 2.03 — ABNORMAL HIGH
GFR calc non Af Amer: 24 — ABNORMAL LOW
GFR calc non Af Amer: 26 — ABNORMAL LOW
GFR calc non Af Amer: 29 — ABNORMAL LOW
Glucose, Bld: 138 — ABNORMAL HIGH
Glucose, Bld: 180 — ABNORMAL HIGH
Glucose, Bld: 58 — ABNORMAL LOW
Potassium: 4.1
Potassium: 4.2
Potassium: 4.3
Sodium: 136
Sodium: 140
Sodium: 141

## 2011-09-20 LAB — TYPE AND SCREEN
ABO/RH(D): O POS
Antibody Screen: NEGATIVE

## 2011-09-20 LAB — IRON AND TIBC
Iron: 17 — ABNORMAL LOW
Saturation Ratios: 8 — ABNORMAL LOW
TIBC: 224 — ABNORMAL LOW
UIBC: 207

## 2011-09-20 LAB — FERRITIN: Ferritin: 146 (ref 10–291)

## 2011-09-20 LAB — COMPREHENSIVE METABOLIC PANEL
Alkaline Phosphatase: 94
BUN: 42 — ABNORMAL HIGH
Chloride: 111
Creatinine, Ser: 2.02 — ABNORMAL HIGH
GFR calc non Af Amer: 24 — ABNORMAL LOW
Glucose, Bld: 103 — ABNORMAL HIGH
Potassium: 4.7
Total Bilirubin: 0.6

## 2011-09-20 LAB — B-NATRIURETIC PEPTIDE (CONVERTED LAB): Pro B Natriuretic peptide (BNP): 131 — ABNORMAL HIGH

## 2011-09-20 LAB — RETICULOCYTES
RBC.: 2.69 — ABNORMAL LOW
Retic Count, Absolute: 86.1
Retic Ct Pct: 3.2 — ABNORMAL HIGH

## 2011-09-20 LAB — PREPARE RBC (CROSSMATCH)

## 2011-09-20 LAB — CARDIAC PANEL(CRET KIN+CKTOT+MB+TROPI)
Relative Index: INVALID
Troponin I: 0.51
Troponin I: 0.6

## 2011-09-20 LAB — POCT I-STAT CREATININE: Operator id: 282201

## 2011-09-20 LAB — HEMOGLOBIN AND HEMATOCRIT, BLOOD: HCT: 24.3 — ABNORMAL LOW

## 2011-09-20 LAB — HEMOGLOBIN A1C: Hgb A1c MFr Bld: 6.1

## 2011-09-20 LAB — PROTIME-INR: Prothrombin Time: 13.6

## 2011-09-26 ENCOUNTER — Ambulatory Visit (INDEPENDENT_AMBULATORY_CARE_PROVIDER_SITE_OTHER): Payer: Medicare Other | Admitting: Family Medicine

## 2011-09-26 ENCOUNTER — Encounter: Payer: Self-pay | Admitting: Family Medicine

## 2011-09-26 VITALS — BP 137/70 | HR 67 | Ht 59.0 in | Wt 138.5 lb

## 2011-09-26 DIAGNOSIS — F32A Depression, unspecified: Secondary | ICD-10-CM

## 2011-09-26 DIAGNOSIS — K5900 Constipation, unspecified: Secondary | ICD-10-CM

## 2011-09-26 DIAGNOSIS — E119 Type 2 diabetes mellitus without complications: Secondary | ICD-10-CM

## 2011-09-26 DIAGNOSIS — F329 Major depressive disorder, single episode, unspecified: Secondary | ICD-10-CM

## 2011-09-26 DIAGNOSIS — J383 Other diseases of vocal cords: Secondary | ICD-10-CM | POA: Insufficient documentation

## 2011-09-26 DIAGNOSIS — IMO0002 Reserved for concepts with insufficient information to code with codable children: Secondary | ICD-10-CM

## 2011-09-26 DIAGNOSIS — N184 Chronic kidney disease, stage 4 (severe): Secondary | ICD-10-CM

## 2011-09-26 DIAGNOSIS — E1165 Type 2 diabetes mellitus with hyperglycemia: Secondary | ICD-10-CM

## 2011-09-26 DIAGNOSIS — I10 Essential (primary) hypertension: Secondary | ICD-10-CM

## 2011-09-26 LAB — POCT GLYCOSYLATED HEMOGLOBIN (HGB A1C): Hemoglobin A1C: 6.4

## 2011-09-26 NOTE — Patient Instructions (Signed)
Return to see Dr Sheffield Slider in 2 months. I'll send a note with your meds and blood pressure to Dr Caryn Section  Use up the Nifedipine up adding up to 90 mg until you can replace them with one 90 mg tab daily  Try taking the PEG (Miralax) a half dose to even out your BM's but not get constipated.

## 2011-09-26 NOTE — Assessment & Plan Note (Signed)
She'll soon get labs for follow up with Dr Caryn Section

## 2011-09-26 NOTE — Progress Notes (Signed)
  Subjective:    Patient ID: Belinda [name], female    DOB: 1927/10/09, 77 y.o.   MRN: [id number]  HPI she says that the ear nose and throat doctor said that her a hoarse voice was due to aging changes in her vocal cords. He did recommend that she followup with her gastroenterologist. She continues to be active most recently traveling to [employer].  Her appetite is fairly good. No recent urinary symptoms. Her bowels due to loose and she sometimes can have accidents and she wears a pad. Fundus of falling asleep in the daytime.  Random blood sugars have been in the 136 range.   Applies antibiotic ointment her umbilicus because of the nodule she feels there. Relates to having a nail in rock taken out of her umbilicus some years ago that apparently had been there for years.   Review of Systems     Objective:   Physical Exam she's alert and well-appearing not anxious. Chest clear Heart regular rhythm without murmur Left flank bulge in the area of prior surgery is the same Umbilicus has a half centimeter round nodule in the center which is firm and not inflamed. Ankles no edema        Assessment & Plan:

## 2011-09-26 NOTE — Assessment & Plan Note (Signed)
well controlled, but not overly

## 2011-09-26 NOTE — Assessment & Plan Note (Signed)
Suggested she try half doses of PEG to even out bowel movements

## 2011-09-26 NOTE — Assessment & Plan Note (Signed)
Seems improved ?

## 2011-09-26 NOTE — Assessment & Plan Note (Signed)
Fair control.

## 2011-10-11 ENCOUNTER — Telehealth: Payer: Self-pay | Admitting: Family Medicine

## 2011-10-11 DIAGNOSIS — N184 Chronic kidney disease, stage 4 (severe): Secondary | ICD-10-CM

## 2011-10-11 MED ORDER — EPOETIN ALFA 20000 UNIT/ML IJ SOLN: 20000.0000 [IU] | INTRAMUSCULAR | Status: DC

## 2011-10-11 NOTE — Telephone Encounter (Signed)
Procrit started by Dr Caryn Section 10/05/11

## 2011-10-12 ENCOUNTER — Other Ambulatory Visit (HOSPITAL_COMMUNITY): Payer: Self-pay | Admitting: *Deleted

## 2011-10-16 ENCOUNTER — Encounter (HOSPITAL_COMMUNITY)
Admission: RE | Admit: 2011-10-16 | Discharge: 2011-10-16 | Disposition: A | Discharge status: Home / Self Care | Payer: Medicare Other | Source: Ambulatory Visit | Attending: Nephrology | Admitting: Nephrology

## 2011-10-16 DIAGNOSIS — N039 Chronic nephritic syndrome with unspecified morphologic changes: Secondary | ICD-10-CM | POA: Insufficient documentation

## 2011-10-16 DIAGNOSIS — D631 Anemia in chronic kidney disease: Secondary | ICD-10-CM | POA: Insufficient documentation

## 2011-10-16 DIAGNOSIS — N184 Chronic kidney disease, stage 4 (severe): Secondary | ICD-10-CM | POA: Insufficient documentation

## 2011-10-16 LAB — POCT HEMOGLOBIN-HEMACUE: Hemoglobin: 10.4 g/dL — ABNORMAL LOW (ref 12.0–15.0)

## 2011-10-16 MED ORDER — EPOETIN ALFA 20000 UNIT/ML IJ SOLN
INTRAMUSCULAR | Status: AC
  Filled 2011-10-16: qty 1

## 2011-10-16 MED ORDER — EPOETIN ALFA 10000 UNIT/ML IJ SOLN: 20000.0000 [IU] | INTRAMUSCULAR | Status: DC

## 2011-10-17 MED FILL — Epoetin Alfa Inj 20000 Unit/ML: INTRAMUSCULAR | Qty: 1 | Status: AC

## 2011-10-18 ENCOUNTER — Encounter: Payer: Self-pay | Admitting: Home Health Services

## 2011-10-18 ENCOUNTER — Telehealth: Payer: Self-pay | Admitting: Family Medicine

## 2011-10-18 NOTE — Telephone Encounter (Signed)
Patient states that she was experiencing dizzy episodes yesterday afternoon and even passed out once while walking from the bathroom to her bed.  She had to call her son to come help her up.  Patient went to same day admissions yesterday to receive a procrit injection but does not think the dizziness was related to that.  Her BP medications were adjusted by Dr. Sheffield Slider last month.  Patient c/o stiffness and soreness in her left shoulder and neck since the fall.  She has remained in the bed today and so far has not had any dizziness but she is only getting up to go tot he bathroom.  Advised her to drink plenty of fluids in case this is hypotension dn to move from a lying to a standing position very slowly.  Gave her an appointment with Dr. Earnest Bailey for tomorrow to check for postural hypotension and to check her shoulder and neck.

## 2011-10-18 NOTE — Telephone Encounter (Signed)
I called her on the phone. She is still sore, but hasn't had diarrhea since the initial episode. Has hot spells with temperature up to 98.6 (usually 97) She promised to bring her meds to the visit with Dr Earnest Bailey.

## 2011-10-18 NOTE — Telephone Encounter (Signed)
Her son had brought a note by Nov 12th reporting that she had gotten her shot (Aranesp?) at short stay. She left at 1:10 and had lunch with a friend then went shopping. She had a dizzy spell in the store, felt hot, lips numb. Her friend took her home and on the way to the bathroom she nearly passed out. Her son and grand daughter helped her to bed and cleaned up diarrheal stool around 3:10 PM. At 5 PM her blood pressure was 134/59, heart rate 65 and cbg 124. At 6:15 she took Tylenol for painful left arm. Her blood pressure earlier had been 189/60.   It should be noted that she has had occasional episodes of near syncope followed by diarrhea. Evaluations for carcinoid, etc have been negative.

## 2011-10-18 NOTE — Telephone Encounter (Signed)
Please call patient regarding issue.  Pt is quite anxious about condition.  She left a note for Dr. Sheffield Slider the other day.  Haven't heard anything yet

## 2011-10-19 ENCOUNTER — Ambulatory Visit (INDEPENDENT_AMBULATORY_CARE_PROVIDER_SITE_OTHER): Payer: Medicare Other | Admitting: Family Medicine

## 2011-10-19 ENCOUNTER — Encounter: Payer: Self-pay | Admitting: Family Medicine

## 2011-10-19 DIAGNOSIS — G904 Autonomic dysreflexia: Secondary | ICD-10-CM

## 2011-10-19 NOTE — Patient Instructions (Signed)
You are doing the right things by being careful and taking good care of your bruises  Keep going out and going the things you enjoy  Make follow-up with Dr. Sheffield Slider

## 2011-10-19 NOTE — Progress Notes (Signed)
  Subjective:    Patient ID: Belinda [name], female    DOB: 1927-08-03, 77 y.o.   MRN: [id number]  HPI Patient presents for a same day appointment to evaluate fall which occurred yesterday.  Patient states that she received her regularly scheduled Epogen injection around noon yesterday. She then went out to lunch with a girlfriend. She began to feel faint after lunch approximately 1 to 2 PM. She asked her girlfriend to take her home. She had an episode of diarrhea. Shortly after that episode around 3 PM she states that she was walking to her bed she fell down hitting her right forearm On a piec which has caused her some right neck strain.e of furniture. She did not hit her head as she states she held it up which has caused her some neck strain. This has resolved with the liberal use of an over-the-counter cream and ice   Review of Systems please see history of present illness.  Patient states that she also has a cough and dysphagia. The cough has resolved after she was able to clear her chest. The dysphagia is chronic and she states it is currently being worked up by GI   past medical history was reviewed and it is noted that this is a recurrent symptom that has been previously worked up with no etiology found     Objective:   Physical Exam  GEN: Alert & Oriented, No acute distress CV:  Regular Rate & Rhythm, no murmur Respiratory:  Normal work of breathing, CTAB Arm:  Left arm with small 1-2 cm ecchymoses above elbow.  No pain on palpation.       Assessment & Plan:

## 2011-10-19 NOTE — Assessment & Plan Note (Signed)
Had another episode yesterday.  Relatively mild and had long period of time to be able to predict symptoms and find a safe environment when she feels dizzy. No injuries, no acute changes in symptoms.  Advised to continue taking her medications as prescribed, adequate rest and hydration, and continue to go out on enjoyable activities and return home when she feels these episodes coming on.  Follow-up with PCP.

## 2011-10-30 ENCOUNTER — Encounter (HOSPITAL_COMMUNITY): Payer: Medicare Other | Attending: Nephrology

## 2011-10-30 DIAGNOSIS — D631 Anemia in chronic kidney disease: Secondary | ICD-10-CM | POA: Insufficient documentation

## 2011-10-30 DIAGNOSIS — N039 Chronic nephritic syndrome with unspecified morphologic changes: Secondary | ICD-10-CM | POA: Insufficient documentation

## 2011-10-30 DIAGNOSIS — N184 Chronic kidney disease, stage 4 (severe): Secondary | ICD-10-CM | POA: Insufficient documentation

## 2011-11-03 ENCOUNTER — Telehealth: Payer: Self-pay | Admitting: Home Health Services

## 2011-11-03 NOTE — Telephone Encounter (Signed)
Spoke with Danella Sensing.  Scheduled AWV 12/11 and follow up appointment in [place] [facility] for 12/20.   Pt recently had blood pressure taken 105/75 and was concerned about how low it was.    I informed her I would let PCP know and if there was concern we would be in touch other wise we will see her at next appointment.

## 2011-11-10 ENCOUNTER — Encounter (HOSPITAL_COMMUNITY)
Admission: RE | Admit: 2011-11-10 | Discharge: 2011-11-10 | Disposition: A | Discharge status: Home / Self Care | Payer: Medicare Other | Source: Ambulatory Visit | Attending: Nephrology | Admitting: Nephrology

## 2011-11-10 DIAGNOSIS — N184 Chronic kidney disease, stage 4 (severe): Secondary | ICD-10-CM | POA: Insufficient documentation

## 2011-11-10 DIAGNOSIS — N039 Chronic nephritic syndrome with unspecified morphologic changes: Secondary | ICD-10-CM | POA: Insufficient documentation

## 2011-11-10 DIAGNOSIS — D631 Anemia in chronic kidney disease: Secondary | ICD-10-CM | POA: Insufficient documentation

## 2011-11-10 LAB — IRON AND TIBC
Iron: 100 ug/dL (ref 42–135)
TIBC: 206 ug/dL — ABNORMAL LOW (ref 250–470)
UIBC: 106 ug/dL — ABNORMAL LOW (ref 125–400)

## 2011-11-10 LAB — POCT HEMOGLOBIN-HEMACUE: Hemoglobin: 10.1 g/dL — ABNORMAL LOW (ref 12.0–15.0)

## 2011-11-10 MED ORDER — EPOETIN ALFA 20000 UNIT/ML IJ SOLN
INTRAMUSCULAR | Status: AC
  Administered 2011-11-10: 20000 [IU]
  Filled 2011-11-10: qty 1

## 2011-11-10 MED ORDER — EPOETIN ALFA 10000 UNIT/ML IJ SOLN: 20000.0000 [IU] | INTRAMUSCULAR | Status: DC

## 2011-11-13 ENCOUNTER — Encounter (HOSPITAL_COMMUNITY): Payer: Medicare Other

## 2011-11-14 ENCOUNTER — Ambulatory Visit: Payer: Medicare Other | Admitting: Home Health Services

## 2011-11-17 NOTE — Telephone Encounter (Signed)
I've called her home and mobile phone and left a message on the latter.

## 2011-11-23 ENCOUNTER — Ambulatory Visit (INDEPENDENT_AMBULATORY_CARE_PROVIDER_SITE_OTHER): Payer: Medicare Other | Admitting: Family Medicine

## 2011-11-23 VITALS — BP 154/60 | HR 59 | Temp 97.8°F | Ht 59.0 in | Wt 136.0 lb

## 2011-11-23 DIAGNOSIS — I10 Essential (primary) hypertension: Secondary | ICD-10-CM

## 2011-11-23 DIAGNOSIS — D631 Anemia in chronic kidney disease: Secondary | ICD-10-CM

## 2011-11-23 DIAGNOSIS — K589 Irritable bowel syndrome without diarrhea: Secondary | ICD-10-CM

## 2011-11-23 DIAGNOSIS — N189 Chronic kidney disease, unspecified: Secondary | ICD-10-CM

## 2011-11-23 DIAGNOSIS — N039 Chronic nephritic syndrome with unspecified morphologic changes: Secondary | ICD-10-CM

## 2011-11-23 MED ORDER — METOPROLOL SUCCINATE ER 100 MG PO TB24: 100.0000 mg | ORAL_TABLET | Freq: Every day | ORAL | Status: DC

## 2011-11-23 MED ORDER — NIFEDIPINE ER 90 MG PO TB24: 90.0000 mg | ORAL_TABLET | Freq: Every day | ORAL | Status: DC

## 2011-11-23 NOTE — Assessment & Plan Note (Signed)
blood pressure at home 124/74 per patient

## 2011-11-23 NOTE — Patient Instructions (Signed)
Let me know if there are any problems getting your new medications  Please return to see Dr Sheffield Slider in one month.   For the dry mouth, keep the humidifier going in your bedroom. Consider artificial saliva if needed to keep your throat moist.

## 2011-11-23 NOTE — Assessment & Plan Note (Signed)
Labs done for Dr Caryn Section. She will continue erythropoietin

## 2011-11-23 NOTE — Progress Notes (Signed)
  Subjective:    Patient ID: Belinda [name], female    DOB: Jan 05, 1927, 77 y.o.   MRN: [id number]  HPIshe has not been using the MiraLAX regularly because she is actually having diarrhea or several days every 2-3 weeks. She's able to control this using intermittent loperamide. She has seen a small amount of blood at the end of these episodes and she's concerned about having an ulcer. She's not having abdominal pain in the epigastrium.   She receives her erythropoietin injections every 2 weeks. This is expensive for her and she asked that we give the injections in the [facility]. I informed her that that is not something that we're able to do  She had blood drawn recently for follow-up visit with Dr. Caryn Section and she'll have an early January appointment with him.  She continues to be concerned about her son's use of alcohol. She says that he is irritable. He continues not to sleep well. There is a lot of family distention with the children of his deceased wife. He has also been having conflicts with dinner for his daughter with his deceased wife. Several of these children have bipolar diagnoses. He was present for part of this discussion. He admits to drinking 5 or 6 beers daily but denies that is causing her any problems. As I have in the past I informed her him that this level of alcohol intake is likely to cause damage to his body.      Review of Systems     Objective:   Physical Exam  Constitutional: She is oriented to person, place, and time. She appears well-developed and well-nourished.  Cardiovascular: Normal rate and regular rhythm.   No murmur heard. Pulmonary/Chest: Effort normal and breath sounds normal. No respiratory distress. She has no wheezes. She has no rales.  Abdominal: Soft. Bowel sounds are normal. She exhibits no mass. There is no guarding.       Left posterior flank hernia in the area of her nephrectomy scar  Genitourinary:       Rectal exam a few external  hemorrhoidal tags No internal masses brown Hemoccult negative stool  Neurological: She is alert and oriented to person, place, and time. She has normal reflexes.  Psychiatric:       Mildly anxious as is typical for her          Assessment & Plan:

## 2011-11-23 NOTE — Assessment & Plan Note (Signed)
Likely cause of her intermittent diarrhea. No sign of GI bleeding on hemacult today

## 2011-11-29 ENCOUNTER — Encounter (HOSPITAL_COMMUNITY): Payer: Medicare Other

## 2011-12-11 ENCOUNTER — Other Ambulatory Visit (HOSPITAL_COMMUNITY): Payer: Self-pay | Admitting: *Deleted

## 2011-12-11 ENCOUNTER — Encounter (HOSPITAL_COMMUNITY)
Admission: RE | Admit: 2011-12-11 | Discharge: 2011-12-11 | Disposition: A | Discharge status: Home / Self Care | Payer: Medicare Other | Source: Ambulatory Visit | Attending: Nephrology | Admitting: Nephrology

## 2011-12-11 DIAGNOSIS — D631 Anemia in chronic kidney disease: Secondary | ICD-10-CM | POA: Insufficient documentation

## 2011-12-11 DIAGNOSIS — N184 Chronic kidney disease, stage 4 (severe): Secondary | ICD-10-CM | POA: Insufficient documentation

## 2011-12-11 LAB — IRON AND TIBC
Saturation Ratios: 51 % (ref 20–55)
UIBC: 105 ug/dL — ABNORMAL LOW (ref 125–400)

## 2011-12-11 MED ORDER — EPOETIN ALFA 10000 UNIT/ML IJ SOLN: 20000.0000 [IU] | INTRAMUSCULAR | Status: DC

## 2011-12-11 MED ORDER — EPOETIN ALFA 20000 UNIT/ML IJ SOLN
INTRAMUSCULAR | Status: AC
  Administered 2011-12-11: 20000 [IU] via SUBCUTANEOUS
  Filled 2011-12-11: qty 1

## 2011-12-25 ENCOUNTER — Encounter (HOSPITAL_COMMUNITY)
Admission: RE | Admit: 2011-12-25 | Discharge: 2011-12-25 | Disposition: A | Discharge status: Home / Self Care | Payer: Medicare Other | Source: Ambulatory Visit | Attending: Nephrology | Admitting: Nephrology

## 2011-12-25 LAB — RENAL FUNCTION PANEL
Albumin: 3.3 g/dL — ABNORMAL LOW (ref 3.5–5.2)
BUN: 62 mg/dL — ABNORMAL HIGH (ref 6–23)
CO2: 18 mEq/L — ABNORMAL LOW (ref 19–32)
Calcium: 9.6 mg/dL (ref 8.4–10.5)
Chloride: 110 mEq/L (ref 96–112)
Creatinine, Ser: 3.28 mg/dL — ABNORMAL HIGH (ref 0.50–1.10)
GFR calc Af Amer: 14 mL/min — ABNORMAL LOW (ref >90)
GFR calc non Af Amer: 12 mL/min — ABNORMAL LOW (ref >90)
Glucose, Bld: 170 mg/dL — ABNORMAL HIGH (ref 70–99)
Phosphorus: 4.4 mg/dL (ref 2.3–4.6)
Potassium: 4.4 mEq/L (ref 3.5–5.1)
Sodium: 140 mEq/L (ref 135–145)

## 2011-12-25 LAB — POCT HEMOGLOBIN-HEMACUE: Hemoglobin: 10.5 g/dL — ABNORMAL LOW (ref 12.0–15.0)

## 2011-12-25 LAB — IRON AND TIBC: Saturation Ratios: 60 % — ABNORMAL HIGH (ref 20–55)

## 2011-12-25 MED ORDER — EPOETIN ALFA 20000 UNIT/ML IJ SOLN
INTRAMUSCULAR | Status: AC
  Administered 2011-12-25: 20000 [IU] via SUBCUTANEOUS
  Filled 2011-12-25: qty 1

## 2011-12-25 MED ORDER — EPOETIN ALFA 10000 UNIT/ML IJ SOLN: 20000.0000 [IU] | INTRAMUSCULAR | Status: DC

## 2011-12-26 ENCOUNTER — Ambulatory Visit (INDEPENDENT_AMBULATORY_CARE_PROVIDER_SITE_OTHER): Payer: Medicare Other | Admitting: Family Medicine

## 2011-12-26 VITALS — BP 172/82 | HR 56 | Ht 59.0 in | Wt 133.0 lb

## 2011-12-26 DIAGNOSIS — I10 Essential (primary) hypertension: Secondary | ICD-10-CM

## 2011-12-26 DIAGNOSIS — I2510 Atherosclerotic heart disease of native coronary artery without angina pectoris: Secondary | ICD-10-CM

## 2011-12-26 DIAGNOSIS — N039 Chronic nephritic syndrome with unspecified morphologic changes: Secondary | ICD-10-CM

## 2011-12-26 DIAGNOSIS — N189 Chronic kidney disease, unspecified: Secondary | ICD-10-CM

## 2011-12-26 DIAGNOSIS — K219 Gastro-esophageal reflux disease without esophagitis: Secondary | ICD-10-CM

## 2011-12-26 DIAGNOSIS — E1165 Type 2 diabetes mellitus with hyperglycemia: Secondary | ICD-10-CM

## 2011-12-26 DIAGNOSIS — E119 Type 2 diabetes mellitus without complications: Secondary | ICD-10-CM

## 2011-12-26 LAB — POCT GLYCOSYLATED HEMOGLOBIN (HGB A1C): Hemoglobin A1C: 6

## 2011-12-26 LAB — PTH, INTACT AND CALCIUM
Calcium, Total (PTH): 9.6 mg/dL (ref 8.4–10.5)
PTH: 101.3 pg/mL — ABNORMAL HIGH (ref 14.0–72.0)

## 2011-12-26 NOTE — Patient Instructions (Addendum)
Your A1c is 6.0 so you don't need diabetes medicine  Take extra pain medicine if your shoulder is worse before it gets better  Please return to see Dr Sheffield Slider in 2 months.

## 2011-12-27 ENCOUNTER — Encounter: Payer: Self-pay | Admitting: Family Medicine

## 2011-12-27 NOTE — Progress Notes (Signed)
  Subjective:    Patient ID: Belinda [name], female    DOB: February 06, 1927, 78 y.o.   MRN: [id number]  HPI Blood pressure varies in was 122/62 with outpatient treatment yesterday. She got her erythropoietin injection  Her fasting blood sugar was 176 this morning. No hypoglycemic episodes  She has a spell of diarrhea about every 3-4 day. Followup with Dr. Madilyn Fireman for gastroenterology since he's not on her insurance plan.   Took nitroglycerin x1 last week  Her back is sore and she is numb in her fingers at times  The callus on her left foot is not painful   Review of Systems     Objective:   Physical Exam  Constitutional: She is oriented to person, place, and time. She appears well-developed and well-nourished.       Pale-appearing  Cardiovascular: Normal rate and regular rhythm.   Pulmonary/Chest: Effort normal and breath sounds normal.  Abdominal: Soft. She exhibits no distension and no mass. There is no rebound and no guarding.       Large bulge in left posterior flank or around nephrectomy scar not tender   Neurological: She is alert and oriented to person, place, and time.  Skin: Skin is warm and dry.       4 mm superficial callus left foot under her second metatarsal head  Decreased sensation tips of toes          Assessment & Plan:

## 2011-12-27 NOTE — Assessment & Plan Note (Signed)
Not adequately controlled. She was confused about her medications. It was her Nifedipine that needed refilled. The pharmacist will have it ready for her. Erythropoietin may be increasing blood pressure.

## 2011-12-27 NOTE — Assessment & Plan Note (Signed)
Infrequent need for NTG

## 2011-12-27 NOTE — Assessment & Plan Note (Signed)
She continues EPO

## 2011-12-27 NOTE — Assessment & Plan Note (Signed)
Dr Madilyn Fireman apparently not on her insurance list, so she may have to change to another

## 2011-12-27 NOTE — Assessment & Plan Note (Signed)
Well controlled off medications.

## 2012-01-01 ENCOUNTER — Other Ambulatory Visit: Payer: Self-pay | Admitting: Family Medicine

## 2012-01-01 ENCOUNTER — Telehealth: Payer: Self-pay | Admitting: Family Medicine

## 2012-01-01 NOTE — Telephone Encounter (Signed)
Refill request

## 2012-01-01 NOTE — Telephone Encounter (Signed)
Belinda [name] is sched for 3/5 with you and need to have rxs sent to pharmacy when due around that time.

## 2012-01-04 ENCOUNTER — Telehealth: Payer: Self-pay | Admitting: Family Medicine

## 2012-01-04 NOTE — Telephone Encounter (Signed)
Belinda [name] is calling because her toenail is coming off after dropping a can of hairspray.  It looks like a bruise under the nail.  Since she is diabetic, she wants to know how to treat it.

## 2012-01-04 NOTE — Telephone Encounter (Signed)
Called pt. Advised to be seen, if she develops any signs of infection. Pt agreed. Verbalized understanding. Lorenda Hatchet, Renato Battles

## 2012-01-07 NOTE — Telephone Encounter (Signed)
I hope my mother will be discharged tomorrow AM so I can make it back for that clinic. The snow in [place] is beautiful, until you have to drive in it.

## 2012-01-07 NOTE — Telephone Encounter (Signed)
Good advice. Fortunately, these rarely become infected.

## 2012-01-07 NOTE — Telephone Encounter (Signed)
I'm hoping to be back for that clinic. Hopefully my mother can be discharged tomorrow AM. The snow in [place] is beautiful, until you have to drive in it.

## 2012-01-08 ENCOUNTER — Encounter (HOSPITAL_COMMUNITY)
Admission: RE | Admit: 2012-01-08 | Discharge: 2012-01-08 | Disposition: A | Discharge status: Home / Self Care | Payer: Medicare Other | Source: Ambulatory Visit | Attending: Nephrology | Admitting: Nephrology

## 2012-01-08 DIAGNOSIS — D631 Anemia in chronic kidney disease: Secondary | ICD-10-CM | POA: Insufficient documentation

## 2012-01-08 DIAGNOSIS — N184 Chronic kidney disease, stage 4 (severe): Secondary | ICD-10-CM | POA: Insufficient documentation

## 2012-01-08 LAB — IRON AND TIBC
Iron: 145 ug/dL — ABNORMAL HIGH (ref 42–135)
Saturation Ratios: 59 % — ABNORMAL HIGH (ref 20–55)
UIBC: 100 ug/dL — ABNORMAL LOW (ref 125–400)

## 2012-01-08 MED ORDER — EPOETIN ALFA 10000 UNIT/ML IJ SOLN: 20000.0000 [IU] | INTRAMUSCULAR | Status: DC

## 2012-01-08 NOTE — Progress Notes (Signed)
Pt hemocue checked in the lab and resulted 13.4, rechecked because of increase from last visit said and it resulted 14, then checked again on another machine and resulted 13.9.  Called the office and spoke with [facility] for Dr. Caryn Section and stated the pt was questioning the results and was willing to have them redone and sent to the main lab and I was told by [facility] whatever the pt decided she was most comfortable with was fine.  The pt decided to not have her labs redrawn and would come back in 2 weeks and see where she was then.

## 2012-01-17 ENCOUNTER — Other Ambulatory Visit (HOSPITAL_COMMUNITY): Payer: Self-pay | Admitting: *Deleted

## 2012-01-22 ENCOUNTER — Encounter (HOSPITAL_COMMUNITY): Payer: Medicare Other

## 2012-01-24 ENCOUNTER — Encounter: Payer: Self-pay | Admitting: Family Medicine

## 2012-02-06 ENCOUNTER — Encounter: Payer: Self-pay | Admitting: Family Medicine

## 2012-02-06 ENCOUNTER — Ambulatory Visit (INDEPENDENT_AMBULATORY_CARE_PROVIDER_SITE_OTHER): Payer: Medicare Other | Admitting: Family Medicine

## 2012-02-06 VITALS — BP 118/42 | HR 60 | Ht 59.0 in | Wt 135.9 lb

## 2012-02-06 DIAGNOSIS — N184 Chronic kidney disease, stage 4 (severe): Secondary | ICD-10-CM

## 2012-02-06 DIAGNOSIS — F329 Major depressive disorder, single episode, unspecified: Secondary | ICD-10-CM

## 2012-02-06 DIAGNOSIS — I10 Essential (primary) hypertension: Secondary | ICD-10-CM

## 2012-02-06 DIAGNOSIS — F458 Other somatoform disorders: Secondary | ICD-10-CM

## 2012-02-06 MED ORDER — METOPROLOL SUCCINATE ER 100 MG PO TB24: ORAL_TABLET | ORAL | Status: DC

## 2012-02-06 MED ORDER — MIRTAZAPINE 15 MG PO TABS: 7.5000 mg | ORAL_TABLET | Freq: Every day | ORAL | Status: DC

## 2012-02-06 MED ORDER — POLYETHYLENE GLYCOL 3350 17 G PO PACK: 17.0000 g | PACK | Freq: Every day | ORAL | Status: DC

## 2012-02-06 NOTE — Patient Instructions (Signed)
Decrease the Metoprolol to one half of a 100 mg tablet daily  Take the same small amount of polyethylene glycol (Miralax) daily to keep bowel movements soft.  Please return to see Dr Sheffield Slider in 1 month

## 2012-02-06 NOTE — Progress Notes (Signed)
  Subjective:    Patient ID: Belinda [name], female    DOB: 04-10-1927, 78 y.o.   MRN: [id number]  HPIThe cardiolgist suggested that her metoprolol because her blood pressure is on the low side  She almost passes out on the toilet with bowel movements. Her stools are hard and she has to massage her right buttock and strain to move them.   She is frequently tremulous.   Again burping frequently    Review of Systems     Objective:   Physical Exam  Cardiovascular: Normal rate and regular rhythm.   Pulmonary/Chest: Effort normal and breath sounds normal.  Abdominal: Soft. Bowel sounds are normal. She exhibits no distension and no mass. There is no tenderness. There is no rebound and no guarding.       left flank to CVA bulge unchanged.  Musculoskeletal: She exhibits no edema.  Psychiatric:       Mildly anxious as usual          Assessment & Plan:

## 2012-02-06 NOTE — Assessment & Plan Note (Signed)
Reminded how this promotes burping and reflux

## 2012-02-06 NOTE — Assessment & Plan Note (Signed)
Over controlled. Beta blocker plus Valsalva may be causing near syncope

## 2012-02-06 NOTE — Assessment & Plan Note (Signed)
Will have labs done next month before seeing Dr Caryn Section.

## 2012-02-08 ENCOUNTER — Other Ambulatory Visit: Payer: Self-pay | Admitting: Family Medicine

## 2012-02-08 NOTE — Telephone Encounter (Signed)
Refill request

## 2012-02-12 ENCOUNTER — Encounter (HOSPITAL_COMMUNITY)
Admission: RE | Admit: 2012-02-12 | Discharge: 2012-02-12 | Disposition: A | Discharge status: Home / Self Care | Payer: Medicare Other | Source: Ambulatory Visit | Attending: Nephrology | Admitting: Nephrology

## 2012-02-12 DIAGNOSIS — N184 Chronic kidney disease, stage 4 (severe): Secondary | ICD-10-CM | POA: Insufficient documentation

## 2012-02-12 DIAGNOSIS — D631 Anemia in chronic kidney disease: Secondary | ICD-10-CM | POA: Insufficient documentation

## 2012-02-12 LAB — IRON AND TIBC
Saturation Ratios: 34 % (ref 20–55)
TIBC: 217 ug/dL — ABNORMAL LOW (ref 250–470)

## 2012-02-12 LAB — POCT HEMOGLOBIN-HEMACUE: Hemoglobin: 10.8 g/dL — ABNORMAL LOW (ref 12.0–15.0)

## 2012-02-12 MED ORDER — EPOETIN ALFA 20000 UNIT/ML IJ SOLN
INTRAMUSCULAR | Status: AC
  Administered 2012-02-12: 20000 [IU] via SUBCUTANEOUS
  Filled 2012-02-12: qty 1

## 2012-02-12 MED ORDER — EPOETIN ALFA 10000 UNIT/ML IJ SOLN: 20000.0000 [IU] | INTRAMUSCULAR | Status: DC

## 2012-02-20 ENCOUNTER — Ambulatory Visit (INDEPENDENT_AMBULATORY_CARE_PROVIDER_SITE_OTHER): Payer: Medicare Other | Admitting: Family Medicine

## 2012-02-20 ENCOUNTER — Encounter: Payer: Self-pay | Admitting: Family Medicine

## 2012-02-20 VITALS — BP 138/62 | HR 60 | Temp 98.6°F | Ht 59.0 in | Wt 135.0 lb

## 2012-02-20 DIAGNOSIS — D649 Anemia, unspecified: Secondary | ICD-10-CM

## 2012-02-20 DIAGNOSIS — G904 Autonomic dysreflexia: Secondary | ICD-10-CM

## 2012-02-20 DIAGNOSIS — K589 Irritable bowel syndrome without diarrhea: Secondary | ICD-10-CM

## 2012-02-20 LAB — POCT HEMOGLOBIN: Hemoglobin: 10.8 g/dL — AB (ref 12.2–16.2)

## 2012-02-20 NOTE — Assessment & Plan Note (Signed)
Today diarrhea predominant.  No evidence of acute infection.  Discussed use of imodium prn.  Will follow-up with PCP for further discussion about symptoms and best way to manage them with her lifestyle (church and going out)

## 2012-02-20 NOTE — Patient Instructions (Signed)
Alternate immodium and miralax as needed to control your bowel symptoms  Make follow-up with Dr. Sheffield Slider to discuss in more detail

## 2012-02-20 NOTE — Assessment & Plan Note (Signed)
Symptoms describes today not significantly different than previous, no evidence of hypotension today and hgb is at baseline.  Advised continued monitoring, follow-up with PCP.

## 2012-02-20 NOTE — Progress Notes (Signed)
  Subjective:    Patient ID: Belinda [name], female    DOB: 1927-04-17, 78 y.o.   MRN: [id number]  HPI 78 yo here for work in appt for dizziness and constipation/diarrhea  Dizziness:  Hx of autonomic dysreflexia.  States has continued dizzy episodes and wants to know what is causing them.  No significant change in meds, activities.  No change in frequency or character of events.  Describes last episode was this week- was at [employer] with a friend.  Became tired after walking and called out to friend that she felt faint.  Store Production designer, theatre/television/film helped her to a chair.  She states she slumped over onto the table for several minutes.  Notes worsening diarrhea and accidents but not related to dizzy episodes.  She is concerned about her blood pressure- states her family took it for her and she got a reading of 120/47.    Diarrhea:  History of IBS.  Distressed over increasing irregularity of her bowel movements.  At some times she has constipation and this week she has had several episodes of loose bowel movements that come on with a sudden urge and she is not able to get to the bathroom in time.  NO abdominal pain, fever, nausea, or emesis.  Feels stools look darker than usual.  I have reviewed patient's  PMH, FH, and Social history and Medications as related to this visit.  Review of Systemssee HPI.  No chest pain, dyspnea.     Objective:   Physical Exam GEN: Alert & Oriented, No acute distress, well appearing. HEENT: EOMI, PERRLA CV:  Regular Rate & Rhythm, no murmur Respiratory:  Normal work of breathing, CTAB Abd:  + BS, soft, no tenderness to palpation Neuro:  CN 2-12 intact.  Strength 5/5 upper extremities.  Neg pronator drift.  Normal finger to nose.  Negative rhomberg.  Not dizzy upon standing up quickly.        Assessment & Plan:

## 2012-03-08 ENCOUNTER — Other Ambulatory Visit (HOSPITAL_COMMUNITY): Payer: Self-pay | Admitting: *Deleted

## 2012-03-11 ENCOUNTER — Encounter (HOSPITAL_COMMUNITY)
Admission: RE | Admit: 2012-03-11 | Discharge: 2012-03-11 | Disposition: A | Discharge status: Home / Self Care | Payer: Medicare Other | Source: Ambulatory Visit | Attending: Nephrology | Admitting: Nephrology

## 2012-03-11 DIAGNOSIS — N184 Chronic kidney disease, stage 4 (severe): Secondary | ICD-10-CM | POA: Insufficient documentation

## 2012-03-11 DIAGNOSIS — N039 Chronic nephritic syndrome with unspecified morphologic changes: Secondary | ICD-10-CM | POA: Insufficient documentation

## 2012-03-11 DIAGNOSIS — D631 Anemia in chronic kidney disease: Secondary | ICD-10-CM | POA: Insufficient documentation

## 2012-03-11 LAB — IRON AND TIBC
Iron: 112 ug/dL (ref 42–135)
UIBC: 103 ug/dL — ABNORMAL LOW (ref 125–400)

## 2012-03-11 LAB — RENAL FUNCTION PANEL
Albumin: 3.4 g/dL — ABNORMAL LOW (ref 3.5–5.2)
Calcium: 9.1 mg/dL (ref 8.4–10.5)
Creatinine, Ser: 3.9 mg/dL — ABNORMAL HIGH (ref 0.50–1.10)
GFR calc non Af Amer: 10 mL/min — ABNORMAL LOW (ref >90)
Sodium: 140 mEq/L (ref 135–145)

## 2012-03-11 LAB — BASIC METABOLIC PANEL: Sodium: 140 mmol/L (ref 137–147)

## 2012-03-11 MED ORDER — EPOETIN ALFA 20000 UNIT/ML IJ SOLN
INTRAMUSCULAR | Status: AC
  Administered 2012-03-11: 20000 [IU] via SUBCUTANEOUS
  Filled 2012-03-11: qty 1

## 2012-03-11 MED ORDER — EPOETIN ALFA 10000 UNIT/ML IJ SOLN: 20000.0000 [IU] | INTRAMUSCULAR | Status: DC

## 2012-03-12 ENCOUNTER — Encounter: Payer: Self-pay | Admitting: Family Medicine

## 2012-03-12 ENCOUNTER — Ambulatory Visit (INDEPENDENT_AMBULATORY_CARE_PROVIDER_SITE_OTHER): Payer: Medicare Other | Admitting: Family Medicine

## 2012-03-12 VITALS — BP 162/84 | HR 62 | Temp 98.2°F | Ht 59.0 in | Wt 137.4 lb

## 2012-03-12 DIAGNOSIS — I10 Essential (primary) hypertension: Secondary | ICD-10-CM

## 2012-03-12 DIAGNOSIS — D631 Anemia in chronic kidney disease: Secondary | ICD-10-CM

## 2012-03-12 DIAGNOSIS — H353290 Exudative age-related macular degeneration, unspecified eye, stage unspecified: Secondary | ICD-10-CM

## 2012-03-12 DIAGNOSIS — N184 Chronic kidney disease, stage 4 (severe): Secondary | ICD-10-CM

## 2012-03-12 DIAGNOSIS — G904 Autonomic dysreflexia: Secondary | ICD-10-CM

## 2012-03-12 DIAGNOSIS — N189 Chronic kidney disease, unspecified: Secondary | ICD-10-CM

## 2012-03-12 DIAGNOSIS — N039 Chronic nephritic syndrome with unspecified morphologic changes: Secondary | ICD-10-CM

## 2012-03-12 NOTE — Patient Instructions (Signed)
Continue your medicines the same. I'll send a letter to Dr Caryn Section  Please return to see Dr Sheffield Slider in 2 months.  Please do the stool test for blood and send them in. If there is blood I'll refer you to a gastroenterologist.

## 2012-03-12 NOTE — Assessment & Plan Note (Addendum)
Still having episodes Tremor may be worse due to decreased beta blocker

## 2012-03-12 NOTE — Assessment & Plan Note (Signed)
Still receiving shots, not driving due to poor vision

## 2012-03-12 NOTE — Assessment & Plan Note (Signed)
Treated yesterday with Aranesp

## 2012-03-12 NOTE — Assessment & Plan Note (Signed)
Still determined not to have dialysis

## 2012-03-12 NOTE — Progress Notes (Signed)
  Subjective:    Patient ID: Belinda [name], female    DOB: 23-May-1927, 78 y.o.   MRN: [id number]  HPIShe fainted at [facility], felt light-headed, sat down, then passed out briefly. Fecal incontinence of loose stool. Can't see Dr Ewing Schlein because [facility] doesn't take her insurance.   Got a shot in her left eye for macular degeneration last week from Dr Adelene Amas. Had worse vision due to the suction cup, but it's now improving.   Saw her cardiologist at who agreed with decreasing her beta blocker. Her shortness of breath is better.   Bruised her right wrist with minor trauma.  Hand tremors have been worse. Continues taking Buspar, but doesn't help as much.   Poor appetite, but no nausea.  Family stress of her son arguing with his daughter who lives with him. She has bipolar illness  She is scheduled to see Dr Caryn Section in May. She had labs and got an Aranesp shot yesterday because her hemoglobin had dropped.  Review of Systems     Objective:   Physical Exam  Constitutional: She is oriented to person, place, and time.  Cardiovascular: Normal rate and regular rhythm.   No murmur heard. Pulmonary/Chest: Effort normal and breath sounds normal.       Bulge around left low back surgical scar  Abdominal: Soft. She exhibits no distension. There is no tenderness.  Neurological: She is alert and oriented to person, place, and time.  Skin: No rash noted.       Ecchymosis right wrist  Psychiatric: She has a normal mood and affect.          Assessment & Plan:

## 2012-03-28 ENCOUNTER — Telehealth: Payer: Self-pay | Admitting: Family Medicine

## 2012-03-28 ENCOUNTER — Encounter: Payer: Self-pay | Admitting: Family Medicine

## 2012-03-28 MED ORDER — METOPROLOL SUCCINATE ER 25 MG PO TB24: 25.0000 mg | ORAL_TABLET | Freq: Every day | ORAL | Status: DC

## 2012-03-28 NOTE — Telephone Encounter (Signed)
Consult from Dr Caryn Section 03/26/12 placed in scan box He decreased Metoprolol to 25 mg daily after discussing with Dr Eldridge Dace

## 2012-04-05 ENCOUNTER — Other Ambulatory Visit (HOSPITAL_COMMUNITY): Payer: Self-pay | Admitting: *Deleted

## 2012-04-08 ENCOUNTER — Inpatient Hospital Stay (HOSPITAL_COMMUNITY): Admission: RE | Admit: 2012-04-08 | Payer: Medicare Other | Source: Ambulatory Visit

## 2012-04-08 ENCOUNTER — Encounter (HOSPITAL_COMMUNITY): Payer: Medicare Other

## 2012-04-15 ENCOUNTER — Encounter (HOSPITAL_COMMUNITY)
Admission: RE | Admit: 2012-04-15 | Discharge: 2012-04-15 | Disposition: A | Discharge status: Home / Self Care | Payer: Medicare Other | Source: Ambulatory Visit | Attending: Nephrology | Admitting: Nephrology

## 2012-04-15 DIAGNOSIS — N184 Chronic kidney disease, stage 4 (severe): Secondary | ICD-10-CM | POA: Insufficient documentation

## 2012-04-15 DIAGNOSIS — N039 Chronic nephritic syndrome with unspecified morphologic changes: Secondary | ICD-10-CM | POA: Insufficient documentation

## 2012-04-15 DIAGNOSIS — D631 Anemia in chronic kidney disease: Secondary | ICD-10-CM | POA: Insufficient documentation

## 2012-04-15 LAB — IRON AND TIBC
Iron: 117 ug/dL (ref 42–135)
Saturation Ratios: 45 % (ref 20–55)
TIBC: 262 ug/dL (ref 250–470)

## 2012-04-15 MED ORDER — EPOETIN ALFA 10000 UNIT/ML IJ SOLN: 40000.0000 [IU] | INTRAMUSCULAR | Status: DC

## 2012-04-22 LAB — POCT HEMOGLOBIN-HEMACUE: Hemoglobin: 11.1 g/dL — ABNORMAL LOW (ref 12.0–15.0)

## 2012-04-23 ENCOUNTER — Encounter: Payer: Self-pay | Admitting: Family Medicine

## 2012-04-23 ENCOUNTER — Ambulatory Visit (INDEPENDENT_AMBULATORY_CARE_PROVIDER_SITE_OTHER): Payer: Medicare Other | Admitting: Family Medicine

## 2012-04-23 VITALS — BP 153/82 | HR 116 | Ht 59.0 in | Wt 135.0 lb

## 2012-04-23 DIAGNOSIS — I10 Essential (primary) hypertension: Secondary | ICD-10-CM

## 2012-04-23 DIAGNOSIS — E1165 Type 2 diabetes mellitus with hyperglycemia: Secondary | ICD-10-CM

## 2012-04-23 DIAGNOSIS — E118 Type 2 diabetes mellitus with unspecified complications: Secondary | ICD-10-CM

## 2012-04-23 DIAGNOSIS — H9190 Unspecified hearing loss, unspecified ear: Secondary | ICD-10-CM

## 2012-04-23 LAB — POCT GLYCOSYLATED HEMOGLOBIN (HGB A1C): Hemoglobin A1C: 6.2

## 2012-04-23 NOTE — Assessment & Plan Note (Signed)
Well controlled off medications.

## 2012-04-23 NOTE — Patient Instructions (Signed)
Continue your medicines the same. Good luck with your heart monitor results  I'll send a letter to Dr Lovey Newcomer  Please return to see Dr Sheffield Slider in 3 months.

## 2012-04-23 NOTE — Progress Notes (Signed)
  Subjective:    Patient ID: Belinda [name], female    DOB: [birth year], 78 y.o.   MRN: [id number]  HPIShe wore an event monitor for a month and had no spells by Sat. Yesterday while shopping she fainted and had a diarrheal accident at a store. Her cardiologist has had her decrease her Metoprolol to 1/4 tablet.   Her shaking is better on decreasing her Buspar to 7.5 mg once daily  She saw her renal Dr Caryn Section the end of April. She complained to him of bilateral decreased hearing. He recommended Dr Lovey Newcomer. The wait to see him is 3 months, so she will see Dr Donell Beers.   Macular degeneration - Dr Gwendalyn Ege put a shot in her left eye recently. Said he can't give anymore. She isn't driving.   Hypertension - was higher after her last Aranesp shot. Most recent one was held due to her Hemoglobin above 11. blood pressures running 117-205/59-83 per the monitor memory. Taking all of her medications.     Review of Systems  see HPI for ROS     Objective:   Physical Exam  Constitutional: She is oriented to person, place, and time.  HENT:  Right Ear: External ear normal.  Left Ear: External ear normal.  Cardiovascular: Normal rate and regular rhythm.   No murmur heard. Pulmonary/Chest: Effort normal and breath sounds normal.       Bulge around left low back surgical scar  Abdominal: Soft. She exhibits no distension. There is no tenderness.  Musculoskeletal: She exhibits edema.       1+  Neurological: She is alert and oriented to person, place, and time.  Skin: No rash noted.       Ecchymoses forearms  Psychiatric: She has a normal mood and affect.          Assessment & Plan:

## 2012-04-23 NOTE — Assessment & Plan Note (Signed)
Bilateral with no abnormalities on exam. She will see Dr Donell Beers

## 2012-04-23 NOTE — Assessment & Plan Note (Signed)
Elevated, but not volume overloaded. Will defer medicine adjustments to her cardiologist.

## 2012-04-30 ENCOUNTER — Encounter (HOSPITAL_COMMUNITY): Payer: Medicare Other

## 2012-05-06 ENCOUNTER — Telehealth: Payer: Self-pay | Admitting: *Deleted

## 2012-05-06 NOTE — Telephone Encounter (Signed)
Patient calling because she is feeling "weak" and has had some diarrhea.  Does not have any energy and has hx of fainting.  Thirty day holter monitor results were normal.  Patient has not taken BP meds x 2 days due to BP was "low."  BP today at 4:15pm--124/62 & CBG--159.  Patient declined visit to urgent care or ED to be evaluated.  Patient asking if she can stop her BP meds.  Informed that she needs office visit to discuss with Dr. Sheffield Slider.  Appt scheduled for 05/07/12 @ 2:15pm with Dr. Sheffield Slider.  Gaylene Brooks, RN

## 2012-05-07 ENCOUNTER — Ambulatory Visit (INDEPENDENT_AMBULATORY_CARE_PROVIDER_SITE_OTHER): Payer: Medicare Other | Admitting: Family Medicine

## 2012-05-07 ENCOUNTER — Encounter: Payer: Self-pay | Admitting: Family Medicine

## 2012-05-07 VITALS — BP 192/76 | HR 79 | Ht 59.0 in | Wt 140.9 lb

## 2012-05-07 DIAGNOSIS — E1165 Type 2 diabetes mellitus with hyperglycemia: Secondary | ICD-10-CM

## 2012-05-07 DIAGNOSIS — E1169 Type 2 diabetes mellitus with other specified complication: Secondary | ICD-10-CM

## 2012-05-07 DIAGNOSIS — N189 Chronic kidney disease, unspecified: Secondary | ICD-10-CM

## 2012-05-07 DIAGNOSIS — N039 Chronic nephritic syndrome with unspecified morphologic changes: Secondary | ICD-10-CM

## 2012-05-07 DIAGNOSIS — K639 Disease of intestine, unspecified: Secondary | ICD-10-CM

## 2012-05-07 DIAGNOSIS — N184 Chronic kidney disease, stage 4 (severe): Secondary | ICD-10-CM

## 2012-05-07 DIAGNOSIS — I10 Essential (primary) hypertension: Secondary | ICD-10-CM

## 2012-05-07 DIAGNOSIS — E118 Type 2 diabetes mellitus with unspecified complications: Secondary | ICD-10-CM

## 2012-05-07 LAB — CBC
HCT: 28.8 % — ABNORMAL LOW (ref 36.0–46.0)
Hemoglobin: 9.4 g/dL — ABNORMAL LOW (ref 12.0–15.0)
RBC: 3.01 MIL/uL — ABNORMAL LOW (ref 3.87–5.11)
WBC: 10.8 10*3/uL — ABNORMAL HIGH (ref 4.0–10.5)

## 2012-05-07 NOTE — Progress Notes (Signed)
  Subjective:    Patient ID: Belinda [name], female    DOB: Mar 22, 1927, 78 y.o.   MRN: [id number]  HPI Light-headed episode while standing shopping. She had been having diarrhea before.  Friend helped her to the car. BP at home was 164/56 HR in the 60's. The event monitor was interpreted by the cardiologist as no arrhythmia as a cause of these episodes She missed her Aranesp injection because of this episode.  cbg 159 in the AM and similar yesterday  Not taking Nifediac because even 1/2 tab makes her feel lightheaded.   She will see Dr Caryn Section later this month.  Review of Systems     Objective:   Physical Exam  Constitutional: She is oriented to person, place, and time. She appears well-nourished.  Cardiovascular: Normal rate and regular rhythm.   No murmur heard. Pulmonary/Chest: Effort normal and breath sounds normal.  Abdominal: She exhibits no distension. There is no tenderness.       Bulge left CVA area surrounding nephrectomy scar  Musculoskeletal: She exhibits no edema.  Neurological: She is alert and oriented to person, place, and time.  Skin: Skin is warm and dry.  Psychiatric:       Her usual dysphoric presentation that quickly improves          Assessment & Plan:

## 2012-05-07 NOTE — Patient Instructions (Signed)
Try taking the Niifedipine 1/4 tablet daily  I will send a letter to Dr Caryn Section with your lab results.   I'm glad you have gained some weight without edema.

## 2012-05-08 LAB — BASIC METABOLIC PANEL
BUN: 69 mg/dL — ABNORMAL HIGH (ref 6–23)
Chloride: 110 mEq/L (ref 96–112)
Glucose, Bld: 131 mg/dL — ABNORMAL HIGH (ref 70–99)
Potassium: 4.5 mEq/L (ref 3.5–5.3)
Sodium: 141 mEq/L (ref 135–145)

## 2012-05-08 NOTE — Assessment & Plan Note (Signed)
Now diet controlled. 

## 2012-05-08 NOTE — Assessment & Plan Note (Signed)
Not well controlled off of the Nifedipine. She will try taking 1/4 tablet. Depending on lab results, will consider other options.

## 2012-05-08 NOTE — Assessment & Plan Note (Signed)
I believe this is the cause of her near syncopal episodes which are always associated with diarrhea.

## 2012-05-09 ENCOUNTER — Encounter (HOSPITAL_COMMUNITY)
Admission: RE | Admit: 2012-05-09 | Discharge: 2012-05-09 | Disposition: A | Discharge status: Home / Self Care | Payer: Medicare Other | Source: Ambulatory Visit | Attending: Nephrology | Admitting: Nephrology

## 2012-05-09 DIAGNOSIS — N184 Chronic kidney disease, stage 4 (severe): Secondary | ICD-10-CM | POA: Insufficient documentation

## 2012-05-09 DIAGNOSIS — D631 Anemia in chronic kidney disease: Secondary | ICD-10-CM | POA: Insufficient documentation

## 2012-05-09 LAB — IRON AND TIBC
Iron: 90 ug/dL (ref 42–135)
UIBC: 120 ug/dL — ABNORMAL LOW (ref 125–400)

## 2012-05-09 LAB — POCT HEMOGLOBIN-HEMACUE: Hemoglobin: 9.9 g/dL — ABNORMAL LOW (ref 12.0–15.0)

## 2012-05-09 MED ORDER — EPOETIN ALFA 10000 UNIT/ML IJ SOLN
40000.0000 [IU] | INTRAMUSCULAR | Status: DC
  Administered 2012-05-09: 40000 [IU] via SUBCUTANEOUS
  Filled 2012-05-09: qty 1

## 2012-05-16 ENCOUNTER — Ambulatory Visit (INDEPENDENT_AMBULATORY_CARE_PROVIDER_SITE_OTHER): Payer: Medicare Other | Admitting: Family Medicine

## 2012-05-16 ENCOUNTER — Other Ambulatory Visit: Payer: Self-pay

## 2012-05-16 ENCOUNTER — Ambulatory Visit (HOSPITAL_COMMUNITY)
Admission: RE | Admit: 2012-05-16 | Discharge: 2012-05-16 | Disposition: A | Discharge status: Home / Self Care | Payer: Medicare Other | Source: Ambulatory Visit | Attending: Family Medicine | Admitting: Family Medicine

## 2012-05-16 ENCOUNTER — Encounter: Payer: Self-pay | Admitting: Family Medicine

## 2012-05-16 ENCOUNTER — Telehealth: Payer: Self-pay | Admitting: Family Medicine

## 2012-05-16 VITALS — BP 160/66 | HR 80 | Temp 98.1°F | Wt 138.3 lb

## 2012-05-16 DIAGNOSIS — I4510 Unspecified right bundle-branch block: Secondary | ICD-10-CM | POA: Insufficient documentation

## 2012-05-16 DIAGNOSIS — R002 Palpitations: Secondary | ICD-10-CM

## 2012-05-16 LAB — CBC
Hemoglobin: 10.4 g/dL — ABNORMAL LOW (ref 12.0–15.0)
MCH: 31.7 pg (ref 26.0–34.0)
MCHC: 32.5 g/dL (ref 30.0–36.0)
Platelets: 225 10*3/uL (ref 150–400)
RBC: 3.28 MIL/uL — ABNORMAL LOW (ref 3.87–5.11)

## 2012-05-16 LAB — BASIC METABOLIC PANEL
Calcium: 9.4 mg/dL (ref 8.4–10.5)
Glucose, Bld: 162 mg/dL — ABNORMAL HIGH (ref 70–99)
Potassium: 4.4 mEq/L (ref 3.5–5.3)
Sodium: 140 mEq/L (ref 135–145)

## 2012-05-16 MED ORDER — METOPROLOL SUCCINATE ER 50 MG PO TB24: 50.0000 mg | ORAL_TABLET | Freq: Every day | ORAL | Status: DC

## 2012-05-16 NOTE — Assessment & Plan Note (Signed)
A: new onset palpitations. Patient in sinus rhythm during EKG and physical exam but paroxysmal A fib vs. PVCs are possible. Hemodynamically stable with elevated BP. P: -increase BB to 50 mg daily.  -f/u scheduled cardiology per AVS -check Hgb, electrolytes and TSH as abnormalities can induce arhythmia.  -reviewed red flags to prompt patient to seek immediate medical attention.

## 2012-05-16 NOTE — Telephone Encounter (Signed)
Advised patient that Dr. Sheffield Slider will be back mid next week and I will send message to him about Plavix , and Ecotrin and dental procedure and HGB.  Patient agreeable to this plan.

## 2012-05-16 NOTE — Patient Instructions (Addendum)
Belinda [name],   Thank you for coming in to see me today. For your palpitations: -it is not another heart attack. If you develop chest pain like your previous MI please call 911.  -increased toprol to 50 mg daily, I have sent the new dose to your pharmacy. -bloodwork: TSH, CBC, BMET -F/u appt with Dr. Jim Like June 24th at 1:45 PM  I asked about the dentist, you should get a call from his nurse, but his scheduler read his reply to me which was that he wants you to stay on the aspirin. If the dentist want you to stop plavix you can do so 5 days before the extraction.   Please f/u with Dr. Sheffield Slider in 2 weeks.   Dr. Armen Pickup

## 2012-05-16 NOTE — Telephone Encounter (Signed)
States she can come to office  now.  States she does feel hear racing now and left arm is hurting.  Consulted with Dr. Earnest Bailey and patient will come to office now.

## 2012-05-16 NOTE — Telephone Encounter (Signed)
Pt is going to have teeth pulled and needs to talk to nurse about this.  It's about her being taken off some of her meds

## 2012-05-16 NOTE — Telephone Encounter (Addendum)
Patient  calls with several problems.    She has two front teeth that need to be pulled because they broke off. Needs a partial. Wants to make appointment with dentist but knows she will need to be off Plavix and aspirin for a while.     She had labs last week at nephrologist office and hgb  Was down from 11.1 to 9.9.  State she got  a shot last week and  goes back in 3 week.    Yesterday had episode of left arm hurting from shoulder down , she got weak and felt like she would pass out.  She felt her heart racing and thumping in her ears .That occurred off and on all afternoon.  Today not having that sensation    Also concerned about Plavix , She has been on generic Plavix for about 6 months and thinks she should be back on brand name.  Will send these  Questions to Dr. Sheffield Slider about her meds and stopping plavix for dental work.     Patient advised she needs to come here today  for  appointment  She will check with her son and call back.

## 2012-05-16 NOTE — Progress Notes (Signed)
Subjective:     Patient ID: Belinda [name], female   DOB: 12/27/1926, 78 y.o.   MRN: [id number]  HPI 78 yo F patient with a past medical history significant for CAD and syncope presents for a same day visit with a complaint of new onset palpations.   Cardiologist: Dr. Eldridge Dace [facility]).   1. Palpitations: x 2 days. Intermittent. Associated with presyncope. Patient denies chest pain, shortness of breath and LE edema. Given her history of syncope in the past she had a work up by her cardiologist  that included an ECHO and Holter monitor. The results of the Holter monitor were normal per patient report. The ECHO (01/13/11) revealed EF 60-65%, Borderline LA enlargement, AV sclerosis and Trace AV regurgitation.  Of note, patients Toprol XL  has recently (April 2013) been decreased from 100 mg to 25 mg daily.   Reviewed social history: she denies tobacco, ETOH and illicit drug use.   Review of Systems As per HPI    Objective:   Physical Exam BP 160/66  Pulse 80  Temp 98.1 F (36.7 C)  Wt 138 lb 4.8 oz (62.732 kg) General appearance: alert, cooperative and no distress Neck: no carotid bruit Lungs: clear to auscultation bilaterally Heart: normal apical impulse, regular rate and rhythm, S1, S2 normal and systolic murmur: early systolic 3/6, decrescendo radiates to carotids Extremities: extremities normal, atraumatic, no cyanosis or edema Pulses: 2+ and symmetric  EKG: normal EKG, normal sinus rhythm, RBBB, good R wave progress, No new ST changes.  Assessment and Plan:   See problem list

## 2012-05-17 LAB — TSH: TSH: 3.404 u[IU]/mL (ref 0.350–4.500)

## 2012-05-30 ENCOUNTER — Telehealth: Payer: Self-pay | Admitting: Family Medicine

## 2012-05-30 NOTE — Telephone Encounter (Signed)
Called patient to let her know that TSH and electrolytes were normal.

## 2012-06-03 ENCOUNTER — Other Ambulatory Visit: Payer: Self-pay | Admitting: Family Medicine

## 2012-06-04 ENCOUNTER — Encounter: Payer: Self-pay | Admitting: Family Medicine

## 2012-06-04 ENCOUNTER — Ambulatory Visit (INDEPENDENT_AMBULATORY_CARE_PROVIDER_SITE_OTHER): Payer: Medicare Other | Admitting: Family Medicine

## 2012-06-04 VITALS — BP 158/68 | HR 70 | Ht 59.0 in | Wt 142.0 lb

## 2012-06-04 DIAGNOSIS — K469 Unspecified abdominal hernia without obstruction or gangrene: Secondary | ICD-10-CM

## 2012-06-04 DIAGNOSIS — J45909 Unspecified asthma, uncomplicated: Secondary | ICD-10-CM

## 2012-06-04 DIAGNOSIS — R002 Palpitations: Secondary | ICD-10-CM

## 2012-06-04 DIAGNOSIS — I10 Essential (primary) hypertension: Secondary | ICD-10-CM

## 2012-06-04 DIAGNOSIS — F329 Major depressive disorder, single episode, unspecified: Secondary | ICD-10-CM

## 2012-06-04 DIAGNOSIS — N184 Chronic kidney disease, stage 4 (severe): Secondary | ICD-10-CM

## 2012-06-04 MED ORDER — MIRTAZAPINE 15 MG PO TABS: 15.0000 mg | ORAL_TABLET | Freq: Every day | ORAL | Status: DC

## 2012-06-04 MED ORDER — MUPIROCIN 2 % EX OINT: TOPICAL_OINTMENT | Freq: Two times a day (BID) | CUTANEOUS | Status: DC

## 2012-06-04 NOTE — Assessment & Plan Note (Signed)
Elevated systolic, but she says that her nephrologist was happy with it this AM

## 2012-06-04 NOTE — Patient Instructions (Addendum)
Try the Cetirizine to see if it helps your post nasal drip and hoarseness.  I increased the Mirtazepine to 15 mg daily, to help your sleep  Good luck with your dental surgery.   Please return to see Dr Sheffield Slider in 1 month

## 2012-06-04 NOTE — Progress Notes (Signed)
  Subjective:    Patient ID: Belinda [name], female    DOB: 09-Oct-1927, 78 y.o.   MRN: [id number]  HPI Hypertension - she says Dr. Caryn Section is happy with her blood pressure and wants to see her again in 3 months. He did not change her medications  Palpitation - new cardiologist Dr. Eldridge Dace changed Toprol to just taking when necessary for  palpitations.  Insomnia - the one half of 15 mg mirtazapine tablet is not making her sleepy. Her appetite remains poor  Dental caries - her dentist will pull left upper 2 broken teeth in one week. She is to stop Plavix 5 days before that but continue aspirin.   Gastrointestinal - no recent episodes of near syncope with diarrhea     Review of Systems     Objective:   Physical Exam  Constitutional: She appears well-developed and well-nourished.  HENT:       Voice mildly hoarse  2 left upper molars are broken off without signs of infection. No local adenopathy.  Cardiovascular: Normal rate and regular rhythm.   Murmur heard.      G2/6 LUSB murmur  Pulmonary/Chest: Effort normal and breath sounds normal. She has no wheezes. She has no rales.  Abdominal: Soft. She exhibits no distension.       Large left flank and mid back hernia around surgical scar.  Neurological: She is alert.  Skin:       No infected appearing lesions were seen on her legs  Psychiatric: She has a normal mood and affect. Her behavior is normal.          Assessment & Plan:

## 2012-06-04 NOTE — Assessment & Plan Note (Signed)
Slowly enlarging, but no signs of incarceration

## 2012-06-04 NOTE — Assessment & Plan Note (Signed)
Regular rhythm today, without symptoms of syncope

## 2012-06-04 NOTE — Assessment & Plan Note (Signed)
Mild  Intermittent symptoms, no signs of volume overload

## 2012-06-04 NOTE — Assessment & Plan Note (Signed)
Creatinine continues to increase

## 2012-06-07 ENCOUNTER — Other Ambulatory Visit (HOSPITAL_COMMUNITY): Payer: Self-pay | Admitting: *Deleted

## 2012-06-10 ENCOUNTER — Encounter (HOSPITAL_COMMUNITY)
Admission: RE | Admit: 2012-06-10 | Discharge: 2012-06-10 | Disposition: A | Discharge status: Home / Self Care | Payer: Medicare Other | Source: Ambulatory Visit | Attending: Nephrology | Admitting: Nephrology

## 2012-06-10 ENCOUNTER — Encounter: Payer: Self-pay | Admitting: *Deleted

## 2012-06-10 DIAGNOSIS — N039 Chronic nephritic syndrome with unspecified morphologic changes: Secondary | ICD-10-CM | POA: Insufficient documentation

## 2012-06-10 DIAGNOSIS — N184 Chronic kidney disease, stage 4 (severe): Secondary | ICD-10-CM | POA: Insufficient documentation

## 2012-06-10 DIAGNOSIS — D631 Anemia in chronic kidney disease: Secondary | ICD-10-CM | POA: Insufficient documentation

## 2012-06-10 LAB — RENAL FUNCTION PANEL
CO2: 19 mEq/L (ref 19–32)
GFR calc Af Amer: 13 mL/min — ABNORMAL LOW (ref >90)
GFR calc non Af Amer: 11 mL/min — ABNORMAL LOW (ref >90)
Glucose, Bld: 114 mg/dL — ABNORMAL HIGH (ref 70–99)
Phosphorus: 3.9 mg/dL (ref 2.3–4.6)
Potassium: 5.8 mEq/L — ABNORMAL HIGH (ref 3.5–5.1)
Sodium: 139 mEq/L (ref 135–145)

## 2012-06-10 LAB — POCT HEMOGLOBIN-HEMACUE: Hemoglobin: 9.6 g/dL — ABNORMAL LOW (ref 12.0–15.0)

## 2012-06-10 LAB — IRON AND TIBC: UIBC: 108 ug/dL — ABNORMAL LOW (ref 125–400)

## 2012-06-10 MED ORDER — EPOETIN ALFA 10000 UNIT/ML IJ SOLN: 40000.0000 [IU] | INTRAMUSCULAR | Status: DC

## 2012-06-10 MED ORDER — EPOETIN ALFA 40000 UNIT/ML IJ SOLN
INTRAMUSCULAR | Status: AC
  Administered 2012-06-10: 40000 [IU] via SUBCUTANEOUS
  Filled 2012-06-10: qty 1

## 2012-06-24 ENCOUNTER — Other Ambulatory Visit: Payer: Self-pay | Admitting: Nephrology

## 2012-07-05 ENCOUNTER — Other Ambulatory Visit (HOSPITAL_COMMUNITY): Payer: Self-pay | Admitting: *Deleted

## 2012-07-08 ENCOUNTER — Inpatient Hospital Stay (HOSPITAL_COMMUNITY): Admission: RE | Admit: 2012-07-08 | Payer: Medicare Other | Source: Ambulatory Visit

## 2012-07-16 ENCOUNTER — Ambulatory Visit (INDEPENDENT_AMBULATORY_CARE_PROVIDER_SITE_OTHER): Payer: Medicare Other | Admitting: Family Medicine

## 2012-07-16 ENCOUNTER — Encounter: Payer: Self-pay | Admitting: Family Medicine

## 2012-07-16 VITALS — BP 129/63 | HR 67 | Ht 59.0 in | Wt 138.4 lb

## 2012-07-16 DIAGNOSIS — M25519 Pain in unspecified shoulder: Secondary | ICD-10-CM

## 2012-07-16 DIAGNOSIS — E118 Type 2 diabetes mellitus with unspecified complications: Secondary | ICD-10-CM

## 2012-07-16 DIAGNOSIS — M25512 Pain in left shoulder: Secondary | ICD-10-CM

## 2012-07-16 DIAGNOSIS — I10 Essential (primary) hypertension: Secondary | ICD-10-CM

## 2012-07-16 DIAGNOSIS — N189 Chronic kidney disease, unspecified: Secondary | ICD-10-CM

## 2012-07-16 DIAGNOSIS — N039 Chronic nephritic syndrome with unspecified morphologic changes: Secondary | ICD-10-CM

## 2012-07-16 DIAGNOSIS — E1165 Type 2 diabetes mellitus with hyperglycemia: Secondary | ICD-10-CM

## 2012-07-16 DIAGNOSIS — F411 Generalized anxiety disorder: Secondary | ICD-10-CM

## 2012-07-16 MED ORDER — TRAZODONE HCL 50 MG PO TABS: 25.0000 mg | ORAL_TABLET | Freq: Every evening | ORAL | Status: DC | PRN

## 2012-07-16 MED ORDER — METOPROLOL SUCCINATE ER 50 MG PO TB24: 50.0000 mg | ORAL_TABLET | Freq: Every day | ORAL | Status: DC

## 2012-07-16 NOTE — Patient Instructions (Signed)
Take acetaminophen if your shoulder hurts tonight  Try Trazodone 25 - 50 mg at bedtime to help to go to sleep.   Please return to see Dr Sheffield Slider in 2 weeks.

## 2012-07-17 DIAGNOSIS — M25512 Pain in left shoulder: Secondary | ICD-10-CM | POA: Insufficient documentation

## 2012-07-17 NOTE — Assessment & Plan Note (Signed)
well controlled  

## 2012-07-17 NOTE — Assessment & Plan Note (Signed)
After explaining the procedure, obtaining consent, and sterile preparation the left AC joint and left shoulder were injected with 3 cc Triamcinolone 10 mg and 6 cc of Lidocaine. Thepatient tolerated the procedure well.

## 2012-07-17 NOTE — Progress Notes (Signed)
  Subjective:    Patient ID: JOHNEISHA BROADEN, female    DOB: [birth year], 78 y.o.   MRN: [id number]  HPI Energy level is low. 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 Doesn't help to wear the binder  Arthritis in both knees hurt. Would like a shot.  Shoulder pain on left is even worse. Hurts to put her hand behind her head or to push a buggy. Chiropractic adjustments help her neck and shoulder pain, but it doesn't last.   Initial insomnia hasn't responded to Mirtazepine, so she's not taking it.   Diarrhea - not recently Review of Systems     Objective:   Physical Exam  Constitutional: She is oriented to person, place, and time.  Cardiovascular: Normal rate and regular rhythm.   Pulmonary/Chest: Effort normal and breath sounds normal.  Abdominal: She exhibits no distension. There is tenderness. There is no rebound and no guarding.       The large left flank surgical hernia is unchanged and only mildly tender to palpation  Musculoskeletal: She exhibits no edema.       Very tender over left AC joint and hurts to reach across chest.   Pain with left shoulder ext rotation, but negative impingement and drop tests. Tender in subacromial shoulder.   Both knees tender at the joint lines without warmth or effusion  Neurological: She is alert and oriented to person, place, and time.  Psychiatric: She has a normal mood and affect. Her behavior is normal. Judgment and thought content normal.          Assessment & Plan:

## 2012-07-17 NOTE — Assessment & Plan Note (Signed)
Last erythropoeiten analog injection was in July. Says she's going to stop them because it isn't helping.

## 2012-07-17 NOTE — Assessment & Plan Note (Signed)
well controlled off medications

## 2012-07-17 NOTE — Assessment & Plan Note (Signed)
Causing insomnia. Comorbidities contraindicate sedative hypnotics.  Will replace Mirtazepine with Trazodone

## 2012-07-29 ENCOUNTER — Encounter: Payer: Self-pay | Admitting: Home Health Services

## 2012-07-29 ENCOUNTER — Telehealth: Payer: Self-pay | Admitting: Family Medicine

## 2012-07-29 NOTE — Telephone Encounter (Signed)
Fwd. To Dr.Hale .Aamir Mclinden  

## 2012-07-29 NOTE — Telephone Encounter (Signed)
Patient had another fall over the weekend and would like to speak to Dr. Sheffield Slider about it.
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
## 2012-08-01 ENCOUNTER — Encounter: Payer: Self-pay | Admitting: Family Medicine

## 2012-08-01 ENCOUNTER — Ambulatory Visit (INDEPENDENT_AMBULATORY_CARE_PROVIDER_SITE_OTHER): Payer: Medicare Other | Admitting: Family Medicine

## 2012-08-01 ENCOUNTER — Ambulatory Visit (HOSPITAL_COMMUNITY)
Admission: RE | Admit: 2012-08-01 | Discharge: 2012-08-01 | Disposition: A | Discharge status: Home / Self Care | Payer: Medicare Other | Source: Ambulatory Visit | Attending: Family Medicine | Admitting: Family Medicine

## 2012-08-01 VITALS — BP 150/72 | HR 81 | Temp 98.2°F | Ht 59.0 in | Wt 139.0 lb

## 2012-08-01 DIAGNOSIS — D631 Anemia in chronic kidney disease: Secondary | ICD-10-CM

## 2012-08-01 DIAGNOSIS — K639 Disease of intestine, unspecified: Secondary | ICD-10-CM

## 2012-08-01 DIAGNOSIS — E1169 Type 2 diabetes mellitus with other specified complication: Secondary | ICD-10-CM

## 2012-08-01 DIAGNOSIS — E1165 Type 2 diabetes mellitus with hyperglycemia: Secondary | ICD-10-CM

## 2012-08-01 DIAGNOSIS — I4510 Unspecified right bundle-branch block: Secondary | ICD-10-CM | POA: Insufficient documentation

## 2012-08-01 DIAGNOSIS — R197 Diarrhea, unspecified: Secondary | ICD-10-CM

## 2012-08-01 DIAGNOSIS — N189 Chronic kidney disease, unspecified: Secondary | ICD-10-CM

## 2012-08-01 DIAGNOSIS — IMO0002 Reserved for concepts with insufficient information to code with codable children: Secondary | ICD-10-CM

## 2012-08-01 DIAGNOSIS — R55 Syncope and collapse: Secondary | ICD-10-CM

## 2012-08-01 LAB — CBC
HCT: 30 % — ABNORMAL LOW (ref 36.0–46.0)
MCH: 30.7 pg (ref 26.0–34.0)
MCV: 93.2 fL (ref 78.0–100.0)
Platelets: 197 10*3/uL (ref 150–400)
RBC: 3.22 MIL/uL — ABNORMAL LOW (ref 3.87–5.11)
WBC: 11.1 10*3/uL — ABNORMAL HIGH (ref 4.0–10.5)

## 2012-08-01 LAB — POCT GLYCOSYLATED HEMOGLOBIN (HGB A1C): Hemoglobin A1C: 6.5

## 2012-08-01 LAB — COMPREHENSIVE METABOLIC PANEL
Alkaline Phosphatase: 73 U/L (ref 39–117)
CO2: 20 mEq/L (ref 19–32)
Creat: 3.65 mg/dL — ABNORMAL HIGH (ref 0.50–1.10)
Glucose, Bld: 153 mg/dL — ABNORMAL HIGH (ref 70–99)
Sodium: 139 mEq/L (ref 135–145)
Total Bilirubin: 0.4 mg/dL (ref 0.3–1.2)
Total Protein: 6.1 g/dL (ref 6.0–8.3)

## 2012-08-01 NOTE — Patient Instructions (Addendum)
Wear support stockings whenever you will be up on your feet for significant periods of time.  Consider getting a walker with a seat that you can sit on when you feel light-headed  I will call if any labs are abnormal  Please return to see Dr Sheffield Slider in 2 weeks.
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
## 2012-08-07 ENCOUNTER — Telehealth: Payer: Self-pay | Admitting: Family Medicine

## 2012-08-07 NOTE — Telephone Encounter (Signed)
Patient notified

## 2012-08-07 NOTE — Telephone Encounter (Signed)
I reviewed her lab work of 08/01/12.  All numbers are the same or improved from previous studies.  Specifically, her kidney function is stable since June.  No changes or worries based on her current lab work. She should plan to see Dr. Sheffield Slider next week if her diarrhea and weakness continue.

## 2012-08-07 NOTE — Addendum Note (Signed)
Addended by: Herminio Heads on: 08/07/2012 11:52 AM   Modules accepted: Orders

## 2012-08-07 NOTE — Telephone Encounter (Signed)
Pt is still not feeling good and has started with diarrhea again - she wants to speak with nurse about her labs from last week.  She hasn't heard anything and doesn't want to see anyone except Dr Sheffield Slider  Is bringing a urine sample this morning but not sure what time

## 2012-08-07 NOTE — Telephone Encounter (Signed)
Spoke with patient . Advised will check with MD about her labs . She will bring 24 hour urine in today and will tell her then.     Also she reports diarrhea once yesterday and 4 times today.  Generally takes Citrucel but has not taken that in 3-4 days. She did take two OTC anti diarrhea pills this AM. States she continues to feel weak but really doesn't think she is any worse that last week at office visit on 08/29.  Will send message to Dr. Leveda Anna.

## 2012-08-13 ENCOUNTER — Ambulatory Visit (INDEPENDENT_AMBULATORY_CARE_PROVIDER_SITE_OTHER): Payer: Medicare Other | Admitting: Family Medicine

## 2012-08-13 ENCOUNTER — Encounter: Payer: Self-pay | Admitting: Family Medicine

## 2012-08-13 VITALS — BP 136/74 | HR 93 | Temp 98.4°F | Ht 59.0 in | Wt 135.0 lb

## 2012-08-13 DIAGNOSIS — I10 Essential (primary) hypertension: Secondary | ICD-10-CM

## 2012-08-13 DIAGNOSIS — N184 Chronic kidney disease, stage 4 (severe): Secondary | ICD-10-CM

## 2012-08-13 DIAGNOSIS — F411 Generalized anxiety disorder: Secondary | ICD-10-CM

## 2012-08-13 DIAGNOSIS — K639 Disease of intestine, unspecified: Secondary | ICD-10-CM

## 2012-08-13 DIAGNOSIS — F419 Anxiety disorder, unspecified: Secondary | ICD-10-CM

## 2012-08-13 DIAGNOSIS — E1169 Type 2 diabetes mellitus with other specified complication: Secondary | ICD-10-CM

## 2012-08-13 MED ORDER — ACETAMINOPHEN ER 650 MG PO TBCR: 650.0000 mg | EXTENDED_RELEASE_TABLET | Freq: Three times a day (TID) | ORAL | Status: DC | PRN

## 2012-08-13 MED ORDER — BUSPIRONE HCL 15 MG PO TABS: 15.0000 mg | ORAL_TABLET | Freq: Every day | ORAL | Status: DC

## 2012-08-13 NOTE — Patient Instructions (Signed)
Take the Acetaminophen 650 mg one every 8 hours as needed for pain  Go ahead and make an appointment with Dr Farris Has for your knees  Please return to see Dr Sheffield Slider in 2 months.

## 2012-08-13 NOTE — Progress Notes (Signed)
  Subjective:    Patient ID: Belinda [name], female    DOB: Jan 15, 1927, 78 y.o.   MRN: [id number]  HPI Insomnia - she had to stop the Trazodone due to lightheadedness. Only taking one Buspar daily due to renal failure  Diarrhea - has been better the past 2 days, but 6x on the day before that. Worries about having cancer of the stomach.  left flank wound hernia - hurts at times, relieved by Tramadol  DM - fasting cbg's 124, 157  Knee pain - left > right. Plans to visit Dr Farris Has at the ortho office for this. 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 She has an appt with Dr Caryn Section in October.    Review of Systems     Objective:   Physical Exam  Cardiovascular: Normal rate and regular rhythm.   Pulmonary/Chest: Effort normal and breath sounds normal.  Abdominal:       Herniation of internal organs into large left CVA area wound hernia is easily reducible, but uncomfortable to touch.   Skin: Skin is warm and dry.  Psychiatric: Judgment and thought content normal.       Her usual dysphoric affect.   No active aerophagia.           Assessment & Plan:

## 2012-08-14 NOTE — Assessment & Plan Note (Signed)
well controlled  

## 2012-08-14 NOTE — Assessment & Plan Note (Signed)
Continue symptomatic treatment

## 2012-08-14 NOTE — Assessment & Plan Note (Signed)
Her weight is down 4 lbs, but she doesn't appear dehydrated. Creatinine was stable a week ago.

## 2012-08-16 ENCOUNTER — Telehealth: Payer: Self-pay | Admitting: Family Medicine

## 2012-08-16 NOTE — Telephone Encounter (Signed)
LMOM advising pt OK to stop Zyrtec and start Allegra.

## 2012-08-16 NOTE — Telephone Encounter (Signed)
Patient wants to ask if it is ok to stop the Zyrtec and start the Allegra.  She said it is ok for a message to be left.

## 2012-08-20 ENCOUNTER — Ambulatory Visit: Payer: Medicare Other | Admitting: Family Medicine

## 2012-08-26 ENCOUNTER — Encounter: Payer: Self-pay | Admitting: Family Medicine

## 2012-08-26 DIAGNOSIS — M8580 Other specified disorders of bone density and structure, unspecified site: Secondary | ICD-10-CM | POA: Insufficient documentation

## 2012-08-27 ENCOUNTER — Encounter: Payer: Self-pay | Admitting: Home Health Services

## 2012-08-27 ENCOUNTER — Ambulatory Visit (INDEPENDENT_AMBULATORY_CARE_PROVIDER_SITE_OTHER): Payer: Medicare Other | Admitting: Home Health Services

## 2012-08-27 VITALS — BP 151/73 | HR 83 | Temp 98.4°F | Ht 59.0 in | Wt 136.3 lb

## 2012-08-27 DIAGNOSIS — Z0000 Encounter for general adult medical examination without abnormal findings: Secondary | ICD-10-CM

## 2012-08-27 DIAGNOSIS — Z23 Encounter for immunization: Secondary | ICD-10-CM

## 2012-08-27 NOTE — Patient Instructions (Signed)
1. Try eating 5 small meals a day. 2. Try using your walker when in public for stability.  3. Take your time when walking to avoid falls.

## 2012-08-27 NOTE — Progress Notes (Signed)
Patient here for annual wellness visit, patient reports: Risk Factors/Conditions needing evaluation or treatment: Pt does not have any new risk factors that need evaluation. Home Safety: Pt lives by self in 1 story home.  Pt reports having smoke detectors and adaptive equipment in bathroom. Other Information: Corrective lens: Pt does not have corrective lens.  Visits eye dr several times a year for injections. Dentures: Pt has a upper partial.  Memory: Pt reports some memory loss. Patient's Mini Mental Score (recorded in doc. flowsheet): 27  Balance/Gait: Pt has hx of falls. Balance Abnormal Patient value  Sitting balance    Sit to stand x   Attempts to arise    Immediate standing balance    Standing balance    Nudge    Eyes closed- Romberg x   Tandem stance x   Back lean x   Neck Rotation    360 degree turn    Sitting down x    Gait Abnormal Patient value  Initiation of gait    Step length-left    Step length-right    Step height-left    Step height-right    Step symmetry    Step continuity    Path deviation    Trunk movement    Walking stance x       Annual Wellness Visit Requirements Recorded Today In  Medical, family, social history Past Medical, Family, Social History Section  Current providers Care team  Current medications Medications  Wt, BP, Ht, BMI Vital signs  Tobacco, alcohol, illicit drug use History  ADL Nurse Assessment  Depression Screening Nurse Assessment  Cognitive impairment Nurse Assessment  Mini Mental Status Document Flowsheet  Fall Risk Nurse Assessment  Home Safety Progress Note  End of Life Planning (welcome visit) Social Documentation  Medicare preventative services Progress Note  Risk factors/conditions needing evaluation/treatment Progress Note  Personalized health advice Patient Instructions, goals, letter  Diet & Exercise Social Documentation  Emergency Contact Social Documentation  Seat Belts Social Documentation  Sun  exposure/protection Social Documentation    Prevention Plan:   Recommended Medicare Prevention Screenings Women over 65 Test For Frequency Date of Last- BOLD if needed  Breast Cancer 1-2 yrs 2/13  Cervical Cancer 1-3 yrs NI due oage  Colorectal Cancer 1-10 yrs NI due to age  Osteoporosis once 11/01  Cholesterol 5 yrs 4/11  Diabetes yearly 8/13  HIV yearly declined  Influenza Shot yearly 9/  Pneumonia Shot once 9/05  Zostavax Shot once 11/10

## 2012-08-28 ENCOUNTER — Encounter: Payer: Self-pay | Admitting: Home Health Services

## 2012-08-28 NOTE — Progress Notes (Signed)
Patient ID: Belinda [name], female   DOB: 1927/07/11, 78 y.o.   MRN: [id number] I have reviewed this visit and discussed with Arlys John and agree with her documentation.

## 2012-09-03 ENCOUNTER — Ambulatory Visit (INDEPENDENT_AMBULATORY_CARE_PROVIDER_SITE_OTHER): Payer: Medicare Other | Admitting: Family Medicine

## 2012-09-03 ENCOUNTER — Encounter: Payer: Self-pay | Admitting: Family Medicine

## 2012-09-03 VITALS — BP 127/70 | HR 79 | Temp 97.8°F | Ht 59.0 in | Wt 135.0 lb

## 2012-09-03 DIAGNOSIS — N184 Chronic kidney disease, stage 4 (severe): Secondary | ICD-10-CM

## 2012-09-03 DIAGNOSIS — J069 Acute upper respiratory infection, unspecified: Secondary | ICD-10-CM

## 2012-09-03 DIAGNOSIS — N189 Chronic kidney disease, unspecified: Secondary | ICD-10-CM

## 2012-09-03 DIAGNOSIS — R002 Palpitations: Secondary | ICD-10-CM

## 2012-09-03 DIAGNOSIS — R55 Syncope and collapse: Secondary | ICD-10-CM

## 2012-09-03 DIAGNOSIS — I10 Essential (primary) hypertension: Secondary | ICD-10-CM

## 2012-09-03 DIAGNOSIS — D631 Anemia in chronic kidney disease: Secondary | ICD-10-CM

## 2012-09-03 MED ORDER — FUROSEMIDE 20 MG PO TABS: 20.0000 mg | ORAL_TABLET | Freq: Every day | ORAL | Status: DC

## 2012-09-03 NOTE — Progress Notes (Signed)
  Subjective:    Patient ID: Belinda [name], female    DOB: 03/29/1927, 78 y.o.   MRN: [id number]  HPI DOE starting yesterday associated with nasal congestion. No sore throat or wheezing. No PND or orthopnea. Albuterol no help.   Edema - better with knee length elastic stockings.  Palpitations - helped by extra metoprolol half tablet. Took one earlier today.   Diabetes - fasting glucose 124  Diarrhea - none currently Review of Systems     Objective:   Physical Exam  HENT:  Right Ear: External ear normal.  Left Ear: External ear normal.  Nose: Nose normal.  Mouth/Throat: Oropharynx is clear and moist. No oropharyngeal exudate.       Mild nasal congestion  Eyes: Conjunctivae normal are normal. Right eye exhibits no discharge. Left eye exhibits no discharge.  Neck: No JVD present. No thyromegaly present.       No HJR  Cardiovascular: Normal rate and regular rhythm.   No murmur heard. Pulmonary/Chest: Effort normal and breath sounds normal. No respiratory distress. She has no wheezes. She has no rales.  Musculoskeletal: She exhibits no edema.       Wearing tight elastic stockings  Lymphadenopathy:    She has no cervical adenopathy.  Neurological: She is alert.  Psychiatric:       Appeared tired and uncomfortable          Assessment & Plan:

## 2012-09-03 NOTE — Assessment & Plan Note (Signed)
No further episodes

## 2012-09-03 NOTE — Assessment & Plan Note (Signed)
Controlled by prn extra half of Metoprolol

## 2012-09-03 NOTE — Assessment & Plan Note (Signed)
If dyspnea continues, consider worsening anemia, since not getting Epogen now

## 2012-09-03 NOTE — Patient Instructions (Addendum)
Raise head of bed on 6 inch blocks  If you are wheezing, use the albuterol with the spacer.  Your chest is clear of fluid.   I hope you feel better soon.

## 2012-09-03 NOTE — Assessment & Plan Note (Signed)
Apparent cause of her feeling of dyspnea. Continue nasal saline and Flonase.

## 2012-09-03 NOTE — Assessment & Plan Note (Signed)
Concern that dyspnea caused by volume overload, but no edema, rales, or weight gain to support that

## 2012-09-05 ENCOUNTER — Other Ambulatory Visit: Payer: Self-pay | Admitting: Family Medicine

## 2012-09-10 ENCOUNTER — Other Ambulatory Visit: Payer: Self-pay | Admitting: Family Medicine

## 2012-09-24 ENCOUNTER — Ambulatory Visit: Payer: Medicare Other | Admitting: Family Medicine

## 2012-09-25 ENCOUNTER — Ambulatory Visit (INDEPENDENT_AMBULATORY_CARE_PROVIDER_SITE_OTHER): Payer: Medicare Other | Admitting: Family Medicine

## 2012-09-25 ENCOUNTER — Encounter: Payer: Self-pay | Admitting: Family Medicine

## 2012-09-25 ENCOUNTER — Ambulatory Visit: Payer: Medicare Other | Admitting: Family Medicine

## 2012-09-25 VITALS — BP 152/78 | HR 112 | Temp 98.0°F | Ht 59.0 in | Wt 134.0 lb

## 2012-09-25 DIAGNOSIS — R5383 Other fatigue: Secondary | ICD-10-CM

## 2012-09-25 DIAGNOSIS — D631 Anemia in chronic kidney disease: Secondary | ICD-10-CM

## 2012-09-25 DIAGNOSIS — R5381 Other malaise: Secondary | ICD-10-CM

## 2012-09-25 LAB — CBC
HCT: 28.7 % — ABNORMAL LOW (ref 36.0–46.0)
Hemoglobin: 9.7 g/dL — ABNORMAL LOW (ref 12.0–15.0)
MCH: 31.6 pg (ref 26.0–34.0)
MCV: 93.5 fL (ref 78.0–100.0)
RBC: 3.07 MIL/uL — ABNORMAL LOW (ref 3.87–5.11)
WBC: 11.2 10*3/uL — ABNORMAL HIGH (ref 4.0–10.5)

## 2012-09-25 NOTE — Assessment & Plan Note (Signed)
Relatively new onset.  Could well be related to intermittent bblocker use and fluctuating pulse.  Ask her to take regularly 1/2 daily and follow up with her PCP.   May need it more frequently.  No signs of chf or bacterial infection.  Could be anemic will check.  Diarrhea seems chronic and controlled with as needed imodium.

## 2012-09-25 NOTE — Patient Instructions (Signed)
Take 1/2 of metoprolol every day do not skip doses  Use the imodium as needed.  Call us if any bleeding or if you are dehydrated.    Ask your pharmacy to send in requests for refills for Dr Sheffield Slider to review    We will check your hgb

## 2012-09-25 NOTE — Progress Notes (Signed)
  Subjective:    Patient ID: Belinda [name], female    DOB: Feb 24, 1927, 78 y.o.   MRN: [id number]  HPI  Fatigue  For the last 2 or so weeks.  Feels maybe related to her heart rate.  Has been taking her metroprolol as needed when her heart pounds will often go for days without taking - did not take today or yesterday.   No specific lightheadness or chest pain or syncope.  Also is concerned her hgb may be low given history of anemia.   No visible bleeding  Diarrhea  For the last 2 days.  Has this intermittenlty.  No nausea or vomiting or abdomen pain or blood in bowel movement.  Took imodium today which helped.  No sick contacts  Review of Symptoms - see HPI  PMH -  Reviewed last visit with Dr Sheffield Slider in early october    Review of Systems     Objective:   Physical Exam Alert no acute distress talkative  HR noted approximately 100 Able to walk from waiting room with cane without problems and get on exam table with only steadying  Heart - Regular rate and rhythm.  Gr 1/6 sys murmur no, gallops or rubs.    Lungs:  Normal respiratory effort, chest expands symmetrically. Lungs are clear to auscultation, no crackles or wheezes. Extremities:  No cyanosis, edema, or deformity noted with good range of motion of all major joints.   Neck:  No deformities, thyromegaly, masses, or tenderness noted.   Supple with full range of motion without pain. Abdomen: soft and non-tender without masses, organomegaly or hernias noted.  No guarding or rebound MM - moist         Assessment & Plan:

## 2012-10-01 ENCOUNTER — Other Ambulatory Visit: Payer: Self-pay | Admitting: Family Medicine

## 2012-10-04 HISTORY — PX: ESOPHAGEAL DILATION: SHX303

## 2012-10-09 ENCOUNTER — Telehealth: Payer: Self-pay | Admitting: Family Medicine

## 2012-10-09 NOTE — Telephone Encounter (Signed)
Dr Belinda [name] is planning UGI endoscopy for dysphagia. He is stopping Plavix for 7 days and asks if she can also stop ASA. She has occasional chest pain for which she takes NTG cardiac scan. I approved holding aspirin for one week before the procedure.

## 2012-10-10 ENCOUNTER — Encounter: Payer: Self-pay | Admitting: Family Medicine

## 2012-10-15 ENCOUNTER — Other Ambulatory Visit: Payer: Self-pay | Admitting: Family Medicine

## 2012-10-15 ENCOUNTER — Encounter: Payer: Self-pay | Admitting: Family Medicine

## 2012-10-15 ENCOUNTER — Ambulatory Visit (INDEPENDENT_AMBULATORY_CARE_PROVIDER_SITE_OTHER): Payer: Medicare Other | Admitting: Family Medicine

## 2012-10-15 VITALS — BP 148/70 | HR 66 | Ht 59.0 in | Wt 137.0 lb

## 2012-10-15 DIAGNOSIS — D631 Anemia in chronic kidney disease: Secondary | ICD-10-CM

## 2012-10-15 DIAGNOSIS — K469 Unspecified abdominal hernia without obstruction or gangrene: Secondary | ICD-10-CM

## 2012-10-15 DIAGNOSIS — N039 Chronic nephritic syndrome with unspecified morphologic changes: Secondary | ICD-10-CM

## 2012-10-15 DIAGNOSIS — I10 Essential (primary) hypertension: Secondary | ICD-10-CM

## 2012-10-15 DIAGNOSIS — K219 Gastro-esophageal reflux disease without esophagitis: Secondary | ICD-10-CM

## 2012-10-15 MED ORDER — HYDROCODONE-ACETAMINOPHEN 5-325 MG PO TABS: 1.0000 | ORAL_TABLET | ORAL | Status: DC | PRN

## 2012-10-15 MED ORDER — NIFEDIPINE ER 60 MG PO TB24: 60.0000 mg | ORAL_TABLET | Freq: Every day | ORAL | Status: DC

## 2012-10-15 NOTE — Patient Instructions (Addendum)
Stop both the Plavix and the aspirin 7 days before your procedure  Good luck with your endoscopy  Please return to see Dr Sheffield Slider in 1 month

## 2012-10-16 NOTE — Assessment & Plan Note (Signed)
She is scheduled for EGD about 10/21. Instructed her to stop ASA and Plavix 7 days before

## 2012-10-16 NOTE — Assessment & Plan Note (Signed)
She could wear the abdominal binder I ordered last year

## 2012-10-16 NOTE — Progress Notes (Signed)
  Subjective:    Patient ID: Belinda [name], female    DOB: [birth year], 78 y.o.   MRN: [id number]  HPI Diarrhea - Thinks she had food poisoning after chicken pot pie last week. Had near syncope.  Vision - getting worse despite a recent injection by Dr Gwendalyn Ege who says she is legally blind on the right  Bruising - on her forearms with minimal trauma.   Flank pain - More painful. The Tramadol doesn't help and makes her feel agitated. Insomnia worse.   Renal failure - wonders about her anemia, feels weak later in the day. Still doesn't want erythropoietin or dialysis. Cbg's have been about 120. Cutting her Nifedipine in half even though it is extended release and not scored.    see HPI for ROS  Review of Systems     Objective:   Physical Exam  Cardiovascular: Normal rate and regular rhythm.   No murmur heard. Pulmonary/Chest: Effort normal and breath sounds normal.  Abdominal: Soft. She exhibits no distension.       Large left flank bulge is mildly tender. No indications of incarceration.           Assessment & Plan:

## 2012-10-16 NOTE — Assessment & Plan Note (Signed)
Recent Hgb was 9.7 down from 9.9

## 2012-10-16 NOTE — Assessment & Plan Note (Signed)
Some of her symptoms may relate to incorrectly cutting the ER Nifedipine. Since her blood pressure is a little high, I'll replace the 90 with 60 mg ER

## 2012-10-25 ENCOUNTER — Telehealth: Payer: Self-pay | Admitting: Family Medicine

## 2012-10-25 NOTE — Telephone Encounter (Signed)
Dr. Sheffield Slider notified and he will call patient.

## 2012-10-25 NOTE — Telephone Encounter (Signed)
Spoke with patient. She had an upper endoscopy on Wednesday . Afterwards she had much burping and stomach discomfort. Used a suppository Wed afternoon to try to get rid of gas. Yesterday had vomiting 10 times , small amount at the time but she was burping and trying to make herself vomit to relieve gas discomfort.  She spoke with Dr. Madilyn Fireman' office 4 times yesterday and was told to take MOM. At 4:00 PM she had a "blow out ".  Then she had to take Imodium . Dr. Madilyn Fireman advised her to go to ED yesterday but she did not want to go.  Later was able to eat solid food. Had mac and cheese, chicken and dumplings.  Today feeling well except she is very sore in abdomen and left side.   Has called Dr. Madilyn Fireman office today  and spoke with nurse and she will call her back. She just wanted Dr. Sheffield Slider to know and ask if he thinks her soreness is due to all the vomiting, diarrhea, procedure, etc.

## 2012-10-25 NOTE — Telephone Encounter (Signed)
I spoke with her.Since she is eating and has only soreness, I agreed that she doesn't need to go to the ER unless she develops the warning signs that Dr Madilyn Fireman' office discussed with her.

## 2012-10-25 NOTE — Telephone Encounter (Signed)
Pt needs to talk to nurse about her having a lot of gas - had endoscopy and other issues and needs to talk to nurse

## 2012-10-28 ENCOUNTER — Encounter: Payer: Self-pay | Admitting: Family Medicine

## 2012-11-12 ENCOUNTER — Ambulatory Visit (INDEPENDENT_AMBULATORY_CARE_PROVIDER_SITE_OTHER): Payer: Medicare Other | Admitting: Family Medicine

## 2012-11-12 ENCOUNTER — Encounter: Payer: Self-pay | Admitting: Family Medicine

## 2012-11-12 VITALS — BP 164/68 | HR 85 | Temp 98.1°F | Ht 59.0 in | Wt 143.9 lb

## 2012-11-12 DIAGNOSIS — E118 Type 2 diabetes mellitus with unspecified complications: Secondary | ICD-10-CM

## 2012-11-12 DIAGNOSIS — N184 Chronic kidney disease, stage 4 (severe): Secondary | ICD-10-CM

## 2012-11-12 DIAGNOSIS — I10 Essential (primary) hypertension: Secondary | ICD-10-CM

## 2012-11-12 DIAGNOSIS — I2510 Atherosclerotic heart disease of native coronary artery without angina pectoris: Secondary | ICD-10-CM

## 2012-11-12 DIAGNOSIS — N039 Chronic nephritic syndrome with unspecified morphologic changes: Secondary | ICD-10-CM

## 2012-11-12 DIAGNOSIS — D631 Anemia in chronic kidney disease: Secondary | ICD-10-CM

## 2012-11-12 DIAGNOSIS — E1165 Type 2 diabetes mellitus with hyperglycemia: Secondary | ICD-10-CM

## 2012-11-12 LAB — COMPREHENSIVE METABOLIC PANEL
ALT: 13 U/L (ref 0–35)
Alkaline Phosphatase: 84 U/L (ref 39–117)
BUN: 59 mg/dL — ABNORMAL HIGH (ref 6–23)
CO2: 20 mEq/L (ref 19–32)
Chloride: 104 mEq/L (ref 96–112)
Creat: 4.38 mg/dL — ABNORMAL HIGH (ref 0.50–1.10)
Glucose, Bld: 115 mg/dL — ABNORMAL HIGH (ref 70–99)
Potassium: 4.4 mEq/L (ref 3.5–5.3)
Total Bilirubin: 0.3 mg/dL (ref 0.3–1.2)
Total Protein: 6.3 g/dL (ref 6.0–8.3)

## 2012-11-12 LAB — CBC
MCH: 32.2 pg (ref 26.0–34.0)
MCV: 95.8 fL (ref 78.0–100.0)
Platelets: 254 10*3/uL (ref 150–400)
RDW: 14.7 % (ref 11.5–15.5)

## 2012-11-12 MED ORDER — FUROSEMIDE 20 MG PO TABS: 40.0000 mg | ORAL_TABLET | Freq: Every day | ORAL | Status: DC

## 2012-11-12 NOTE — Patient Instructions (Addendum)
Please increase your furosemide to 2 of the 20 mg tablets daily  Call Dr Sheffield Slider in 2 weeks with your blood pressure reading.   Please return to see Dr Sheffield Slider in 1 month

## 2012-11-13 ENCOUNTER — Encounter: Payer: Self-pay | Admitting: Family Medicine

## 2012-11-13 NOTE — Progress Notes (Signed)
  Subjective:    Patient ID: Belinda [name], female    DOB: 04-22-1927, 78 y.o.   MRN: [id number]  HPI Back pain - The hydrocodone has been a great relief. She saw the chiropracter 5 days ago and will see him again soon. The left flank surgical hernia also hurts chronically, and recently her hips have been painful.   Vision - she will see the retinal specialist in 4 days for another injection in her eye.   Bruising - less off aspirin  Cardiac - no chest pain, scheduled to see Dr Eldridge Dace in Jan.   Stress - Son tends to irritability around time of alcoholic binges. Also stressed by confusion about payment of bills between [facility] and [facility].    see HPI for ROS  Review of Systems     Objective:   Physical Exam  Cardiovascular: Normal rate and regular rhythm.   Murmur heard. Pulmonary/Chest: Effort normal and breath sounds normal.  Musculoskeletal: She exhibits edema and tenderness.       Tender over left posterior flank hernia  Trace ankle edema  Skin: No rash noted.  Psychiatric: She has a normal mood and affect.          Assessment & Plan:

## 2012-11-13 NOTE — Assessment & Plan Note (Addendum)
Not well controlled. Weight up 7 lbs, so will increase furosemide pending labs

## 2012-11-13 NOTE — Assessment & Plan Note (Signed)
Recheck Hemoglobin

## 2012-11-13 NOTE — Assessment & Plan Note (Signed)
Anticipate increasing creatinine

## 2012-11-13 NOTE — Assessment & Plan Note (Signed)
A1c remains low

## 2012-11-13 NOTE — Assessment & Plan Note (Signed)
Asymptomatic, has a visit next month with her cardiologist

## 2012-12-02 ENCOUNTER — Telehealth: Payer: Self-pay | Admitting: Family Medicine

## 2012-12-02 ENCOUNTER — Other Ambulatory Visit: Payer: Self-pay | Admitting: Family Medicine

## 2012-12-02 DIAGNOSIS — N184 Chronic kidney disease, stage 4 (severe): Secondary | ICD-10-CM

## 2012-12-02 MED ORDER — CALCITRIOL 0.5 MCG PO CAPS: 0.5000 ug | ORAL_CAPSULE | ORAL | Status: DC

## 2012-12-02 NOTE — Telephone Encounter (Signed)
Called pt. She reports, that she has decreased appetite, nausea, feels real bad. Feels like she can't swallow sometimes and can't sleep well.  I talked with the pt for over 8 minutes and she also said, that she is able to sleep with the new pain medication. She did not take the pain medication for the last 2 days, because she was not in pain. She worries a lot and after listening to her, she said 'I know, most of it is in my head and I worry too much'. She feels depressed, because she is by herself. Her son and granddaughter are sick and won't be able to come to her house.  I told the pt, that we do not have an earlier appt with Dr.Hale for her. She has an appt with her kidney doctor on 12-23-12 and also an appt with her lawyer tomorrow. Her son will take her to the appt tomorrow. I told the pt, that I will send this info to Dr.Hale for review. Pt agreed and sounded some better. Lorenda Hatchet, Renato Battles

## 2012-12-02 NOTE — Telephone Encounter (Signed)
Patient is calling wishing to speak to Dr. Sheffield Slider because she thinks she is getting worse.  She wanted to schedule to get in to see him sooner than 1/7, but he doesn't have anything and she doesn't want to see anyone else.  She wants to speak to Dr. Sheffield Slider.

## 2012-12-02 NOTE — Telephone Encounter (Signed)
Called pt.

## 2012-12-02 NOTE — Telephone Encounter (Signed)
I spoke with her on the phone. She still doesn't want dialysis or erythropoietin replacement. I informed her that I will be in the office teaching Jan 2&3 and will be able to see her if she is feeling more ill then.

## 2012-12-10 ENCOUNTER — Encounter: Payer: Self-pay | Admitting: Family Medicine

## 2012-12-10 ENCOUNTER — Ambulatory Visit (INDEPENDENT_AMBULATORY_CARE_PROVIDER_SITE_OTHER): Payer: Medicare Other | Admitting: Family Medicine

## 2012-12-10 VITALS — BP 165/70 | HR 73 | Ht 59.0 in | Wt 143.0 lb

## 2012-12-10 DIAGNOSIS — N184 Chronic kidney disease, stage 4 (severe): Secondary | ICD-10-CM

## 2012-12-10 DIAGNOSIS — I10 Essential (primary) hypertension: Secondary | ICD-10-CM

## 2012-12-10 DIAGNOSIS — M48061 Spinal stenosis, lumbar region without neurogenic claudication: Secondary | ICD-10-CM

## 2012-12-10 MED ORDER — HYDROCODONE-ACETAMINOPHEN 5-325 MG PO TABS: 1.0000 | ORAL_TABLET | ORAL | Status: DC | PRN

## 2012-12-10 NOTE — Progress Notes (Signed)
  Subjective:    Patient ID: Belinda [name], female    DOB: [birth year], 79 y.o.   MRN: [id number]  HPI Back pain - After sitting or sleeping she develops low back pain that she says is coming from her hips. It improves with movement and with chiropractic adjustments, but the pain quickly recurs. She doesn't take pain medication at night because they make her sleep worse. Takes a half of a hydrocodone tablet on some days. She's getting a new mattress.   Renal failure - had labs done yesterday and will see Dr Caryn Section later this week.   shortness of breath - seems worse with exertion. Not having increased edema. Gets thick phlegm in her throat.   Palpitations - takes Metoprolol when these occur every few days.   Ears - complains of discomfort, but no itching or pain Review of Systems     Objective:   Physical Exam  Constitutional: She appears well-developed and well-nourished.  HENT:  Right Ear: External ear normal.  Left Ear: External ear normal.       TYMPANIC MEMBRANE's normal  Cardiovascular: Normal rate and regular rhythm.   Murmur heard.      G 2/6 SEM LSB  Pulmonary/Chest: Effort normal and breath sounds normal.  Abdominal: She exhibits no distension. There is no tenderness. There is no rebound.  Musculoskeletal: She exhibits no edema.       Good rotation of hips with slight limitation of external rotation on left   Limited flexion/extension of low back with discomfort localized to the SI area, but FABERE didn't cause pain there  Neurological: She is alert.  Psychiatric:       Usual dysphoric affect, but brightens up quickly          Assessment & Plan:

## 2012-12-10 NOTE — Assessment & Plan Note (Signed)
I suggested acetaminophen at 5 AM when she awakens to help when she arises later. No evidence of adverse effects from the chiropractic treatments.

## 2012-12-10 NOTE — Assessment & Plan Note (Signed)
Will ask Dr Caryn Section to determine treatment of blood pressure in context of the labs results and renal function

## 2012-12-10 NOTE — Assessment & Plan Note (Signed)
For follow up with Dr Caryn Section this week

## 2012-12-10 NOTE — Patient Instructions (Addendum)
Try taking acetaminophen at 5 AM to decrease the spasm in your back when you arise.   Please return to see Dr Sheffield Slider in 1 month

## 2012-12-19 ENCOUNTER — Other Ambulatory Visit: Payer: Self-pay | Admitting: Family Medicine

## 2013-01-07 ENCOUNTER — Ambulatory Visit (INDEPENDENT_AMBULATORY_CARE_PROVIDER_SITE_OTHER): Payer: Medicare Other | Admitting: Family Medicine

## 2013-01-07 ENCOUNTER — Encounter: Payer: Self-pay | Admitting: Family Medicine

## 2013-01-07 VITALS — BP 150/75 | HR 78 | Ht 59.0 in | Wt 142.0 lb

## 2013-01-07 DIAGNOSIS — I10 Essential (primary) hypertension: Secondary | ICD-10-CM

## 2013-01-07 DIAGNOSIS — N039 Chronic nephritic syndrome with unspecified morphologic changes: Secondary | ICD-10-CM

## 2013-01-07 DIAGNOSIS — F458 Other somatoform disorders: Secondary | ICD-10-CM

## 2013-01-07 DIAGNOSIS — D631 Anemia in chronic kidney disease: Secondary | ICD-10-CM

## 2013-01-07 DIAGNOSIS — N184 Chronic kidney disease, stage 4 (severe): Secondary | ICD-10-CM

## 2013-01-07 NOTE — Patient Instructions (Signed)
Please return to see Dr Sheffield Slider in 1 month. Please come in sooner if not feeling well.   I hope you feel better after your chiropractic treatment.

## 2013-01-07 NOTE — Assessment & Plan Note (Signed)
Hemoglobin of 8.2 at Dr Caryn Section. Likely contributing to dyspnea.

## 2013-01-07 NOTE — Progress Notes (Signed)
  Subjective:    Patient ID: Belinda [name], female    DOB: 1927-04-02, 79 y.o.   MRN: [id number]  HPI GI - burping more again  CRF - Dr Caryn Section again documented her desire not to have dialysis. Lightheaded when lying or walking some days  Anemia - Aranesp was too expensive. Feeling more tired and shortness of breath. Albuterol MDI helps.   Constipation - can't urinate unless she moves her bowels which she can do after a glycerine suppository.  Back and hip pain - chiropractic helps for awhile and see's going today.  Review of Systems     Objective:   Physical Exam  Constitutional: She appears well-developed and well-nourished.       Occasional burp  Cardiovascular: Normal rate and regular rhythm.   Murmur heard.      Grade 2/6  Pulmonary/Chest: Effort normal and breath sounds normal.  Abdominal:       Large left posterior lateral flank bulge at site of nephrectomy  Musculoskeletal: She exhibits no edema.  Neurological: She is alert.          Assessment & Plan:

## 2013-01-07 NOTE — Assessment & Plan Note (Signed)
Mild recurrence

## 2013-01-07 NOTE — Assessment & Plan Note (Signed)
Fair control.

## 2013-01-07 NOTE — Assessment & Plan Note (Signed)
Last creatinine a little better at 4.25 on 12/10/12

## 2013-01-23 ENCOUNTER — Telehealth: Payer: Self-pay | Admitting: *Deleted

## 2013-01-23 NOTE — Telephone Encounter (Signed)
[facility] calling because patient reports feeling a lump in right breast.  They would like a verbal order to perform a bilateral diagnostic mammogram.  Ok per Dr. Sheffield Slider.  They will fax form for Dr. Sheffield Slider to sign and return.  Gaylene Brooks, RN

## 2013-01-28 ENCOUNTER — Encounter: Payer: Self-pay | Admitting: Family Medicine

## 2013-01-28 ENCOUNTER — Ambulatory Visit (INDEPENDENT_AMBULATORY_CARE_PROVIDER_SITE_OTHER): Payer: Medicare Other | Admitting: Family Medicine

## 2013-01-28 VITALS — BP 182/67 | HR 71 | Ht 59.0 in | Wt 142.0 lb

## 2013-01-28 DIAGNOSIS — N039 Chronic nephritic syndrome with unspecified morphologic changes: Secondary | ICD-10-CM

## 2013-01-28 DIAGNOSIS — R1011 Right upper quadrant pain: Secondary | ICD-10-CM

## 2013-01-28 DIAGNOSIS — E1165 Type 2 diabetes mellitus with hyperglycemia: Secondary | ICD-10-CM

## 2013-01-28 DIAGNOSIS — D631 Anemia in chronic kidney disease: Secondary | ICD-10-CM

## 2013-01-28 DIAGNOSIS — N184 Chronic kidney disease, stage 4 (severe): Secondary | ICD-10-CM

## 2013-01-28 LAB — POCT URINALYSIS DIPSTICK
Glucose, UA: NEGATIVE
Nitrite, UA: NEGATIVE
Protein, UA: 100
Spec Grav, UA: 1.015
Urobilinogen, UA: 0.2

## 2013-01-28 LAB — COMPREHENSIVE METABOLIC PANEL
Albumin: 4.1 g/dL (ref 3.5–5.2)
Alkaline Phosphatase: 82 U/L (ref 39–117)
BUN: 81 mg/dL — ABNORMAL HIGH (ref 6–23)
CO2: 18 mEq/L — ABNORMAL LOW (ref 19–32)
Calcium: 10.1 mg/dL (ref 8.4–10.5)
Chloride: 106 mEq/L (ref 96–112)
Glucose, Bld: 205 mg/dL — ABNORMAL HIGH (ref 70–99)
Potassium: 4.4 mEq/L (ref 3.5–5.3)
Sodium: 138 mEq/L (ref 135–145)
Total Protein: 6.5 g/dL (ref 6.0–8.3)

## 2013-01-28 LAB — POCT UA - MICROSCOPIC ONLY

## 2013-01-28 LAB — CBC
MCH: 31.4 pg (ref 26.0–34.0)
MCHC: 33.2 g/dL (ref 30.0–36.0)
MCV: 94.6 fL (ref 78.0–100.0)
Platelets: 204 10*3/uL (ref 150–400)
RBC: 2.77 MIL/uL — ABNORMAL LOW (ref 3.87–5.11)
RDW: 15.2 % (ref 11.5–15.5)

## 2013-01-28 LAB — FERRITIN: Ferritin: 216 ng/mL (ref 10–291)

## 2013-01-28 MED ORDER — CLOPIDOGREL BISULFATE 75 MG PO TABS: 75.0000 mg | ORAL_TABLET | Freq: Every day | ORAL | Status: DC

## 2013-01-28 NOTE — Progress Notes (Signed)
  Subjective:    Patient ID: Belinda [name], female    DOB: [birth year], 79 y.o.   MRN: [id number]  HPI She feels increasingly weak and tired with poor appetite.  Right sided abdominal pain x 1 month. She thought it was also in her right breast, but recent mammogram was normal. Remote cholecystectomy No current constipation, diarrhea, dysuria or signs of hematuria or hematochezia. Back pain, but it is improved.    Review of Systems     Objective:   Physical Exam  Cardiovascular: Normal rate and regular rhythm.   Pulmonary/Chest: Effort normal and breath sounds normal. She has no wheezes. She has no rales.  Abdominal: There is tenderness. There is guarding. There is no rebound.  Very tender over right side of abdomen and lower right ribs.           Assessment & Plan:

## 2013-01-28 NOTE — Assessment & Plan Note (Signed)
Consider problem with her right kidney, so will check urine and CMET. Consider abdomen ultrasound. Also consider radicular pain from thoracic compression fracture, but not tender back there.

## 2013-01-28 NOTE — Assessment & Plan Note (Signed)
Not decreased further and doesn't need a blood transfusion. Iron studies pending

## 2013-01-28 NOTE — Assessment & Plan Note (Signed)
Stable off medication.

## 2013-01-28 NOTE — Patient Instructions (Addendum)
I will call with your lab results  Please check your stools for blood with the cards I gave you.

## 2013-01-28 NOTE — Assessment & Plan Note (Signed)
Creatinine increased and CO2 down to 17. CRF is probably the main cause of her feeling bac.

## 2013-02-10 ENCOUNTER — Telehealth: Payer: Self-pay | Admitting: Family Medicine

## 2013-02-10 LAB — POC HEMOCCULT BLD/STL (HOME/3-CARD/SCREEN): Card #2 Fecal Occult Blod, POC: NEGATIVE

## 2013-02-10 NOTE — Addendum Note (Signed)
Addended by: [place], BONNIE on: 02/10/2013 05:56 PM   Modules accepted: Orders

## 2013-02-10 NOTE — Telephone Encounter (Signed)
Patients wants to know the results of her lab tests.  Please call her.

## 2013-02-10 NOTE — Telephone Encounter (Signed)
Fwd. To PCP for review .Slade, Thekla  

## 2013-02-12 NOTE — Telephone Encounter (Signed)
I reviewed her labs with her and discussed the worsening creatinine and that kidney failure is likely the main cause of her feeling bad. She is having abdominal discomfort and I suggested that she come to [facility] clinic this week if it doesn't improve.

## 2013-02-13 ENCOUNTER — Ambulatory Visit (INDEPENDENT_AMBULATORY_CARE_PROVIDER_SITE_OTHER): Payer: Medicare Other | Admitting: Family Medicine

## 2013-02-13 VITALS — BP 142/60 | HR 70 | Ht 59.0 in | Wt 142.0 lb

## 2013-02-13 DIAGNOSIS — R1011 Right upper quadrant pain: Secondary | ICD-10-CM

## 2013-02-13 DIAGNOSIS — M25519 Pain in unspecified shoulder: Secondary | ICD-10-CM

## 2013-02-13 DIAGNOSIS — D631 Anemia in chronic kidney disease: Secondary | ICD-10-CM

## 2013-02-13 DIAGNOSIS — M25512 Pain in left shoulder: Secondary | ICD-10-CM

## 2013-02-13 DIAGNOSIS — N184 Chronic kidney disease, stage 4 (severe): Secondary | ICD-10-CM

## 2013-02-13 DIAGNOSIS — N039 Chronic nephritic syndrome with unspecified morphologic changes: Secondary | ICD-10-CM

## 2013-02-13 MED ORDER — METHYLPREDNISOLONE ACETATE 40 MG/ML IJ SUSP
40.0000 mg | Freq: Once | INTRAMUSCULAR | Status: AC
  Administered 2013-02-13: 40 mg via INTRA_ARTICULAR

## 2013-02-13 NOTE — Assessment & Plan Note (Signed)
Repeat injection today.  Also advised to schedule tylenol, continue hydrocodone PRN.

## 2013-02-13 NOTE — Progress Notes (Signed)
  Subjective:    Patient ID: Belinda [name], female    DOB: 07-10-1927, 79 y.o.   MRN: [id number]  HPI:  Ms. Lobue comes in to [facility] for follow up of blood work and L>R shoulder pain  Shoulder Pain: going on for 3 weeks, no acute injury or fall (last fall about 7 months ago), but says that she does go to the chiropractor every other week for manipulation and he does push on her shoulders.  She says the pain is worst at night because she cannot get comfortable to sleep.  She says she can still brush her teeth but does not do her own hair.  She denies numbness or tingling in her hands/arms but does endorse the pain radiating down her arms.   Past Medical History  Diagnosis Date  . Stenosis, spinal, lumbar 05/20/2006    L 3-4, 4-5  MRI  . Bilateral sensorineural hearing loss 10/2006    audiogram  . Diabetes mellitus   . Chronic kidney disease   . Diverticulosis of colon   . H/O endoscopy 11/2006    benign duodenal scar  . Normal cardiac stress test 01/30/2006    Cardiolite EF 54%  . Abnormal barium swallow 12/2009    ?thyroid nodule    History  Substance Use Topics  . Smoking status: Never Smoker   . Smokeless tobacco: Never Used  . Alcohol Use: No    Family History  Problem Relation Age of Onset  . Asthma Son   . Cancer Father     brain tumor  . Diabetes Father   . Cancer Mother     liver     ROS Pertinent items in HPI    Objective:  Physical Exam:  BP 142/60  Pulse 70  Ht 4\' 11"  (1.499 m)  Wt 142 lb (64.411 kg)  BMI 28.67 kg/m2 General appearance: alert, cooperative and no distress Head: Normocephalic, without obvious abnormality, atraumatic Neck: tender left lateral musculature, full range of motion with pain on the side but not moving into the arms.  Lungs: clear to auscultation bilaterally, very tender over right anterior lateral lower ribs. Heart: regular rate and rhythm, S1, S2 normal, no murmur, click, rub or gallop Abdomen soft, large left  posterior Pulses: 2+ and symmetric  Left Shoulder: Inspection reveals no abnormalities, atrophy or asymmetry. Palpation is uncomfortable diffusely, but most intense over bicipital groove ROM is limited in IR (cannot touch back) and with overhead abduction (to about 160 degrees) Rotator cuff strength slightly decreased (4/5) throughout, exam limited by pain + Pain Neer and Hawkin's tests, empty can, but 4/5 strength with empty can.  Right Shoulder: Inspection reveals no abnormalities, atrophy or asymmetry. Palpation is uncomfortable diffusely, but most intense over bicipital groove ROM is limited in IR is to L4 and with overhead abduction (to about 160 degrees) Rotator cuff strength normal throughout, but she does have pain with all strength testing.  + Pain Neer and Hawkin's tests, empty can, but 5/5 strength with empty can.  Left shoulder Injection: Consent obtained and verified. Skin cleansed with alcohol. Topical analgesic spray: Ethyl chloride. Joint:Left shoulder Approached in typical fashion with: Posterior approach Completed without difficulty Meds: 40 mg Depo Medrol, 2 cc's 1% Lidocaine Needle:21 gauge 1/5 inch Aftercare instructions and Red flags advised.     Assessment & Plan:

## 2013-02-13 NOTE — Assessment & Plan Note (Signed)
Very tender over her right anterior lower ribs.

## 2013-02-13 NOTE — Patient Instructions (Addendum)
Please return to see Dr Sheffield Slider in 1 month. Call me if you develop vomiting or  I'll send Dr Caryn Section a letter with the recent lab tests.  I'll send a letter to Dr Alvan Dame regarding your shoulder injection.

## 2013-02-13 NOTE — Assessment & Plan Note (Signed)
Part of fatigue relates to her anemia, but it didn't worsen last test. She had 2/3 positive hemacults, but hasn't seen gross blood.

## 2013-02-13 NOTE — Assessment & Plan Note (Signed)
Her renal failure and azotemia are worsening. She still doesn't want dialysis. I will send the lab results to Dr Caryn Section in case he wants her to have repeat labs or start phosphate binders, etc before the April 11th lab visit that he has scheduled.

## 2013-02-17 ENCOUNTER — Telehealth: Payer: Self-pay | Admitting: Family Medicine

## 2013-02-17 NOTE — Telephone Encounter (Signed)
Will fwd. To Dr.Hale for review. .Tareva Leske  

## 2013-02-17 NOTE — Telephone Encounter (Signed)
Pt is asking to speak with Dr Sheffield Slider - is still have breast pain and wants to get a second opinion - wants to know what to do. Hurts for her to breath

## 2013-02-18 ENCOUNTER — Ambulatory Visit: Payer: Medicare Other | Admitting: Family Medicine

## 2013-02-24 ENCOUNTER — Encounter: Payer: Self-pay | Admitting: Family Medicine

## 2013-02-24 ENCOUNTER — Ambulatory Visit (INDEPENDENT_AMBULATORY_CARE_PROVIDER_SITE_OTHER): Payer: Medicare Other | Admitting: Family Medicine

## 2013-02-24 VITALS — BP 152/69 | HR 69 | Temp 97.8°F | Ht 59.0 in | Wt 136.6 lb

## 2013-02-24 DIAGNOSIS — D631 Anemia in chronic kidney disease: Secondary | ICD-10-CM

## 2013-02-24 DIAGNOSIS — N039 Chronic nephritic syndrome with unspecified morphologic changes: Secondary | ICD-10-CM

## 2013-02-24 DIAGNOSIS — R0789 Other chest pain: Secondary | ICD-10-CM

## 2013-02-24 DIAGNOSIS — N184 Chronic kidney disease, stage 4 (severe): Secondary | ICD-10-CM

## 2013-02-24 DIAGNOSIS — R071 Chest pain on breathing: Secondary | ICD-10-CM

## 2013-02-24 LAB — CBC
MCV: 89.4 fL (ref 78.0–100.0)
Platelets: 225 10*3/uL (ref 150–400)
RDW: 16.1 % — ABNORMAL HIGH (ref 11.5–15.5)
WBC: 12.5 10*3/uL — ABNORMAL HIGH (ref 4.0–10.5)

## 2013-02-24 LAB — COMPREHENSIVE METABOLIC PANEL
ALT: 13 U/L (ref 0–35)
AST: 25 U/L (ref 0–37)
Calcium: 10.2 mg/dL (ref 8.4–10.5)
Chloride: 106 mEq/L (ref 96–112)
Creat: 4.7 mg/dL — ABNORMAL HIGH (ref 0.50–1.10)
Sodium: 142 mEq/L (ref 135–145)
Total Bilirubin: 0.3 mg/dL (ref 0.3–1.2)

## 2013-02-24 NOTE — Assessment & Plan Note (Signed)
Likely becoming more azotemic, causing GI symptoms

## 2013-02-24 NOTE — Assessment & Plan Note (Addendum)
Thinks she sees blood in her stools some days. On rectal, stool brown and heme neg. CBC done

## 2013-02-24 NOTE — Progress Notes (Signed)
  Subjective:    Patient ID: Belinda [name], female    DOB: [birth year], 79 y.o.   MRN: [id number]  HPI Right chest pain - She continues right anterior/lateral chest pain for the past month without a history of trauma. Increased with deep breath. Hasn't been coughing, but dyspnea on exertion walking in from the parking lot and other short distances. Feels like there are sore nodules in her right breast, but a recent mammogram was negative.   Abdominal discomfort - pain is poorly localized, poor appetite, weight down 5.5 lbs, burping, sleepy. Takes 1/2 of a hydrocodone tablet. Tramadol no help.   Anemia - feels like she's going to faint at times. Believes she saw bright red blood in her stool a couple days ago.   Neck and left shoulder - still painful, but less since the injection Review of Systems     Objective:   Physical Exam  Constitutional:  Chronically ill appearing, weak  HENT:  Mouth somewhat dry  Cardiovascular: Normal rate and regular rhythm.   No murmur heard. Pulmonary/Chest: Effort normal and breath sounds normal. She has no wheezes. She has no rales. She exhibits tenderness.  Very tender anterior/lateral lower right ribs to palpation. No palpable masses in right breast. Skin normal   Genitourinary: Guaiac negative stool.  Rectal - good sphincter tone, normal formed brown stool  Musculoskeletal: She exhibits no edema.  Skin: Rash noted. There is pallor.  Small clusters of papules perianally          Assessment & Plan:

## 2013-02-24 NOTE — Assessment & Plan Note (Signed)
rule out rib fractures

## 2013-02-24 NOTE — Patient Instructions (Addendum)
I will call when I have the lab and chest xray results.   Please read the fall reduction information that I gave you today.

## 2013-02-25 ENCOUNTER — Ambulatory Visit (HOSPITAL_COMMUNITY)
Admission: RE | Admit: 2013-02-25 | Discharge: 2013-02-25 | Disposition: A | Discharge status: Home / Self Care | Payer: Medicare Other | Source: Ambulatory Visit | Attending: Family Medicine | Admitting: Family Medicine

## 2013-02-25 ENCOUNTER — Telehealth: Payer: Self-pay | Admitting: Family Medicine

## 2013-02-25 DIAGNOSIS — I10 Essential (primary) hypertension: Secondary | ICD-10-CM

## 2013-02-25 DIAGNOSIS — R079 Chest pain, unspecified: Secondary | ICD-10-CM | POA: Insufficient documentation

## 2013-02-25 DIAGNOSIS — M19019 Primary osteoarthritis, unspecified shoulder: Secondary | ICD-10-CM | POA: Insufficient documentation

## 2013-02-25 DIAGNOSIS — M47814 Spondylosis without myelopathy or radiculopathy, thoracic region: Secondary | ICD-10-CM | POA: Insufficient documentation

## 2013-02-25 DIAGNOSIS — R0789 Other chest pain: Secondary | ICD-10-CM

## 2013-02-25 MED ORDER — FUROSEMIDE 20 MG PO TABS: 20.0000 mg | ORAL_TABLET | Freq: Every day | ORAL | Status: DC

## 2013-02-25 NOTE — Telephone Encounter (Signed)
I called and informed her that the chest xray and right chest for ribs was negative. She reports continued pain in the chest with cough and movement. Also says the right lower leg hurts which is chronic. Still no ankle edema. Her appetite remains poor and I OK'd drinking almond milk shakes and cow's milk, but not protein shakes due to her high BUN. Her hgb is a little higher and creatinine lower, but with her decreased weight I believe that she is over diuresed. She agreed to take her hydrocodone more regularly and to decrease furosemide to one 20 mg tablet daily. She will let me know if she develops ankle edema before her April 8th appointment.

## 2013-03-19 ENCOUNTER — Other Ambulatory Visit: Payer: Self-pay | Admitting: Family Medicine

## 2013-03-25 ENCOUNTER — Encounter: Payer: Self-pay | Admitting: Family Medicine

## 2013-03-25 ENCOUNTER — Ambulatory Visit (INDEPENDENT_AMBULATORY_CARE_PROVIDER_SITE_OTHER): Payer: Medicare Other | Admitting: Family Medicine

## 2013-03-25 VITALS — BP 166/70 | HR 64 | Ht 59.0 in | Wt 136.0 lb

## 2013-03-25 DIAGNOSIS — M25512 Pain in left shoulder: Secondary | ICD-10-CM

## 2013-03-25 DIAGNOSIS — R071 Chest pain on breathing: Secondary | ICD-10-CM

## 2013-03-25 DIAGNOSIS — M19019 Primary osteoarthritis, unspecified shoulder: Secondary | ICD-10-CM | POA: Insufficient documentation

## 2013-03-25 DIAGNOSIS — R0789 Other chest pain: Secondary | ICD-10-CM

## 2013-03-25 DIAGNOSIS — M25519 Pain in unspecified shoulder: Secondary | ICD-10-CM

## 2013-03-25 MED ORDER — METHYLPREDNISOLONE ACETATE 40 MG/ML IJ SUSP
40.0000 mg | Freq: Once | INTRAMUSCULAR | Status: AC
  Administered 2013-03-25: 40 mg via INTRA_ARTICULAR

## 2013-03-25 NOTE — Assessment & Plan Note (Signed)
After consent and sterile preparation, the left AC joint was injected with 1 cc depomedrol and 1 cc xylocaine.  If no improvement will refer to Michiel Sites at [facility]

## 2013-03-25 NOTE — Progress Notes (Signed)
  Subjective:    Patient ID: Belinda [name], female    DOB: 08-Aug-1927, 79 y.o.   MRN: [id number]  HPI right shoulder pain - This briefly improved after the injection 5 weeks ago, but is now worse in front. She's had 2 chiropractic appointments that helped a little, but it remains very painful to move her arm.  CRF - she saw Dr Caryn Section who reviewed her labs and sent [place] a copy. He informed her that her kidney function is a little better. He will be retiring the end of June.   Palpitations have been frequent. She has made and appointment with Dr Eldridge Dace, her cardiologist  Review of Systems     Objective:   Physical Exam  Constitutional: She is oriented to person, place, and time.  Elderly and chronically ill appearing  Cardiovascular: Normal rate and regular rhythm.   Pulmonary/Chest: Effort normal and breath sounds normal.  Musculoskeletal:  Very tender left anterior shoulder, particularly over the [facility] joint. Pain reproduced by reach across test. External rotation and abduction also limited by pain.   Neurological: She is alert and oriented to person, place, and time.  Psychiatric: She has a normal mood and affect. Her behavior is normal.          Assessment & Plan:

## 2013-03-25 NOTE — Assessment & Plan Note (Signed)
She now realizes that it was from reaching things from her chair and it has resolved

## 2013-03-25 NOTE — Patient Instructions (Addendum)
Come back for recheck in one month  Call sooner if you have any signs of infection in your shoulder. If the shoulder injection doesn't last long, call for a prescription for Lidoderm patches.

## 2013-04-03 ENCOUNTER — Telehealth: Payer: Self-pay | Admitting: Family Medicine

## 2013-04-03 DIAGNOSIS — M19019 Primary osteoarthritis, unspecified shoulder: Secondary | ICD-10-CM

## 2013-04-03 MED ORDER — LIDOCAINE 5 % EX PTCH: 1.0000 | MEDICATED_PATCH | CUTANEOUS | Status: DC

## 2013-04-03 NOTE — Telephone Encounter (Signed)
Will fwd. To Dr. Sheffield Slider. Lorenda Hatchet, Renato Battles

## 2013-04-03 NOTE — Telephone Encounter (Signed)
Patient is calling requesting Dr. Sheffield Slider order the pain patches for her shoulder.

## 2013-04-03 NOTE — Assessment & Plan Note (Signed)
I had discussed lidoderm patches if no improvement after injection

## 2013-04-03 NOTE — Telephone Encounter (Signed)
I had discussed lidoderm patches if no improvement after injection 

## 2013-04-08 ENCOUNTER — Encounter: Payer: Self-pay | Admitting: Family Medicine

## 2013-04-08 ENCOUNTER — Ambulatory Visit (INDEPENDENT_AMBULATORY_CARE_PROVIDER_SITE_OTHER): Payer: Medicare Other | Admitting: Family Medicine

## 2013-04-08 VITALS — BP 150/58 | HR 74 | Ht 59.0 in | Wt 133.9 lb

## 2013-04-08 DIAGNOSIS — M19019 Primary osteoarthritis, unspecified shoulder: Secondary | ICD-10-CM

## 2013-04-08 DIAGNOSIS — L219 Seborrheic dermatitis, unspecified: Secondary | ICD-10-CM | POA: Insufficient documentation

## 2013-04-08 NOTE — Patient Instructions (Addendum)
Please return to see Dr Sheffield Slider in 1 month  Good to see you today.

## 2013-04-09 ENCOUNTER — Telehealth: Payer: Self-pay | Admitting: Family Medicine

## 2013-04-09 MED ORDER — HYDROCORTISONE-ACETIC ACID 1-2 % OT SOLN: 3.0000 [drp] | Freq: Two times a day (BID) | OTIC | Status: DC

## 2013-04-09 NOTE — Telephone Encounter (Signed)
Will forward to Dr Hale 

## 2013-04-09 NOTE — Progress Notes (Signed)
  Subjective:    Patient ID: Belinda [name], female    DOB: Oct 08, 1927, 79 y.o.   MRN: [id number]  HPI L shoulder pain - the [facility] injection helped the pain in that area, but she continues lateral shoulder pain and needs help knowing how to apply the lidocaine patch.   Ears - chronically have dry skin inside which irritates her. She denies much pain or itching. Avoids putting anything in the ears. She chronically has had to use baby shampoo on her eyelids to remove scales as recommended by her opthalmologist  Weight loss - she's trying to eat more, but things don't taste good.   Allergic rhinitis - controlled by Citrarazine and Flonase Review of Systems     Objective:   Physical Exam  HENT:  Right Ear: External ear normal.  Left Ear: External ear normal.  Minimal cerumen with no inflammation and only superficial desquamation. Mild inflammation of eyelids.   Eyes: Conjunctivae are normal.  Cardiovascular: Normal rate and regular rhythm.   Murmur heard. Pulmonary/Chest: Effort normal and breath sounds normal. She has no wheezes. She has no rales.  Musculoskeletal:  Painful abduction and external rotation and very tender in supraspinatus area.           Assessment & Plan:

## 2013-04-09 NOTE — Telephone Encounter (Signed)
Pt was here yesterday and Dr Sheffield Slider was supposed to call in ear drops for her.  Needs it today please [facility] [place]

## 2013-04-09 NOTE — Assessment & Plan Note (Signed)
Improved after injection, but continued supraspinatus pain. I taught her and her son how to place the Lidoderm patch over the lateral shoulder to cover that area

## 2013-04-30 ENCOUNTER — Telehealth: Payer: Self-pay | Admitting: Family Medicine

## 2013-04-30 NOTE — Telephone Encounter (Signed)
Pt is wearing a pain patch on her shoulder and is not sure if that is making her nauseated and feeling sick.  Wants to know if this has anything thig to do with it

## 2013-04-30 NOTE — Telephone Encounter (Signed)
Left message on identifiable voice mail - pt could remove patch and see if nausea clear up but it should not be making her nauseated. Please call for further questions. Wyatt Haste, RN-BSN

## 2013-05-07 ENCOUNTER — Telehealth: Payer: Self-pay | Admitting: Family Medicine

## 2013-05-07 NOTE — Telephone Encounter (Signed)
Her son had left a message on my home machine that she wasn't doing well. I called today and her main problem is acid reflux causing burning in the throat and hoarse voice. Briefly improved by ice cream or Tums. Protonix isn't helping. Was too week to make the workin appointment with Dr Madilyn Fireman yesterday. Realizes much of this may be from her renal failure. Declines hospitalization. Also has a new hernia on left side and diarrheal incontinence. Plans to try to get out to see her chiropractor if possible.   I recommended a trial of Zegerid. She may come to [facility] tomorrow AM.

## 2013-05-13 ENCOUNTER — Encounter: Payer: Self-pay | Admitting: Family Medicine

## 2013-05-13 ENCOUNTER — Ambulatory Visit (INDEPENDENT_AMBULATORY_CARE_PROVIDER_SITE_OTHER): Payer: Medicare Other | Admitting: Family Medicine

## 2013-05-13 VITALS — BP 155/75 | HR 80 | Ht 59.0 in | Wt 132.0 lb

## 2013-05-13 DIAGNOSIS — M8580 Other specified disorders of bone density and structure, unspecified site: Secondary | ICD-10-CM

## 2013-05-13 DIAGNOSIS — E118 Type 2 diabetes mellitus with unspecified complications: Secondary | ICD-10-CM

## 2013-05-13 DIAGNOSIS — D631 Anemia in chronic kidney disease: Secondary | ICD-10-CM

## 2013-05-13 DIAGNOSIS — N185 Chronic kidney disease, stage 5: Secondary | ICD-10-CM

## 2013-05-13 DIAGNOSIS — M949 Disorder of cartilage, unspecified: Secondary | ICD-10-CM

## 2013-05-13 DIAGNOSIS — N039 Chronic nephritic syndrome with unspecified morphologic changes: Secondary | ICD-10-CM

## 2013-05-13 MED ORDER — SIMVASTATIN 20 MG PO TABS: ORAL_TABLET | ORAL | Status: DC

## 2013-05-13 NOTE — Assessment & Plan Note (Addendum)
Likely cause of her fatigue. BMET for creatinine, etc Will decrease some medications effected by CRF

## 2013-05-13 NOTE — Progress Notes (Signed)
  Subjective:    Patient ID: Belinda [name], female    DOB: January 09, 1927, 79 y.o.   MRN: [id number]  Allergic Reaction   GERD - she believes this is the cause of her hoarse voice. Constant low level substernal burning, so she just recently added Famotidine to her PPI. She didn't get to see Dr Madilyn Fireman, but he had recommended to her son something that started with a C, likely Carafate.   Diarrhea - Two recent episodes, the first of which caused her to miss her appointment with Dr Madilyn Fireman.   Weak - Has problems getting up. No falls. Would like to have her anemia checked.  Palpitations - Will see Dr Valda Favia in August, but will try to move it up   Review of Systems     Objective:   Physical Exam  Constitutional:  Weak and chronically ill appearing  Cardiovascular: Normal rate and regular rhythm.   No murmur heard. Pulmonary/Chest: Effort normal and breath sounds normal. She has no wheezes. She has no rales.  Musculoskeletal: She exhibits no edema.  Neurological: She is alert.  Psychiatric:  Anxious and depressed affect          Assessment & Plan:

## 2013-05-13 NOTE — Patient Instructions (Signed)
Please return to see Dr Sheffield Slider in 1 month  I will call you with the lab result reports.

## 2013-05-14 LAB — CBC
HCT: 26.8 % — ABNORMAL LOW (ref 36.0–46.0)
MCH: 31.8 pg (ref 26.0–34.0)
MCHC: 32.8 g/dL (ref 30.0–36.0)
MCV: 96.8 fL (ref 78.0–100.0)
Platelets: 203 10*3/uL (ref 150–400)
RDW: 15.5 % (ref 11.5–15.5)

## 2013-05-14 LAB — COMPREHENSIVE METABOLIC PANEL
ALT: 11 U/L (ref 0–35)
AST: 22 U/L (ref 0–37)
Alkaline Phosphatase: 54 U/L (ref 39–117)
BUN: 66 mg/dL — ABNORMAL HIGH (ref 6–23)
Calcium: 11.7 mg/dL — ABNORMAL HIGH (ref 8.4–10.5)
Creat: 4.98 mg/dL — ABNORMAL HIGH (ref 0.50–1.10)
Total Bilirubin: 0.3 mg/dL (ref 0.3–1.2)

## 2013-05-20 ENCOUNTER — Encounter: Payer: Self-pay | Admitting: Family Medicine

## 2013-05-22 ENCOUNTER — Telehealth: Payer: Self-pay | Admitting: Family Medicine

## 2013-05-22 MED ORDER — ALUM HYDROXIDE-MAG CARBONATE 95-358 MG/15ML PO SUSP: 15.0000 mL | Freq: Three times a day (TID) | ORAL | Status: DC

## 2013-05-22 NOTE — Telephone Encounter (Signed)
Patient has questions about medicines Dr. Sheffield Slider has changed. Would like to speak to Dr. Sheffield Slider or a nurse.

## 2013-05-22 NOTE — Telephone Encounter (Signed)
Gaviscon added on consultation visit with Dr Madilyn Fireman. Report scanned.

## 2013-05-22 NOTE — Telephone Encounter (Signed)
Pt reports that she has questions about meds. Pt would like to know if she can take her Vitamin D pills that she already has at home instead of getting Calcitrol?Is there anything that she can do to decrease her calcium levels? Also reports that her acid reflux is becoming worse and she can't remember what you suggested. Please advise.  Wyatt Haste, RN-BSN

## 2013-05-23 NOTE — Telephone Encounter (Signed)
Pt is concerned that Dr Sheffield Slider has not returned her call.  She says her acid reflux is so bad she cant eat. She doesn't know what to eat. Please advise

## 2013-05-26 ENCOUNTER — Telehealth: Payer: Self-pay | Admitting: Family Medicine

## 2013-05-26 NOTE — Telephone Encounter (Signed)
I discussed with her the heartburn symptoms. She will try Gaviscon liquid for symptomatic relief. We'll review her response. I discussed that it can't be used long term due to aluminum accumulation in renal failure. The chiropractor said he saw what could be a tumor in her throat.   She also says she is losing hearing.

## 2013-05-26 NOTE — Telephone Encounter (Signed)
Patient said that she has had issues with her phone, but to please try her on cell this afternoon.

## 2013-05-26 NOTE — Telephone Encounter (Signed)
I got no answer on her home or cell phone, as was the case when I called last week. I'll try again later.

## 2013-05-26 NOTE — Telephone Encounter (Signed)
Please see note below and call Ms. Hettinger if possible. Thanks. Wyatt Haste, RN-BSN

## 2013-05-27 NOTE — Telephone Encounter (Signed)
I called Belinda [name] yesterday and recommended that she take Gaviscon

## 2013-05-29 ENCOUNTER — Encounter: Payer: Self-pay | Admitting: Home Health Services

## 2013-05-29 NOTE — Progress Notes (Signed)
May 27, 2013  Name: Belinda [name]  DOB: [birth year]  MRN: [id number]  Patient declined services: Yes No  Primary Presenting Issue: patient was referred to [facility] for community case management due to high risk indicators.  Interventions: initial telephone contact made. patient asked, "what is this about!" this RNCM made an attempt to explain purpose of call, acknowledge referral source (Dr. Sheffield Slider), however, this RNCM heard a click and the phone line went dead. unsuccessful attempt made to reconnect the call.  Has issue been resolved: Yes No  Secondary Presenting Issue:  Interventions:  Has issue been resolved: Yes No  Additional Comments: update sent to [facility] to advise Dr. Sheffield Slider.  I will meet patient at [facility] during her next office visit.

## 2013-06-10 ENCOUNTER — Ambulatory Visit: Payer: Medicare Other | Admitting: Family Medicine

## 2013-06-12 ENCOUNTER — Other Ambulatory Visit: Payer: Self-pay

## 2013-06-17 ENCOUNTER — Ambulatory Visit (INDEPENDENT_AMBULATORY_CARE_PROVIDER_SITE_OTHER): Payer: Medicare Other | Admitting: Family Medicine

## 2013-06-17 ENCOUNTER — Encounter: Payer: Self-pay | Admitting: Family Medicine

## 2013-06-17 VITALS — BP 150/70 | HR 76 | Ht 59.0 in | Wt 133.0 lb

## 2013-06-17 DIAGNOSIS — F411 Generalized anxiety disorder: Secondary | ICD-10-CM

## 2013-06-17 DIAGNOSIS — M19019 Primary osteoarthritis, unspecified shoulder: Secondary | ICD-10-CM

## 2013-06-17 DIAGNOSIS — M48061 Spinal stenosis, lumbar region without neurogenic claudication: Secondary | ICD-10-CM

## 2013-06-17 DIAGNOSIS — I10 Essential (primary) hypertension: Secondary | ICD-10-CM

## 2013-06-17 DIAGNOSIS — R269 Unspecified abnormalities of gait and mobility: Secondary | ICD-10-CM

## 2013-06-17 DIAGNOSIS — I2510 Atherosclerotic heart disease of native coronary artery without angina pectoris: Secondary | ICD-10-CM

## 2013-06-17 DIAGNOSIS — N185 Chronic kidney disease, stage 5: Secondary | ICD-10-CM

## 2013-06-17 MED ORDER — OXYCODONE HCL 5 MG PO TABS: 2.5000 mg | ORAL_TABLET | ORAL | Status: DC | PRN

## 2013-06-17 MED ORDER — BUSPIRONE HCL 15 MG PO TABS: 7.5000 mg | ORAL_TABLET | Freq: Two times a day (BID) | ORAL | Status: DC

## 2013-06-17 MED ORDER — SIMVASTATIN 20 MG PO TABS: ORAL_TABLET | ORAL | Status: DC

## 2013-06-17 NOTE — Assessment & Plan Note (Signed)
Needs more pain relief. Will switch to Oxycodone in hopes there is less nausea and to allow separate Acetaminophen dosing.

## 2013-06-17 NOTE — Assessment & Plan Note (Signed)
Buspirone dose decreased due to renal failure

## 2013-06-17 NOTE — Progress Notes (Signed)
  Subjective:    Patient ID: Belinda [name], female    DOB: 27-Oct-1927, 79 y.o.   MRN: [id number]  HPI left shoulder pain - worse again. Would like another injection. The pain makes it hard to sleep.  Bilateral knee pain - left greater than right. Makes it hard to walk. She is planning to see Dr Farris Has for a possible injection.   Renal failure - Still declines to consider dialysis. Has an appointment with Dr Scotty Court her new renal doctor Aug 16th. She's going to call them about getting blood drawn before that visit.   Back pain - Says it's in her left hip, but indicates her left sciatic area and her wound hernia from her remote left nephrectomy. Makes it hard to sleep. Can only take 1/2 of a hydrocodone APAP pill because the full tab makes her nauseated.    Review of Systems     Objective:   Physical Exam  Constitutional:  Chronically ill appearing  Pale  Cardiovascular: Normal rate and regular rhythm.   Murmur heard. Grade 1/6 SEM  Pulmonary/Chest: Effort normal and breath sounds normal. She has no rales.  Abdominal: Soft. There is no guarding.  Large bulge left low back around her nephrectomy scar. Tender diffusely there  Musculoskeletal: She exhibits tenderness. She exhibits no edema.  Very tender over her left sciatic notch and her left AC joint Can't abduct her left shoulder past 90 degrees, but neg impingement and drop tests. Pain with left arm reach across chest.   Unable to stand up straight with low back scoliosis  Neurological: She is alert.  Skin: Skin is warm and dry.  Psychiatric:  Appears depressed and in chronic pain          Assessment & Plan:

## 2013-06-17 NOTE — Assessment & Plan Note (Signed)
Flaring again. Will refer to Dr Farris Has for an injection

## 2013-06-17 NOTE — Assessment & Plan Note (Signed)
Becoming frailer. High fall risk

## 2013-06-17 NOTE — Patient Instructions (Addendum)
Please return to see Dr Sheffield Slider in 1 month  Call if any problems with pain medication  We will schedule with Dr Farris Has as soon as he's available

## 2013-06-17 NOTE — Assessment & Plan Note (Signed)
Renal follow up in August. Increasing metabolic abnormalities and medication accumulation likely. Will continue working on decreasing doses

## 2013-06-17 NOTE — Assessment & Plan Note (Signed)
No recent chest pain  Simvastatin dose decreased due to renal failure

## 2013-06-17 NOTE — Assessment & Plan Note (Signed)
well controlled  

## 2013-06-18 ENCOUNTER — Telehealth: Payer: Self-pay | Admitting: Family Medicine

## 2013-06-18 NOTE — Telephone Encounter (Signed)
Son wants to make sure that mom is set up to see Dr. Farris Has for an injection in her shoulder and knees.

## 2013-06-19 NOTE — Addendum Note (Signed)
Addended by: Zachery Dauer on: 06/19/2013 12:30 PM   Modules accepted: Orders

## 2013-06-19 NOTE — Telephone Encounter (Signed)
Appointment scheduled with Dr. Farris Has [facility]) 06/23/2013 @ 3:15pm.  Left message at [phone] to return my call so I could give her the appointment information.  Ileana Ladd

## 2013-06-19 NOTE — Telephone Encounter (Signed)
I put in the referral, I had to remember to make it an external referral.

## 2013-06-19 NOTE — Telephone Encounter (Signed)
Will fwd to MD for advice on referral.  Radene Ou, CMA

## 2013-07-01 ENCOUNTER — Encounter: Payer: Self-pay | Admitting: Family Medicine

## 2013-07-01 ENCOUNTER — Ambulatory Visit (INDEPENDENT_AMBULATORY_CARE_PROVIDER_SITE_OTHER): Payer: Medicare Other | Admitting: Family Medicine

## 2013-07-01 ENCOUNTER — Telehealth: Payer: Self-pay | Admitting: Family Medicine

## 2013-07-01 VITALS — BP 150/73 | HR 76 | Ht 59.0 in | Wt 128.0 lb

## 2013-07-01 DIAGNOSIS — W19XXXA Unspecified fall, initial encounter: Secondary | ICD-10-CM

## 2013-07-01 DIAGNOSIS — M7918 Myalgia, other site: Secondary | ICD-10-CM | POA: Insufficient documentation

## 2013-07-01 DIAGNOSIS — IMO0001 Reserved for inherently not codable concepts without codable children: Secondary | ICD-10-CM

## 2013-07-01 NOTE — Telephone Encounter (Signed)
Patient is calling because she fell in the parking lot at [facility] yesterday.  She was getting into her car and her legs let out and she went down on her knees.  There were 4 - 5 people who worked at a Nursing Home who helped her up and into her car and called for an ambulance.  They worked her up and offered to take her to the hospital to check her out but she refused to go.  She said, "There wasn't nothing they could no for me no ways, I was just goin to be sore.  An boy am I.  My hips and my boob sure are sore.  I just don't know if I should take my medicine."  She wants to speak to the triage nurse, but she doesn't want the triage nurse to tell her that she should go to the ER to be checked out.

## 2013-07-01 NOTE — Telephone Encounter (Signed)
Returned call to pt home number - no answer.  Call to pt mobile phone . Pt reports that she is sore all over and bruised up. States that she felt faint and her feet went out form under her and she doesn't know what made her feel that way - "I had just eaten and I just felt faint and my knees went out from under me. I used the bathroom and got sick when I came home and felt faint again. I haven't had a spell in a long time" Pt encouraged to go to ED for further eval of the cause of these "spells" - refused. Pt did agree to come in at 330 today for office eval. - encouraged to drink lots of fluid, eat, and take meds as normal. Pt verbalized understanding and will come at 330 today. Wyatt Haste, RN-BSN

## 2013-07-01 NOTE — Assessment & Plan Note (Addendum)
Likely secondary to orthostasis vs vasovagal from dehydration from diarrheal episode from viral gastro. Near orthostatic on VS w/ SBP drop of Imodium Pt has pedialyte at home.  Rest.

## 2013-07-01 NOTE — Progress Notes (Signed)
Belinda [name] is a 79 y.o. female who presents to [facility] today for SD appt for fainting spell  Occurred yesterday. Stomach hurting at time of episode. Went from sitting to walking across the parking lot to the car. Fell to knees and felt week. reoprts going black but did not hit the ground. No head trauma. Later that day copious diarrhea and near syncopal episodes. 2 days prior to episode has also had diarrheal episode. Also reports episodes of passing out on toilet from time to time while straining.   Fainting episodes multiple times in the past. W/ GI illnesses.   Also complaining of sore "bottom". Present for several days primarily around the sacral area. Told she has an open sore there  The following portions of the patient's history were reviewed and updated as appropriate: allergies, current medications, past medical history, family and social history, and problem list.  Patient is a nonsmoker.  Past Medical History  Diagnosis Date  . Stenosis, spinal, lumbar 05/20/2006    L 3-4, 4-5  MRI  . Bilateral sensorineural hearing loss 10/2006    audiogram  . Diabetes mellitus   . Chronic kidney disease   . Diverticulosis of colon   . H/O endoscopy 11/2006    benign duodenal scar  . Normal cardiac stress test 01/30/2006    Cardiolite EF 54%  . Abnormal barium swallow 12/2009    ?thyroid nodule    ROS as above otherwise neg.    Medications reviewed. Current Outpatient Prescriptions  Medication Sig Dispense Refill  . acetaminophen (TYLENOL) 650 MG CR tablet Take 650 mg by mouth every 8 (eight) hours as needed for pain.      Marland Kitchen acetic acid-hydrocortisone (VOSOL-HC) otic solution Place 3 drops into both ears 2 (two) times daily.  10 mL  2  . allopurinol (ZYLOPRIM) 100 MG tablet take 1 tablet by mouth once daily  90 tablet  3  . aluminum hydroxide-magnesium carbonate (GAVISCON) 95-358 MG/15ML SUSP Take 15 mLs by mouth 4 (four) times daily - after meals and at bedtime.    0  . busPIRone (BUSPAR)  15 MG tablet Take 0.5 tablets (7.5 mg total) by mouth 2 (two) times daily.  30 tablet  5  . calcitRIOL (ROCALTROL) 0.5 MCG capsule Take 1 capsule (0.5 mcg total) by mouth 3 (three) times a week. 3 times weekly  36 capsule  3  . Cetirizine HCl 10 MG CAPS Take 1 capsule (10 mg total) by mouth at bedtime. OTC  90 capsule  3  . clopidogrel (PLAVIX) 75 MG tablet Take 1 tablet (75 mg total) by mouth daily.  30 tablet  5  . famotidine (PEPCID) 10 MG tablet Take 10 mg by mouth at bedtime as needed for heartburn.      . fluticasone (FLONASE) 50 MCG/ACT nasal spray instill 2 sprays into each nostril every morning  16 g  11  . furosemide (LASIX) 20 MG tablet Take 1 tablet (20 mg total) by mouth daily.  90 tablet  3  . glucose blood (BAYER CONTOUR TEST) test strip Test fasting and when symptoms of low blood sugar  100 each  11  . HYDROcodone-acetaminophen (NORCO/VICODIN) 5-325 MG per tablet Take 1 tablet by mouth every 4 (four) hours as needed for pain.  30 tablet  2  . isosorbide mononitrate (IMDUR) 30 MG 24 hr tablet take 1 tablet by mouth every morning  30 tablet  5  . lidocaine (LIDODERM) 5 % Place 1 patch onto the  skin daily. Remove & Discard patch within 12 hours or as directed by MD  30 patch  2  . loperamide (IMODIUM) 2 MG capsule Take 2 mg by mouth 4 (four) times daily as needed.        . metoprolol succinate (TOPROL-XL) 50 MG 24 hr tablet Take 1/2 daily  30 tablet  11  . mupirocin ointment (BACTROBAN) 2 % Apply topically 2 (two) times daily.  22 g  0  . NIFEdipine (PROCARDIA-XL/ADALAT CC) 60 MG 24 hr tablet Take 1 tablet (60 mg total) by mouth daily.  90 tablet  3  . NITROSTAT 0.4 MG SL tablet PLACE 1 TABLET UNDER THE TONGUE AS NEEDED FOR THE CHEST PAIN, MAY REPEAT IN 5 MINUTES 2 TIMES  BUT IF STILL HAVING PROBLEMS ACCESS EMERGENCY  25 tablet  4  . oxyCODONE (ROXICODONE) 5 MG immediate release tablet Take 0.5 tablets (2.5 mg total) by mouth every 4 (four) hours as needed for pain.  30 tablet  0  .  pantoprazole (PROTONIX) 40 MG tablet take 1 tablet by mouth once daily  90 tablet  3  . polyethylene glycol (MIRALAX / GLYCOLAX) packet Take 17 g by mouth daily. Adjust dose to have soft but not loose bowel movements  14 each  11  . PROAIR HFA 108 (90 BASE) MCG/ACT inhaler inhale 2 puffs by mouth four times a day if needed  18 g  1  . Pyridoxine HCl (VITAMIN B-6) 100 MG tablet Take 100 mg by mouth daily.       . simvastatin (ZOCOR) 20 MG tablet take 1 tablet by mouth once daily  90 tablet  3   No current facility-administered medications for this visit.    Exam: BP 159/74  Pulse 73  Ht 4\' 11"  (1.499 m)  Wt 128 lb (58.06 kg)  BMI 25.84 kg/m2 Gen: Well NAD HEENT: EOMI,  MMM GI: hyperactive BS.  MSK/Skin: No evidence of decubitus ulceration. Skin completely intact. Soft tissue ttp near sacral region. Hair follicle w/ mild erythema, but w/o purulent discharge  No results found for this or any previous visit (from the past 72 hour(s)).   Spent >25 min in direct patient care

## 2013-07-01 NOTE — Assessment & Plan Note (Signed)
No skin ulceration or breakdown requiring treatment of bottom as pt was told by caregiver. Soft tissue tenderness likely from fall.  Recommended soft gel or foam cushion to sit on

## 2013-07-01 NOTE — Patient Instructions (Addendum)
Your fall episodes are from having a drop in your blood pressure from being dehydrated from the diarrhea.  Please stay well hydrated and drink Pedialyte. Please start taking imodium for the diarrhea. The diarrhea is from a viral gut infection.  Use a soft gel cushion to sit on.  There are no sores on your bottom that need treatment.  Diarrhea Diarrhea is frequent loose and watery bowel movements. It can cause you to feel weak and dehydrated. Dehydration can cause you to become tired and thirsty, have a dry mouth, and have decreased urination that often is dark yellow. Diarrhea is a sign of another problem, most often an infection that will not last long. In most cases, diarrhea typically lasts 2 3 days. However, it can last longer if it is a sign of something more serious. It is important to treat your diarrhea as directed by your caregive to lessen or prevent future episodes of diarrhea. CAUSES  Some common causes include:  Gastrointestinal infections caused by viruses, bacteria, or parasites.  Food poisoning or food allergies.  Certain medicines, such as antibiotics, chemotherapy, and laxatives.  Artificial sweeteners and fructose.  Digestive disorders. HOME CARE INSTRUCTIONS  Ensure adequate fluid intake (hydration): have 1 cup (8 oz) of fluid for each diarrhea episode. Avoid fluids that contain simple sugars or sports drinks, fruit juices, whole milk products, and sodas. Your urine should be clear or pale yellow if you are drinking enough fluids. Hydrate with an oral rehydration solution that you can purchase at pharmacies, retail stores, and online. You can prepare an oral rehydration solution at home by mixing the following ingredients together:    tsp table salt.   tsp baking soda.   tsp salt substitute containing potassium chloride.  1  tablespoons sugar.  1 L (34 oz) of water.  Certain foods and beverages may increase the speed at which food moves through the  gastrointestinal (GI) tract. These foods and beverages should be avoided and include:  Caffeinated and alcoholic beverages.  High-fiber foods, such as raw fruits and vegetables, nuts, seeds, and whole grain breads and cereals.  Foods and beverages sweetened with sugar alcohols, such as xylitol, sorbitol, and mannitol.  Some foods may be well tolerated and may help thicken stool including:  Starchy foods, such as rice, toast, pasta, low-sugar cereal, oatmeal, grits, baked potatoes, crackers, and bagels.  Bananas.  Applesauce.  Add probiotic-rich foods to help increase healthy bacteria in the GI tract, such as yogurt and fermented milk products.  Wash your hands well after each diarrhea episode.  Only take over-the-counter or prescription medicines as directed by your caregiver.  Take a warm bath to relieve any burning or pain from frequent diarrhea episodes. SEEK IMMEDIATE MEDICAL CARE IF:   You are unable to keep fluids down.  You have persistent vomiting.  You have blood in your stool, or your stools are black and tarry.  You do not urinate in 6 8 hours, or there is only a small amount of very dark urine.  You have abdominal pain that increases or localizes.  You have weakness, dizziness, confusion, or lightheadedness.  You have a severe headache.  Your diarrhea gets worse or does not get better.  You have a fever or persistent symptoms for more than 2 3 days.  You have a fever and your symptoms suddenly get worse. MAKE SURE YOU:   Understand these instructions.  Will watch your condition.  Will get help right away if you are not doing  well or get worse. Document Released: 11/10/2002 Document Revised: 11/06/2012 Document Reviewed: 07/28/2012 [facility] Patient Information 2014 [place], [place].

## 2013-07-07 ENCOUNTER — Telehealth: Payer: Self-pay | Admitting: Family Medicine

## 2013-07-07 NOTE — Telephone Encounter (Signed)
Called [name] to call us back. Please ask for details. (Dr.Hale is not in clinic and is working in the Hospital) .Belinda [name]

## 2013-07-07 NOTE — Telephone Encounter (Signed)
Pt is wanting Dr. Sheffield Slider to give her call concerning her visit last week. She feel and seen Dr. Konrad Dolores  And is still having issues from her fall. Belinda [name]

## 2013-07-22 ENCOUNTER — Ambulatory Visit (INDEPENDENT_AMBULATORY_CARE_PROVIDER_SITE_OTHER): Payer: Medicare Other | Admitting: Family Medicine

## 2013-07-22 ENCOUNTER — Encounter: Payer: Self-pay | Admitting: Family Medicine

## 2013-07-22 VITALS — BP 166/71 | HR 87 | Ht 59.0 in | Wt 132.0 lb

## 2013-07-22 DIAGNOSIS — E1169 Type 2 diabetes mellitus with other specified complication: Secondary | ICD-10-CM

## 2013-07-22 DIAGNOSIS — K639 Disease of intestine, unspecified: Secondary | ICD-10-CM

## 2013-07-22 DIAGNOSIS — F411 Generalized anxiety disorder: Secondary | ICD-10-CM

## 2013-07-22 DIAGNOSIS — I10 Essential (primary) hypertension: Secondary | ICD-10-CM

## 2013-07-22 MED ORDER — METOPROLOL SUCCINATE ER 50 MG PO TB24: 50.0000 mg | ORAL_TABLET | Freq: Every day | ORAL | Status: DC

## 2013-07-22 MED ORDER — BUSPIRONE HCL 15 MG PO TABS: 7.5000 mg | ORAL_TABLET | Freq: Two times a day (BID) | ORAL | Status: DC

## 2013-07-22 NOTE — Assessment & Plan Note (Signed)
Increased without increase in edema or weight.  Will put her on daily Metoprolol 50 mg and see how she tolerates it.

## 2013-07-22 NOTE — Assessment & Plan Note (Signed)
Recently tending to constipation

## 2013-07-22 NOTE — Progress Notes (Signed)
  Subjective:    Patient ID: Belinda [name], female    DOB: [birth year], 79 y.o.   MRN: [id number]  Knee Pain    Hypercalcemia - Dr Verna Czech her new nephrologist stopped the Calcitriol because of this. Her creatinine was improved to 4.35  Anxiety - she lost her Buspirone and requests a new Rx.   Larey Seat one month ago  Rash on buttocks - treated at Dr Dorita Sciara office with 3 weeks of Cephalexin which she is still taking. They had also  Prescribed a topical steroid, Mometasone which caused burning so she stopped it Review of Systems     Objective:   Physical Exam  Cardiovascular: Normal rate and regular rhythm.   No murmur heard. Pulmonary/Chest: Effort normal and breath sounds normal.  Neurological: She is alert.  Skin:  A 8 mm healing area of erythema on each buttock only   Psychiatric:  Anxious appearing as usual          Assessment & Plan:

## 2013-07-22 NOTE — Patient Instructions (Addendum)
Increase the Metoprolol 50 mg a whole tablet daily  Please return to see Dr Randolm Idol in 1 month  I will be retiring in the summer of this year. Thank you for allowing me to work with you in maintaining your health. Donnella Sham, MD will be my replacement beginning the second week in August. He is moving to [place] having completed his family medicine residency training in [place], [place]. Please let me know if you would like to be assigned to a different physician.

## 2013-07-22 NOTE — Assessment & Plan Note (Signed)
Low dose Buspirone restarted

## 2013-07-28 ENCOUNTER — Encounter: Payer: Self-pay | Admitting: Home Health Services

## 2013-07-28 NOTE — Progress Notes (Signed)
Name: Belinda [name], DOB  DOB: [birth year]  MRN: [id number]  Patient declined services: Yes No  Primary Presenting Issue: High risk assessment referral made to [facility] for Community Case Management.  Interventions: 3 unsuccessful attempts made to contact patient. On May 27, 2013 patient did answer the phone, however, call was abruptly ended when patient hung up the phone. Patient did not answer phone when return call made.  Update sent to [facility] to notify them of intent to discharge. [facility] would be more than happy to attempt engagement in the future if patient agrees.  Has issue been resolved: Yes No  .

## 2013-07-31 ENCOUNTER — Ambulatory Visit (INDEPENDENT_AMBULATORY_CARE_PROVIDER_SITE_OTHER): Payer: Medicare Other | Admitting: Family Medicine

## 2013-07-31 ENCOUNTER — Telehealth: Payer: Self-pay | Admitting: *Deleted

## 2013-07-31 ENCOUNTER — Encounter: Payer: Self-pay | Admitting: Family Medicine

## 2013-07-31 VITALS — BP 175/69 | HR 73 | Ht 59.0 in | Wt 132.0 lb

## 2013-07-31 DIAGNOSIS — K529 Noninfective gastroenteritis and colitis, unspecified: Secondary | ICD-10-CM

## 2013-07-31 DIAGNOSIS — K5289 Other specified noninfective gastroenteritis and colitis: Secondary | ICD-10-CM

## 2013-07-31 DIAGNOSIS — N185 Chronic kidney disease, stage 5: Secondary | ICD-10-CM

## 2013-07-31 DIAGNOSIS — R195 Other fecal abnormalities: Secondary | ICD-10-CM

## 2013-07-31 LAB — CBC WITH DIFFERENTIAL/PLATELET
Basophils Relative: 0 % (ref 0–1)
HCT: 27.9 % — ABNORMAL LOW (ref 36.0–46.0)
Hemoglobin: 9.2 g/dL — ABNORMAL LOW (ref 12.0–15.0)
Lymphs Abs: 1.6 10*3/uL (ref 0.7–4.0)
MCH: 31.7 pg (ref 26.0–34.0)
MCHC: 33 g/dL (ref 30.0–36.0)
Monocytes Absolute: 0.6 10*3/uL (ref 0.1–1.0)
Monocytes Relative: 8 % (ref 3–12)
Neutro Abs: 5.3 10*3/uL (ref 1.7–7.7)
RBC: 2.9 MIL/uL — ABNORMAL LOW (ref 3.87–5.11)

## 2013-07-31 NOTE — Assessment & Plan Note (Addendum)
Likely due to CKD Continue stool softeners Will add H2 blocker, Zantac renally dosed 75 mg daily Advised not take gavascon and zantac at the same time Likely causing FOBT + stool  Will check CBC to rule out major bleed and subsequent acute blood loss anemia

## 2013-07-31 NOTE — Assessment & Plan Note (Signed)
Likely causing enteritis after discussion with Dr. Sheffield Slider

## 2013-07-31 NOTE — Telephone Encounter (Signed)
Pt reports that she is burning and hurting  Because she has not been on her protonix - constipated and been taking stool softeners, results today were black stools. Informed pt that she needed to go to ED for immediate further eval. - pt tearful " I don't wanna go, I just cant" Consulted with Dr. Sheffield Slider -would like for pt to come in this afternoon - appointment made with Dr. Ermalinda Memos - pt called and informed. Wyatt Haste, RN-BSN

## 2013-07-31 NOTE — Assessment & Plan Note (Addendum)
Likely due to enteritis, will follow  Not overtly melena Check CBC Reviewed warning signs of GI bleed and advised seeking ED care as appropriate.

## 2013-07-31 NOTE — Progress Notes (Signed)
  Subjective:    Patient ID: Belinda [name], female    DOB: 05/11/1927, 79 y.o.   MRN: [id number]  HPI Pt here for  Same day appt for dark stool and worsening heartburn  States that she has been constipated for 2 weeks without a bm for approx 1-2 weeks. Notes decreased appetite and that nothing sounds good.  Started using glycerin suppositories yesterday, today after the 2nd one had a single BM that was very dark.  taking 100 mg tab of stool softener BID  She denies hematochezia and overt melena + flatus  GERD Has had worsening GERD symtpoms since stopping PPI, taking gavascon QID without much improvement, notes frequent belching  Review of Systems Per HPI    Objective:   Physical Exam  Gen: NAD, alert, cooperative with exam HEENT: NCATL CV: RRR, good S1/S2, no murmur Resp: CTABL, no wheezes, non-labored Abd: SNTND, BS present, no guarding or organomegaly Ext: No edema, warm Neuro: Alert and oriented, No gross deficits Rectal: minimal st9ool with brown appearance, decreased rectal tone  FOBT +       Assessment & Plan:

## 2013-07-31 NOTE — Patient Instructions (Addendum)
It was great to meet you today!  Try zantac, 75 mg once a day for your heartburn symptoms  I will call you if your blood counts are severely low, I'll send a letter if they are normal

## 2013-08-01 ENCOUNTER — Encounter: Payer: Self-pay | Admitting: Family Medicine

## 2013-08-01 LAB — BASIC METABOLIC PANEL
BUN: 52 mg/dL — ABNORMAL HIGH (ref 6–23)
CO2: 22 mEq/L (ref 19–32)
Glucose, Bld: 134 mg/dL — ABNORMAL HIGH (ref 70–99)
Potassium: 5.1 mEq/L (ref 3.5–5.3)

## 2013-08-07 ENCOUNTER — Telehealth: Payer: Self-pay | Admitting: Family Medicine

## 2013-08-07 NOTE — Telephone Encounter (Signed)
Pt states she has taken 3 bottles of gaviscon and just now noticed a warning if you have kidney problems-states she has 1 kidney-and now she hasnt urinated in 4 hr and is concerned. States she takes zantac every day also but never together.

## 2013-08-07 NOTE — Telephone Encounter (Signed)
Pt needs some advice on the medication that is OTC and was told by Dr. Sheffield Slider to take it. She said the bottle states that she should take it if she has kidney problems, she said that she only had one kidney.She said now she has problems going to the bathroom and she unsure what she should do. JW

## 2013-08-08 NOTE — Telephone Encounter (Signed)
Discussed taking Gaviscon in addition to zantac BID for GERD, patient has history of single kidney with CKD, told her that it is ok to take once daily if she has symptoms however she should not take it multiple times per day, patient expressed understanding. She also complains of hot flashes that occur daily. Told her that we will discuss this at her next visit.

## 2013-08-25 ENCOUNTER — Encounter: Payer: Self-pay | Admitting: Family Medicine

## 2013-08-25 ENCOUNTER — Ambulatory Visit (INDEPENDENT_AMBULATORY_CARE_PROVIDER_SITE_OTHER): Payer: Medicare Other | Admitting: Family Medicine

## 2013-08-25 VITALS — BP 178/80 | HR 76 | Temp 99.1°F | Ht 59.0 in | Wt 130.0 lb

## 2013-08-25 DIAGNOSIS — R197 Diarrhea, unspecified: Secondary | ICD-10-CM

## 2013-08-25 DIAGNOSIS — I10 Essential (primary) hypertension: Secondary | ICD-10-CM

## 2013-08-25 DIAGNOSIS — Z23 Encounter for immunization: Secondary | ICD-10-CM

## 2013-08-25 NOTE — Assessment & Plan Note (Signed)
Two-day history of diarrhea secondary to IBS versus acute viral gastroenteritis. Patient is currently asymptomatic without evidence of acute abdomen. -Encouraged by mouth intake including Pedialyte for rehydration -Patient encouraged to hold MiraLax and Imodium at this time

## 2013-08-25 NOTE — Progress Notes (Signed)
  Subjective:    Patient ID: Belinda [name], female    DOB: [birth year], 79 y.o.   MRN: [id number]  HPI 79 year old female with past medical history of IBS presents with two-day history of diarrhea, patient states she awoke 2 days ago and had multiple stools throughout the day, this is associated with decreased by mouth intake, she denies nausea or vomiting, she denies gross blood in her stool, she does have a history of blood in her stool and has previously undergone evaluation, patient states she took Imodium yesterday and her symptoms have since resolved, she denies bowel movements today, no nausea, no vomiting, no, pain, no subjective fevers, she did note some chills over the weekend, denies sore throat, does have runny nose, no other URI take symptoms appreciated today, patient states that she has not taken her blood pressure pills for the past 2 days due to diarrhea and decreased by mouth intake, denies headaches, denies chest pain, no lightheadedness   Review of Systems  Constitutional: Positive for fatigue. Negative for fever and chills.  Respiratory: Negative for apnea and cough.   Cardiovascular: Negative for chest pain.  Gastrointestinal: Positive for diarrhea. Negative for nausea, vomiting and blood in stool.       Objective:   Physical Exam Vitals: Reviewed, blood pressure elevated General: Pleasant, Caucasian female, no acute distress, accompanied by son HEENT: Pupils are round reactive light, extra amounts are intact, dry mucous members, uvula midline, no pharyngeal erythema or exudate noted, neck was supple, no adenopathy Cardiac: Regular rate and rhythm, S1-S2 present, no murmurs, no heaves or thrills Respiratory: Clear to patient bilaterally, no wheezes, normal effort Abdomen: Soft, bowel sounds present in all quadrants, nontender, scars present from previous hysterectomy Extremities: No edema       Assessment & Plan:  Please see problem specific assessment and plan.

## 2013-08-25 NOTE — Patient Instructions (Addendum)
Diarrhea - stop immodium, purchase Pedialyte on the way home, eat a bland diet for the next few days  Make an appointment in 1-2 weeks to follow up of high blood pressure/hot flashes.

## 2013-08-25 NOTE — Assessment & Plan Note (Signed)
Patient's blood pressure is currently elevated as she is unable to take her blood pressure medications for the past 2 days due to diarrhea. -Patient is resume blood pressure medications -Patient counseled to return to office in one to 2 weeks for followup of blood pressure

## 2013-08-29 ENCOUNTER — Encounter: Payer: Self-pay | Admitting: Family Medicine

## 2013-08-29 ENCOUNTER — Ambulatory Visit (INDEPENDENT_AMBULATORY_CARE_PROVIDER_SITE_OTHER): Payer: Medicare Other | Admitting: Family Medicine

## 2013-08-29 VITALS — BP 155/70 | HR 66 | Temp 98.7°F | Ht 59.0 in | Wt 129.4 lb

## 2013-08-29 DIAGNOSIS — J309 Allergic rhinitis, unspecified: Secondary | ICD-10-CM

## 2013-08-29 DIAGNOSIS — K219 Gastro-esophageal reflux disease without esophagitis: Secondary | ICD-10-CM

## 2013-08-29 NOTE — Patient Instructions (Addendum)
I think that your symptoms are related to your acid reflux. I recommend that you take the Zantac twice daily. You may use tums when you have severe symptoms.   Your may also have a viral illness that is contributing to your sore throat. Continue halls and salt water gargles. I recommend that you use Flonase and Zyrtec daily and not only as needed.   Call if you continue to have symptoms.

## 2013-08-29 NOTE — Assessment & Plan Note (Signed)
79 year old female presents with two-day history of sore throat which appears to be secondary to acid reflux. She was counseled to increase Zantac to send him milligrams by mouth twice a day.

## 2013-08-29 NOTE — Progress Notes (Signed)
  Subjective:    Patient ID: Belinda [name], female    DOB: 11-Dec-1926, 79 y.o.   MRN: [id number]  HPI 79 year old female with past medical history of acid reflux presents for evaluation of epigastric burning sensation and sore throat. Her symptoms are present for the past 2 days. The patient reports a history of burning sensation as well as belching. She has been taking Zantac 75 mg once daily which has provided minimal relief, she also has been taking TUMS intermittently which provided some relief of her pain, the patient also has been using Halls cough drops which provided minimal relief, her symptoms are worse at night, she describes some difficulty with swallowing foods and liquids, no emesis, no nausea, no abdominal pain other than noted above, she was recently seen and evaluated in the office for diarrhea which has since resolved. The patient also has a history of allergic rhinitis and is currently on Zyrtec and Flonase. She does not take these medications on a regular basis. She does report a history of rhinorrhea as well as postnasal drip. She otherwise denies ear pressure, your tenderness, runny eyes, sneezing, cough.   Review of Systems  Constitutional: Negative for chills and fatigue.  HENT: Positive for congestion, sore throat, rhinorrhea and postnasal drip. Negative for ear discharge.   Respiratory: Negative for cough.   Cardiovascular: Negative for chest pain.  Gastrointestinal: Negative for vomiting and diarrhea.       Objective:   Physical Exam Vitals: Reviewed General: Pleasant Caucasian female, accompanied by son, no acute distress HEENT: Normocephalic, bilateral TMs are pearly-gray without erythema or bulging, pupils are equal round and reactive to light, extraocular movements are intact, no scleral icterus, no eye drainage, nasal septum midline, nasal turbinates were mildly boggy, Lasix membranes, neck was supple, uvula midline, no pharyngeal erythema or or exudate noted, no  anterior posterior cervical lymphadenopathy Cardiac: Regular rate and rhythm, S1 and S2 present, no murmurs, no heaves or thrills Respiratory: Clear to auscultation bilaterally, normal effort Abdomen: mild epigastric abdominal tenderness, bowel sounds present, no rebound or guarding       Assessment & Plan:  Please see problem specific assessment and plan.

## 2013-08-29 NOTE — Assessment & Plan Note (Signed)
Patient has been noncompliant with regular use of Flonase and Zyrtec. She was counseled on proper use of these medications. She also may have overlying viral illness which may be contributing to current symptoms of sore throat. She was counseled on conservative measures including the use of Halls cough drops and salt water gargles in addition to Flonase and Zyrtec.

## 2013-09-05 ENCOUNTER — Encounter: Payer: Self-pay | Admitting: Family Medicine

## 2013-09-05 ENCOUNTER — Ambulatory Visit (INDEPENDENT_AMBULATORY_CARE_PROVIDER_SITE_OTHER): Payer: Medicare Other | Admitting: Family Medicine

## 2013-09-05 VITALS — BP 152/62 | HR 71 | Temp 97.9°F | Ht 59.0 in | Wt 131.9 lb

## 2013-09-05 DIAGNOSIS — R05 Cough: Secondary | ICD-10-CM

## 2013-09-05 DIAGNOSIS — I10 Essential (primary) hypertension: Secondary | ICD-10-CM

## 2013-09-05 MED ORDER — METOPROLOL SUCCINATE ER 100 MG PO TB24: ORAL_TABLET | ORAL | Status: DC

## 2013-09-05 MED ORDER — METOPROLOL SUCCINATE ER 50 MG PO TB24: ORAL_TABLET | ORAL | Status: DC

## 2013-09-05 NOTE — Assessment & Plan Note (Signed)
Patient is not at goal of 140/90 per JNC 8 guidelines given her history of end-stage renal disease. -Patient is to increase her Toprol 100 mg daily and is to continue her current dose of Procardia -Patient does have a history of diabetes however is a poor candidate for ACE inhibitor given current kidney disease -Patient to return to office in 2 weeks for recheck of blood pressure.

## 2013-09-05 NOTE — Assessment & Plan Note (Signed)
Patient has nonproductive cough that is likely secondary to viral illness. -Patient was counseled to continue over-the-counter guaifenesin and dextromethorphan. -She was counseled to continue to drink plenty of fluids -Warning signs provided to patient including increased fever, worsening cough, increased shortness of breath.

## 2013-09-05 NOTE — Patient Instructions (Addendum)
Elevated Blood Pressure - Your blood pressure medication has been increased. Your new dose is Toprol 100 mg daily.  Cough/Viral Illness - may continue to take Tussin up to 4 times daily as needed for symptom relief. Call the office if you start to have fevers (greater than 100 degrees fareinheit) or if you start to become short of breath.   Return to office in 2 weeks for recheck of blood pressure.

## 2013-09-05 NOTE — Progress Notes (Signed)
  Subjective:    Patient ID: Belinda [name], female    DOB: 07-Jul-1927, 78 y.o.   MRN: [id number]  HPI 79 year old female presents for followup of multiple medical problems.  Hypertension-patient is currently on Toprol 50 mg daily as well as nifedipine XL 60 mg daily. Patient is compliant with her medications, denies side effects, denies chest pain, denies lightheadedness, does have mild headache today however thinks this is related to viral illness, does not have regular headaches, denies orthostasis  Cough-patient developed cough within the past 3-4 days, did have sore throat during last office visit approximately one week ago, she initially did have some associated rhinorrhea as well as postnasal drip which has since resolved, or sore throat has resolved, denies shortness of breath, cough is nonproductive, she has been taking over-the-counter guaifenesin and dextromethorphan which has helped to decrease her cough, denies subjective fevers or chills, she does have multiple sick contacts, no nausea, no emesis, no diarrhea or constipation   Review of Systems  Constitutional: Negative for fever, chills and fatigue.  HENT: Negative for congestion, rhinorrhea and postnasal drip.   Respiratory: Positive for cough. Negative for apnea and shortness of breath.   Gastrointestinal: Negative for nausea, vomiting and diarrhea.  Musculoskeletal: Negative for myalgias.       Objective:   Physical Exam Vitals: Reviewed General: Pleasant Caucasian female, accompanied by her son, no acute distress HEENT: Normocephalic, bilateral TMs are pearly-gray without bulging erythema, pupils are equal round and reactive to light, extraocular movements are intact, nasal septum midline, no rhinorrhea, moist mucous membranes, uvula midline, no pharyngeal erythema or exudate noted, neck was supple, no anterior posterior cervical lymphadenopathy were noted Cardiac: Regular rate and rhythm, S1 and S2 present, no heaves or  thrills, no murmurs Respiratory: Clear to auscultation bilaterally, normal effort, patient was not coughing during examination Abdomen: Soft, nontender, bowel sounds present Extremities: No lower extremity edema       Assessment & Plan:  Please see problem specific assessment and plan.

## 2013-09-22 ENCOUNTER — Encounter: Payer: Self-pay | Admitting: Family Medicine

## 2013-09-22 ENCOUNTER — Ambulatory Visit (INDEPENDENT_AMBULATORY_CARE_PROVIDER_SITE_OTHER): Payer: Medicare Other | Admitting: Family Medicine

## 2013-09-22 VITALS — BP 140/46 | HR 57 | Temp 98.1°F | Ht 59.0 in | Wt 131.8 lb

## 2013-09-22 DIAGNOSIS — K219 Gastro-esophageal reflux disease without esophagitis: Secondary | ICD-10-CM

## 2013-09-22 DIAGNOSIS — M109 Gout, unspecified: Secondary | ICD-10-CM

## 2013-09-22 DIAGNOSIS — I10 Essential (primary) hypertension: Secondary | ICD-10-CM

## 2013-09-22 MED ORDER — METOPROLOL SUCCINATE ER 50 MG PO TB24: 50.0000 mg | ORAL_TABLET | Freq: Every day | ORAL | Status: DC

## 2013-09-22 MED ORDER — ISOSORBIDE MONONITRATE ER 30 MG PO TB24: 30.0000 mg | ORAL_TABLET | Freq: Every day | ORAL | Status: DC

## 2013-09-22 NOTE — Progress Notes (Signed)
  Subjective:    Patient ID: Belinda [name], female    DOB: 1927-05-20, 79 y.o.   MRN: [id number]  HPI 79 year old female presents for followup of multiple medical problems.  Hypertension-at last visit patient's blood pressure was elevated and her Toprol was increased to 100 mg by mouth daily, she however did not take the increased dose and continued to take 50 mg daily. This is because she previously had bradycardia with this increased dose. She does not check her BP at home, per patient was well controlled at recent nephrologist appointment, denies recent orthostasis, no chest pain, no sob, no lightheadeness, no vision changes (does have history of macular degeneration and follows with ophthalmology), states that she is due for an injection of her eye.  She has a history of OA and gout, currently on Allopurinol, taking 100 mg daily, denies recent gout flares, tolerating her medications well.   CKD 3 - recently seen by nephrology, has lab work completed which is not reveiwable in [facility], started on sodium bicarbonate a few days ago, has been having some increased fatigue for the past few days which she associates with the new medications, she has not yet contacted the nephrologist.  GERD - not controlled with Zantac 75 BID and PRN tums, she would like to increase nighttime dose to 150 mg, complains of burning epigastric pain mostly at night   Review of Systems  Constitutional: Negative for fever, chills and fatigue.  Respiratory: Negative for cough and choking.   Cardiovascular: Negative for chest pain.  Gastrointestinal: Negative for nausea, vomiting and diarrhea.  Musculoskeletal: Positive for arthralgias, gait problem and joint swelling. Negative for back pain.       Objective:   Physical Exam Vitals: reviewed Gen: pleasant caucasian female, NAD, accompanied by her son HEENT: PERRL, EOMI, no scleral icterus, MMM, neck was supple, no andenopathy Cardiac: RRR, S1 and S2 present, no murmurs,  no heaves/thrills Resp: CTAB, normal effort Abd: soft, mild epigastric tenderness, bowel sounds present Ext: no edema       Assessment & Plan:  Please see problem specific assessment and plan.

## 2013-09-22 NOTE — Patient Instructions (Addendum)
High Blood Pressure - controlled today, continue Toprol 50 mg BID  Gout - continue to take Allopurinol at current dose  Acid Reflux - may take 150 mg prior to bedtime  Return to office in one month for follow up of diabetes.

## 2013-09-23 NOTE — Assessment & Plan Note (Signed)
Uncontrolled. Will increase nighttime dose of Zantac to 150 mg.

## 2013-09-23 NOTE — Assessment & Plan Note (Signed)
Blood pressure well controlled today. Changed Toprol to 50 mg daily in medical record since this is what she has been taking at home. - No change in pharmacotherapy made today

## 2013-09-23 NOTE — Assessment & Plan Note (Signed)
Well controlled on Allopurinol 100 mg daily. This dose is appropriate given her CKD 3.

## 2013-10-23 ENCOUNTER — Other Ambulatory Visit: Payer: Self-pay | Admitting: Family Medicine

## 2013-10-24 ENCOUNTER — Ambulatory Visit (INDEPENDENT_AMBULATORY_CARE_PROVIDER_SITE_OTHER): Payer: Medicare Other | Admitting: Family Medicine

## 2013-10-24 ENCOUNTER — Encounter: Payer: Self-pay | Admitting: Family Medicine

## 2013-10-24 VITALS — BP 150/68 | HR 60 | Temp 97.6°F | Ht 59.0 in | Wt 131.0 lb

## 2013-10-24 DIAGNOSIS — E1165 Type 2 diabetes mellitus with hyperglycemia: Secondary | ICD-10-CM

## 2013-10-24 DIAGNOSIS — K5900 Constipation, unspecified: Secondary | ICD-10-CM

## 2013-10-24 MED ORDER — DOCUSATE SODIUM 100 MG PO CAPS: 100.0000 mg | ORAL_CAPSULE | Freq: Two times a day (BID) | ORAL | Status: DC

## 2013-10-24 NOTE — Patient Instructions (Signed)
Intermittent constipation and diarrhea - I think that you may have something called overflow incontinence, I would recommend that you take Colace 100 mg twice daily, use Immodium sparingly.  Naval - continue local care with Vasaline, no further recommendations at this time  F/u in one month to check on your bowel habits.

## 2013-10-26 NOTE — Assessment & Plan Note (Signed)
Patient is having intermittent constipation and diarrhea. Overflow incontinence is suspected.  -Initiate daily Colace -Increase fiber in diet -Patient counseled to not use Immodium unless multiple BM's in one day
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
## 2013-11-21 ENCOUNTER — Ambulatory Visit (INDEPENDENT_AMBULATORY_CARE_PROVIDER_SITE_OTHER): Payer: Medicare Other | Admitting: Family Medicine

## 2013-11-21 VITALS — BP 168/68 | HR 70 | Ht 59.0 in | Wt 127.0 lb

## 2013-11-21 DIAGNOSIS — K5900 Constipation, unspecified: Secondary | ICD-10-CM

## 2013-11-21 DIAGNOSIS — F411 Generalized anxiety disorder: Secondary | ICD-10-CM

## 2013-11-21 DIAGNOSIS — G4700 Insomnia, unspecified: Secondary | ICD-10-CM

## 2013-11-21 MED ORDER — TRAMADOL HCL 50 MG PO TABS: 50.0000 mg | ORAL_TABLET | Freq: Two times a day (BID) | ORAL | Status: DC | PRN

## 2013-11-21 MED ORDER — POLYETHYLENE GLYCOL 3350 17 GM/SCOOP PO POWD: 17.0000 g | Freq: Every day | ORAL | Status: DC

## 2013-11-21 NOTE — Patient Instructions (Signed)
Constipation - take Miralax 1/2 cap daily Sleep - start to go to sleep at the same time each night, you need to adjust your sleep schedule Pain - refill for Tramadol provided  Return to office in one month for follow up of constipation/poor sleep.

## 2013-11-22 DIAGNOSIS — G4700 Insomnia, unspecified: Secondary | ICD-10-CM | POA: Insufficient documentation

## 2013-11-22 NOTE — Assessment & Plan Note (Signed)
Likely related to anxiety. -Sleep hygiene discussed, patient to initiate sleep routine -If not improved at next visit will consider increase of Buspar/addition of Mirtazapine.

## 2013-11-22 NOTE — Assessment & Plan Note (Signed)
Suspect that anxiety is still contributing to Insomnia. Currently on Buspar 7.5 mg BID. No change in pharmacotherapy made today.  -If Insomnia not improved with sleep hygiene will consider increasing Buspar/add Mirtazapine at next visit.

## 2013-11-22 NOTE — Progress Notes (Signed)
   Subjective:    Patient ID: Belinda [name], female    DOB: 13-Jan-1927, 79 y.o.   MRN: [id number]  HPI 79 y/o female presents for follow up of multiple medical issues.  Constipation - patient continues to have alternating constipation with diarrhea, she will stay constipated for 5-6 days then have 1-2 days of diarrhea, she was started on Colace at last visit as overflow constipation was suspected, she was unable to tolerate Colace as she has significant flatus/abdominal bloating requiring simethicone to help relieve pressure, symptoms have resolved with discontinuation of Colace, no nausea, no emesis, no blood in stools, currently no abdominal pain  Insomnia - patient reports not being able to fall asleep at night, will often not fall asleep until 5 AM and then will sleep until 1-2 PM, states that when she lays down to sleep she is often not tired, has racing thoughts, she is able to stay asleep, has previously attempted Trazodone which gave her lightheadedness, her current sleep schedule is causing her to miss doses of her medications, no nighttime urinary complaints  Patient requests refill of Tramadol, she has a history of OA of multiple sites, previously prescribed Norco and Oxycodone, she does not wish to continue these medications, Tramadol improves her overall pain and does not cause somnolence   Social: lives alone, relies on her son for transportation    Review of Systems  Constitutional: Negative for fever, chills and fatigue.  Respiratory: Negative for cough, choking and shortness of breath.   Cardiovascular: Negative for chest pain, palpitations and leg swelling.  Gastrointestinal: Positive for diarrhea and constipation. Negative for nausea, vomiting, blood in stool, abdominal distention and rectal pain.  Genitourinary: Negative for dysuria.  Psychiatric/Behavioral: Positive for sleep disturbance. Negative for suicidal ideas and self-injury. The patient is nervous/anxious.    She  reports decreased appetite which was not fully evaluated today.    Objective:   Physical Exam Vital: reviewed Gen: pleasant Caucasian female, accompanied by her son, very talkative Cardiac: RRR, S1 and S2 present, no murmurs, no heaves/thrills Resp: CTAB, normal effort Abd: soft, no tenderness, normal bowel sounds, no rebound, no guarding Ext: no edema, 2+ radial pulses Psych: dressed appropriately, very talkative, changes topics frequently, no SI, no HI       Assessment & Plan:  Please see problem specific assessment and plan

## 2013-11-22 NOTE — Assessment & Plan Note (Signed)
Patient unable to tolerate daily Colace. Overflow diarrhea due to constipation suspected. -Will transition to Miralax 1/2 cap daily

## 2013-12-15 ENCOUNTER — Other Ambulatory Visit: Payer: Self-pay | Admitting: Family Medicine

## 2013-12-17 ENCOUNTER — Emergency Department (HOSPITAL_COMMUNITY): Payer: Medicare Other

## 2013-12-17 ENCOUNTER — Inpatient Hospital Stay (HOSPITAL_COMMUNITY)
Admission: EM | Admit: 2013-12-17 | Discharge: 2013-12-21 | DRG: 811 | Disposition: A | Discharge status: Home / Self Care | Payer: Medicare Other | Attending: Family Medicine | Admitting: Family Medicine

## 2013-12-17 ENCOUNTER — Encounter (HOSPITAL_COMMUNITY): Payer: Self-pay | Admitting: Emergency Medicine

## 2013-12-17 DIAGNOSIS — Q391 Atresia of esophagus with tracheo-esophageal fistula: Secondary | ICD-10-CM

## 2013-12-17 DIAGNOSIS — E876 Hypokalemia: Secondary | ICD-10-CM | POA: Diagnosis present

## 2013-12-17 DIAGNOSIS — Z905 Acquired absence of kidney: Secondary | ICD-10-CM

## 2013-12-17 DIAGNOSIS — N039 Chronic nephritic syndrome with unspecified morphologic changes: Secondary | ICD-10-CM

## 2013-12-17 DIAGNOSIS — N39490 Overflow incontinence: Secondary | ICD-10-CM | POA: Diagnosis present

## 2013-12-17 DIAGNOSIS — K5900 Constipation, unspecified: Secondary | ICD-10-CM

## 2013-12-17 DIAGNOSIS — I10 Essential (primary) hypertension: Secondary | ICD-10-CM | POA: Diagnosis present

## 2013-12-17 DIAGNOSIS — K5731 Diverticulosis of large intestine without perforation or abscess with bleeding: Secondary | ICD-10-CM | POA: Diagnosis present

## 2013-12-17 DIAGNOSIS — M109 Gout, unspecified: Secondary | ICD-10-CM | POA: Diagnosis present

## 2013-12-17 DIAGNOSIS — K639 Disease of intestine, unspecified: Secondary | ICD-10-CM | POA: Diagnosis present

## 2013-12-17 DIAGNOSIS — D638 Anemia in other chronic diseases classified elsewhere: Secondary | ICD-10-CM | POA: Diagnosis present

## 2013-12-17 DIAGNOSIS — M19019 Primary osteoarthritis, unspecified shoulder: Secondary | ICD-10-CM

## 2013-12-17 DIAGNOSIS — I200 Unstable angina: Secondary | ICD-10-CM

## 2013-12-17 DIAGNOSIS — E1149 Type 2 diabetes mellitus with other diabetic neurological complication: Secondary | ICD-10-CM

## 2013-12-17 DIAGNOSIS — E1165 Type 2 diabetes mellitus with hyperglycemia: Secondary | ICD-10-CM | POA: Diagnosis present

## 2013-12-17 DIAGNOSIS — Q393 Congenital stenosis and stricture of esophagus: Secondary | ICD-10-CM

## 2013-12-17 DIAGNOSIS — R011 Cardiac murmur, unspecified: Secondary | ICD-10-CM | POA: Diagnosis present

## 2013-12-17 DIAGNOSIS — I2510 Atherosclerotic heart disease of native coronary artery without angina pectoris: Secondary | ICD-10-CM | POA: Diagnosis present

## 2013-12-17 DIAGNOSIS — Z9861 Coronary angioplasty status: Secondary | ICD-10-CM

## 2013-12-17 DIAGNOSIS — R141 Gas pain: Secondary | ICD-10-CM | POA: Diagnosis present

## 2013-12-17 DIAGNOSIS — R195 Other fecal abnormalities: Secondary | ICD-10-CM | POA: Diagnosis present

## 2013-12-17 DIAGNOSIS — J45909 Unspecified asthma, uncomplicated: Secondary | ICD-10-CM | POA: Diagnosis present

## 2013-12-17 DIAGNOSIS — K922 Gastrointestinal hemorrhage, unspecified: Secondary | ICD-10-CM | POA: Diagnosis present

## 2013-12-17 DIAGNOSIS — Z86010 Personal history of colonic polyps: Secondary | ICD-10-CM

## 2013-12-17 DIAGNOSIS — K219 Gastro-esophageal reflux disease without esophagitis: Secondary | ICD-10-CM | POA: Diagnosis present

## 2013-12-17 DIAGNOSIS — I120 Hypertensive chronic kidney disease with stage 5 chronic kidney disease or end stage renal disease: Secondary | ICD-10-CM | POA: Diagnosis present

## 2013-12-17 DIAGNOSIS — H903 Sensorineural hearing loss, bilateral: Secondary | ICD-10-CM | POA: Diagnosis present

## 2013-12-17 DIAGNOSIS — D631 Anemia in chronic kidney disease: Secondary | ICD-10-CM

## 2013-12-17 DIAGNOSIS — F411 Generalized anxiety disorder: Secondary | ICD-10-CM | POA: Diagnosis present

## 2013-12-17 DIAGNOSIS — N185 Chronic kidney disease, stage 5: Secondary | ICD-10-CM | POA: Diagnosis present

## 2013-12-17 DIAGNOSIS — E118 Type 2 diabetes mellitus with unspecified complications: Secondary | ICD-10-CM

## 2013-12-17 DIAGNOSIS — D649 Anemia, unspecified: Secondary | ICD-10-CM

## 2013-12-17 DIAGNOSIS — R143 Flatulence: Secondary | ICD-10-CM

## 2013-12-17 DIAGNOSIS — D62 Acute posthemorrhagic anemia: Principal | ICD-10-CM | POA: Diagnosis present

## 2013-12-17 DIAGNOSIS — R142 Eructation: Secondary | ICD-10-CM | POA: Diagnosis present

## 2013-12-17 DIAGNOSIS — IMO0002 Reserved for concepts with insufficient information to code with codable children: Secondary | ICD-10-CM | POA: Diagnosis present

## 2013-12-17 DIAGNOSIS — R079 Chest pain, unspecified: Secondary | ICD-10-CM

## 2013-12-17 DIAGNOSIS — E785 Hyperlipidemia, unspecified: Secondary | ICD-10-CM | POA: Diagnosis present

## 2013-12-17 HISTORY — DX: Shortness of breath: R06.02

## 2013-12-17 HISTORY — DX: Other specified postprocedural states: R11.2

## 2013-12-17 HISTORY — DX: Adverse effect of unspecified anesthetic, initial encounter: T41.45XA

## 2013-12-17 HISTORY — DX: Unspecified osteoarthritis, unspecified site: M19.90

## 2013-12-17 HISTORY — DX: Other complications of anesthesia, initial encounter: T88.59XA

## 2013-12-17 HISTORY — DX: Gastro-esophageal reflux disease without esophagitis: K21.9

## 2013-12-17 HISTORY — DX: Essential (primary) hypertension: I10

## 2013-12-17 HISTORY — DX: Unspecified asthma, uncomplicated: J45.909

## 2013-12-17 HISTORY — DX: Other specified postprocedural states: Z98.890

## 2013-12-17 LAB — COMPREHENSIVE METABOLIC PANEL
ALBUMIN: 2.9 g/dL — AB (ref 3.5–5.2)
ALT: 16 U/L (ref 0–35)
AST: 38 U/L — ABNORMAL HIGH (ref 0–37)
Alkaline Phosphatase: 79 U/L (ref 39–117)
BUN: 48 mg/dL — AB (ref 6–23)
CALCIUM: 9.2 mg/dL (ref 8.4–10.5)
CO2: 14 mEq/L — ABNORMAL LOW (ref 19–32)
Chloride: 109 mEq/L (ref 96–112)
Creatinine, Ser: 3.42 mg/dL — ABNORMAL HIGH (ref 0.50–1.10)
GFR calc non Af Amer: 11 mL/min — ABNORMAL LOW (ref >90)
GFR, EST AFRICAN AMERICAN: 13 mL/min — AB (ref >90)
GLUCOSE: 117 mg/dL — AB (ref 70–99)
POTASSIUM: 4.4 meq/L (ref 3.7–5.3)
Sodium: 139 mEq/L (ref 137–147)
TOTAL PROTEIN: 5.9 g/dL — AB (ref 6.0–8.3)
Total Bilirubin: <0.2 mg/dL — ABNORMAL LOW (ref 0.3–1.2)

## 2013-12-17 LAB — CBC
HCT: 23.3 % — ABNORMAL LOW (ref 36.0–46.0)
HEMATOCRIT: 20.8 % — AB (ref 36.0–46.0)
HEMOGLOBIN: 7.1 g/dL — AB (ref 12.0–15.0)
Hemoglobin: 7.8 g/dL — ABNORMAL LOW (ref 12.0–15.0)
MCH: 30.6 pg (ref 26.0–34.0)
MCH: 31.1 pg (ref 26.0–34.0)
MCHC: 33.5 g/dL (ref 30.0–36.0)
MCHC: 34.1 g/dL (ref 30.0–36.0)
MCV: 91.4 fL (ref 78.0–100.0)
MCV: 91.2 fL (ref 78.0–100.0)
Platelets: 190 10*3/uL (ref 150–400)
Platelets: 160 10*3/uL (ref 150–400)
RBC: 2.55 MIL/uL — ABNORMAL LOW (ref 3.87–5.11)
RBC: 2.28 MIL/uL — ABNORMAL LOW (ref 3.87–5.11)
RDW: 15.9 % — AB (ref 11.5–15.5)
RDW: 16.1 % — AB (ref 11.5–15.5)
WBC: 8.8 10*3/uL (ref 4.0–10.5)
WBC: 7.9 10*3/uL (ref 4.0–10.5)

## 2013-12-17 LAB — BASIC METABOLIC PANEL
BUN: 50 mg/dL — ABNORMAL HIGH (ref 6–23)
CALCIUM: 9.7 mg/dL (ref 8.4–10.5)
CO2: 16 meq/L — AB (ref 19–32)
Chloride: 107 mEq/L (ref 96–112)
Creatinine, Ser: 3.63 mg/dL — ABNORMAL HIGH (ref 0.50–1.10)
GFR calc Af Amer: 12 mL/min — ABNORMAL LOW (ref >90)
GFR calc non Af Amer: 11 mL/min — ABNORMAL LOW (ref >90)
Glucose, Bld: 122 mg/dL — ABNORMAL HIGH (ref 70–99)
Potassium: 4.5 mEq/L (ref 3.7–5.3)
Sodium: 139 mEq/L (ref 137–147)

## 2013-12-17 LAB — APTT: aPTT: 29 seconds (ref 24–37)

## 2013-12-17 LAB — PROTIME-INR
INR: 1 (ref 0.00–1.49)
Prothrombin Time: 13 seconds (ref 11.6–15.2)

## 2013-12-17 LAB — TROPONIN I: Troponin I: <0.3 ng/mL (ref <0.30)

## 2013-12-17 LAB — HEMOGLOBIN AND HEMATOCRIT, BLOOD
HCT: 25.4 % — ABNORMAL LOW (ref 36.0–46.0)
HEMOGLOBIN: 8.9 g/dL — AB (ref 12.0–15.0)

## 2013-12-17 LAB — OCCULT BLOOD, POC DEVICE: Fecal Occult Bld: POSITIVE — AB

## 2013-12-17 LAB — MAGNESIUM: MAGNESIUM: 1.9 mg/dL (ref 1.5–2.5)

## 2013-12-17 LAB — PRO B NATRIURETIC PEPTIDE: Pro B Natriuretic peptide (BNP): 7879 pg/mL — ABNORMAL HIGH (ref 0–450)

## 2013-12-17 LAB — PREPARE RBC (CROSSMATCH)

## 2013-12-17 LAB — POCT I-STAT TROPONIN I: Troponin i, poc: 0.03 ng/mL (ref 0.00–0.08)

## 2013-12-17 MED ORDER — SODIUM CHLORIDE 0.9 % IV SOLN
INTRAVENOUS | Status: DC
  Administered 2013-12-17: via INTRAVENOUS
  Administered 2013-12-18: 500 mL via INTRAVENOUS

## 2013-12-17 MED ORDER — ALLOPURINOL 100 MG PO TABS
100.0000 mg | ORAL_TABLET | Freq: Every day | ORAL | Status: DC
  Administered 2013-12-17 – 2013-12-21 (×5): 100 mg via ORAL
  Filled 2013-12-17 (×5): qty 1

## 2013-12-17 MED ORDER — BENZONATATE 100 MG PO CAPS
200.0000 mg | ORAL_CAPSULE | Freq: Three times a day (TID) | ORAL | Status: DC | PRN
  Administered 2013-12-17 – 2013-12-18 (×2): 200 mg via ORAL
  Filled 2013-12-17 (×2): qty 2

## 2013-12-17 MED ORDER — SODIUM BICARBONATE 650 MG PO TABS
650.0000 mg | ORAL_TABLET | Freq: Two times a day (BID) | ORAL | Status: DC
Start: 2013-12-17 — End: 2013-12-19
  Administered 2013-12-17 – 2013-12-19 (×5): 650 mg via ORAL
  Filled 2013-12-17 (×6): qty 1

## 2013-12-17 MED ORDER — HYDRALAZINE HCL 20 MG/ML IJ SOLN
10.0000 mg | INTRAMUSCULAR | Status: DC | PRN
  Administered 2013-12-17: 10 mg via INTRAVENOUS
  Filled 2013-12-17: qty 1

## 2013-12-17 MED ORDER — ASPIRIN 81 MG PO CHEW
324.0000 mg | CHEWABLE_TABLET | Freq: Once | ORAL | Status: AC
  Administered 2013-12-17: 324 mg via ORAL
  Filled 2013-12-17: qty 4

## 2013-12-17 MED ORDER — ASPIRIN 81 MG PO CHEW
81.0000 mg | CHEWABLE_TABLET | Freq: Every day | ORAL | Status: DC
  Administered 2013-12-17 – 2013-12-21 (×5): 81 mg via ORAL
  Filled 2013-12-17 (×6): qty 1

## 2013-12-17 MED ORDER — SIMVASTATIN 20 MG PO TABS
20.0000 mg | ORAL_TABLET | Freq: Every day | ORAL | Status: DC
  Administered 2013-12-17 – 2013-12-20 (×4): 20 mg via ORAL
  Filled 2013-12-17 (×5): qty 1

## 2013-12-17 MED ORDER — PANTOPRAZOLE SODIUM 40 MG PO TBEC
40.0000 mg | DELAYED_RELEASE_TABLET | Freq: Two times a day (BID) | ORAL | Status: DC
  Administered 2013-12-17: 40 mg via ORAL
  Filled 2013-12-17: qty 1

## 2013-12-17 MED ORDER — ALBUTEROL SULFATE (2.5 MG/3ML) 0.083% IN NEBU: 3.0000 mL | INHALATION_SOLUTION | RESPIRATORY_TRACT | Status: DC | PRN

## 2013-12-17 MED ORDER — TRAMADOL HCL 50 MG PO TABS
50.0000 mg | ORAL_TABLET | Freq: Two times a day (BID) | ORAL | Status: DC | PRN
  Administered 2013-12-20 (×2): 50 mg via ORAL
  Filled 2013-12-17 (×2): qty 1

## 2013-12-17 MED ORDER — SIMETHICONE 80 MG PO CHEW
80.0000 mg | CHEWABLE_TABLET | Freq: Four times a day (QID) | ORAL | Status: DC | PRN
  Filled 2013-12-17: qty 1

## 2013-12-17 MED ORDER — NIFEDIPINE ER 60 MG PO TB24
60.0000 mg | ORAL_TABLET | Freq: Every day | ORAL | Status: DC
  Administered 2013-12-17 – 2013-12-21 (×5): 60 mg via ORAL
  Filled 2013-12-17 (×5): qty 1

## 2013-12-17 MED ORDER — FLUTICASONE PROPIONATE 50 MCG/ACT NA SUSP
2.0000 | Freq: Every day | NASAL | Status: DC
  Administered 2013-12-17 – 2013-12-21 (×4): 2 via NASAL
  Filled 2013-12-17: qty 16

## 2013-12-17 MED ORDER — ACETAMINOPHEN 325 MG PO TABS
650.0000 mg | ORAL_TABLET | ORAL | Status: DC | PRN
  Administered 2013-12-18: 650 mg via ORAL
  Filled 2013-12-17: qty 2

## 2013-12-17 MED ORDER — MORPHINE SULFATE 2 MG/ML IJ SOLN: 1.0000 mg | INTRAMUSCULAR | Status: DC | PRN

## 2013-12-17 MED ORDER — FUROSEMIDE 20 MG PO TABS
20.0000 mg | ORAL_TABLET | Freq: Every day | ORAL | Status: DC
  Administered 2013-12-17 – 2013-12-21 (×5): 20 mg via ORAL
  Filled 2013-12-17 (×5): qty 1

## 2013-12-17 MED ORDER — PANTOPRAZOLE SODIUM 40 MG IV SOLR
40.0000 mg | Freq: Two times a day (BID) | INTRAVENOUS | Status: DC
  Filled 2013-12-17 (×2): qty 40

## 2013-12-17 MED ORDER — LABETALOL HCL 5 MG/ML IV SOLN
10.0000 mg | Freq: Once | INTRAVENOUS | Status: AC
  Administered 2013-12-17: 10 mg via INTRAVENOUS
  Filled 2013-12-17: qty 4

## 2013-12-17 MED ORDER — METOPROLOL SUCCINATE ER 50 MG PO TB24
50.0000 mg | ORAL_TABLET | Freq: Every day | ORAL | Status: DC
  Administered 2013-12-17: 50 mg via ORAL
  Filled 2013-12-17 (×2): qty 1

## 2013-12-17 MED ORDER — BUSPIRONE HCL 15 MG PO TABS
7.5000 mg | ORAL_TABLET | Freq: Two times a day (BID) | ORAL | Status: DC
  Administered 2013-12-17 – 2013-12-21 (×9): 7.5 mg via ORAL
  Filled 2013-12-17 (×10): qty 1

## 2013-12-17 MED ORDER — VITAMIN B-6 100 MG PO TABS
100.0000 mg | ORAL_TABLET | Freq: Every day | ORAL | Status: DC
  Administered 2013-12-17 – 2013-12-21 (×5): 100 mg via ORAL
  Filled 2013-12-17 (×5): qty 1

## 2013-12-17 MED ORDER — ISOSORBIDE MONONITRATE ER 30 MG PO TB24
30.0000 mg | ORAL_TABLET | Freq: Every day | ORAL | Status: DC
  Administered 2013-12-17 – 2013-12-21 (×5): 30 mg via ORAL
  Filled 2013-12-17 (×5): qty 1

## 2013-12-17 MED ORDER — PANTOPRAZOLE SODIUM 40 MG IV SOLR
40.0000 mg | Freq: Two times a day (BID) | INTRAVENOUS | Status: DC
  Administered 2013-12-17 – 2013-12-18 (×3): 40 mg via INTRAVENOUS
  Filled 2013-12-17 (×5): qty 40

## 2013-12-17 MED ORDER — ONDANSETRON HCL 4 MG/2ML IJ SOLN
4.0000 mg | Freq: Four times a day (QID) | INTRAMUSCULAR | Status: DC | PRN
  Administered 2013-12-18: 4 mg via INTRAVENOUS
  Filled 2013-12-17: qty 2

## 2013-12-17 NOTE — ED Notes (Signed)
Pt to get PRN hydralazine.  If pt responds, floor will accept.

## 2013-12-17 NOTE — ED Notes (Signed)
Pt transported to radiology.

## 2013-12-17 NOTE — Progress Notes (Signed)
Utilization review completed.  

## 2013-12-17 NOTE — ED Provider Notes (Signed)
CSN: [id number]     Arrival date & time 12/17/13  0158 History   First MD Initiated Contact with Patient 12/17/13 0201     Chief Complaint  Patient presents with  . Chest Pain   (Consider location/radiation/quality/duration/timing/severity/associated sxs/prior Treatment) HPI Patient reports chest tightness that started around 9:30 PM this evening. It was associated with left arm pain and exertional dyspnea but without nausea or diaphoresis. Patient states she took 2 nitroglycerin with some relief. She's concerned that her blood pressure remains elevated. She denies any cough or fever. She has no new lower extremity swelling. Denies any recent changes in her blood pressure medication. Past Medical History  Diagnosis Date  . Stenosis, spinal, lumbar 05/20/2006    L 3-4, 4-5  MRI  . Bilateral sensorineural hearing loss 10/2006    audiogram  . Diabetes mellitus   . Chronic kidney disease   . Diverticulosis of colon   . H/O endoscopy 11/2006    benign duodenal scar  . Normal cardiac stress test 01/30/2006    Cardiolite EF 54%  . Abnormal barium swallow 12/2009    ?thyroid nodule   Past Surgical History  Procedure Laterality Date  . Combined hysterectomy abdominal w/ a&p repair / oophorectomy  1960's    for endometriosis  . Cholecystectomy open  2002  . Basal cell carcinoma excision  06/2003    gluteal fold  . Retinal laser procedure  10/2003    left eye  . Nephrectomy  12/2003    left due to non-function  . Minor breast biopsy  06/2010    right   . Esophageal dilation  10/2012    [place] [facility] Dr Amedeo Plenty   Family History  Problem Relation Age of Onset  . Asthma Son   . Cancer Father     brain tumor  . Diabetes Father   . Cancer Mother     liver   History  Substance Use Topics  . Smoking status: Never Smoker   . Smokeless tobacco: Never Used  . Alcohol Use: No   OB History   Grav Para Term Preterm Abortions TAB SAB Ect Mult Living                 Review of Systems   Constitutional: Negative for fever and chills.  Respiratory: Positive for chest tightness. Negative for shortness of breath.   Cardiovascular: Negative for chest pain, palpitations and leg swelling.  Gastrointestinal: Negative for nausea, vomiting, abdominal pain, diarrhea and constipation.  Genitourinary: Negative for flank pain.  Musculoskeletal: Negative for back pain, myalgias, neck pain and neck stiffness.  Skin: Negative for rash and wound.  Neurological: Positive for headaches. Negative for dizziness, weakness and numbness.  All other systems reviewed and are negative.    Allergies  Fexofenadine; Trazodone and nefazodone; Ciprofloxacin; Diphenhydramine hcl; Gabapentin; Procaine hcl; Propofol; Sulfamethoxazole-trimethoprim; Codeine; and Doxycycline hyclate  Home Medications   Current Outpatient Rx  Name  Route  Sig  Dispense  Refill  . acetaminophen (TYLENOL) 650 MG CR tablet   Oral   Take 650 mg by mouth every 8 (eight) hours as needed for pain.         Marland Kitchen allopurinol (ZYLOPRIM) 100 MG tablet      take 1 tablet by mouth once daily   90 tablet   3   . aspirin 81 MG tablet   Oral   Take 81 mg by mouth daily.         . busPIRone (BUSPAR) 15 MG  tablet   Oral   Take 0.5 tablets (7.5 mg total) by mouth 2 (two) times daily.   30 tablet   5   . Cetirizine HCl 10 MG CAPS   Oral   Take 1 capsule (10 mg total) by mouth at bedtime. OTC   90 capsule   3   . ferrous sulfate 325 (65 FE) MG tablet   Oral   Take 325 mg by mouth daily with breakfast.         . fluticasone (FLONASE) 50 MCG/ACT nasal spray      instill 2 sprays into each nostril every morning   16 g   11   . furosemide (LASIX) 20 MG tablet   Oral   Take 1 tablet (20 mg total) by mouth daily.   90 tablet   3   . isosorbide mononitrate (IMDUR) 30 MG 24 hr tablet   Oral   Take 1 tablet (30 mg total) by mouth daily.   30 tablet   5   . lidocaine (LIDODERM) 5 %   Transdermal   Place 1 patch  onto the skin daily. Remove & Discard patch within 12 hours or as directed by MD   30 patch   2   . metoprolol succinate (TOPROL-XL) 50 MG 24 hr tablet   Oral   Take 1 tablet (50 mg total) by mouth daily. Take with or immediately following a meal.   90 tablet   3   . NIFEDICAL XL 60 MG 24 hr tablet      take 1 tablet by mouth once daily   90 tablet   1   . NITROSTAT 0.4 MG SL tablet      PLACE 1 TABLET UNDER THE TONGUE AS NEEDED FOR THE CHEST PAIN, MAY REPEAT IN 5 MINUTES 2 TIMES  BUT IF STILL HAVING PROBLEMS ACCESS EMERGENCY   25 tablet   4   . PROAIR HFA 108 (90 BASE) MCG/ACT inhaler      inhale 2 puffs by mouth four times a day if needed   18 g   1   . Pyridoxine HCl (VITAMIN B-6) 100 MG tablet   Oral   Take 100 mg by mouth daily.          . simethicone (MYLICON) 80 MG chewable tablet   Oral   Chew 80 mg by mouth every 6 (six) hours as needed for flatulence.         . simvastatin (ZOCOR) 20 MG tablet      take 1 tablet by mouth once daily   90 tablet   3     Note decrease in tablet strength due to renal fail ...   . sodium bicarbonate 650 MG tablet   Oral   Take 650 mg by mouth 2 (two) times daily. 2 tablets BID         . traMADol (ULTRAM) 50 MG tablet   Oral   Take 1 tablet (50 mg total) by mouth every 12 (twelve) hours as needed.   60 tablet   0   . glucose blood (BAYER CONTOUR TEST) test strip      Test fasting and when symptoms of low blood sugar   100 each   11   . loperamide (IMODIUM) 2 MG capsule   Oral   Take 2 mg by mouth 4 (four) times daily as needed.           . polyethylene glycol powder (GLYCOLAX/MIRALAX) powder  Oral   Take 17 g by mouth daily.   3350 g   1    BP 208/80  Pulse 89  Resp 14  SpO2 100% Physical Exam  Nursing note and vitals reviewed. Constitutional: She is oriented to person, place, and time. She appears well-developed and well-nourished. No distress.  HENT:  Head: Normocephalic and atraumatic.   Mouth/Throat: Oropharynx is clear and moist. No oropharyngeal exudate.  Eyes: EOM are normal. Pupils are equal, round, and reactive to light.  Neck: Normal range of motion. Neck supple.  Cardiovascular: Normal rate and regular rhythm.   Pulmonary/Chest: Effort normal and breath sounds normal. No respiratory distress. She has no wheezes. She has no rales. She exhibits no tenderness.  Abdominal: Soft. Bowel sounds are normal. She exhibits no distension and no mass. There is no tenderness. There is no rebound and no guarding.  Musculoskeletal: Normal range of motion. She exhibits no edema and no tenderness.  No calf swelling or tenderness.  Neurological: She is alert and oriented to person, place, and time.  Moves all extremities without deficit. Sensation grossly intact.  Skin: Skin is warm and dry. No rash noted. No erythema.  Psychiatric: She has a normal mood and affect. Her behavior is normal.    ED Course  Procedures (including critical care time) Labs Review Labs Reviewed  CBC - Abnormal; Notable for the following:    RBC 2.55 (*)    Hemoglobin 7.8 (*)    HCT 23.3 (*)    RDW 15.9 (*)    All other components within normal limits  BASIC METABOLIC PANEL - Abnormal; Notable for the following:    CO2 16 (*)    Glucose, Bld 122 (*)    BUN 50 (*)    Creatinine, Ser 3.63 (*)    GFR calc non Af Amer 11 (*)    GFR calc Af Amer 12 (*)    All other components within normal limits  PRO B NATRIURETIC PEPTIDE - Abnormal; Notable for the following:    Pro B Natriuretic peptide (BNP) 7879.0 (*)    All other components within normal limits  PROTIME-INR  APTT  POCT I-STAT TROPONIN I  TYPE AND SCREEN   Imaging Review Dg Chest 2 View  12/17/2013   CLINICAL DATA:  Chest pain  EXAM: CHEST  2 VIEW  COMPARISON:  02/25/2013  FINDINGS: Generous heart size. No acute contour change in the upper mediastinum. Coronary arterial stents. Right diaphragmatic eventration. No edema or infiltrate. No  effusion or pneumothorax.  IMPRESSION: No active cardiopulmonary disease.   Electronically Signed   By: Jorje Guild M.D.   On: 12/17/2013 02:33    EKG Interpretation   None      Date: 12/17/2013  Rate: 94  Rhythm: normal sinus rhythm  QRS Axis: normal  Intervals: normal  ST/T Wave abnormalities: nonspecific T wave changes  Conduction Disutrbances:nonspecific intraventricular conduction delay  Narrative Interpretation:   Old EKG Reviewed: changes noted  Questionable new T-wave inversion in V3  MDM   1. Chest pain   2. Hypertension   3. Anemia    Discussed with family medicine teaching service and will see the patient in emergency department and admit.    Julianne Rice, MD 12/17/13 [phone]

## 2013-12-17 NOTE — ED Notes (Signed)
Belinda [name] [phone]

## 2013-12-17 NOTE — Consult Note (Signed)
Reason for Consult: Chest pain Referring Physician:  Cardiologist:  Belinda [name] is an 80 y.o. female.  HPI:   The patient is an 80 yo female with a history of CAD, CKD-stage 5, DM, HLD,  HTN, GERD, asthma, anemia of chronic disease.  Her last 2D echo was Feb 2012- EF 60-65% with a sclerotic aortic valve.  Her last cardiac cath was 05/27/2007 at which time she had PCI with Taxus DESs in the mid LAD, mid circ.  The patient presented Very early this morning after developing severe chest tightness and left arm pain.  No   She initially thought it was gas because she was belching a lot, and still is.  She took a gas pill which did not help.  She then took three SL NTG and noticed relief after each one but realized she need to go to the ER.  She is currently pain free.  She also reports black tarry stools.  Her last colonoscopy was about 8 years ago and Dr. Amedeo Plenty has performed an EGD within the last eight months.  The patient currently denies nausea, vomiting, fever, orthopnea, dizziness, PND, cough, congestion, abdominal pain, hematochezia, lower extremity edema.   Past Medical History  Diagnosis Date  . Stenosis, spinal, lumbar 05/20/2006    L 3-4, 4-5  MRI  . Bilateral sensorineural hearing loss 10/2006    audiogram  . Diabetes mellitus   . Chronic kidney disease   . Diverticulosis of colon   . H/O endoscopy 11/2006    benign duodenal scar  . Normal cardiac stress test 01/30/2006    Cardiolite EF 54%  . Abnormal barium swallow 12/2009    ?thyroid nodule  . Complication of anesthesia     ' I cant take propaphol "  . Hypertension   . Shortness of breath   . Asthma   . GERD (gastroesophageal reflux disease)   . Arthritis     Past Surgical History  Procedure Laterality Date  . Combined hysterectomy abdominal w/ a&p repair / oophorectomy  1960's    for endometriosis  . Cholecystectomy open  2002  . Basal cell carcinoma excision  06/2003    gluteal fold  . Retinal laser  procedure  10/2003    left eye  . Nephrectomy  12/2003    left due to non-function  . Minor breast biopsy  06/2010    right   . Esophageal dilation  10/2012    [place] [facility] Dr Amedeo Plenty  . Abdominal hysterectomy      Family History  Problem Relation Age of Onset  . Asthma Son   . Cancer Father     brain tumor  . Diabetes Father   . Cancer Mother     liver    Social History:  reports that she has never smoked. She has never used smokeless tobacco. She reports that she does not drink alcohol or use illicit drugs.  Allergies:  Allergies  Allergen Reactions  . Fexofenadine Other (See Comments)    confusion  . Trazodone And Nefazodone     Lightheadedness  . Ciprofloxacin Other (See Comments)    REACTION: Headache and stomachache  . Diphenhydramine Hcl Other (See Comments)    REACTION: hyperactive  . Gabapentin Other (See Comments)    REACTION: leg pain  . Procaine Hcl     REACTION: Rash  . Propofol Diarrhea and Nausea And Vomiting    Had endoscopy at Dr Amedeo Plenty 10/2012  . Sulfamethoxazole-Trimethoprim  REACTION: Nausea  . Codeine Rash    REACTION: Rash  . Doxycycline Hyclate Rash    REACTION: rash?    Medications:  Scheduled Meds: . allopurinol  100 mg Oral Daily  . aspirin  81 mg Oral Daily  . busPIRone  7.5 mg Oral BID  . fluticasone  2 spray Each Nare Daily  . furosemide  20 mg Oral Daily  . isosorbide mononitrate  30 mg Oral Daily  . metoprolol succinate  50 mg Oral Daily  . NIFEdipine  60 mg Oral Daily  . pantoprazole  40 mg Oral BID  . pyridOXINE  100 mg Oral Daily  . simvastatin  20 mg Oral q1800  . sodium bicarbonate  650 mg Oral BID   Continuous Infusions:  PRN Meds:.acetaminophen, albuterol, hydrALAZINE, morphine injection, ondansetron (ZOFRAN) IV, simethicone, traMADol   Results for orders placed during the hospital encounter of 12/17/13 (from the past 48 hour(s))  PRO B NATRIURETIC PEPTIDE     Status: Abnormal   Collection Time    12/17/13   2:05 AM      Result Value Range   Pro B Natriuretic peptide (BNP) 7879.0 (*) 0 - 450 pg/mL  CBC     Status: Abnormal   Collection Time    12/17/13  2:13 AM      Result Value Range   WBC 8.8  4.0 - 10.5 K/uL   RBC 2.55 (*) 3.87 - 5.11 MIL/uL   Hemoglobin 7.8 (*) 12.0 - 15.0 g/dL   HCT 23.3 (*) 36.0 - 46.0 %   MCV 91.4  78.0 - 100.0 fL   MCH 30.6  26.0 - 34.0 pg   MCHC 33.5  30.0 - 36.0 g/dL   RDW 15.9 (*) 11.5 - 15.5 %   Platelets 190  150 - 400 K/uL  BASIC METABOLIC PANEL     Status: Abnormal   Collection Time    12/17/13  2:13 AM      Result Value Range   Sodium 139  137 - 147 mEq/L   Potassium 4.5  3.7 - 5.3 mEq/L   Chloride 107  96 - 112 mEq/L   CO2 16 (*) 19 - 32 mEq/L   Glucose, Bld 122 (*) 70 - 99 mg/dL   BUN 50 (*) 6 - 23 mg/dL   Creatinine, Ser 3.63 (*) 0.50 - 1.10 mg/dL   Calcium 9.7  8.4 - 10.5 mg/dL   GFR calc non Af Amer 11 (*) >90 mL/min   GFR calc Af Amer 12 (*) >90 mL/min   Comment: (NOTE)     The eGFR has been calculated using the CKD EPI equation.     This calculation has not been validated in all clinical situations.     eGFR's persistently <90 mL/min signify possible Chronic Kidney     Disease.  POCT I-STAT TROPONIN I     Status: None   Collection Time    12/17/13  2:16 AM      Result Value Range   Troponin i, poc 0.03  0.00 - 0.08 ng/mL   Comment 3            Comment: Due to the release kinetics of cTnI,     a negative result within the first hours     of the onset of symptoms does not rule out     myocardial infarction with certainty.     If myocardial infarction is still suspected,     repeat the test at appropriate  intervals.  PROTIME-INR     Status: None   Collection Time    12/17/13  3:15 AM      Result Value Range   Prothrombin Time 13.0  11.6 - 15.2 seconds   INR 1.00  0.00 - 1.49  APTT     Status: None   Collection Time    12/17/13  3:15 AM      Result Value Range   aPTT 29  24 - 37 seconds  TYPE AND SCREEN     Status: None    Collection Time    12/17/13  3:25 AM      Result Value Range   ABO/RH(D) O POS     Antibody Screen NEG     Sample Expiration 12/20/2013     Unit Number [id number]     Blood Component Type RED CELLS,LR     Unit division 00     Status of Unit ISSUED     Transfusion Status OK TO TRANSFUSE     Crossmatch Result Compatible     Unit Number [id number]     Blood Component Type RED CELLS,LR     Unit division 00     Status of Unit ALLOCATED     Transfusion Status OK TO TRANSFUSE     Crossmatch Result Compatible    OCCULT BLOOD, POC DEVICE     Status: Abnormal   Collection Time    12/17/13  4:20 AM      Result Value Range   Fecal Occult Bld POSITIVE (*) NEGATIVE  TROPONIN I     Status: None   Collection Time    12/17/13  4:55 AM      Result Value Range   Troponin I <0.30  <0.30 ng/mL   Comment:            Due to the release kinetics of cTnI,     a negative result within the first hours     of the onset of symptoms does not rule out     myocardial infarction with certainty.     If myocardial infarction is still suspected,     repeat the test at appropriate intervals.  CBC     Status: Abnormal   Collection Time    12/17/13  5:39 AM      Result Value Range   WBC 7.9  4.0 - 10.5 K/uL   RBC 2.28 (*) 3.87 - 5.11 MIL/uL   Hemoglobin 7.1 (*) 12.0 - 15.0 g/dL   HCT 20.8 (*) 36.0 - 46.0 %   MCV 91.2  78.0 - 100.0 fL   MCH 31.1  26.0 - 34.0 pg   MCHC 34.1  30.0 - 36.0 g/dL   RDW 16.1 (*) 11.5 - 15.5 %   Platelets 160  150 - 400 K/uL  COMPREHENSIVE METABOLIC PANEL     Status: Abnormal   Collection Time    12/17/13  5:39 AM      Result Value Range   Sodium 139  137 - 147 mEq/L   Potassium 4.4  3.7 - 5.3 mEq/L   Chloride 109  96 - 112 mEq/L   CO2 14 (*) 19 - 32 mEq/L   Glucose, Bld 117 (*) 70 - 99 mg/dL   BUN 48 (*) 6 - 23 mg/dL   Creatinine, Ser 3.42 (*) 0.50 - 1.10 mg/dL   Calcium 9.2  8.4 - 10.5 mg/dL   Total Protein 5.9 (*) 6.0 - 8.3 g/dL   Albumin 2.9 (*)  3.5 - 5.2 g/dL    AST 38 (*) 0 - 37 U/L   ALT 16  0 - 35 U/L   Alkaline Phosphatase 79  39 - 117 U/L   Total Bilirubin <0.2 (*) 0.3 - 1.2 mg/dL   GFR calc non Af Amer 11 (*) >90 mL/min   GFR calc Af Amer 13 (*) >90 mL/min   Comment: (NOTE)     The eGFR has been calculated using the CKD EPI equation.     This calculation has not been validated in all clinical situations.     eGFR's persistently <90 mL/min signify possible Chronic Kidney     Disease.  MAGNESIUM     Status: None   Collection Time    12/17/13  5:39 AM      Result Value Range   Magnesium 1.9  1.5 - 2.5 mg/dL  PREPARE RBC (CROSSMATCH)     Status: None   Collection Time    12/17/13  7:55 AM      Result Value Range   Order Confirmation ORDER PROCESSED BY BLOOD BANK    TROPONIN I     Status: None   Collection Time    12/17/13  9:50 AM      Result Value Range   Troponin I <0.30  <0.30 ng/mL   Comment:            Due to the release kinetics of cTnI,     a negative result within the first hours     of the onset of symptoms does not rule out     myocardial infarction with certainty.     If myocardial infarction is still suspected,     repeat the test at appropriate intervals.    Dg Chest 2 View  12/17/2013   CLINICAL DATA:  Chest pain  EXAM: CHEST  2 VIEW  COMPARISON:  02/25/2013  FINDINGS: Generous heart size. No acute contour change in the upper mediastinum. Coronary arterial stents. Right diaphragmatic eventration. No edema or infiltrate. No effusion or pneumothorax.  IMPRESSION: No active cardiopulmonary disease.   Electronically Signed   By: Jorje Guild M.D.   On: 12/17/2013 02:33    Review of Systems  Constitutional: Negative for fever and diaphoresis.  HENT: Negative for congestion.   Respiratory: Positive for cough and shortness of breath.   Cardiovascular: Positive for chest pain. Negative for orthopnea, leg swelling and PND.  Gastrointestinal: Positive for abdominal pain and melena. Negative for nausea, vomiting,  constipation and blood in stool.  Genitourinary: Negative for hematuria.  Musculoskeletal:       Associated left arm pain.  Neurological: Negative for dizziness.  All other systems reviewed and are negative.   Blood pressure 126/68, pulse 76, temperature 98.2 F (36.8 C), temperature source Oral, resp. rate 22, height 4' 11"  (1.499 m), weight 125 lb 14.1 oz (57.1 kg), SpO2 100.00%. Physical Exam  Constitutional: She is oriented to person, place, and time. She appears well-developed and well-nourished. No distress.  HENT:  Head: Normocephalic and atraumatic.  Eyes: EOM are normal. Pupils are equal, round, and reactive to light. No scleral icterus.  Neck: Normal range of motion. Neck supple. No JVD present.  Cardiovascular: Normal rate, regular rhythm, S1 normal and S2 normal.   Murmur heard.  Systolic murmur is present with a grade of 1/6  Pulses:      Radial pulses are 2+ on the right side, and 2+ on the left side.  Dorsalis pedis pulses are 2+ on the right side, and 2+ on the left side.  No Carotid bruits.  Respiratory: Effort normal and breath sounds normal. No respiratory distress. She has no wheezes. She has no rales.  GI: Soft. Bowel sounds are normal. She exhibits no distension. There is no tenderness.  Musculoskeletal: She exhibits no edema.  Lymphadenopathy:    She has no cervical adenopathy.  Neurological: She is alert and oriented to person, place, and time. She exhibits abnormal muscle tone.  Skin: Skin is warm and dry.  Psychiatric: She has a normal mood and affect.    Assessment/Plan: Principal Problem:   GIB (gastrointestinal bleeding) Active Problems:   Unstable angina   Acute blood loss anemia   DIABETES MELLITUS, II, COMPLICATIONS   HYPERLIPIDEMIA   HYPERTENSION   CKD (chronic kidney disease) stage 5, GFR less than 15 ml/min   CAD (coronary artery disease)     Plan:  80 yo patient with known CAD presents with unstable angina/demand ischemia in the  setting of GIB and acute anemia with HGB of 7.1.   Recommend transfusing to Hgb of 10.  Will obtain 2D echo as her last was Feb 2012.  Medical mgt given we will not be able to use antiplatelet agents.  Watch for volume excess during transfusion.  She was very hypertensive but last BP was 126/68.  Continue to monitor.  ASA, lasix 103m daily, Toprol XL 565m Nifedipine 60, Imdur 30, zocor 20.    HAGER, BRYAN 12/17/2013, 2:42 PM    Patient is comfortable and getting transfused.  History of stents by Dr EdIlda Foiln 2006 Circ and LAD  Followed by Dr VaChrist Kickast seen about 6 months ago.  No acute ECG changes  And R/O Cath and intervention contraindicated in setting of tarry stools and acute GI bleed.  Plavix had been stopped earlier this year due to anemia and stents being placed a long time ago.  Transfuse And continue medical Rx.  If her angina goes away with better Hb best approach given age and GI bleed might be to risk stratify with myovue as outpatient.  Exam with SEM Echo pending GI doctor is HaAmedeo Plentyho has not seen her in house yet    PeJenkins Rouge

## 2013-12-17 NOTE — H&P (Signed)
Seen and examined.  Discussed with Dr Thomes Dinning.  Agree with her management and documentation.  Briefly,  81 yo female with known CAD presents with typical angina.  Light sleeper and had substernal chest pain radiating to left arm - similar to previous cardiac events.  Relieved by nitro, now pain free.  (Hx is a little muddy in that she has chronic left arm and shoulder pain but last night pain was different.)  Has CKD stage 5 and I understand she does not plan dialysis.  Found to be more anemic than usual.  Issues: 1. Chest pain highly suggestive of angina/ACS.  Now pain free.  If she was younger and had less chronic disease burden, there would be no question that further cardiac WU indicated.  As of now, I lean toward a medical management (and transfuse) approach, but will involve her cardiologist to help with decision making. 2. CKD Stage 5, nicely stable.  Watch drug doses. 3. Symptomatic anemia which may be contributing to chest pain.  Agree with transfusion.  If iron has been adaquately replaced, may need epo.

## 2013-12-17 NOTE — Progress Notes (Addendum)
Pt had cp this am. 4 L o2 applied and chest pain went away.  Patient refused nitro.  MD made aware, no new orders received.

## 2013-12-17 NOTE — Consult Note (Signed)
Referring Provider: Dr. Ree Kida Primary Care Physician:  Lupita Dawn, MD Primary Gastroenterologist:  Dr. Amedeo Plenty  Reason for Consultation:  Melena  HPI: Belinda [name] is a 80 y.o. female being seen for a consult due to black tarry stools that have been going on for the past 3 weeks. Stools have been very malodorous as well during those 3 weeks and the black stools started at the same time as starting iron pills, which she was taking once every 3 days. +frequent belching. +gas and decreased appetite. "gurgling" sensation in her abdomen. Denies abdominal pain or pressure. Denies N/V/hematochezia/fevers. Has been having heartburn and taking Protonix but states that was changed because she did not tolerate it. EGD in 10/2012 by Dr. Amedeo Plenty where a Schatzki's ring was dilated. Colonoscopy in 2003 showed internal hemorrhoids. Denies NSAIDs. She was admitted for chest pain and ruled out for MI. History of coronary stents and Plavix stopped last year due to anemia with time of previous stents per Dr. Johnsie Cancel. Patient undergoing transfusion.     Past Medical History  Diagnosis Date  . Stenosis, spinal, lumbar 05/20/2006    L 3-4, 4-5  MRI  . Bilateral sensorineural hearing loss 10/2006    audiogram  . Diabetes mellitus   . Chronic kidney disease   . Diverticulosis of colon   . H/O endoscopy 11/2006    benign duodenal scar  . Normal cardiac stress test 01/30/2006    Cardiolite EF 54%  . Abnormal barium swallow 12/2009    ?thyroid nodule  . Complication of anesthesia     ' I cant take propaphol "  . Hypertension   . Shortness of breath   . Asthma   . GERD (gastroesophageal reflux disease)   . Arthritis     Past Surgical History  Procedure Laterality Date  . Combined hysterectomy abdominal w/ a&p repair / oophorectomy  1960's    for endometriosis  . Cholecystectomy open  2002  . Basal cell carcinoma excision  06/2003    gluteal fold  . Retinal laser procedure  10/2003    left eye  .  Nephrectomy  12/2003    left due to non-function  . Minor breast biopsy  06/2010    right   . Esophageal dilation  10/2012    Schatzki Ring Dr Amedeo Plenty  . Abdominal hysterectomy      Prior to Admission medications   Medication Sig Start Date End Date Taking? Authorizing Provider  acetaminophen (TYLENOL) 650 MG CR tablet Take 650 mg by mouth every 8 (eight) hours as needed for pain.   Yes Historical Provider, MD  allopurinol (ZYLOPRIM) 100 MG tablet take 1 tablet by mouth once daily 10/01/12  Yes Candelaria Celeste, MD  aspirin 81 MG tablet Take 81 mg by mouth daily.   Yes Historical Provider, MD  busPIRone (BUSPAR) 15 MG tablet Take 0.5 tablets (7.5 mg total) by mouth 2 (two) times daily. 07/22/13  Yes Candelaria Celeste, MD  Cetirizine HCl 10 MG CAPS Take 1 capsule (10 mg total) by mouth at bedtime. OTC 09/04/11  Yes Candelaria Celeste, MD  ferrous sulfate 325 (65 FE) MG tablet Take 325 mg by mouth daily with breakfast.   Yes Historical Provider, MD  fluticasone (FLONASE) 50 MCG/ACT nasal spray instill 2 sprays into each nostril every morning 10/15/12  Yes Candelaria Celeste, MD  furosemide (LASIX) 20 MG tablet Take 1 tablet (20 mg total) by mouth daily. 02/25/13 02/25/14 Yes Candelaria Celeste, MD  isosorbide mononitrate (IMDUR) 30  MG 24 hr tablet Take 1 tablet (30 mg total) by mouth daily. 09/22/13  Yes Lupita Dawn, MD  lidocaine (LIDODERM) 5 % Place 1 patch onto the skin daily. Remove & Discard patch within 12 hours or as directed by MD 04/03/13  Yes Candelaria Celeste, MD  metoprolol succinate (TOPROL-XL) 50 MG 24 hr tablet Take 1 tablet (50 mg total) by mouth daily. Take with or immediately following a meal. 09/22/13  Yes Lupita Dawn, MD  NIFEDICAL XL 60 MG 24 hr tablet take 1 tablet by mouth once daily 10/23/13  Yes Lupita Dawn, MD  NITROSTAT 0.4 MG SL tablet PLACE 1 TABLET UNDER THE TONGUE AS NEEDED FOR THE CHEST PAIN, MAY REPEAT IN 5 MINUTES 2 TIMES  BUT IF STILL HAVING PROBLEMS ACCESS EMERGENCY 12/15/13  Yes Lupita Dawn, MD  PROAIR  HFA 108 (90 BASE) MCG/ACT inhaler inhale 2 puffs by mouth four times a day if needed 03/19/13  Yes Katherina Mires, MD  Pyridoxine HCl (VITAMIN B-6) 100 MG tablet Take 100 mg by mouth daily.    Yes Historical Provider, MD  simethicone (MYLICON) 80 MG chewable tablet Chew 80 mg by mouth every 6 (six) hours as needed for flatulence.   Yes Historical Provider, MD  simvastatin (ZOCOR) 20 MG tablet take 1 tablet by mouth once daily 06/17/13  Yes Candelaria Celeste, MD  sodium bicarbonate 650 MG tablet Take 650 mg by mouth 2 (two) times daily. 2 tablets BID   Yes Historical Provider, MD  traMADol (ULTRAM) 50 MG tablet Take 1 tablet (50 mg total) by mouth every 12 (twelve) hours as needed. 11/21/13  Yes Lupita Dawn, MD  glucose blood (BAYER CONTOUR TEST) test strip Test fasting and when symptoms of low blood sugar 03/21/11   Candelaria Celeste, MD  loperamide (IMODIUM) 2 MG capsule Take 2 mg by mouth 4 (four) times daily as needed.      Historical Provider, MD  polyethylene glycol powder (GLYCOLAX/MIRALAX) powder Take 17 g by mouth daily. 11/21/13   Lupita Dawn, MD    Scheduled Meds: . allopurinol  100 mg Oral Daily  . aspirin  81 mg Oral Daily  . busPIRone  7.5 mg Oral BID  . fluticasone  2 spray Each Nare Daily  . furosemide  20 mg Oral Daily  . isosorbide mononitrate  30 mg Oral Daily  . metoprolol succinate  50 mg Oral Daily  . NIFEdipine  60 mg Oral Daily  . pantoprazole  40 mg Oral BID  . pyridOXINE  100 mg Oral Daily  . simvastatin  20 mg Oral q1800  . sodium bicarbonate  650 mg Oral BID   Continuous Infusions:  PRN Meds:.acetaminophen, albuterol, hydrALAZINE, morphine injection, ondansetron (ZOFRAN) IV, simethicone, traMADol  Allergies as of 12/17/2013 - Review Complete 12/17/2013  Allergen Reaction Noted  . Fexofenadine Other (See Comments) 09/03/2012  . Trazodone and nefazodone  08/13/2012  . Ciprofloxacin Other (See Comments) 02/07/2007  . Diphenhydramine hcl Other (See Comments) 02/07/2007  .  Gabapentin Other (See Comments) 02/07/2007  . Procaine hcl  02/07/2007  . Propofol Diarrhea and Nausea And Vomiting 11/12/2012  . Sulfamethoxazole-trimethoprim  06/24/2009  . Codeine Rash 02/07/2007  . Doxycycline hyclate Rash 01/03/2010    Family History  Problem Relation Age of Onset  . Asthma Son   . Cancer Father     brain tumor  . Diabetes Father   . Cancer Mother     liver    History  Social History  . Marital Status: Widowed    Spouse Name: N/A    Number of Children: 1  . Years of Education: college   Occupational History  . Secretary-retired    Social History Main Topics  . Smoking status: Never Smoker   . Smokeless tobacco: Never Used  . Alcohol Use: No  . Drug Use: No  . Sexual Activity: Not on file   Other Topics Concern  . Not on file   Social History Narrative   Divorced x 2   Health Care POA:    Emergency Contact: Son, Marc Morgans (c) [phone]   End of Life Plan:    Who lives with you: self   Any pets: none   Diet: Patient has a varied diet but reports a lack of appetite and not eating very much throughout day.   Exercise: Pt does not have any regular exercise routine.   Seatbelts: Pt reports wearing seatbelt occasionally due to hernia.   Hobbies: tv, pt reports not doing anything                Review of Systems: All negative from at GI standpoint except as stated above in HPI.  Physical Exam: Vital signs: Filed Vitals:   12/17/13 1550  BP: 127/61  Pulse: 74  Temp: 98.2 F (36.8 C)  Resp: 20   Last BM Date: 12/16/13 General:   Elderly, Alert,  Well-developed, well-nourished, pleasant and cooperative in NAD HEENT: anicteric Lungs:  Clear throughout to auscultation.   No wheezes, crackles, or rhonchi. No acute distress. Heart:  Regular rate and rhythm; no murmurs, clicks, rubs,  or gallops. Abdomen: diffusely tender with guarding, soft, nondistended, +BS, black raised spot in epigastric region  Rectal:  Deferred Ext: no edema  GI:   Lab Results:  Recent Labs  12/17/13 0213 12/17/13 0539  WBC 8.8 7.9  HGB 7.8* 7.1*  HCT 23.3* 20.8*  PLT 190 160   BMET  Recent Labs  12/17/13 0213 12/17/13 0539  NA 139 139  K 4.5 4.4  CL 107 109  CO2 16* 14*  GLUCOSE 122* 117*  BUN 50* 48*  CREATININE 3.63* 3.42*  CALCIUM 9.7 9.2   LFT  Recent Labs  12/17/13 0539  PROT 5.9*  ALBUMIN 2.9*  AST 38*  ALT 16  ALKPHOS 79  BILITOT <0.2*   PT/INR  Recent Labs  12/17/13 0315  LABPROT 13.0  INR 1.00     Studies/Results: Dg Chest 2 View  12/17/2013   CLINICAL DATA:  Chest pain  EXAM: CHEST  2 VIEW  COMPARISON:  02/25/2013  FINDINGS: Generous heart size. No acute contour change in the upper mediastinum. Coronary arterial stents. Right diaphragmatic eventration. No edema or infiltrate. No effusion or pneumothorax.  IMPRESSION: No active cardiopulmonary disease.   Electronically Signed   By: Jorje Guild M.D.   On: 12/17/2013 02:33    Impression/Plan: 80 yo with 3 weeks of malodorous black tarry stools (despite it starting at the same time as iron pills which can cause black stools) the malodorous and tarry appearance with her anemia is concerning for a GI bleed and an EGD is needed. Changed to IV PPI Q 12 hours. Agree with transfusion. Hgb now 8.9 post-transfusion from 7.1. Clears. NPO p MN. EGD tomorrow morning by Dr. Amedeo Plenty.    LOS: 0 days   Brecon C.  12/17/2013, 7:38 PM

## 2013-12-17 NOTE — ED Notes (Signed)
Internal medicine at bedside

## 2013-12-17 NOTE — ED Notes (Signed)
Pt. reports mid chest tightness " burping a lot " / left arm pain and exertional dyspnea onset this evening with no nausea or diaphoresis . Pt. Took 2 NTG sl with no relief.

## 2013-12-17 NOTE — H&P (Signed)
[facility] Admission History and Physical Service Pager: [phone]  Patient name: Belinda [name] Medical record number: [id number] Date of birth: May 09, 1927 Age: 80 y.o. Gender: female  Primary Care Provider: Lupita Dawn, MD Consultants: Cardiology Code Status: Full; HCPOA (Son [name])  Chief Complaint: Chest pain & HTN Emergency  Assessment and Plan: Belinda [name] is a 80 y.o. female presenting with chest pain and HTN emergency. PMH is significant for HTN, CKD stage 5, Diverticulosis.   # Chest Pain: MI rule-out - Improved with Nitro at home, but still POA; CXR: No active cardiopulmonary disease; EKG: NSR w/ inverted Twaves in III, V1-3 that is unchanged from previous. BNP: ~ 8000 - likely elevated due to CKD as euvolemic by CXR and physical exam; ECHO 01/2011: EF 60-65%; mild LVH, mild AV regurg. Cardiac stress normal (01/2006) - istat Trop neg; Will Cylce - Tele; Repeat EKG in AM - ASA 325;  - Cardiologist: Dr Irish [facility]) - Will call in AM  # HTN Emergency  - BP 208/80 in ED; IV Labetalol 10mg  given - Restarted Home meds: Metoprolol XL 50 mg qd, Imdur 30 mg qd, Lasix 20 mg qd, Nifedical XL 60 mg qd - Hydralazine 10mg  q4prn to keep BP < 180/110  # Anemia: Acute on chronic (likely due to CKD); Hx of diverticulosis - Denies Hx of bleed - Hgb 7.8 (Admit 1/13); Baseline ~ 9; Endorses ack stools "like the ACE of spades" since taking iron, also tar-like w/ inc odor; MCV wnl. Concerning for GI bleed w/ FOBT (+); PT/INR & PTT: wnl - Type and screen;  - Protonix 40mg  IV BID - Check CMET & CBC in am - Consider GI consult - Consider RBC transfusion if CP continues  # CKD Stage 5 not on HD: Baseline Cr ~ 3.5.  - Cr 3.63 (admit 1/14); BUN/Cr ratio ~ 13 - Nephrologist: Dr Joni Fears w/ [facility]   Chronic Conditions # DM: A1c 5.5 (10/2013): Diet controlled  # HLD: Continue Zocor # Gout: Continue Allopurinol # Anxiety: Buspar  FEN/GI: Heart healthy  diet; SLIV Prophylaxis: SCDs; (due to concern for GI bleed)  Disposition: Admit to tele; Attending Ree Kida; Discharge pending MI rule-out and anemia evaluation  History of Present Illness: Belinda [name] is a 81 y.o. female presenting with substernal chest tightness with associated HA, Nausea, left arm/elbow pain & burping. Tightness resolved with Nitro x 3 at home, however she was concerned about her elevate BP at home (234/88) and was told to go to ED by MD on FM call. Endorses viral URI symptoms (sneezing, nasal congestion x 2-3 wks) and endorses black stools "like the ACE of spades" since taking iron, also tar-like w/ inc odor.  She denies: SOB or wheezing.   Review Of Systems: Per HPI with the following additions:  Otherwise 12 point review of systems was performed and was unremarkable.  Patient Active Problem List   Diagnosis Date Noted  . Insomnia 11/22/2013  . Constipation 10/24/2013  . Cough 09/05/2013  . Dark stools 07/31/2013  . Enteritis 07/31/2013  . Fall 07/01/2013  . Musculoskeletal pain 07/01/2013  . Seborrheic dermatitis, unspecified 04/08/2013  . AC (acromioclavicular) joint arthritis 03/25/2013  . Fatigue 09/25/2012  . Osteopenia 08/26/2012  . Syncope 08/01/2012  . Vocal cord atrophy 09/26/2011  . Depression 08/10/2011  . Diabetic enteropathy 04/07/2011  . ASTHMA, INTERMITTENT 09/25/2010  . MACULAR DEGENERATION, EXUDATIVE 08/05/2010  . AEROPHAGIA 03/10/2010  . VENOUS INSUFFICIENCY 03/04/2010  . VISUAL ACUITY,  DECREASED, RIGHT EYE 12/09/2009  . IBS 02/11/2009  . CKD (chronic kidney disease) stage 5, GFR less than 15 ml/min 10/22/2008  . URINARY INCONTINENCE, URGE 06/15/2008  . Gout 04/28/2008  . ATHEROSCLEROTIC CARDIOVASCULAR DISEASE 01/07/2008  . Anemia in chronic kidney disease(285.21) 10/24/2007  . UNSTEADY GAIT 06/18/2007  . RETINOPATHY, DIABETIC, PROLIFERATIVE 02/23/2007  . PSEUDOGOUT 02/07/2007  . HYPERTENSION 02/07/2007  . HERNIA, SITE NOS W/O  OBSTRUCTION/GANGRENE 02/07/2007  . NEPHROLITHIASIS, HX OF 02/07/2007  . NEUROPATHY, DIABETIC 01/31/2007  . DIABETES MELLITUS, II, COMPLICATIONS [id number]  . HYPERLIPIDEMIA 01/31/2007  . ANXIETY 01/31/2007  . CATARACT 01/31/2007  . HEARING LOSS NOS OR DEAFNESS 01/31/2007  . RHINITIS, ALLERGIC 01/31/2007  . GASTROESOPHAGEAL REFLUX, NO ESOPHAGITIS 01/31/2007  . LUMBAR SPINAL STENOSIS 01/31/2007   Past Medical History: Past Medical History  Diagnosis Date  . Stenosis, spinal, lumbar 05/20/2006    L 3-4, 4-5  MRI  . Bilateral sensorineural hearing loss 10/2006    audiogram  . Diabetes mellitus   . Chronic kidney disease   . Diverticulosis of colon   . H/O endoscopy 11/2006    benign duodenal scar  . Normal cardiac stress test 01/30/2006    Cardiolite EF 54%  . Abnormal barium swallow 12/2009    ?thyroid nodule   Past Surgical History: Past Surgical History  Procedure Laterality Date  . Combined hysterectomy abdominal w/ a&p repair / oophorectomy  1960's    for endometriosis  . Cholecystectomy open  2002  . Basal cell carcinoma excision  06/2003    gluteal fold  . Retinal laser procedure  10/2003    left eye  . Nephrectomy  12/2003    left due to non-function  . Minor breast biopsy  06/2010    right   . Esophageal dilation  10/2012    Schatzki Ring Dr Amedeo Plenty   Social History: History  Substance Use Topics  . Smoking status: Never Smoker   . Smokeless tobacco: Never Used  . Alcohol Use: No   Additional social history:   Please also refer to relevant sections of EMR.  Family History: Family History  Problem Relation Age of Onset  . Asthma Son   . Cancer Father     brain tumor  . Diabetes Father   . Cancer Mother     liver   Allergies and Medications: Allergies  Allergen Reactions  . Fexofenadine Other (See Comments)    confusion  . Trazodone And Nefazodone     Lightheadedness  . Ciprofloxacin Other (See Comments)    REACTION: Headache and stomachache  .  Diphenhydramine Hcl Other (See Comments)    REACTION: hyperactive  . Gabapentin Other (See Comments)    REACTION: leg pain  . Procaine Hcl     REACTION: Rash  . Propofol Diarrhea and Nausea And Vomiting    Had endoscopy at Dr Amedeo Plenty 10/2012  . Sulfamethoxazole-Trimethoprim     REACTION: Nausea  . Codeine Rash    REACTION: Rash  . Doxycycline Hyclate Rash    REACTION: rash?   No current facility-administered medications on file prior to encounter.   Current Outpatient Prescriptions on File Prior to Encounter  Medication Sig Dispense Refill  . acetaminophen (TYLENOL) 650 MG CR tablet Take 650 mg by mouth every 8 (eight) hours as needed for pain.      Marland Kitchen allopurinol (ZYLOPRIM) 100 MG tablet take 1 tablet by mouth once daily  90 tablet  3  . aspirin 81 MG tablet Take 81 mg by mouth  daily.      . busPIRone (BUSPAR) 15 MG tablet Take 0.5 tablets (7.5 mg total) by mouth 2 (two) times daily.  30 tablet  5  . Cetirizine HCl 10 MG CAPS Take 1 capsule (10 mg total) by mouth at bedtime. OTC  90 capsule  3  . fluticasone (FLONASE) 50 MCG/ACT nasal spray instill 2 sprays into each nostril every morning  16 g  11  . furosemide (LASIX) 20 MG tablet Take 1 tablet (20 mg total) by mouth daily.  90 tablet  3  . isosorbide mononitrate (IMDUR) 30 MG 24 hr tablet Take 1 tablet (30 mg total) by mouth daily.  30 tablet  5  . lidocaine (LIDODERM) 5 % Place 1 patch onto the skin daily. Remove & Discard patch within 12 hours or as directed by MD  30 patch  2  . metoprolol succinate (TOPROL-XL) 50 MG 24 hr tablet Take 1 tablet (50 mg total) by mouth daily. Take with or immediately following a meal.  90 tablet  3  . NIFEDICAL XL 60 MG 24 hr tablet take 1 tablet by mouth once daily  90 tablet  1  . NITROSTAT 0.4 MG SL tablet PLACE 1 TABLET UNDER THE TONGUE AS NEEDED FOR THE CHEST PAIN, MAY REPEAT IN 5 MINUTES 2 TIMES  BUT IF STILL HAVING PROBLEMS ACCESS EMERGENCY  25 tablet  4  . PROAIR HFA 108 (90 BASE) MCG/ACT  inhaler inhale 2 puffs by mouth four times a day if needed  18 g  1  . Pyridoxine HCl (VITAMIN B-6) 100 MG tablet Take 100 mg by mouth daily.       . simvastatin (ZOCOR) 20 MG tablet take 1 tablet by mouth once daily  90 tablet  3  . sodium bicarbonate 650 MG tablet Take 650 mg by mouth 2 (two) times daily. 2 tablets BID      . traMADol (ULTRAM) 50 MG tablet Take 1 tablet (50 mg total) by mouth every 12 (twelve) hours as needed.  60 tablet  0  . glucose blood (BAYER CONTOUR TEST) test strip Test fasting and when symptoms of low blood sugar  100 each  11  . loperamide (IMODIUM) 2 MG capsule Take 2 mg by mouth 4 (four) times daily as needed.        . polyethylene glycol powder (GLYCOLAX/MIRALAX) powder Take 17 g by mouth daily.  3350 g  1    Objective: BP 208/80  Pulse 89  Resp 14  SpO2 100% Exam: General: Frail elderly female; NAD HEENT: MMM; PERRL Cardiovascular: RRR no m/r/g Respiratory: CTAB, normal respiratory effort Abdomen: Soft, NT/ND Extremities: WWP; No LE edema  Labs and Imaging: CBC BMET   Recent Labs Lab 12/17/13 0213  WBC 8.8  HGB 7.8*  HCT 23.3*  PLT 190    Recent Labs Lab 12/17/13 0213  NA 139  K 4.5  CL 107  CO2 16*  BUN 50*  CREATININE 3.63*  GLUCOSE 122*  CALCIUM 9.7     BNP (last 3 results)  Recent Labs  12/17/13 0205  PROBNP 7879.0*   CXR 1/14 Generous heart size. No acute contour change in the upper  mediastinum. Coronary arterial stents. Right diaphragmatic  eventration. No edema or infiltrate. No effusion or pneumothorax.  IMPRESSION:  No active cardiopulmonary disease.   Phill Myron, MD 12/17/2013, 3:19 AM PGY-1, [place] Intern pager: [phone], text pages welcome  Teaching Service Addendum. I have seen and evaluated  this pt and agree with Dr. Milagros Reap assessment and plan as is documented on this note.   D. [place] Philippa Sicks, MD Family Medicine  PGY-3

## 2013-12-18 ENCOUNTER — Encounter (HOSPITAL_COMMUNITY)
Admission: EM | Disposition: A | Discharge status: Home / Self Care | Payer: Self-pay | Source: Home / Self Care | Attending: Family Medicine

## 2013-12-18 ENCOUNTER — Encounter (HOSPITAL_COMMUNITY): Payer: Self-pay | Admitting: Gastroenterology

## 2013-12-18 DIAGNOSIS — F411 Generalized anxiety disorder: Secondary | ICD-10-CM

## 2013-12-18 DIAGNOSIS — D62 Acute posthemorrhagic anemia: Principal | ICD-10-CM

## 2013-12-18 DIAGNOSIS — I369 Nonrheumatic tricuspid valve disorder, unspecified: Secondary | ICD-10-CM

## 2013-12-18 DIAGNOSIS — I2510 Atherosclerotic heart disease of native coronary artery without angina pectoris: Secondary | ICD-10-CM

## 2013-12-18 DIAGNOSIS — E1149 Type 2 diabetes mellitus with other diabetic neurological complication: Secondary | ICD-10-CM

## 2013-12-18 DIAGNOSIS — K219 Gastro-esophageal reflux disease without esophagitis: Secondary | ICD-10-CM

## 2013-12-18 DIAGNOSIS — I10 Essential (primary) hypertension: Secondary | ICD-10-CM

## 2013-12-18 DIAGNOSIS — K5900 Constipation, unspecified: Secondary | ICD-10-CM

## 2013-12-18 DIAGNOSIS — R195 Other fecal abnormalities: Secondary | ICD-10-CM

## 2013-12-18 HISTORY — PX: ESOPHAGOGASTRODUODENOSCOPY: SHX5428

## 2013-12-18 LAB — BASIC METABOLIC PANEL
BUN: 54 mg/dL — ABNORMAL HIGH (ref 6–23)
CALCIUM: 9 mg/dL (ref 8.4–10.5)
CO2: 14 mEq/L — ABNORMAL LOW (ref 19–32)
CREATININE: 3.74 mg/dL — AB (ref 0.50–1.10)
Chloride: 109 mEq/L (ref 96–112)
GFR calc non Af Amer: 10 mL/min — ABNORMAL LOW (ref >90)
GFR, EST AFRICAN AMERICAN: 12 mL/min — AB (ref >90)
Glucose, Bld: 100 mg/dL — ABNORMAL HIGH (ref 70–99)
Potassium: 4.8 mEq/L (ref 3.7–5.3)
Sodium: 138 mEq/L (ref 137–147)

## 2013-12-18 LAB — CBC
HCT: 25.6 % — ABNORMAL LOW (ref 36.0–46.0)
Hemoglobin: 8.8 g/dL — ABNORMAL LOW (ref 12.0–15.0)
MCH: 30.8 pg (ref 26.0–34.0)
MCHC: 34.4 g/dL (ref 30.0–36.0)
MCV: 89.5 fL (ref 78.0–100.0)
PLATELETS: 156 10*3/uL (ref 150–400)
RBC: 2.86 MIL/uL — ABNORMAL LOW (ref 3.87–5.11)
RDW: 16.7 % — AB (ref 11.5–15.5)
WBC: 7.5 10*3/uL (ref 4.0–10.5)

## 2013-12-18 SURGERY — EGD (ESOPHAGOGASTRODUODENOSCOPY)
Anesthesia: Moderate Sedation

## 2013-12-18 MED ORDER — SODIUM CHLORIDE 0.9 % IV SOLN
INTRAVENOUS | Status: DC
  Administered 2013-12-18: 10 mL/h via INTRAVENOUS
  Administered 2013-12-19: 08:00:00 via INTRAVENOUS

## 2013-12-18 MED ORDER — PEG 3350-KCL-NA BICARB-NACL 420 G PO SOLR
4000.0000 mL | Freq: Once | ORAL | Status: AC
  Administered 2013-12-18: 4000 mL via ORAL
  Filled 2013-12-18: qty 4000

## 2013-12-18 MED ORDER — MIDAZOLAM HCL 10 MG/2ML IJ SOLN
INTRAMUSCULAR | Status: DC | PRN
  Administered 2013-12-18 (×3): 1 mg via INTRAVENOUS

## 2013-12-18 MED ORDER — FENTANYL CITRATE 0.05 MG/ML IJ SOLN
INTRAMUSCULAR | Status: AC
  Filled 2013-12-18: qty 2

## 2013-12-18 MED ORDER — METOPROLOL SUCCINATE ER 25 MG PO TB24
25.0000 mg | ORAL_TABLET | Freq: Every day | ORAL | Status: DC
  Administered 2013-12-18 – 2013-12-21 (×4): 25 mg via ORAL
  Filled 2013-12-18 (×4): qty 1

## 2013-12-18 MED ORDER — BUTAMBEN-TETRACAINE-BENZOCAINE 2-2-14 % EX AERO
INHALATION_SPRAY | CUTANEOUS | Status: DC | PRN
  Administered 2013-12-18: 1 via TOPICAL

## 2013-12-18 MED ORDER — FENTANYL CITRATE 0.05 MG/ML IJ SOLN
INTRAMUSCULAR | Status: DC | PRN
  Administered 2013-12-18: 25 ug via INTRAVENOUS

## 2013-12-18 MED ORDER — BISACODYL 5 MG PO TBEC
10.0000 mg | DELAYED_RELEASE_TABLET | Freq: Once | ORAL | Status: AC
  Administered 2013-12-18: 10 mg via ORAL
  Filled 2013-12-18: qty 2

## 2013-12-18 MED ORDER — MIDAZOLAM HCL 5 MG/ML IJ SOLN
INTRAMUSCULAR | Status: AC
  Filled 2013-12-18: qty 1

## 2013-12-18 NOTE — Op Note (Signed)
[facility] [place], [id number]   ENDOSCOPY PROCEDURE REPORT  PATIENT: Belinda, [name]  MR#: [id number] BIRTHDATE: 01-06-1927 , 69  yrs. old GENDER: Female ENDOSCOPIST:Dakai Braithwaite Amedeo Plenty, MD REFERRED BY: PROCEDURE DATE:  12/18/2013 PROCEDURE: ASA CLASS: INDICATIONS:  anemia and heme positive stool MEDICATION:    fentanyl 25 mcg, Versed 3 mg TOPICAL ANESTHETIC:    Cetacaine spray  DESCRIPTION OF PROCEDURE:   esophagus: Patent lower esophageal ring otherwise normal  Stomach: Normal  Duodenum: Normal     COMPLICATIONS: None  ENDOSCOPIC IMPRESSION:lower esophageal ring otherwise normal study  RECOMMENDATIONS:will proceed with colonoscopy tomorrow.    _______________________________ Lorrin MaisTeena Irani, MD 12/18/2013 9:05 AM

## 2013-12-18 NOTE — Progress Notes (Addendum)
FMTS Attending Note  I personally saw and evaluated the patient. The plan of care was discussed with the resident team. I agree with the assessment and plan as documented by the resident.   1. Anemia due to suspected GI bleed - EGD negative for source of bleeding, GI planning Colonoscopy tomorrow, hemoglobin stable s/p transfusion, appreciate GI recommendations 2. Chest pain - resolved, ?strain due to anemia, cardiac workup negative thus far 3. HTN/accelerated HTN - resolved, blood pressure controlled 4. CKD 5 - seen by nephrology as outpatient, no plans for HD at this time, consider Epogen as outpatient 5. Chronic medical conditions stable  Dossie Arbour MD

## 2013-12-18 NOTE — Progress Notes (Signed)
[facility] Progress Note  Subjective: Still complaining of belching which is a chronic complaint for her  Objective: Vital signs in last 24 hours: Temp:  [98.1 F (36.7 C)-98.5 F (36.9 C)] 98.1 F (36.7 C) (01/15 0754) Pulse Rate:  [68-81] 81 (01/15 0834) Resp:  [18-22] 18 (01/15 0834) BP: (126-198)/(48-163) 198/163 mmHg (01/15 0834) SpO2:  [98 %-100 %] 98 % (01/15 0834) Weight:  [56.988 kg (125 lb 10.2 oz)] 56.988 kg (125 lb 10.2 oz) (01/15 0527) Weight change:    PE: Abdomen soft  Lab Results: Results for orders placed during the hospital encounter of 12/17/13 (from the past 24 hour(s))  TROPONIN I     Status: None   Collection Time    12/17/13  9:50 AM      Result Value Range   Troponin I <0.30  <0.30 ng/mL  TROPONIN I     Status: None   Collection Time    12/17/13  4:49 PM      Result Value Range   Troponin I <0.30  <0.30 ng/mL  HEMOGLOBIN AND HEMATOCRIT, BLOOD     Status: Abnormal   Collection Time    12/17/13  7:07 PM      Result Value Range   Hemoglobin 8.9 (*) 12.0 - 15.0 g/dL   HCT 25.4 (*) 36.0 - 46.0 %  CBC     Status: Abnormal   Collection Time    12/18/13  5:35 AM      Result Value Range   WBC 7.5  4.0 - 10.5 K/uL   RBC 2.86 (*) 3.87 - 5.11 MIL/uL   Hemoglobin 8.8 (*) 12.0 - 15.0 g/dL   HCT 25.6 (*) 36.0 - 46.0 %   MCV 89.5  78.0 - 100.0 fL   MCH 30.8  26.0 - 34.0 pg   MCHC 34.4  30.0 - 36.0 g/dL   RDW 16.7 (*) 11.5 - 15.5 %   Platelets 156  150 - 400 K/uL  BASIC METABOLIC PANEL     Status: Abnormal   Collection Time    12/18/13  5:35 AM      Result Value Range   Sodium 138  137 - 147 mEq/L   Potassium 4.8  3.7 - 5.3 mEq/L   Chloride 109  96 - 112 mEq/L   CO2 14 (*) 19 - 32 mEq/L   Glucose, Bld 100 (*) 70 - 99 mg/dL   BUN 54 (*) 6 - 23 mg/dL   Creatinine, Ser 3.74 (*) 0.50 - 1.10 mg/dL   Calcium 9.0  8.4 - 10.5 mg/dL   GFR calc non Af Amer 10 (*) >90 mL/min   GFR calc Af Amer 12 (*) >90 mL/min    Studies/Results: Dg Chest  2 View  12/17/2013   CLINICAL DATA:  Chest pain  EXAM: CHEST  2 VIEW  COMPARISON:  02/25/2013  FINDINGS: Generous heart size. No acute contour change in the upper mediastinum. Coronary arterial stents. Right diaphragmatic eventration. No edema or infiltrate. No effusion or pneumothorax.  IMPRESSION: No active cardiopulmonary disease.   Electronically Signed   By: Jorje Guild M.D.   On: 12/17/2013 02:33    EGD showed a patent Schatzki's ring otherwise normal with no source of upper GI bleeding  Assessment: Anemia and heme positive stool no upper source suggested  Plan: Will need colonoscopy. Will pursue tomorrow   Holden Maniscalco C 12/18/2013, 8:55 AM

## 2013-12-18 NOTE — Progress Notes (Signed)
[facility] Admission History and Physical Service Pager: [phone]  Patient name: Belinda [name] Medical record number: [id number] Date of birth: 07/29/1927 Age: 80 y.o. Gender: female  Primary Care Provider: Lupita Dawn, MD Consultants: Cardiology Code Status: Full; HCPOA (Son - [name])  Pt Overview and Major Events to Date:  1/14: iTrop neg, FOBT (+), Hgb 7.8 from baseline 9, Transfused 1 unit RBC, EKG unchanged from previous, GI consult 1/15: EGD normal. Colonoscopy tomorrow. Hgb 8.9.   Assessment and Plan: Belinda [name] is a 80 y.o. female presenting with chest pain and HTN emergency. PMH is significant for HTN, CKD stage 5, Diverticulosis.   # Chest Pain: MI rule-out - Improved with Nitro at home; CXR: No active cardiopulmonary disease; EKG: NSR w/ inverted Twaves in III, V1-3 that is unchanged from previous. BNP: ~ 8000 - likely elevated due to CKD as euvolemic by CXR and physical exam; ECHO 01/2011: EF 60-65%; mild LVH, mild AV regurg. Cardiac stress normal (01/2006) - Trop neg x3 - Tele; Repeat EKG in AM- pending  - ASA 325 - Cardiologist: Dr Irish Lack [facility]) - consider consult if pain returns but ACS ruled out for now  # HTN Emergency  - BP 208/80 in ED; Most recent 147/49. - Restarted Home meds: Metoprolol XL 25 mg qd, Imdur 30 mg qd, Lasix 20 mg qd, Nifedical XL 60 mg qd - Hydralazine 10mg  q4prn to keep BP < 180/110  # Anemia: Acute on chronic (likely due to CKD); Hx of diverticulosis - Denies Hx of bleed - Hgb 7.8 (Admit 1/13); Baseline ~ 9; Endorses black, tar-like stools w/ inc odor; MCV wnl. Concerning for GI bleed w/ FOBT (+); PT/INR & PTT: wnl - Protonix 40mg  IV BID - GI consult: Appreciate recommendations, EGD negative; Colonoscopy tomorrow - Consider another unit RBC transfusion if pt's Hb continues to drop; received 1U yesterday Hb 7.1--->8.9 - Do not want to fluid overload considering pt is CKD Stage 5 and refuses HD. - Consider  ferraheme  # CKD Stage 5 not on HD: Baseline Cr ~ 3.5.  - Cr 3.63 (admit 1/14); BUN/Cr ratio ~ 13 - Does not want HD, we will defer EPO administration etc. to outpt Nephrology  - Nephrologist: Dr Joelyn Oms [facility]/ [facility]   Chronic Conditions # DM: A1c 5.5 (10/2013): Diet controlled  # HLD: Continue Zocor # Gout: Continue Allopurinol # Anxiety: Buspar  FEN/GI: Heart healthy diet; SLIV Prophylaxis: SCDs; (due to concern for GI bleed)  Disposition: Discharge pending anemia evaluation  Subjective: Patient  feels much better since her transfusion and does not have any chest pain. Doesn't like her IV site. Other than that no complaints.  Objective: BP 147/49  Pulse 78  Temp(Src) 97.5 F (36.4 C) (Oral)  Resp 24  Ht 4\' 11"  (1.499 m)  Wt 125 lb 10.2 oz (56.988 kg)  BMI 25.36 kg/m2  SpO2 99% Exam: General: Frail elderly female; NAD HEENT: MMM; PERRL Cardiovascular: RRR no m/r/g Respiratory: CTAB, normal respiratory effort Abdomen: Soft, NT/ND Extremities: WWP; No LE edema  Labs and Imaging: CBC BMET   Recent Labs Lab 12/18/13 0535  WBC 7.5  HGB 8.8*  HCT 25.6*  PLT 156    Recent Labs Lab 12/18/13 0535  NA 138  K 4.8  CL 109  CO2 14*  BUN 54*  CREATININE 3.74*  GLUCOSE 100*  CALCIUM 9.0     BNP (last 3 results)  Recent Labs  12/17/13 0205  PROBNP 7879.0*  CXR 1/14 Generous heart size. No acute contour change in the upper  mediastinum. Coronary arterial stents. Right diaphragmatic  eventration. No edema or infiltrate. No effusion or pneumothorax.  IMPRESSION:  No active cardiopulmonary disease.   Charline Bills, Med Student 12/18/2013, 1:51 PM PGY-1, [place] Intern pager: [phone], text pages welcome  I have seen and examined the patient, and I agree with medical status documentation above. My annotations are in blue.  Physical exam Gen: NAD, alert, cooperative with exam, frail appearing HEENT: NCAT, EOMI, PERRL CV:  RRR, good S1/S2, no murmur Resp: CTABL, no wheezes, non-labored Abd: Soft mild tenderness to palpation of left upper quadrant, no rebound, positive bowel sounds Ext: No edema, warm, 2 DP pulses Neuro: Alert and oriented, No gross deficits   A/P Briefly this is an 80 year old female with past medical history of hypertension, CKD stage 5, and diverticulosis admitted for ACS rule out, hypertensive emergency, and now anemia with concern for GI bleed. Thus far her chest pain workup has been unrevealing and her blood pressures are under better control. She's been given one unit PRBC and her hemoglobin is stable currently. GI is on board and has performed an EGD which was unrevealing for source of bleed. They plan a colonoscopy tomorrow morning.   She currently does not want HD and has established care with a n OP nephrologist, Dr. Joelyn Oms.   We will plan for discharge when her hemoglobin is stable and source of GI bleed is evaluated, provided her clinical course does not change.   Laroy Apple, MD [place] Resident, PGY-2 12/18/2013, 3:05 PM

## 2013-12-18 NOTE — Progress Notes (Signed)
  Echocardiogram 2D Echocardiogram has been performed.  Belinda [name] 12/18/2013, 12:25 PM 

## 2013-12-18 NOTE — Progress Notes (Signed)
SUBJECTIVE:  No chest pain.  No SOB.   PHYSICAL EXAM Filed Vitals:   12/17/13 2006 12/18/13 0527 12/18/13 0754 12/18/13 0834  BP: 136/52 151/48 189/54 198/163  Pulse: 73 77 74 81  Temp: 98.4 F (36.9 C) 98.5 F (36.9 C) 98.1 F (36.7 C)   TempSrc: Oral Oral Oral   Resp: 18 18 18 18   Height:      Weight:  125 lb 10.2 oz (56.988 kg)    SpO2: 98% 100% 100% 98%   General:  No distress Lungs:  Clear Heart:  RRR Abdomen:  Positive bowel sounds, no rebound no guarding Extremities:  No edema  LABS: Lab Results  Component Value Date   TROPONINI <0.30 12/17/2013   Results for orders placed during the hospital encounter of 12/17/13 (from the past 24 hour(s))  TROPONIN I     Status: None   Collection Time    12/17/13  9:50 AM      Result Value Range   Troponin I <0.30  <0.30 ng/mL  TROPONIN I     Status: None   Collection Time    12/17/13  4:49 PM      Result Value Range   Troponin I <0.30  <0.30 ng/mL  HEMOGLOBIN AND HEMATOCRIT, BLOOD     Status: Abnormal   Collection Time    12/17/13  7:07 PM      Result Value Range   Hemoglobin 8.9 (*) 12.0 - 15.0 g/dL   HCT 25.4 (*) 36.0 - 46.0 %  CBC     Status: Abnormal   Collection Time    12/18/13  5:35 AM      Result Value Range   WBC 7.5  4.0 - 10.5 K/uL   RBC 2.86 (*) 3.87 - 5.11 MIL/uL   Hemoglobin 8.8 (*) 12.0 - 15.0 g/dL   HCT 25.6 (*) 36.0 - 46.0 %   MCV 89.5  78.0 - 100.0 fL   MCH 30.8  26.0 - 34.0 pg   MCHC 34.4  30.0 - 36.0 g/dL   RDW 16.7 (*) 11.5 - 15.5 %   Platelets 156  150 - 400 K/uL  BASIC METABOLIC PANEL     Status: Abnormal   Collection Time    12/18/13  5:35 AM      Result Value Range   Sodium 138  137 - 147 mEq/L   Potassium 4.8  3.7 - 5.3 mEq/L   Chloride 109  96 - 112 mEq/L   CO2 14 (*) 19 - 32 mEq/L   Glucose, Bld 100 (*) 70 - 99 mg/dL   BUN 54 (*) 6 - 23 mg/dL   Creatinine, Ser 3.74 (*) 0.50 - 1.10 mg/dL   Calcium 9.0  8.4 - 10.5 mg/dL   GFR calc non Af Amer 10 (*) >90 mL/min   GFR calc Af  Amer 12 (*) >90 mL/min    Intake/Output Summary (Last 24 hours) at 12/18/13 0853 Last data filed at 12/17/13 1500  Gross per 24 hour  Intake  637.5 ml  Output    100 ml  Net  537.5 ml    ASSESSMENT AND PLAN:  CAD/CHEST PAIN:  Enzymes negative.  No further pain.  No further cardiac work up.  Of note she is concerned about taking the beta blocker because of a low heart rate in the past.  I will give her half today.  Her BP is elevated.   GI BLEED:  EGD today normal.  Colonoscopy tomorrow.  Jeneen Rinks [facility] 12/18/2013 8:53 AM

## 2013-12-19 ENCOUNTER — Encounter (HOSPITAL_COMMUNITY)
Admission: EM | Disposition: A | Discharge status: Home / Self Care | Payer: Self-pay | Source: Home / Self Care | Attending: Family Medicine

## 2013-12-19 ENCOUNTER — Inpatient Hospital Stay (HOSPITAL_COMMUNITY): Payer: Medicare Other

## 2013-12-19 ENCOUNTER — Encounter (HOSPITAL_COMMUNITY): Payer: Self-pay | Admitting: Gastroenterology

## 2013-12-19 HISTORY — PX: COLONOSCOPY: SHX5424

## 2013-12-19 LAB — CBC
HCT: 25.4 % — ABNORMAL LOW (ref 36.0–46.0)
HEMOGLOBIN: 8.7 g/dL — AB (ref 12.0–15.0)
MCH: 30.3 pg (ref 26.0–34.0)
MCHC: 34.3 g/dL (ref 30.0–36.0)
MCV: 88.5 fL (ref 78.0–100.0)
PLATELETS: 154 10*3/uL (ref 150–400)
RBC: 2.87 MIL/uL — ABNORMAL LOW (ref 3.87–5.11)
RDW: 16.8 % — ABNORMAL HIGH (ref 11.5–15.5)
WBC: 11.8 10*3/uL — ABNORMAL HIGH (ref 4.0–10.5)

## 2013-12-19 LAB — BASIC METABOLIC PANEL
BUN: 58 mg/dL — ABNORMAL HIGH (ref 6–23)
CALCIUM: 9 mg/dL (ref 8.4–10.5)
CO2: 15 mEq/L — ABNORMAL LOW (ref 19–32)
Chloride: 110 mEq/L (ref 96–112)
Creatinine, Ser: 4.27 mg/dL — ABNORMAL HIGH (ref 0.50–1.10)
GFR calc Af Amer: 10 mL/min — ABNORMAL LOW (ref >90)
GFR calc non Af Amer: 9 mL/min — ABNORMAL LOW (ref >90)
GLUCOSE: 126 mg/dL — AB (ref 70–99)
Potassium: 5.1 mEq/L (ref 3.7–5.3)
SODIUM: 140 meq/L (ref 137–147)

## 2013-12-19 LAB — IRON AND TIBC
IRON: 25 ug/dL — AB (ref 42–135)
Saturation Ratios: 11 % — ABNORMAL LOW (ref 20–55)
TIBC: 221 ug/dL — ABNORMAL LOW (ref 250–470)
UIBC: 196 ug/dL (ref 125–400)

## 2013-12-19 LAB — FERRITIN: FERRITIN: 111 ng/mL (ref 10–291)

## 2013-12-19 LAB — GLUCOSE, CAPILLARY: Glucose-Capillary: 113 mg/dL — ABNORMAL HIGH (ref 70–99)

## 2013-12-19 SURGERY — COLONOSCOPY
Anesthesia: Moderate Sedation

## 2013-12-19 MED ORDER — FENTANYL CITRATE 0.05 MG/ML IJ SOLN
INTRAMUSCULAR | Status: DC | PRN
  Administered 2013-12-19 (×3): 25 ug via INTRAVENOUS

## 2013-12-19 MED ORDER — PANTOPRAZOLE SODIUM 40 MG PO TBEC
40.0000 mg | DELAYED_RELEASE_TABLET | Freq: Every day | ORAL | Status: DC
  Administered 2013-12-19 – 2013-12-21 (×3): 40 mg via ORAL
  Filled 2013-12-19 (×2): qty 1

## 2013-12-19 MED ORDER — FENTANYL CITRATE 0.05 MG/ML IJ SOLN
INTRAMUSCULAR | Status: AC
  Filled 2013-12-19: qty 2

## 2013-12-19 MED ORDER — MIDAZOLAM HCL 5 MG/ML IJ SOLN
INTRAMUSCULAR | Status: AC
  Filled 2013-12-19: qty 2

## 2013-12-19 MED ORDER — SODIUM BICARBONATE 650 MG PO TABS
650.0000 mg | ORAL_TABLET | Freq: Three times a day (TID) | ORAL | Status: DC
  Administered 2013-12-19 – 2013-12-21 (×5): 650 mg via ORAL
  Filled 2013-12-19 (×7): qty 1

## 2013-12-19 MED ORDER — FERUMOXYTOL INJECTION 510 MG/17 ML
1020.0000 mg | Freq: Once | INTRAVENOUS | Status: DC
Start: 2013-12-19 — End: 2013-12-19
  Filled 2013-12-19: qty 34

## 2013-12-19 MED ORDER — MIDAZOLAM HCL 5 MG/5ML IJ SOLN
INTRAMUSCULAR | Status: DC | PRN
  Administered 2013-12-19 (×2): 2 mg via INTRAVENOUS

## 2013-12-19 NOTE — Progress Notes (Signed)
[facility] Admission History and Physical Service Pager: [phone]  Patient name: Belinda [name] Medical record number: [id number] Date of birth: 1927/03/21 Age: 80 y.o. Gender: female  Primary Care Provider: Lupita Dawn, MD Consultants: Cardiology Code Status: Full; HCPOA (Son - [name])  Pt Overview and Major Events to Date:  1/14: iTrop neg, FOBT (+), Hgb 7.8 from baseline 9, Transfused 1 unit RBC, EKG unchanged from previous, GI consult 1/15: EGD normal. Colonoscopy tomorrow. Hgb 8.9. Golytely prep initially unproductive- resolved with soap suds enema. 1/16: WBC 11.8 (from 7.5), ECHO: no change from 2008 w/ LVEF 60-65%, Hypertensive yesterday now resolved.   Assessment and Plan: Belinda [name] is a 80 y.o. female presenting with chest pain and HTN emergency. PMH is significant for HTN, CKD stage 5, Diverticulosis.   # Chest Pain: MI rule-out - Improved with Nitro at home; CXR: No active cardiopulmonary disease; EKG: NSR w/ inverted Twaves in III, V1-3 that is unchanged from previous. BNP: ~ 8000 - likely elevated due to CKD as euvolemic by CXR and physical exam. Cardiac stress normal (01/2006) - Trop neg x3 - Tele; EKG 1/15: ?PAC's - ASA 325mg  - ECHO: unchanged from 2008, LVEF 60-65% - Cardiologist: Dr Irish Lack [facility]) - consider consult if pain returns but ACS ruled out for now  # HTN Emergency  - BP 208/80 in ED; Most recent 133/33 - Restarted Home meds: Metoprolol XL 25 mg qd, Imdur 30 mg qd, Lasix 20 mg qd, Nifedical XL 60 mg qd - Hydralazine 10mg  q4prn to keep BP < 180/110  # Anemia: Acute on chronic (likely due to CKD); Hx of diverticulosis - Denies Hx of bleed - Hgb 7.8 (Admit 1/13); Baseline ~ 9; Endorses black, tar-like stools w/ inc odor; MCV wnl. Concerning for GI bleed w/ FOBT (+); PT/INR & PTT: wnl - Protonix 40mg  IV BID - GI consult: Appreciate recommendations, EGD negative; Colonoscopy today - Consider another unit RBC transfusion if pt's  Hb continues to drop; received 1U yesterday Hb 7.1--->8.9--->8.8 - Do not want to fluid overload considering pt is CKD Stage 5 and refuses HD. - Consider ferraheme  # CKD Stage 5 not on HD: Baseline Cr ~ 3.5.  - Cr 3.63 (admit 1/14); BUN/Cr ratio ~ 13 - Does not want HD, we will defer EPO administration etc. to outpt Nephrology  - Nephrologist: Dr Joelyn Oms w/ [facility]   Chronic Conditions # DM: A1c 5.5 (10/2013): Diet controlled  # HLD: Continue Zocor # Gout: Continue Allopurinol # Anxiety: Buspar  FEN/GI: Heart healthy diet; SLIV Prophylaxis: SCDs; (due to concern for GI bleed)  Disposition: Discharge pending anemia evaluation  Subjective: Patient had a rough night with multiple bowel movements after her soap suds enema. IV site changed and patient has sore arm. Other than that no complaints.  Objective: BP 133/33  Pulse 71  Temp(Src) 98.1 F (36.7 C) (Oral)  Resp 18  Ht 4\' 11"  (1.499 m)  Wt 125 lb 10.2 oz (56.988 kg)  BMI 25.36 kg/m2  SpO2 100% Exam: General: Frail elderly female; NAD HEENT: MMM; PERRL Cardiovascular: RRR no m/r/g Respiratory: CTAB, normal respiratory effort Abdomen: Soft, NT/ND Extremities: WWP; No LE edema  Labs and Imaging: CBC BMET   Recent Labs Lab 12/19/13 0514  WBC 11.8*  HGB 8.7*  HCT 25.4*  PLT 154    Recent Labs Lab 12/19/13 0514  NA 140  K 5.1  CL 110  CO2 15*  BUN 58*  CREATININE 4.27*  GLUCOSE  126*  CALCIUM 9.0     BNP (last 3 results)  Recent Labs  12/17/13 0205  PROBNP 7879.0*   CXR 1/14 Generous heart size. No acute contour change in the upper  mediastinum. Coronary arterial stents. Right diaphragmatic  eventration. No edema or infiltrate. No effusion or pneumothorax.  IMPRESSION:  No active cardiopulmonary disease.  EGD 1/15 Lower esophageal ring otherwise normal study  Charline Bills, Med Student 12/19/2013, 8:30 AM

## 2013-12-19 NOTE — Progress Notes (Signed)
    SUBJECTIVE:  No chest pain.  No SOB.   PHYSICAL EXAM Filed Vitals:   12/18/13 2042 12/19/13 0027 12/19/13 0609 12/19/13 0758  BP: 138/49 128/44 131/44 133/33  Pulse: 70 76 70 71  Temp: 97.2 F (36.2 C) 98.3 F (36.8 C) 98.1 F (36.7 C) 97.3 F (36.3 C)  TempSrc: Oral Oral Oral Oral  Resp: 20 20  18   Height:      Weight:      SpO2: 98% 96% 100% 100%   General:  No distress Lungs:  Clear Heart:  RRR Abdomen:  Positive bowel sounds, no rebound no guarding Extremities:  No edema  LABS: Lab Results  Component Value Date   TROPONINI <0.30 12/17/2013   Results for orders placed during the hospital encounter of 12/17/13 (from the past 24 hour(s))  CBC     Status: Abnormal   Collection Time    12/19/13  5:14 AM      Result Value Range   WBC 11.8 (*) 4.0 - 10.5 K/uL   RBC 2.87 (*) 3.87 - 5.11 MIL/uL   Hemoglobin 8.7 (*) 12.0 - 15.0 g/dL   HCT 25.4 (*) 36.0 - 46.0 %   MCV 88.5  78.0 - 100.0 fL   MCH 30.3  26.0 - 34.0 pg   MCHC 34.3  30.0 - 36.0 g/dL   RDW 16.8 (*) 11.5 - 15.5 %   Platelets 154  150 - 400 K/uL  BASIC METABOLIC PANEL     Status: Abnormal   Collection Time    12/19/13  5:14 AM      Result Value Range   Sodium 140  137 - 147 mEq/L   Potassium 5.1  3.7 - 5.3 mEq/L   Chloride 110  96 - 112 mEq/L   CO2 15 (*) 19 - 32 mEq/L   Glucose, Bld 126 (*) 70 - 99 mg/dL   BUN 58 (*) 6 - 23 mg/dL   Creatinine, Ser 4.27 (*) 0.50 - 1.10 mg/dL   Calcium 9.0  8.4 - 10.5 mg/dL   GFR calc non Af Amer 9 (*) >90 mL/min   GFR calc Af Amer 10 (*) >90 mL/min    Intake/Output Summary (Last 24 hours) at 12/19/13 0811 Last data filed at 12/18/13 2153  Gross per 24 hour  Intake   1540 ml  Output    150 ml  Net   1390 ml    ASSESSMENT AND PLAN:  CAD/CHEST PAIN:  Enzymes negative.  Echo OK.   No further pain.  No further cardiac work up.  Of note she is concerned about taking the beta blocker because of a low heart rate in the past.  I will give her half today.  Her BP is  better.  Continue current meds at discharge.  Call us with further questions.    GI BLEED:  EGD normal.  Colonoscopy incomplete today.  Now awaiting barium study.     Jeneen Rinks [facility] 12/19/2013 8:11 AM

## 2013-12-19 NOTE — Progress Notes (Addendum)
Pt c/o of pain & fullness in her abd. Pt stating it's hard for her to breathe she's so full & she's in so much pain. Pt drunk 5 cups of golytely and still has not had a BM. Pt feels like she has to go & has tried but was unsuccessful. MD on call notified and there was an order for a soap suds enema at [facility]. Pt has had 3 BM since the enema.Pt refusing to drink anymore of the prep. Will continue to monitor the pt. Hoover Brunette, RN

## 2013-12-19 NOTE — Progress Notes (Signed)
[facility] Progress Note  Subjective: Tolerated part of bowel prep yesterday and got enema prior to procedure. Objective: Vital signs in last 24 hours: Temp:  [97.2 F (36.2 C)-98.3 F (36.8 C)] 98.1 F (36.7 C) (01/16 0826) Pulse Rate:  [66-80] 73 (01/16 0920) Resp:  [15-22] 22 (01/16 0920) BP: (126-183)/(33-68) 147/49 mmHg (01/16 0920) SpO2:  [95 %-100 %] 100 % (01/16 0920) FiO2 (%):  [100 %] 100 % (01/16 0826) Weight change:    PE: Unchanged  Lab Results: Results for orders placed during the hospital encounter of 12/17/13 (from the past 24 hour(s))  CBC     Status: Abnormal   Collection Time    12/19/13  5:14 AM      Result Value Range   WBC 11.8 (*) 4.0 - 10.5 K/uL   RBC 2.87 (*) 3.87 - 5.11 MIL/uL   Hemoglobin 8.7 (*) 12.0 - 15.0 g/dL   HCT 25.4 (*) 36.0 - 46.0 %   MCV 88.5  78.0 - 100.0 fL   MCH 30.3  26.0 - 34.0 pg   MCHC 34.3  30.0 - 36.0 g/dL   RDW 16.8 (*) 11.5 - 15.5 %   Platelets 154  150 - 400 K/uL  BASIC METABOLIC PANEL     Status: Abnormal   Collection Time    12/19/13  5:14 AM      Result Value Range   Sodium 140  137 - 147 mEq/L   Potassium 5.1  3.7 - 5.3 mEq/L   Chloride 110  96 - 112 mEq/L   CO2 15 (*) 19 - 32 mEq/L   Glucose, Bld 126 (*) 70 - 99 mg/dL   BUN 58 (*) 6 - 23 mg/dL   Creatinine, Ser 4.27 (*) 0.50 - 1.10 mg/dL   Calcium 9.0  8.4 - 10.5 mg/dL   GFR calc non Af Amer 9 (*) >90 mL/min   GFR calc Af Amer 10 (*) >90 mL/min  GLUCOSE, CAPILLARY     Status: Abnormal   Collection Time    12/19/13  8:23 AM      Result Value Range   Glucose-Capillary 113 (*) 70 - 99 mg/dL    Studies/Results: Colonoscopy: Unable to advance beyond 30-35 cm do to multiple diverticuli sharp angulation  Assessment: Anemia and heme positive stools no upper GI source of bleeding. Colonoscopy limited due to sharp angulation in the sigmoid colon  Plan: We'll go ahead and followup with a barium enema today since she is not had a colonoscopy  approximately 8 years. If this is negative, advance diet and discharge when clinically stable.    OFHQR,FXJO C 12/19/2013, 9:31 AM

## 2013-12-19 NOTE — Progress Notes (Addendum)
FMTS Attending Note  I personally saw and evaluated the patient. The plan of care was discussed with the resident team. I agree with the assessment and plan as documented by the resident.  Patient reports no pain, abdominal pain greatly improved s/p Golytely/enema and multiple bowel movements   1. Anemia due to suspected GI bleed - EGD negative for source of bleeding, Colonoscopy performed today - only able to visualize sigmoid colon which was normal, GI has ordered barium enema for further evaluation of colon, hemoglobin stable s/p transfusion, appreciate GI recommendations, consider Ferraheme if Ferritin level is low 2. Chest pain - resolved, ?strain due to anemia, cardiac workup negative thus far, no further workup planned 3. HTN/accelerated HTN - resolved, blood pressure controlled  4. CKD 5 - seen by nephrology as outpatient, no plans for HD at this time, consider Epogen as outpatient  5. Constipation/diarrhea - suspect component of overflow incontinence, will need bowel regimen at time of discharge 6. Chronic medical conditions stable   Dossie Arbour MD

## 2013-12-19 NOTE — Op Note (Signed)
[facility] [place], [address]   OPERATIVE PROCEDURE REPORT  PATIENT: Belinda, [name]  MR#: [id number] BIRTHDATE: August 10, 1927  GENDER: Female ENDOSCOPIST: Teena Irani, MD ASSISTANT:   Carolynn Comment, technician Gunnar Fusi, RN PROCEDURE DATE: 12/19/2013 PROCEDURE: ASA CLASS: INDICATIONS:anemia and heme positive stool with negative EGD. MEDICATIONS: fentanyl 75 mcg Versed 5 mg  DESCRIPTION OF PROCEDURE:   After the risks benefits and alternatives of the procedure were thoroughly explained, informed consent was obtained.        The Pentax Ped Colon [id number] endoscope was introduced through the anus  and advanced to the sigmoid colon where sharp angulation multiple diverticula precluded advancement beyond that despite multiple position changes. Could not appreciate any mass or stricture but difficulty appeared to be due to sharp angulation which could not be overcome.      , No adverse events experienced.    The quality of the prep was fairly good  in the areas examined      .  The instrument was then slowly withdrawn as the colon was fully examined.   there were multiple diverticula seen in the sigmoid and rectosigmoid.Marland Kitchen        no other abnormalities were noted.      the rectum appeared normal.    The scope was then withdrawn from the patient and the procedure terminated.  COMPLICATIONS: There were no complications.  IMPRESSION:diverticulosis, otherwise normal exam limited to the sigmoid colon, unable to traverse due to sharp angulation and multiple diverticula RECOMMENDATIONS:will obtain barium enema to examine proximal colon.  _______________________________ Lorrin MaisTeena Irani, MD 12/19/2013 9:39 AM

## 2013-12-19 NOTE — Progress Notes (Signed)
Family Medicine Teaching Service Daily Progress Note (R3) Late Entry Service Pager: [phone]  Patient name: Belinda [name] Medical record number: [id number] Date of birth: [birth year] Age: 80 y.o. Gender: female  Primary Care Provider: Lupita Dawn, MD Consultants: Cardiology Code Status: Full; HCPOA (Son - [name])  Pt Overview and Major Events to Date:  1/14: iTrop neg, FOBT (+), Hgb 7.8 from baseline 9, Transfused 1 unit RBC, EKG unchanged from previous, GI consult 1/15: EGD normal. Colonoscopy tomorrow. Hgb 8.9. Golytely prep initially unproductive- resolved with soap suds enema. 1/16: WBC 11.8 (from 7.5), ECHO: no change from 2008 w/ LVEF 60-65%, Hypertensive yesterday now resolved.   Assessment and Plan: Belinda [name] is a 80 y.o. female presenting with chest pain and HTN emergency. PMH is significant for HTN, CKD stage 5, Diverticulosis.   # Chest Pain: ACS Eval with hypertensive urgency  - Likely secondary to symptomatic anemia. - Enzymes negative - Chest Pain free - Cardiology has no plans for further eval - ASA 325mg  - ECHO: unchanged from 2008, LVEF 60-65% - Blood pressure significantly improved,  - Meds as below  # Anemia, colonic mass, diverticulosis: Acute on chronic (likely due to CKD and Lower GI bleed); Hx of diverticulosis - Denies Hx of bleed - EGD Negative - Incomplete colonoscopy, barium enema with 2 concerning spots in the transverse colon.  Pending gastroenterology recommendations. - Protonix daily - Continue to trend hemoglobin, status post 1 unit PRBC Recent Labs Lab 12/17/13 0213 12/17/13 0539 12/17/13 1907 12/18/13 0535 12/19/13 0514  HGB 7.8* 7.1* 8.9* 8.8* 8.7*  - defer IV iron at this point given the recent transfusion - check Retic in AM, ?EPO  # CKD Stage 5 not on HD: Baseline Cr ~ 3.5.  - Cr 3.63 (admit 1/14); BUN/Cr ratio ~ 13 - continue to monitor  # Umbilicolith: The patient as mentioned is bothered her multiple occasions. - Manually  removed today with minimal discomfort and no bleeding. - Ensure daily cleaning of umbilicus with cotton swab.  # Difficult access: Currently patient does not have IV access.  We will defer placement until tomorrow morning when it is decided as to what further intervention is needed.  At that time consider midline insertion.  Chronic Conditions # DM: A1c 5.5 (10/2013): Diet controlled  # HLD: Continue Zocor # Gout: Continue Allopurinol # Anxiety: Buspar  FEN/GI: Heart healthy diet; SLIV Prophylaxis: SCDs; (due to concern for GI bleed)  Disposition: Per gastroenterology's recommendations with new findings on barium enema.  Subjective: Patient reports prior symptoms consistent with overflow constipation.  Update provided to patient, son and granddaughter are present in the room.  Objective: BP 131/44  Pulse 63  Temp(Src) 97.6 F (36.4 C) (Oral)  Resp 18  Ht 4\' 11"  (1.499 m)  Wt 125 lb 10.2 oz (56.988 kg)  BMI 25.36 kg/m2  SpO2 100% Exam: General: Frail elderly female; NAD, lying comfortably in bed. HEENT: MMM, no JVD Cardiovascular: Pulse is regular,  Respiratory: normal respiratory effort Abdomen: Soft, mild diffuse tenderness.  Umbilicolith present but able to be gently removed with traction and twisting.  Total volume removed approximately 3 cm of ceruminous material. Extremities: WWP; No LE edema  Labs and Imaging: CBC BMET   Recent Labs Lab 12/19/13 0514  WBC 11.8*  HGB 8.7*  HCT 25.4*  PLT 154    Recent Labs Lab 12/19/13 0514  NA 140  K 5.1  CL 110  CO2 15*  BUN 58*  CREATININE 4.27*  GLUCOSE  126*  CALCIUM 9.0     BNP (last 3 results)  Recent Labs  12/17/13 0205  PROBNP 7879.0*   CXR 1/14 Generous heart size. No acute contour change in the upper  mediastinum. Coronary arterial stents. Right diaphragmatic  eventration. No edema or infiltrate. No effusion or pneumothorax.  IMPRESSION:  No active cardiopulmonary disease.   EGD 1/15 Lower  esophageal ring otherwise normal study  1/16: Barium enema: Diverticulosis, 2 filling defects in the transverse colon.  Gerda Diss, DO 12/19/2013, 7:17 PM

## 2013-12-20 LAB — BASIC METABOLIC PANEL
BUN: 58 mg/dL — AB (ref 6–23)
BUN: 58 mg/dL — AB (ref 6–23)
CALCIUM: 8.6 mg/dL (ref 8.4–10.5)
CO2: 17 mEq/L — ABNORMAL LOW (ref 19–32)
CO2: 17 mEq/L — ABNORMAL LOW (ref 19–32)
Calcium: 9 mg/dL (ref 8.4–10.5)
Chloride: 112 mEq/L (ref 96–112)
Chloride: 107 mEq/L (ref 96–112)
Creatinine, Ser: 4.85 mg/dL — ABNORMAL HIGH (ref 0.50–1.10)
Creatinine, Ser: 4.89 mg/dL — ABNORMAL HIGH (ref 0.50–1.10)
GFR, EST AFRICAN AMERICAN: 9 mL/min — AB (ref >90)
GFR, EST AFRICAN AMERICAN: 9 mL/min — AB (ref >90)
GFR, EST NON AFRICAN AMERICAN: 7 mL/min — AB (ref >90)
GFR, EST NON AFRICAN AMERICAN: 7 mL/min — AB (ref >90)
Glucose, Bld: 88 mg/dL (ref 70–99)
Glucose, Bld: 126 mg/dL — ABNORMAL HIGH (ref 70–99)
Potassium: 5.2 mEq/L (ref 3.7–5.3)
Potassium: 5.2 mEq/L (ref 3.7–5.3)
Sodium: 142 mEq/L (ref 137–147)
Sodium: 137 mEq/L (ref 137–147)

## 2013-12-20 LAB — CBC
HEMATOCRIT: 24 % — AB (ref 36.0–46.0)
HEMATOCRIT: 23.2 % — AB (ref 36.0–46.0)
Hemoglobin: 8.1 g/dL — ABNORMAL LOW (ref 12.0–15.0)
Hemoglobin: 7.9 g/dL — ABNORMAL LOW (ref 12.0–15.0)
MCH: 30.9 pg (ref 26.0–34.0)
MCH: 30.9 pg (ref 26.0–34.0)
MCHC: 33.8 g/dL (ref 30.0–36.0)
MCHC: 34.1 g/dL (ref 30.0–36.0)
MCV: 91.6 fL (ref 78.0–100.0)
MCV: 90.6 fL (ref 78.0–100.0)
Platelets: 144 10*3/uL — ABNORMAL LOW (ref 150–400)
Platelets: 156 10*3/uL (ref 150–400)
RBC: 2.62 MIL/uL — ABNORMAL LOW (ref 3.87–5.11)
RBC: 2.56 MIL/uL — ABNORMAL LOW (ref 3.87–5.11)
RDW: 17 % — AB (ref 11.5–15.5)
RDW: 16.9 % — ABNORMAL HIGH (ref 11.5–15.5)
WBC: 8.3 10*3/uL (ref 4.0–10.5)
WBC: 8 10*3/uL (ref 4.0–10.5)

## 2013-12-20 LAB — RETICULOCYTES
RBC.: 2.62 MIL/uL — ABNORMAL LOW (ref 3.87–5.11)
Retic Count, Absolute: 44.5 10*3/uL (ref 19.0–186.0)
Retic Ct Pct: 1.7 % (ref 0.4–3.1)

## 2013-12-20 NOTE — Progress Notes (Signed)
Barium enema yesterday showed a couple of sub-centimeter filling defects in the transverse colon which could well be polyps but almost certainly, based on their size, would not reflect any sort of malignancy or source of GI bleeding. There was also evidence of retained stool in the cecum.   She has diverticulosis which might have accounted for her GI bleed.  Hemoglobin has dropped slightly, from 8.7-8.1, overnight. This could reflect equilibration or, possibly, gradual GI tract blood loss and/or poor erythropoiesis.   Recommendations:  1. The patient might be a candidate for IV iron and Epogen, since, with her severe kidney disease, she will almost certainly have impaired erythropoiesis  2. For the same reason, I would recommend consideration of transfusion of an additional unit of packed cells prior to discharge  3. Followup with the patient's primary gastroenterologist, Dr. Teena Irani, after discharge, to monitor Hemoccult status and to verify that no further GI evaluation should be undertaken, taking into account the patient's guarded prognosis with her advanced renal disease, and the absence of overt pathology on her endoscopy, sigmoidoscopy, and barium enema.   4.  I spoke with the family medicine resident regarding these recommendations, and I also spoke with the patient's son on the telephone regarding her barium enema findings, and my recommendations.  4. I will plan to sign off at this time, but please call us if you feel that further input from [place] while she is an inpatient would be helpful.  Cleotis Nipper, M.D. [phone]

## 2013-12-20 NOTE — Progress Notes (Addendum)
FMTS Attending Note   I personally saw and evaluated the patient. The plan of care was discussed with the resident team. I agree with the assessment and plan as documented by the resident.   Patient reports minimal abdominal pain, abdominal pain greatly improved s/p Golytely/enema and multiple bowel movements   1. Anemia due to suspected GI bleed - EGD negative for source of bleeding, Colonoscopy performed today - only able to visualize sigmoid colon which was normal, barium enema showed two possible polyps in the transverse colon, hemoglobin slightly down today to 8.1, awaiting further GI recommendations, recheck hemoglobin this afternoon and transfuse if still low  2. Chest pain - resolved, ?strain due to anemia, cardiac workup negative thus far, no further workup planned  3. HTN/accelerated HTN - resolved, blood pressure controlled  4. CKD 5 - seen by nephrology as outpatient, no plans for HD at this time, consider Epogen as outpatient  5. Constipation/diarrhea - suspect component of overflow incontinence, will need bowel regimen at time of discharge  6. Chronic medical conditions stable   Disposition: patient currently without IV access, RN attempted multiple times yesterday to place peripheral line in RUE, unable to utilize left UE for unclear reasons (patient reports previous attempt at AV graft that failed, was previously told by ?vascular surgery/nephrology that she can not have IV's placed in the LUE, there does NOT appear to be a scar in the LUE to suggest previous attempt at AV graft, no clear documentation of vascular procedure in EPIC), will hold on midline PICC at this time, monitor hemoglobin, if continues to fall will have to reattempt peripheral line vs. Place midline PICC  Dossie Arbour MD

## 2013-12-20 NOTE — Evaluation (Signed)
Physical Therapy Evaluation Patient Details Name: Belinda [name] MRN: [id number] DOB: Apr 16, 1927 Today's Date: 12/20/2013 Time: 2409-7353 PT Time Calculation (min): 17 min  PT Assessment / Plan / Recommendation History of Present Illness  Belinda [name] is a 80 y.o. female presenting with chest pain and HTN emergency. Pt with decreased hemoglobin suspected to be caused by GI bleed, planned for transfusion. No symptoms of decr hgb per nurse. PMH is significant for HTN, CKD stage 5, Diverticulosis.  Clinical Impression  Pt adm due to the above. Presents with limitations indicated below causing pt to be limited with her independence with functional mobility. Pt to benefit from skilled acute PT to address deficits listed below and increase mobility with least resistive device to increase independence and decrease risk of falling. Recommend pt ambulate with cane upon D/C home to improve mobility. Pt would benefit from RW but is refusing at this time.    PT Assessment  Patient needs continued PT services    Follow Up Recommendations  No PT follow up;Other (comment);Supervision - Intermittent (refusing and HHPT )    Does the patient have the potential to tolerate intense rehabilitation      Barriers to Discharge Decreased caregiver support lives alone; needs to be Mod I    Equipment Recommendations  None recommended by PT    Recommendations for Other Services     Frequency Min 3X/week    Precautions / Restrictions Precautions Precautions: Fall Precaution Comments: multiple falls in past; denies any in recent months  Restrictions Weight Bearing Restrictions: No   Pertinent Vitals/Pain VSS.       Mobility  Bed Mobility Overal bed mobility: Modified Independent General bed mobility comments: HOB elevated and relies on handrail Transfers Overall transfer level: Needs assistance Equipment used: None Transfers: Sit to/from Stand Sit to Stand: Supervision General transfer comment:  supervision for safety; pt required incr time but was able to power up to stand independently  Ambulation/Gait Ambulation/Gait assistance: Min guard Ambulation Distance (Feet): 300 Feet Assistive device: None Gait Pattern/deviations: Decreased stride length;Trunk flexed;Narrow base of support;Shuffle Gait velocity: able to increase but slightly unstead with incr speed Gait velocity interpretation: at or above normal speed for age/gender General Gait Details: pt slightly unsteady at times; no LOB noted; min guard to steady and for safety; pt required standing rest break secondary to SOB and fatigue; vitals within normal limits     Exercises Other Exercises Other Exercises: educated on HEP to strengthen LEs; educated on mini squats with UE support and LAQ; pt verbalized and dmeo understanding    PT Diagnosis: Abnormality of gait;Generalized weakness  PT Problem List: Decreased strength;Decreased mobility;Decreased balance;Cardiopulmonary status limiting activity PT Treatment Interventions: DME instruction;Gait training;Functional mobility training;Therapeutic activities;Therapeutic exercise;Balance training;Neuromuscular re-education;Patient/family education     PT Goals(Current goals can be found in the care plan section) Acute Rehab PT Goals Patient Stated Goal: to go home  PT Goal Formulation: With patient Time For Goal Achievement: 01/03/14 Potential to Achieve Goals: Good  Visit Information  Last PT Received On: 12/20/13 Assistance Needed: +1 History of Present Illness: Belinda [name] is a 80 y.o. female presenting with chest pain and HTN emergency. Pt with decreased hemoglobin suspected to be caused by GI bleed, planned for transfusion. No symptoms of decr hgb per nurse. PMH is significant for HTN, CKD stage 5, Diverticulosis.       Prior [place] expects to be discharged to:: Private residence Living Arrangements: Alone Available Help at Discharge:  Family;Available PRN/intermittently;Other (Comment) (As needed) Type of Home: House Home Access: Level entry Home Layout: One level Home Equipment: Cane - single point Additional Comments: son lives on same property as pt; reports he can be available as needed; pt has cane but was not ambulating with any AD  Prior Function Level of Independence: Independent Comments: son drives pt to grocery store and appointments  Communication Communication: No difficulties Dominant Hand: Right    Cognition  Cognition Arousal/Alertness: Awake/alert Behavior During Therapy: WFL for tasks assessed/performed Overall Cognitive Status: Within Functional Limits for tasks assessed    Extremity/Trunk Assessment Upper Extremity Assessment Upper Extremity Assessment: Overall WFL for tasks assessed Lower Extremity Assessment Lower Extremity Assessment: RLE deficits/detail;LLE deficits/detail RLE Deficits / Details: hip 3/5; knee WFl LLE Deficits / Details: hip 3/5; knee WFL  Cervical / Trunk Assessment Cervical / Trunk Assessment: Kyphotic   Balance Balance Overall balance assessment: History of Falls;Needs assistance Sitting-balance support: Feet unsupported;No upper extremity supported Sitting balance-Leahy Scale: Normal Standing balance support: During functional activity;No upper extremity supported Standing balance-Leahy Scale: Fair  End of Session PT - End of Session Equipment Utilized During Treatment: Gait belt Activity Tolerance: Patient tolerated treatment well Patient left: in bed;with call bell/phone within reach Nurse Communication: Mobility status  GP     Gustavus Bryant, [place] 12/20/2013, 5:16 PM

## 2013-12-20 NOTE — Progress Notes (Signed)
Family Medicine Teaching Service Daily Progress Note (R3) Late Entry Service Pager: [phone]  Patient name: Belinda [name] Medical record number: [id number] Date of birth: 06-20-1927 Age: 80 y.o. Gender: female  Primary Care Provider: Lupita Dawn, MD Consultants: Cardiology Code Status: Full; HCPOA (Son - [name])  Pt Overview and Major Events to Date:  1/14: iTrop neg, FOBT (+), Hgb 7.8 from baseline 9, Transfused 1 unit RBC, EKG unchanged from previous, GI consult 1/15: EGD normal. Colonoscopy tomorrow. Hgb 8.9. Golytely prep initially unproductive- resolved with soap suds enema. 1/16: WBC 11.8 (from 7.5), ECHO: no change from 2008 w/ LVEF 60-65%, Hypertensive yesterday now resolved.  Barium Enema + for 2 filling defects and ?cecal mass  Assessment and Plan: Belinda [name] is a 80 y.o. female presenting with chest pain and HTN emergency. PMH is significant for HTN, CKD stage 5, Diverticulosis.   # Chest Pain: ACS Eval with hypertensive urgency  - Likely secondary to symptomatic anemia. - Enzymes negative - Chest Pain free - Cardiology has no plans for further eval - ASA 325mg  - ECHO: unchanged from 2008, LVEF 60-65% - Blood pressure significantly improved,  - Meds as below  # Anemia, colonic mass, diverticulosis: Acute on chronic (likely due to CKD and Lower GI bleed); Hx of diverticulosis - Denies Hx of bleed - EGD Negative - GI to see but recommending no further acute intervention unless further evidence of active bleeding - Protonix daily - Continue to trend hemoglobin, status post 1 unit PRBC  Recent Labs Lab 12/17/13 0213 12/17/13 0539 12/17/13 1907 12/18/13 0535 12/19/13 0514 12/20/13 0425  HGB 7.8* 7.1* 8.9* 8.8* 8.7* 8.1*  - defer IV iron at this point given the recent transfusion - Retic Low. Trending down.  Consider 1 additional PRBC vs out pt follow up  # CKD Stage 5 not on HD: Baseline Cr ~ 3.5.  - Cr 3.63 (admit 1/14); BUN/Cr ratio ~ 13 - continue to  monitor - trending up  # Umbilicolith: The patient has mentioned is bothered her multiple occasions. - Manually removed today with minimal discomfort and no bleeding. - Ensure daily cleaning of umbilicus with cotton swab.  # Difficult access: Currently patient does not have IV access.  We will defer placement until tomorrow morning when it is decided as to what further intervention is needed.  At that time consider midline insertion. - okay to access left ARM as not electing for dialysis and no-stick due to previous vascular mapping.  Does not appear to have had intervention.  Consider IV team with [place] to place peripheral IV in L arm if needed.   Chronic Conditions # DM: A1c 5.5 (10/2013): Diet controlled  # HLD: Continue Zocor # Gout: Continue Allopurinol # Anxiety: Buspar  FEN/GI: Heart healthy diet; SLIV Prophylaxis: SCDs; (due to concern for GI bleed)  Disposition: Given worsening renal function and hgb trending downwards continue to monitor and consider d/c in AM.  PT to see  Subjective: Patient reports overall doing better. Mild diffuse abdominal pain consistent with chronic dicomfort  Objective: BP 147/53  Pulse 68  Temp(Src) 98.1 F (36.7 C) (Oral)  Resp 18  Ht 4\' 11"  (1.499 m)  Wt 125 lb 10.2 oz (56.988 kg)  BMI 25.36 kg/m2  SpO2 97% Exam: General: Frail elderly female; NAD, lying comfortably in bed. HEENT: MMM, no JVD Cardiovascular: Pulse is regular,  Respiratory: normal respiratory effort Abdomen: Soft, mild diffuse tenderness.  Non focal, non-rigid Extremities: WWP; No LE edema  Labs  and Imaging: CBC BMET   Recent Labs Lab 12/20/13 0425  WBC 8.3  HGB 8.1*  HCT 24.0*  PLT 144*    Recent Labs Lab 12/20/13 0425  NA 142  K 5.2  CL 112  CO2 17*  BUN 58*  CREATININE 4.85*  GLUCOSE 88  CALCIUM 9.0      Recent Labs Lab 12/17/13 0205 12/17/13 0216 12/17/13 0455 12/17/13 0950 12/17/13 1649  TROPIPOC  --  0.03  --   --   --   TROPONINI  --    --  <0.30 <0.30 <0.30  PROBNP 7879.0*  --   --   --   --     12/19/2013 11:50  Iron 25 (L)  UIBC 196  TIBC 221 (L)  Saturation Ratios 11 (L)  Ferritin 111    12/20/2013 04:25  Retic Ct Pct 1.7  Retic Count, Manual 44.5    CXR 1/14 Generous heart size. No acute contour change in the upper  mediastinum. Coronary arterial stents. Right diaphragmatic  eventration. No edema or infiltrate. No effusion or pneumothorax.  IMPRESSION:  No active cardiopulmonary disease.   EGD 1/15 Lower esophageal ring otherwise normal study  1/16: Barium enema: Diverticulosis, 2 filling defects in the transverse colon.  Belinda Diss, DO 12/20/2013, 11:05 AM

## 2013-12-20 NOTE — Progress Notes (Signed)
Family Practice Teaching Service Interval Progress Note  Pt has no current IV access and Hgb is downtrending. GI consulted earlier and recommends transfusion of another unit of PRBC. I extensively reviewed pt's medical records regarding the surgical history of her left arm vasculature. She had an AV fistula placed in her left upper arm in 2010 by Dr. Drucie Opitz but developed a steal in the left hand and the fistula was occluded. VVS and nephrology had planned to pursue placement of Gortex graft once pt approached needing dialysis, but pt subsequently decided not to pursue dialysis so this was not done. I called and spoke with vascular surgeon on call, Dr. Oneida Alar, who confirmed that it is acceptable for [place] to use this extremity for IV access since patient will absolutely NOT go on dialysis in the future. Also spoke with [facility] attending Dr. Ree Kida who is also okay with using this extremity for IV access. Called pt's RN to explain acceptability of using LUE for PIV. Order entered to insert PIV in LUE and transfuse one additional unit of blood. Will continue to monitor pt.  Chrisandra Netters, MD Family Medicine PGY-2 Service Pager [phone]

## 2013-12-21 DIAGNOSIS — D649 Anemia, unspecified: Secondary | ICD-10-CM

## 2013-12-21 DIAGNOSIS — Z86010 Personal history of colonic polyps: Secondary | ICD-10-CM

## 2013-12-21 LAB — CBC
HCT: 28.8 % — ABNORMAL LOW (ref 36.0–46.0)
HEMATOCRIT: 28.5 % — AB (ref 36.0–46.0)
Hemoglobin: 9.5 g/dL — ABNORMAL LOW (ref 12.0–15.0)
Hemoglobin: 9.6 g/dL — ABNORMAL LOW (ref 12.0–15.0)
MCH: 29.9 pg (ref 26.0–34.0)
MCH: 29.9 pg (ref 26.0–34.0)
MCHC: 33.3 g/dL (ref 30.0–36.0)
MCHC: 33.3 g/dL (ref 30.0–36.0)
MCV: 89.6 fL (ref 78.0–100.0)
MCV: 89.7 fL (ref 78.0–100.0)
Platelets: 137 10*3/uL — ABNORMAL LOW (ref 150–400)
Platelets: 138 10*3/uL — ABNORMAL LOW (ref 150–400)
RBC: 3.18 MIL/uL — ABNORMAL LOW (ref 3.87–5.11)
RBC: 3.21 MIL/uL — ABNORMAL LOW (ref 3.87–5.11)
RDW: 16.9 % — ABNORMAL HIGH (ref 11.5–15.5)
RDW: 17.2 % — ABNORMAL HIGH (ref 11.5–15.5)
WBC: 8.2 10*3/uL (ref 4.0–10.5)
WBC: 8.8 10*3/uL (ref 4.0–10.5)

## 2013-12-21 LAB — TYPE AND SCREEN
ABO/RH(D): O POS
Antibody Screen: NEGATIVE
UNIT DIVISION: 0
Unit division: 0

## 2013-12-21 LAB — BASIC METABOLIC PANEL
BUN: 60 mg/dL — ABNORMAL HIGH (ref 6–23)
CO2: 16 mEq/L — ABNORMAL LOW (ref 19–32)
Calcium: 8.6 mg/dL (ref 8.4–10.5)
Chloride: 108 mEq/L (ref 96–112)
Creatinine, Ser: 4.94 mg/dL — ABNORMAL HIGH (ref 0.50–1.10)
GFR calc non Af Amer: 7 mL/min — ABNORMAL LOW (ref >90)
GFR, EST AFRICAN AMERICAN: 8 mL/min — AB (ref >90)
Glucose, Bld: 105 mg/dL — ABNORMAL HIGH (ref 70–99)
POTASSIUM: 5.6 meq/L — AB (ref 3.7–5.3)
SODIUM: 138 meq/L (ref 137–147)

## 2013-12-21 MED ORDER — SODIUM POLYSTYRENE SULFONATE 15 GM/60ML PO SUSP
15.0000 g | Freq: Once | ORAL | Status: AC
  Administered 2013-12-21: 15 g via ORAL
  Filled 2013-12-21: qty 60

## 2013-12-21 NOTE — Progress Notes (Signed)
FMTS Attending Note   I personally saw and evaluated the patient. The plan of care was discussed with the resident team. I agree with the assessment and plan as documented by the resident.  Patient reports minimal abdominal pain, abdominal pain greatly improved s/p Golytely/enema and multiple bowel movements, ready to go home today  1. Anemia due to suspected GI bleed - patient received 1 U PRBC's last night, hemoglobin stable this morning  2. Chest pain - resolved, ?strain due to anemia, cardiac workup negative thus far, no further workup planned  3. HTN/accelerated HTN - resolved, blood pressure controlled  4. CKD 5 - seen by nephrology as outpatient, no plans for HD at this time, consider Epogen as outpatient, CR slightly elevated, will monitor as outpatient 5. Constipation/diarrhea - suspect component of overflow incontinence, will need bowel regimen at time of discharge  6. Chronic medical conditions stable 7. Potassium elevated - give one dose of Kayexalate and recheck at appointment in office tomorrow.  Stable for discharge home today.   Dossie Arbour MD

## 2013-12-21 NOTE — Discharge Summary (Signed)
[facility] Discharge Summary  Patient name: Belinda [name] Medical record number: [id number] Date of birth: 07-Jun-1927 Age: 80 y.o. Gender: female Date of Admission: 12/17/2013  Date of Discharge: 12/21/2013 Admitting Physician: Zigmund Gottron, MD  Primary Care Provider: Lupita Dawn, MD Consultants: Cardiology, gastroenterology  Indication for Hospitalization: chest pain, hypertensive emergency  Discharge Diagnoses/Problem List:  Chest pain (resolved) HTN (emergency resolved) GI bleed, likely diverticular Acute on chronic anemia (2/2 GI loss, CKD) CKD stage V, not on HD DM type 2 HLD Hx Gout Hx anxiety Umbilicolith  Disposition: discharge home  Discharge Condition: stable  Discharge Exam: BP 146/68  Pulse 62  Temp(Src) 98.5 F (36.9 C) (Oral)  Resp 18  Ht 4\' 11"  (1.499 m)  Wt 125 lb 10.2 oz (56.988 kg)  BMI 25.36 kg/m2  SpO2 98% General: Frail elderly female, lying in bed, finishing breakfast, in NAD  HEENT: MMM, no JVD  Cardiovascular: RRR, no definite murmur appreciated  Respiratory: normal respiratory effort  Abdomen: Soft, mild diffuse tenderness, improved; no focal tenderness, no guarding  Extremities: warm, well-perfused, no LE edema   Brief Hospital Course: Belinda [name] is a 80 y.o. female who presented initially with chest pain and HTN emergency (managed with labetalol and hydralazine x1, t hen home medications). PMH is significant for HTN, CKD stage 5 (adamantly refuses dialysis), Diverticulosis. Pt was found to have FOBT positive and Hb of 7.8, and was transfused 1 unit 1/14; GI was consulted and EGD was normal. Colonoscopy on 1/16 could not reach past sigmoid colon, so barium enema was performed showing diverticulosis and two filling defects felt likely to reflect polyps; Dr. Cristina Gong felt pt could follow up with Dr. Amedeo Plenty (her primary GI doc) after discharge. Pt received a second transfusion 1/17 with improvement of Hb to  9.5, stable on day of discharge. Of note, pt had an upward trend of her Cr leveling off as of day of discharge, as well as K of 5.6. Pt was given Kayexalate x1 on 1/18 (day of discharge) and pt has outpt f/u scheduled for next-day lab check. Please see also below for details by problem list.  # Chest Pain: ACS Eval with hypertensive urgency  - Likely secondary to symptomatic anemia, improved s/p transfusions and labetalol/hydralazine and restart home HTN meds (Lasix, Imdur, Toprol-XL, Nifedical-XL) - Enzymes negative, now chest pain free >24h - Cardiology has no plans for further eval  - continued ASA 325mg , other meds as above  # Anemia, colonic mass, diverticulosis: Acute on chronic (likely due to CKD and Lower GI bleed); Hx of diverticulosis - Denies hx of bleed, EGD negative  - incomplete colonoscopy and barium enema showed filling defects likely polyps, definite moderate diverticulosis  - managed in-house with Protonix daily; defer to PCP for long-term PPI  - continue PO iron defer IV iron at this point given the recent transfusion   # CKD Stage 5 not on HD: Baseline Cr ~ 3.5.  - K elevated s/p transfusion 1/17, given Kayexalate x 1 on 1/18 - Cr has trended up to 4.94 (3.63 on admit 1/14); BUN/Cr ratio remains ~ 12-13 and GFR remains 8-10, UOP better  - pt has outpt f/u 1/19 and will plan for recheck BMP there   # Umbilicolith: mentioned as a chronic issue per pt, painful / irritating - manually removed 1/17 with minimal discomfort and no bleeding  - recommend daily cleaning of umbilicus with cotton swab.   # Difficult access: left arm previously  restricted due to CKD stage V, but adamantly refuses consideration of HD  - IV team placed peripheral IV 1/17 without incident - primary team felt that it will likely be okay to access either arm in the future, as needed   Chronic Conditions  # DM: A1c 5.5 (10/2013): Diet controlled  # HLD: Continued home Zocor  # Gout: Continued  allopurinol  # Anxiety: at baseline, continued home Buspar   Issues for Follow Up:  1. Elevated Cr, mild hypokalemia 1/18- plan to recheck BMP 1/19 at f/u with Dr. Ree Kida  2. FOBT positive, acute on chronic anemia / ?transverse colon polyps  - consider CBC at f/u with BMP draw, f/u with GI - consider long-term PPI therapy  3. Chest pain - completely resolved, f/u with outpt cardiology as needed (no further work-up currently planned)  4. HTN emergency - resolved, continue home meds, monitor closely for need for adjustment  5. Chronic issues - management per PCP; no other acute issues during hospitalization, as above  Significant Procedures:  1. EGD 1/15 - unremarkable 2. Colonoscopy 1/16 - Sigmoid diverticulosis, exam limited to sigmoid, no frank bleeding 3. Barium enema 1/16 - Limited by pt mobility; moderate diverticulosis, 2 round filling defects in transverse colon consistent with polyps 4. 2D Echo 1/15 - no sig change from 05/24/07; EF [id number], grade 2 diastolic dysfunction, LA dilation, mild pulm arterial systolic pressure elevation (37 mm Hg)  Significant Labs and Imaging:   Recent Labs Lab 12/20/13 1643 12/21/13 0009 12/21/13 0520  WBC 8.0 8.2 8.8  HGB 7.9* 9.5* 9.6*  HCT 23.2* 28.5* 28.8*  PLT 156 137* 138*    Recent Labs Lab 12/17/13 0539 12/18/13 0535 12/19/13 0514 12/20/13 0425 12/20/13 1643 12/21/13 0520  NA 139 138 140 142 137 138  K 4.4 4.8 5.1 5.2 5.2 5.6*  CL 109 109 110 112 107 108  CO2 14* 14* 15* 17* 17* 16*  GLUCOSE 117* 100* 126* 88 126* 105*  BUN 48* 54* 58* 58* 58* 60*  CREATININE 3.42* 3.74* 4.27* 4.85* 4.89* 4.94*  CALCIUM 9.2 9.0 9.0 9.0 8.6 8.6  MG 1.9  --   --   --   --   --   ALKPHOS 79  --   --   --   --   --   AST 38*  --   --   --   --   --   ALT 16  --   --   --   --   --   ALBUMIN 2.9*  --   --   --   --   --     Recent Labs Lab 12/17/13 0315  INR 1.00  APTT 29    Recent Labs Lab 12/17/13 0455 12/17/13 0950  12/17/13 1649  TROPONINI <0.30 <0.30 <0.30   FOBT POSITIVE 1/14 BNP 1/14 7879 (H); note pt is CKD Stage V  Imaging:  CXR 1/14: No acute cardio-pulmonary issues Other imaging / studies as above under procedures  Results/Tests Pending at Time of Discharge: none  Discharge Medications:    Medication List         acetaminophen 650 MG CR tablet  Commonly known as:  TYLENOL  Take 650 mg by mouth every 8 (eight) hours as needed for pain.     allopurinol 100 MG tablet  Commonly known as:  ZYLOPRIM  take 1 tablet by mouth once daily     aspirin 81 MG tablet  Take 81 mg by  mouth daily.     busPIRone 15 MG tablet  Commonly known as:  BUSPAR  Take 0.5 tablets (7.5 mg total) by mouth 2 (two) times daily.     Cetirizine HCl 10 MG Caps  Take 1 capsule (10 mg total) by mouth at bedtime. OTC     ferrous sulfate 325 (65 FE) MG tablet  Take 325 mg by mouth daily with breakfast.     fluticasone 50 MCG/ACT nasal spray  Commonly known as:  FLONASE  instill 2 sprays into each nostril every morning     furosemide 20 MG tablet  Commonly known as:  LASIX  Take 1 tablet (20 mg total) by mouth daily.     glucose blood test strip  Commonly known as:  BAYER CONTOUR TEST  Test fasting and when symptoms of low blood sugar     isosorbide mononitrate 30 MG 24 hr tablet  Commonly known as:  IMDUR  Take 1 tablet (30 mg total) by mouth daily.     lidocaine 5 %  Commonly known as:  LIDODERM  Place 1 patch onto the skin daily. Remove & Discard patch within 12 hours or as directed by MD     loperamide 2 MG capsule  Commonly known as:  IMODIUM  Take 2 mg by mouth 4 (four) times daily as needed.     metoprolol succinate 50 MG 24 hr tablet  Commonly known as:  TOPROL-XL  Take 1 tablet (50 mg total) by mouth daily. Take with or immediately following a meal.     NIFEDICAL XL 60 MG 24 hr tablet  Generic drug:  NIFEdipine  take 1 tablet by mouth once daily     NITROSTAT 0.4 MG SL tablet   Generic drug:  nitroGLYCERIN  PLACE 1 TABLET UNDER THE TONGUE AS NEEDED FOR THE CHEST PAIN, MAY REPEAT IN 5 MINUTES 2 TIMES  BUT IF STILL HAVING PROBLEMS ACCESS EMERGENCY     polyethylene glycol powder powder  Commonly known as:  GLYCOLAX/MIRALAX  Take 17 g by mouth daily.     PROAIR HFA 108 (90 BASE) MCG/ACT inhaler  Generic drug:  albuterol  inhale 2 puffs by mouth four times a day if needed     pyridOXINE 100 MG tablet  Commonly known as:  VITAMIN B-6  Take 100 mg by mouth daily.     simethicone 80 MG chewable tablet  Commonly known as:  MYLICON  Chew 80 mg by mouth every 6 (six) hours as needed for flatulence.     simvastatin 20 MG tablet  Commonly known as:  ZOCOR  take 1 tablet by mouth once daily     sodium bicarbonate 650 MG tablet  Take 650 mg by mouth 2 (two) times daily. 2 tablets BID     traMADol 50 MG tablet  Commonly known as:  ULTRAM  Take 1 tablet (50 mg total) by mouth every 12 (twelve) hours as needed.        Discharge Instructions: Please refer to Patient Instructions section of EMR for full details.  Patient was counseled important signs and symptoms that should prompt return to medical care, changes in medications, dietary instructions, activity restrictions, and follow up appointments.   Follow-Up Appointments:     Follow-up Information   Follow up with Lupita Dawn, MD On 12/22/2013. (Appointment at 11 AM)    Specialty:  Family Medicine   Contact information:   [place] [address] [phone]  Follow up with Rexene Agent, MD.   Specialty:  Nephrology   Contact information:   [place] [address] [phone]       Follow up with Missy Sabins, MD.   Specialty:  Gastroenterology   Contact information:   [address], [place] [address] [phone]       Emmaline Kluver, MD 12/21/2013, 10:01 AM PGY-2, [facility]

## 2013-12-21 NOTE — Discharge Instructions (Signed)
You were admitted for chest pain, but we found that your blood level had dropped and we're concerned about blood in your stool. We think you are doing much better and you've done well getting blood transfusions. You need to follow up tomorrow with Dr. Ree Kida, and he will recheck some labs. Your kidney function is a little worse than normal, but we need to make sure it's not continuing to get worse.  Dr. Ree Kida might want to start you on a medicine for reflux / heartburn (we gave you one here in the hospital). This can help protect your stomach and intestines from bleeding.  You also need to make appointments to see your kidney doctor (Dr. Joelyn Oms) and your GI doctor (Dr. Amedeo Plenty). You can make these appointments after you go home, in the next couple of weeks.  Please feel free to call with any questions or concerns at any time, at [phone].

## 2013-12-21 NOTE — Progress Notes (Signed)
Family Medicine Teaching Service Daily Progress Note (R3) Late Entry Service Pager: [phone]  Patient name: CARIANN KINNAMON Medical record number: [id number] Date of birth: [birth year] Age: 80 y.o. Gender: female  Primary Care Provider: Lupita Dawn, MD Consultants: Cardiology Code Status: Full; HCPOA (Son - [name])  Pt Overview and Major Events to Date:  1/14: iTrop neg, FOBT (+), Hgb 7.8 from baseline 9, Transfused 1 unit RBC, EKG unchanged from previous, GI consult 1/15: EGD normal. Colonoscopy tomorrow. Hgb 8.9. Golytely prep initially unproductive- resolved with soap suds enema. 1/16: WBC 11.8 (from 7.5), ECHO: no change from 2008 w/ LVEF 60-65%, Hypertensive yesterday now resolved.  Barium Enema + for 2 filling defects and ?cecal mass 1/17: Hb 7.9, IV access in previously-restricted left arm for 1 u PRB --> Hb 9.5  Assessment and Plan: KORRIE HOFBAUER is a 80 y.o. female presenting with chest pain and HTN emergency. PMH is significant for HTN, CKD stage 5, Diverticulosis.   # Chest Pain: ACS Eval with hypertensive urgency  - Likely secondary to symptomatic anemia, improved s/p transfusions - Enzymes negative, now chest pain free - Cardiology has no plans for further eval - continue ASA 325mg , other meds as below  # Anemia, colonic mass, diverticulosis: Acute on chronic (likely due to CKD and Lower GI bleed); Hx of diverticulosis - Denies Hx of bleed, EGD Negative - incomplete colonoscopy and barium enema shows filling defects likely polyps, definite moderate diverticulosis - Continue protonix daily; defer to PCP for long-term PPI - defer IV iron at this point given the recent transfusion  # CKD Stage 5 not on HD: Baseline Cr ~ 3.5.  - Cr has trended up to 4.94 (3.63 on admit 1/14); BUN/Cr ratio remains ~ 12-13, UOP better - continue to monitor; pt has outpt f/u tomorrow and will plan for recheck BMP there  # Umbilicolith: The patient has mentioned is bothered her multiple occasions. -  Manually removed 1/17 with minimal discomfort and no bleeding. - recommend daily cleaning of umbilicus with cotton swab.  # Difficult access: left arm previously restricted due to CKD stage V, but adamantly refuses consideration of HD - IV team placed peripheral IV 1/17, functioning well - feel okay to access either arm in the future, as needed  Chronic Conditions # DM: A1c 5.5 (10/2013): Diet controlled  # HLD: Continue Zocor # Gout: Continue Allopurinol # Anxiety: Home Buspar  FEN/GI: Heart healthy diet; SLIV Prophylaxis: SCDs; (due to concern for GI bleed)  Disposition: management as above, likely DC home today with close outpt f/u; refusing rolling walker / home health PT  Subjective: Pt without active complaints this morning, feels comfortable with the idea of going home with close outpt f/u. Abdominal pain is much reduced, no nausea this morning, appetite is better.  Objective: BP 153/45  Pulse 70  Temp(Src) 98.5 F (36.9 C) (Oral)  Resp 18  Ht 4\' 11"  (1.499 m)  Wt 125 lb 10.2 oz (56.988 kg)  BMI 25.36 kg/m2  SpO2 98% Exam: General: Frail elderly female, lying in bed, finishing breakfast, in NAD HEENT: MMM, no JVD Cardiovascular: RRR, no definite murmur appreciated Respiratory: normal respiratory effort Abdomen: Soft, mild diffuse tenderness, improved; no focal tenderness, no guarding Extremities: warm, well-perfused, no LE edema  Labs and Imaging: CBC BMET   Recent Labs Lab 12/21/13 0520  WBC 8.8  HGB 9.6*  HCT 28.8*  PLT 138*    Recent Labs Lab 12/21/13 0520  NA 138  K 5.6*  CL 108  CO2 16*  BUN 60*  CREATININE 4.94*  GLUCOSE 105*  CALCIUM 8.6      Recent Labs Lab 12/17/13 0205 12/17/13 0216 12/17/13 0455 12/17/13 0950 12/17/13 1649  TROPIPOC  --  0.03  --   --   --   TROPONINI  --   --  <0.30 <0.30 <0.30  PROBNP 7879.0*  --   --   --   --     12/19/2013 11:50  Iron 25 (L)  UIBC 196  TIBC 221 (L)  Saturation Ratios 11 (L)  Ferritin  111    12/20/2013 04:25  Retic Ct Pct 1.7  Retic Count, Manual 44.5    CXR 1/14 Generous heart size. No acute contour change in the upper  mediastinum. Coronary arterial stents. Right diaphragmatic  eventration. No edema or infiltrate. No effusion or pneumothorax.  IMPRESSION:  No active cardiopulmonary disease.   EGD 1/15 Lower esophageal ring otherwise normal study  1/16: Barium enema: Diverticulosis, 2 filling defects in the transverse colon.  Emmaline Kluver, MD PGY-2, [facility] 12/21/2013, 9:21 AM FPTS Service pager: [phone] (text pages welcome through [facility])

## 2013-12-21 NOTE — Discharge Summary (Signed)
I agree with the discharge summary as documented.   Deborra Phegley MD  

## 2013-12-22 ENCOUNTER — Encounter (HOSPITAL_COMMUNITY): Payer: Self-pay | Admitting: Gastroenterology

## 2013-12-22 ENCOUNTER — Ambulatory Visit (INDEPENDENT_AMBULATORY_CARE_PROVIDER_SITE_OTHER): Payer: Medicare Other | Admitting: Family Medicine

## 2013-12-22 VITALS — BP 138/60 | HR 60 | Temp 98.6°F | Ht 59.0 in

## 2013-12-22 DIAGNOSIS — K922 Gastrointestinal hemorrhage, unspecified: Secondary | ICD-10-CM

## 2013-12-22 DIAGNOSIS — D649 Anemia, unspecified: Secondary | ICD-10-CM

## 2013-12-22 DIAGNOSIS — I10 Essential (primary) hypertension: Secondary | ICD-10-CM

## 2013-12-22 DIAGNOSIS — M25569 Pain in unspecified knee: Secondary | ICD-10-CM

## 2013-12-22 DIAGNOSIS — I2510 Atherosclerotic heart disease of native coronary artery without angina pectoris: Secondary | ICD-10-CM

## 2013-12-22 DIAGNOSIS — M25561 Pain in right knee: Secondary | ICD-10-CM

## 2013-12-22 DIAGNOSIS — E875 Hyperkalemia: Secondary | ICD-10-CM | POA: Insufficient documentation

## 2013-12-22 DIAGNOSIS — K5900 Constipation, unspecified: Secondary | ICD-10-CM

## 2013-12-22 DIAGNOSIS — M109 Gout, unspecified: Secondary | ICD-10-CM

## 2013-12-22 LAB — CBC WITH DIFFERENTIAL/PLATELET
BASOS ABS: 0 10*3/uL (ref 0.0–0.1)
Basophils Relative: 0 % (ref 0–1)
Eosinophils Absolute: 1.3 10*3/uL — ABNORMAL HIGH (ref 0.0–0.7)
Eosinophils Relative: 15 % — ABNORMAL HIGH (ref 0–5)
HCT: 32.9 % — ABNORMAL LOW (ref 36.0–46.0)
Hemoglobin: 10.8 g/dL — ABNORMAL LOW (ref 12.0–15.0)
LYMPHS ABS: 1.3 10*3/uL (ref 0.7–4.0)
LYMPHS PCT: 15 % (ref 12–46)
MCH: 29.9 pg (ref 26.0–34.0)
MCHC: 32.8 g/dL (ref 30.0–36.0)
MCV: 91.1 fL (ref 78.0–100.0)
Monocytes Absolute: 0.9 10*3/uL (ref 0.1–1.0)
Monocytes Relative: 10 % (ref 3–12)
NEUTROS ABS: 5.2 10*3/uL (ref 1.7–7.7)
NEUTROS PCT: 60 % (ref 43–77)
PLATELETS: 176 10*3/uL (ref 150–400)
RBC: 3.61 MIL/uL — ABNORMAL LOW (ref 3.87–5.11)
RDW: 16.8 % — AB (ref 11.5–15.5)
WBC: 8.6 10*3/uL (ref 4.0–10.5)

## 2013-12-22 LAB — BASIC METABOLIC PANEL
BUN: 63 mg/dL — AB (ref 6–23)
CALCIUM: 8.7 mg/dL (ref 8.4–10.5)
CO2: 18 mEq/L — ABNORMAL LOW (ref 19–32)
Chloride: 107 mEq/L (ref 96–112)
Creat: 4.83 mg/dL — ABNORMAL HIGH (ref 0.50–1.10)
Glucose, Bld: 172 mg/dL — ABNORMAL HIGH (ref 70–99)
POTASSIUM: 4.6 meq/L (ref 3.5–5.3)
Sodium: 137 mEq/L (ref 135–145)

## 2013-12-22 MED ORDER — METOPROLOL SUCCINATE ER 25 MG PO TB24: 25.0000 mg | ORAL_TABLET | Freq: Every day | ORAL | Status: DC

## 2013-12-22 MED ORDER — FERROUS SULFATE 325 (65 FE) MG PO TABS: 325.0000 mg | ORAL_TABLET | Freq: Two times a day (BID) | ORAL | Status: DC

## 2013-12-22 MED ORDER — NITROGLYCERIN 0.4 MG SL SUBL: 0.4000 mg | SUBLINGUAL_TABLET | SUBLINGUAL | Status: DC | PRN

## 2013-12-22 MED ORDER — METHYLPREDNISOLONE ACETATE 40 MG/ML IJ SUSP
40.0000 mg | Freq: Once | INTRAMUSCULAR | Status: AC
  Administered 2013-12-22: 40 mg via INTRA_ARTICULAR

## 2013-12-22 NOTE — Assessment & Plan Note (Signed)
Patient has acute flare of gout of the right knee. Unable to treat with NSAIDs or Colchicine due to CKD 5. Hesitant to use by mouth steroids due to recent GI bleed. -Local injection of 40 mg dexamethasone performed of the right knee

## 2013-12-22 NOTE — Addendum Note (Signed)
Addended by: Lupita Dawn on: 12/22/2013 02:37 PM   Modules accepted: Orders

## 2013-12-22 NOTE — Assessment & Plan Note (Signed)
Cardiac workup negative during hospitalization. No further complaints of chest pain. -Patient followup with cardiology as an outpatient

## 2013-12-22 NOTE — Progress Notes (Signed)
Subjective:    Patient ID: Belinda [name], female    DOB: [birth year], 80 y.o.   MRN: [id number]  HPI 80 year old female presents for hospital followup.  Patient was admitted to [facility] family medicine teaching service on 12/17/2013 with chest pain and accelerated hypertension. Patient underwent a cardiac workup including cardiac enzymes and multiple EKGs which were negative for ischemia. Cardiology was consulted and recommended no further ischemic workup. Her chest pain resolved prior to discharge from the hospital. Her accelerated hypertension was treated and blood pressures were normal at time of discharge.  The patient was also found to be anemic, a fecal occult blood test was performed and found to be positive. Gastroenterology was consulted and performed an EGD which was within normal limits, GI subsequently performed a colonoscopy and could not proceed past the sigmoid colon due to severe diverticulosis and severe angulation of the colon. The sigmoid colon was without lesion. The patient subsequently underwent barium enema which identified 2 filling defects likely to reflect polyps in the transverse colon as well as severe diverticulosis. The patient did receive 2 separate blood effusions during her stay and hemoglobin was stable prior to discharge. Patient was counseled to followup with her primary gastroenterologist Dr. Amedeo Plenty at time of discharge.  Patient was also found to be hyperkalemic prior to time of discharge. This is likely related to her multiple blood transfusions. Patient also has a history of CKD 5 and is not on hemodialysis. Her kidney disease is thought to be contributing to her anemia.  The patient was seen and evaluated with her son today. She states that her abdominal pain due to chronic constipation is under good control today. She reports no frank blood in her stool. Patient does report increasing tenderness of her right knee. She does have a history of gout. History states  that the pain in any is similar to previous gout flares. She is having difficulty with ambulation secondary to the pain and is requiring the use of a wheelchair.  Social-reviewed and updated in Epic   Review of Systems  Constitutional: Negative for fever, chills and fatigue.  Respiratory: Negative for apnea, chest tightness and shortness of breath.   Cardiovascular: Negative for chest pain.  Gastrointestinal: Positive for constipation. Negative for nausea, vomiting, abdominal pain, abdominal distention and rectal pain.  Musculoskeletal: Positive for arthralgias and joint swelling.  Skin: Negative for rash.       Objective:   Physical Exam  Vitals: Reviewed General: Pleasant Caucasian female, no acute distress, accompanied by her son HEENT: Normocephalic, pupils 2 mm bilaterally, extraocular movements are intact, moist mucous membranes, neck was supple Cardiac: Regular rate and rhythm, S1 and S2 present, no murmurs, no heaves or thrills Respiratory: Clear to auscultation bilaterally, normal effort Abdomen: Soft, nontender, bowel sounds present Extremities: Trace bilateral edema, 2+ radial pulses bilaterally Musculoskeletal: Right knee diffusely tender, mild warmth, no erythema present, pain with flexion and extension  Reviewed lab work, imaging, and documentation from recent hospital stay.  Procedure note: The risks and benefits of a steroid injection into the right knee were discussed with the patient, written consent was obtained, the right knee was prepped in a sterile fashion using Betadine and alcohol swabs, using a 5 cc syringe 3 mL of 1% lidocaine without epinephrine and 40 mg of dexamethasone were injected into the joint space. The patient tolerated the procedure well. No bleeding was noted. Injection area was covered with a bandage. Post injection care was discussed with the patient.  Assessment & Plan:  Please see problem specific assessment and plan.

## 2013-12-22 NOTE — Assessment & Plan Note (Signed)
Anemia secondary to GI bleed -Check hemoglobin today -continue ferrous sulfate twice daily

## 2013-12-22 NOTE — Assessment & Plan Note (Signed)
Patient to followup with GI physician Dr. Amedeo Plenty. Will check hemoglobin today to monitor for stability.

## 2013-12-22 NOTE — Assessment & Plan Note (Signed)
Controlled after cleanout for colonoscopy during hospital stay. -Continue daily MiraLax

## 2013-12-22 NOTE — Assessment & Plan Note (Signed)
Patient mildly hyperkalemic to 5.6 at time of discharge on 12/21/2013. -Check BMP

## 2013-12-22 NOTE — Patient Instructions (Signed)
Constipation - take Miralax daily  Anemia/Stomach Bleeding - take Iron Pills twice daily, recheck blood levels today  High Potassium - check today  Gout Flair - steroid injection performed today

## 2013-12-22 NOTE — Assessment & Plan Note (Signed)
Blood pressure control today on metoprolol succinate 25 mg daily, Imdur 30 mg daily, and Nifedical 60 mg daily. -No change to discharge medication regimen made today

## 2013-12-23 ENCOUNTER — Telehealth: Payer: Self-pay | Admitting: Family Medicine

## 2013-12-23 NOTE — Telephone Encounter (Signed)
Informed patient of improving Hemoglobin and Potassium.  Patient had right knee injection for gout yesterday and pain is improved, now has left knee pain, told patient to make appointment for knee injection if pain worsens.

## 2013-12-26 ENCOUNTER — Encounter: Payer: Self-pay | Admitting: Family Medicine

## 2013-12-26 ENCOUNTER — Ambulatory Visit (INDEPENDENT_AMBULATORY_CARE_PROVIDER_SITE_OTHER): Payer: Medicare Other | Admitting: Family Medicine

## 2013-12-26 VITALS — BP 200/80 | HR 87 | Temp 98.3°F | Ht 59.0 in | Wt 134.0 lb

## 2013-12-26 DIAGNOSIS — M109 Gout, unspecified: Secondary | ICD-10-CM

## 2013-12-26 MED ORDER — METHYLPREDNISOLONE ACETATE 80 MG/ML IJ SUSP
80.0000 mg | Freq: Once | INTRAMUSCULAR | Status: AC
  Administered 2013-12-26: 80 mg via INTRA_ARTICULAR

## 2013-12-26 NOTE — Patient Instructions (Signed)
Knee Injection Joint injections are shots. Your caregiver will place a needle into your knee joint. The needle is used to put medicine into the joint. These shots can be used to help treat different painful knee conditions such as osteoarthritis, bursitis, local flare-ups of rheumatoid arthritis, and pseudogout. Anti-inflammatory medicines such as corticosteroids and anesthetics are the most common medicines used for joint and soft tissue injections.  PROCEDURE  The skin over the kneecap will be cleaned with an antiseptic solution.  Your caregiver will inject a small amount of a local anesthetic (a medicine like Novocaine) just under the skin in the area that was cleaned.  After the area becomes numb, a second injection is done. This second injection usually includes an anesthetic and an anti-inflammatory medicine called a steroid or cortisone. The needle is carefully placed in between the kneecap and the knee, and the medicine is injected into the joint space.  After the injection is done, the needle is removed. Your caregiver may place a bandage over the injection site. The whole procedure takes no more than a couple of minutes. BEFORE THE PROCEDURE  Wash all of the skin around the entire knee area. Try to remove any loose, scaling skin. There is no other specific preparation necessary unless advised otherwise by your caregiver. LET YOUR CAREGIVER KNOW ABOUT:   Allergies.  Medications taken including herbs, eye drops, over the counter medications, and creams.  Use of steroids (by mouth or creams).  Possible pregnancy, if applicable.  Previous problems with anesthetics or Novocaine.  History of blood clots (thrombophlebitis).  History of bleeding or blood problems.  Previous surgery.  Other health problems. RISKS AND COMPLICATIONS Side effects from cortisone shots are rare. They include:   Slight bruising of the skin.  Shrinkage of the normal fatty tissue under the skin where  the shot was given.  Increase in pain after the shot.  Infection.  Weakening of tendons or tendon rupture.  Allergic reaction to the medicine.  Diabetics may have a temporary increase in their blood sugar after a shot.  Cortisone can temporarily weaken the immune system. While receiving these shots, you should not get certain vaccines. Also, avoid contact with anyone who has chickenpox or measles. Especially if you have never had these diseases or have not been previously immunized. Your immune system may not be strong enough to fight off the infection while the cortisone is in your system. AFTER THE PROCEDURE   You can go home after the procedure.  You may need to put ice on the joint 15-20 minutes every 3 or 4 hours until the pain goes away.  You may need to put an elastic bandage on the joint. HOME CARE INSTRUCTIONS   Only take over-the-counter or prescription medicines for pain, discomfort, or fever as directed by your caregiver.  You should avoid stressing the joint. Unless advised otherwise, avoid activities that put a lot of pressure on a knee joint, such as:  Jogging.  Bicycling.  Recreational climbing.  Hiking.  Laying down and elevating the leg/knee above the level of your heart can help to minimize swelling. SEEK MEDICAL CARE IF:   You have repeated or worsening swelling.  There is drainage from the puncture area.  You develop red streaking that extends above or below the site where the needle was inserted. SEEK IMMEDIATE MEDICAL CARE IF:   You develop a fever.  You have pain that gets worse even though you are taking pain medicine.  The area is   red and warm, and you have trouble moving the joint. MAKE SURE YOU:   Understand these instructions.  Will watch your condition.  Will get help right away if you are not doing well or get worse. Document Released: 02/11/2007 Document Revised: 02/12/2012 Document Reviewed: 11/08/2007 [employer] Patient  Information 2014 [employer].  

## 2013-12-26 NOTE — Addendum Note (Signed)
Addended by: Christen Bame D on: 12/26/2013 12:10 PM   Modules accepted: Orders

## 2013-12-26 NOTE — Progress Notes (Signed)
   Subjective:    Patient ID: Belinda [name], female    DOB: [birth year], 80 y.o.   MRN: [id number]  Knee Pain    Here with acute gouty flare during hospital f/u on 12/22/13. Received injection to lateral aspect of knee, but pain and swelling is worse.  Spoke with Dr. Ree Kida and he told her to return for another injection.  She is here for this.   Review of Systems  Constitutional: Negative for fever and chills.  Respiratory: Negative for shortness of breath.   Cardiovascular: Positive for leg swelling.  Musculoskeletal: Positive for arthralgias and gait problem.       Objective:   Physical Exam  Vitals reviewed. Constitutional: She appears well-developed and well-nourished. No distress.  HENT:  Head: Normocephalic.  Cardiovascular: Normal rate.   Pulmonary/Chest: Effort normal.  Musculoskeletal: She exhibits edema and tenderness.  Right knee   Procedure: Right Knee cleaned with Betadine x 3.  Injected medially with 4cc Lidocaine and 80 mg solumedrol.  Pt. Tolerated procedure well and felt better after injection.        Assessment & Plan:

## 2013-12-26 NOTE — Assessment & Plan Note (Addendum)
S/p injection.  Hopefully will get some relief.  Not a candidate for po prednisone with recent GI bleed nor for colchicine or NSAIDS due to CKD stage 5. Tylenol and Ultram for pain.

## 2013-12-29 ENCOUNTER — Observation Stay (HOSPITAL_COMMUNITY)
Admission: EM | Admit: 2013-12-29 | Discharge: 2013-12-30 | Disposition: A | Discharge status: Home / Self Care | Payer: Medicare Other | Attending: Family Medicine | Admitting: Family Medicine

## 2013-12-29 ENCOUNTER — Telehealth: Payer: Self-pay | Admitting: Family Medicine

## 2013-12-29 ENCOUNTER — Emergency Department (HOSPITAL_COMMUNITY): Payer: Medicare Other

## 2013-12-29 ENCOUNTER — Encounter (HOSPITAL_COMMUNITY): Payer: Self-pay | Admitting: Emergency Medicine

## 2013-12-29 DIAGNOSIS — R195 Other fecal abnormalities: Secondary | ICD-10-CM

## 2013-12-29 DIAGNOSIS — M48061 Spinal stenosis, lumbar region without neurogenic claudication: Secondary | ICD-10-CM

## 2013-12-29 DIAGNOSIS — K922 Gastrointestinal hemorrhage, unspecified: Secondary | ICD-10-CM | POA: Diagnosis present

## 2013-12-29 DIAGNOSIS — E11359 Type 2 diabetes mellitus with proliferative diabetic retinopathy without macular edema: Secondary | ICD-10-CM

## 2013-12-29 DIAGNOSIS — M109 Gout, unspecified: Secondary | ICD-10-CM

## 2013-12-29 DIAGNOSIS — Z9181 History of falling: Secondary | ICD-10-CM | POA: Insufficient documentation

## 2013-12-29 DIAGNOSIS — K5900 Constipation, unspecified: Secondary | ICD-10-CM

## 2013-12-29 DIAGNOSIS — H353290 Exudative age-related macular degeneration, unspecified eye, stage unspecified: Secondary | ICD-10-CM

## 2013-12-29 DIAGNOSIS — H903 Sensorineural hearing loss, bilateral: Secondary | ICD-10-CM | POA: Insufficient documentation

## 2013-12-29 DIAGNOSIS — M7918 Myalgia, other site: Secondary | ICD-10-CM

## 2013-12-29 DIAGNOSIS — E1169 Type 2 diabetes mellitus with other specified complication: Secondary | ICD-10-CM

## 2013-12-29 DIAGNOSIS — M19019 Primary osteoarthritis, unspecified shoulder: Secondary | ICD-10-CM

## 2013-12-29 DIAGNOSIS — E1165 Type 2 diabetes mellitus with hyperglycemia: Secondary | ICD-10-CM

## 2013-12-29 DIAGNOSIS — K430 Incisional hernia with obstruction, without gangrene: Secondary | ICD-10-CM | POA: Insufficient documentation

## 2013-12-29 DIAGNOSIS — J45909 Unspecified asthma, uncomplicated: Secondary | ICD-10-CM

## 2013-12-29 DIAGNOSIS — E118 Type 2 diabetes mellitus with unspecified complications: Secondary | ICD-10-CM

## 2013-12-29 DIAGNOSIS — D649 Anemia, unspecified: Secondary | ICD-10-CM

## 2013-12-29 DIAGNOSIS — D62 Acute posthemorrhagic anemia: Secondary | ICD-10-CM

## 2013-12-29 DIAGNOSIS — N3941 Urge incontinence: Secondary | ICD-10-CM

## 2013-12-29 DIAGNOSIS — K921 Melena: Principal | ICD-10-CM | POA: Insufficient documentation

## 2013-12-29 DIAGNOSIS — I120 Hypertensive chronic kidney disease with stage 5 chronic kidney disease or end stage renal disease: Secondary | ICD-10-CM | POA: Insufficient documentation

## 2013-12-29 DIAGNOSIS — Z87442 Personal history of urinary calculi: Secondary | ICD-10-CM

## 2013-12-29 DIAGNOSIS — R109 Unspecified abdominal pain: Secondary | ICD-10-CM

## 2013-12-29 DIAGNOSIS — D126 Benign neoplasm of colon, unspecified: Secondary | ICD-10-CM | POA: Insufficient documentation

## 2013-12-29 DIAGNOSIS — R05 Cough: Secondary | ICD-10-CM

## 2013-12-29 DIAGNOSIS — N185 Chronic kidney disease, stage 5: Secondary | ICD-10-CM

## 2013-12-29 DIAGNOSIS — H547 Unspecified visual loss: Secondary | ICD-10-CM

## 2013-12-29 DIAGNOSIS — R55 Syncope and collapse: Secondary | ICD-10-CM

## 2013-12-29 DIAGNOSIS — F458 Other somatoform disorders: Secondary | ICD-10-CM

## 2013-12-29 DIAGNOSIS — J383 Other diseases of vocal cords: Secondary | ICD-10-CM

## 2013-12-29 DIAGNOSIS — K219 Gastro-esophageal reflux disease without esophagitis: Secondary | ICD-10-CM

## 2013-12-29 DIAGNOSIS — R5383 Other fatigue: Secondary | ICD-10-CM

## 2013-12-29 DIAGNOSIS — M8580 Other specified disorders of bone density and structure, unspecified site: Secondary | ICD-10-CM

## 2013-12-29 DIAGNOSIS — L219 Seborrheic dermatitis, unspecified: Secondary | ICD-10-CM

## 2013-12-29 DIAGNOSIS — R269 Unspecified abnormalities of gait and mobility: Secondary | ICD-10-CM

## 2013-12-29 DIAGNOSIS — K589 Irritable bowel syndrome without diarrhea: Secondary | ICD-10-CM

## 2013-12-29 DIAGNOSIS — D509 Iron deficiency anemia, unspecified: Secondary | ICD-10-CM | POA: Insufficient documentation

## 2013-12-29 DIAGNOSIS — R197 Diarrhea, unspecified: Secondary | ICD-10-CM | POA: Insufficient documentation

## 2013-12-29 DIAGNOSIS — Z7982 Long term (current) use of aspirin: Secondary | ICD-10-CM | POA: Insufficient documentation

## 2013-12-29 DIAGNOSIS — E875 Hyperkalemia: Secondary | ICD-10-CM

## 2013-12-29 DIAGNOSIS — E119 Type 2 diabetes mellitus without complications: Secondary | ICD-10-CM | POA: Insufficient documentation

## 2013-12-29 DIAGNOSIS — K529 Noninfective gastroenteritis and colitis, unspecified: Secondary | ICD-10-CM

## 2013-12-29 DIAGNOSIS — F32A Depression, unspecified: Secondary | ICD-10-CM

## 2013-12-29 DIAGNOSIS — E785 Hyperlipidemia, unspecified: Secondary | ICD-10-CM

## 2013-12-29 DIAGNOSIS — K639 Disease of intestine, unspecified: Secondary | ICD-10-CM

## 2013-12-29 DIAGNOSIS — N289 Disorder of kidney and ureter, unspecified: Secondary | ICD-10-CM

## 2013-12-29 DIAGNOSIS — R059 Cough, unspecified: Secondary | ICD-10-CM

## 2013-12-29 DIAGNOSIS — F329 Major depressive disorder, single episode, unspecified: Secondary | ICD-10-CM

## 2013-12-29 DIAGNOSIS — D631 Anemia in chronic kidney disease: Secondary | ICD-10-CM

## 2013-12-29 DIAGNOSIS — G4700 Insomnia, unspecified: Secondary | ICD-10-CM

## 2013-12-29 DIAGNOSIS — F411 Generalized anxiety disorder: Secondary | ICD-10-CM

## 2013-12-29 DIAGNOSIS — IMO0002 Reserved for concepts with insufficient information to code with codable children: Secondary | ICD-10-CM

## 2013-12-29 DIAGNOSIS — I200 Unstable angina: Secondary | ICD-10-CM

## 2013-12-29 DIAGNOSIS — I2510 Atherosclerotic heart disease of native coronary artery without angina pectoris: Secondary | ICD-10-CM

## 2013-12-29 DIAGNOSIS — W19XXXA Unspecified fall, initial encounter: Secondary | ICD-10-CM

## 2013-12-29 DIAGNOSIS — J309 Allergic rhinitis, unspecified: Secondary | ICD-10-CM

## 2013-12-29 DIAGNOSIS — I872 Venous insufficiency (chronic) (peripheral): Secondary | ICD-10-CM

## 2013-12-29 DIAGNOSIS — K5730 Diverticulosis of large intestine without perforation or abscess without bleeding: Secondary | ICD-10-CM | POA: Insufficient documentation

## 2013-12-29 DIAGNOSIS — H9190 Unspecified hearing loss, unspecified ear: Secondary | ICD-10-CM

## 2013-12-29 DIAGNOSIS — K469 Unspecified abdominal hernia without obstruction or gangrene: Secondary | ICD-10-CM

## 2013-12-29 DIAGNOSIS — H269 Unspecified cataract: Secondary | ICD-10-CM

## 2013-12-29 DIAGNOSIS — E1149 Type 2 diabetes mellitus with other diabetic neurological complication: Secondary | ICD-10-CM

## 2013-12-29 DIAGNOSIS — I10 Essential (primary) hypertension: Secondary | ICD-10-CM

## 2013-12-29 DIAGNOSIS — N039 Chronic nephritic syndrome with unspecified morphologic changes: Secondary | ICD-10-CM

## 2013-12-29 HISTORY — DX: Diverticulosis of intestine, part unspecified, without perforation or abscess without bleeding: K57.90

## 2013-12-29 LAB — URINALYSIS W MICROSCOPIC + REFLEX CULTURE
Bilirubin Urine: NEGATIVE
Glucose, UA: NEGATIVE mg/dL
Ketones, ur: NEGATIVE mg/dL
NITRITE: NEGATIVE
PH: 6 (ref 5.0–8.0)
Protein, ur: >300 mg/dL — AB
SPECIFIC GRAVITY, URINE: 1.017 (ref 1.005–1.030)
UROBILINOGEN UA: 1 mg/dL (ref 0.0–1.0)

## 2013-12-29 LAB — CBC
HEMATOCRIT: 30.9 % — AB (ref 36.0–46.0)
HEMATOCRIT: 27.6 % — AB (ref 36.0–46.0)
HEMOGLOBIN: 10.4 g/dL — AB (ref 12.0–15.0)
HEMOGLOBIN: 9.2 g/dL — AB (ref 12.0–15.0)
MCH: 30.6 pg (ref 26.0–34.0)
MCH: 30 pg (ref 26.0–34.0)
MCHC: 33.7 g/dL (ref 30.0–36.0)
MCHC: 33.3 g/dL (ref 30.0–36.0)
MCV: 90.9 fL (ref 78.0–100.0)
MCV: 89.9 fL (ref 78.0–100.0)
Platelets: 192 10*3/uL (ref 150–400)
Platelets: 151 10*3/uL (ref 150–400)
RBC: 3.4 MIL/uL — AB (ref 3.87–5.11)
RBC: 3.07 MIL/uL — ABNORMAL LOW (ref 3.87–5.11)
RDW: 16.8 % — ABNORMAL HIGH (ref 11.5–15.5)
RDW: 16.6 % — ABNORMAL HIGH (ref 11.5–15.5)
WBC: 10.9 10*3/uL — AB (ref 4.0–10.5)
WBC: 10.9 10*3/uL — AB (ref 4.0–10.5)

## 2013-12-29 LAB — COMPREHENSIVE METABOLIC PANEL
ALT: 86 U/L — AB (ref 0–35)
AST: 129 U/L — ABNORMAL HIGH (ref 0–37)
Albumin: 3.3 g/dL — ABNORMAL LOW (ref 3.5–5.2)
Alkaline Phosphatase: 186 U/L — ABNORMAL HIGH (ref 39–117)
BILIRUBIN TOTAL: 0.3 mg/dL (ref 0.3–1.2)
BUN: 66 mg/dL — AB (ref 6–23)
CHLORIDE: 106 meq/L (ref 96–112)
CO2: 20 mEq/L (ref 19–32)
Calcium: 9.5 mg/dL (ref 8.4–10.5)
Creatinine, Ser: 4.26 mg/dL — ABNORMAL HIGH (ref 0.50–1.10)
GFR calc non Af Amer: 9 mL/min — ABNORMAL LOW (ref >90)
GFR, EST AFRICAN AMERICAN: 10 mL/min — AB (ref >90)
GLUCOSE: 160 mg/dL — AB (ref 70–99)
POTASSIUM: 5.1 meq/L (ref 3.7–5.3)
SODIUM: 142 meq/L (ref 137–147)
TOTAL PROTEIN: 6.7 g/dL (ref 6.0–8.3)

## 2013-12-29 LAB — LACTIC ACID, PLASMA: Lactic Acid, Venous: 1.5 mmol/L (ref 0.5–2.2)

## 2013-12-29 LAB — CLOSTRIDIUM DIFFICILE BY PCR: Toxigenic C. Difficile by PCR: NEGATIVE

## 2013-12-29 LAB — TYPE AND SCREEN
ABO/RH(D): O POS
Antibody Screen: NEGATIVE

## 2013-12-29 LAB — LIPASE, BLOOD: LIPASE: 42 U/L (ref 11–59)

## 2013-12-29 LAB — OCCULT BLOOD, POC DEVICE: Fecal Occult Bld: POSITIVE — AB

## 2013-12-29 MED ORDER — ALLOPURINOL 100 MG PO TABS
100.0000 mg | ORAL_TABLET | Freq: Every day | ORAL | Status: DC
  Administered 2013-12-30: 100 mg via ORAL
  Filled 2013-12-29: qty 1

## 2013-12-29 MED ORDER — NIFEDIPINE ER 60 MG PO TB24
60.0000 mg | ORAL_TABLET | Freq: Every day | ORAL | Status: DC
  Administered 2013-12-30: 60 mg via ORAL
  Filled 2013-12-29: qty 1

## 2013-12-29 MED ORDER — ONDANSETRON HCL 4 MG/2ML IJ SOLN
4.0000 mg | INTRAMUSCULAR | Status: DC | PRN
  Administered 2013-12-29: 4 mg via INTRAVENOUS
  Filled 2013-12-29: qty 2

## 2013-12-29 MED ORDER — ALBUTEROL SULFATE HFA 108 (90 BASE) MCG/ACT IN AERS: 2.0000 | INHALATION_SPRAY | Freq: Four times a day (QID) | RESPIRATORY_TRACT | Status: DC | PRN

## 2013-12-29 MED ORDER — SODIUM CHLORIDE 0.9 % IV SOLN: INTRAVENOUS | Status: DC

## 2013-12-29 MED ORDER — BUSPIRONE HCL 15 MG PO TABS
7.5000 mg | ORAL_TABLET | Freq: Two times a day (BID) | ORAL | Status: DC
Start: 2013-12-29 — End: 2013-12-29

## 2013-12-29 MED ORDER — SODIUM CHLORIDE 0.9 % IJ SOLN: 3.0000 mL | Freq: Two times a day (BID) | INTRAMUSCULAR | Status: DC

## 2013-12-29 MED ORDER — SODIUM CHLORIDE 0.9 % IV SOLN
INTRAVENOUS | Status: DC
  Administered 2013-12-29: 14:00:00 via INTRAVENOUS

## 2013-12-29 MED ORDER — BUSPIRONE HCL 15 MG PO TABS
7.5000 mg | ORAL_TABLET | Freq: Two times a day (BID) | ORAL | Status: DC
  Administered 2013-12-29 – 2013-12-30 (×2): 7.5 mg via ORAL
  Filled 2013-12-29 (×3): qty 1

## 2013-12-29 MED ORDER — SIMVASTATIN 20 MG PO TABS
20.0000 mg | ORAL_TABLET | Freq: Every day | ORAL | Status: DC
  Filled 2013-12-29: qty 1

## 2013-12-29 MED ORDER — ALBUTEROL SULFATE (2.5 MG/3ML) 0.083% IN NEBU: 2.5000 mg | INHALATION_SOLUTION | Freq: Four times a day (QID) | RESPIRATORY_TRACT | Status: DC | PRN

## 2013-12-29 MED ORDER — TRAMADOL HCL 50 MG PO TABS
50.0000 mg | ORAL_TABLET | Freq: Two times a day (BID) | ORAL | Status: DC | PRN
  Administered 2013-12-29: 50 mg via ORAL
  Filled 2013-12-29: qty 1

## 2013-12-29 MED ORDER — SODIUM BICARBONATE 650 MG PO TABS
1300.0000 mg | ORAL_TABLET | Freq: Two times a day (BID) | ORAL | Status: DC
  Administered 2013-12-29 – 2013-12-30 (×2): 1300 mg via ORAL
  Filled 2013-12-29 (×3): qty 2

## 2013-12-29 MED ORDER — NITROGLYCERIN 0.4 MG SL SUBL: 0.4000 mg | SUBLINGUAL_TABLET | SUBLINGUAL | Status: DC | PRN

## 2013-12-29 MED ORDER — POLYETHYLENE GLYCOL 3350 17 GM/SCOOP PO POWD
17.0000 g | Freq: Every day | ORAL | Status: DC
Start: 2013-12-29 — End: 2013-12-29
  Filled 2013-12-29: qty 255

## 2013-12-29 MED ORDER — METOPROLOL SUCCINATE ER 25 MG PO TB24
25.0000 mg | ORAL_TABLET | Freq: Every day | ORAL | Status: DC
  Administered 2013-12-30: 25 mg via ORAL
  Filled 2013-12-29: qty 1

## 2013-12-29 MED ORDER — FLUTICASONE PROPIONATE 50 MCG/ACT NA SUSP
2.0000 | Freq: Every day | NASAL | Status: DC
  Filled 2013-12-29 (×2): qty 16

## 2013-12-29 MED ORDER — POLYETHYLENE GLYCOL 3350 17 G PO PACK
17.0000 g | PACK | Freq: Every day | ORAL | Status: DC
  Filled 2013-12-29: qty 1

## 2013-12-29 MED ORDER — ISOSORBIDE MONONITRATE ER 30 MG PO TB24
30.0000 mg | ORAL_TABLET | Freq: Every day | ORAL | Status: DC
  Administered 2013-12-30: 30 mg via ORAL
  Filled 2013-12-29: qty 1

## 2013-12-29 NOTE — Telephone Encounter (Signed)
Pt is being admitted to the hospital.

## 2013-12-29 NOTE — ED Provider Notes (Signed)
CSN: [id number]     Arrival date & time 12/29/13  1140 History   First MD Initiated Contact with Patient 12/29/13 1230     Chief Complaint  Patient presents with  . Melena  . Diarrhea  . Abdominal Pain    HPI Pt was seen at 1255. Per pt, c/o gradual onset and persistence of constant abd "pain" for the past 3 days. Has been associated with multiple daily intermittent episodes of diarrhea. Describes the diarrhea as "black stools."  Has been associated with nausea and poor PO intake. States she has been evaluated previously by her GI MD Amedeo Plenty for same, dx diverticular bleed/diverticulitis. Pt states she called her GI MD's office today and was told to come to the ED for further evaluation/admission. Denies CP/SOB, no back pain, no N/V, no red blood in stools/rectal bleeding, no fevers.    GI: Dr. Amedeo Plenty Past Medical History  Diagnosis Date  . Stenosis, spinal, lumbar 05/20/2006    L 3-4, 4-5  MRI  . Bilateral sensorineural hearing loss 10/2006    audiogram  . Diabetes mellitus   . Chronic kidney disease   . Diverticulosis of colon   . H/O endoscopy 11/2006    benign duodenal scar  . Normal cardiac stress test 01/30/2006    Cardiolite EF 54%  . Abnormal barium swallow 12/2009    ?thyroid nodule  . Complication of anesthesia     ' I cant take propaphol "  . Hypertension   . Shortness of breath   . Asthma   . GERD (gastroesophageal reflux disease)   . Arthritis   . PONV (postoperative nausea and vomiting)   . Diverticulosis    Past Surgical History  Procedure Laterality Date  . Combined hysterectomy abdominal w/ a&p repair / oophorectomy  1960's    for endometriosis  . Cholecystectomy open  2002  . Basal cell carcinoma excision  06/2003    gluteal fold  . Retinal laser procedure  10/2003    left eye  . Nephrectomy  12/2003    left due to non-function  . Minor breast biopsy  06/2010    right   . Esophageal dilation  10/2012    [place] [address] Dr Amedeo Plenty  . Abdominal hysterectomy     . Esophagogastroduodenoscopy N/A 12/18/2013    Procedure: ESOPHAGOGASTRODUODENOSCOPY (EGD);  Surgeon: Missy Sabins, MD;  Location: [facility] ENDOSCOPY;  Service: Endoscopy;  Laterality: N/A;  . Colonoscopy N/A 12/19/2013    Procedure: COLONOSCOPY;  Surgeon: Missy Sabins, MD;  Location: [place];  Service: Endoscopy;  Laterality: N/A;   Family History  Problem Relation Age of Onset  . Asthma Son   . Cancer Father     brain tumor  . Diabetes Father   . Cancer Mother     liver   History  Substance Use Topics  . Smoking status: Never Smoker   . Smokeless tobacco: Never Used  . Alcohol Use: No    Review of Systems ROS: Statement: All systems negative except as marked or noted in the HPI; Constitutional: Negative for fever and chills. ; ; Eyes: Negative for eye pain, redness and discharge. ; ; ENMT: Negative for ear pain, hoarseness, nasal congestion, sinus pressure and sore throat. ; ; Cardiovascular: Negative for chest pain, palpitations, diaphoresis, dyspnea and peripheral edema. ; ; Respiratory: Negative for cough, wheezing and stridor. ; ; Gastrointestinal: +abd pain, diarrhea, "black" stools, nausea. Negative for vomiting, blood in stool, hematemesis, jaundice and rectal bleeding. . ; ;  Genitourinary: Negative for dysuria, flank pain and hematuria. ; ; Musculoskeletal: Negative for back pain and neck pain. Negative for swelling and trauma.; ; Skin: Negative for pruritus, rash, abrasions, blisters, bruising and skin lesion.; ; Neuro: Negative for headache, lightheadedness and neck stiffness. Negative for weakness, altered level of consciousness , altered mental status, extremity weakness, paresthesias, involuntary movement, seizure and syncope.      Allergies  Fexofenadine; Trazodone and nefazodone; Ciprofloxacin; Diphenhydramine hcl; Gabapentin; Procaine hcl; Propofol; Sulfamethoxazole-trimethoprim; Codeine; and Doxycycline hyclate  Home Medications   Current Outpatient Rx  Name  Route   Sig  Dispense  Refill  . acetaminophen (TYLENOL) 650 MG CR tablet   Oral   Take 650 mg by mouth every 8 (eight) hours as needed for pain.         Marland Kitchen albuterol (PROVENTIL HFA;VENTOLIN HFA) 108 (90 BASE) MCG/ACT inhaler   Inhalation   Inhale 2 puffs into the lungs every 6 (six) hours as needed for wheezing or shortness of breath.         . allopurinol (ZYLOPRIM) 100 MG tablet   Oral   Take 100 mg by mouth daily.         Marland Kitchen aspirin 81 MG tablet   Oral   Take 81 mg by mouth daily.         . busPIRone (BUSPAR) 15 MG tablet   Oral   Take 7.5 mg by mouth 2 (two) times daily.         Marland Kitchen CARBOXYMETHYLCELLULOSE SODIUM OP   Both Eyes   Place 1 drop into both eyes every 4 (four) hours as needed (dry eyes).         . ferrous sulfate 325 (65 FE) MG tablet   Oral   Take 325 mg by mouth 2 (two) times daily with a meal.         . fluticasone (FLONASE) 50 MCG/ACT nasal spray   Each Nare   Place 2 sprays into both nostrils every morning.         . furosemide (LASIX) 20 MG tablet   Oral   Take 20 mg by mouth daily.         . isosorbide mononitrate (IMDUR) 30 MG 24 hr tablet   Oral   Take 1 tablet (30 mg total) by mouth daily.   30 tablet   5   . lidocaine (LIDODERM) 5 %   Transdermal   Place 1 patch onto the skin daily. Remove & Discard patch within 12 hours or as directed by MD         . loperamide (IMODIUM) 2 MG capsule   Oral   Take 2 mg by mouth 4 (four) times daily as needed.          . metoprolol succinate (TOPROL-XL) 25 MG 24 hr tablet   Oral   Take 25 mg by mouth daily. Take with or immediately following a meal.         . NIFEdipine (PROCARDIA-XL/ADALAT CC) 60 MG 24 hr tablet   Oral   Take 60 mg by mouth daily.         . nitroGLYCERIN (NITROSTAT) 0.4 MG SL tablet   Sublingual   Place 0.4 mg under the tongue every 5 (five) minutes as needed for chest pain.         . Phenol-Glycerin (CHLORASEPTIC MAX SORE THROAT) 1.5-33 % LIQD    Mouth/Throat   Use as directed 1-2 sprays in the mouth or throat every  6 (six) hours as needed (dry/sore throat).         . polyethylene glycol powder (GLYCOLAX/MIRALAX) powder   Oral   Take 17 g by mouth daily.         . Pyridoxine HCl (VITAMIN B-6) 100 MG tablet   Oral   Take 100 mg by mouth daily.          . simethicone (MYLICON) 80 MG chewable tablet   Oral   Chew 80 mg by mouth every 6 (six) hours as needed for flatulence.         . simvastatin (ZOCOR) 20 MG tablet   Oral   Take 20 mg by mouth daily.         . sodium bicarbonate 650 MG tablet   Oral   Take 650 mg by mouth 2 (two) times daily. 2 tablets BID         . traMADol (ULTRAM) 50 MG tablet   Oral   Take 50 mg by mouth every 12 (twelve) hours as needed.         Marland Kitchen glucose blood test strip      Test fasting and when symptoms of low blood sugar          BP 153/58  Pulse 69  Temp(Src) 97.9 F (36.6 C) (Oral)  Resp 16  SpO2 100% Physical Exam 1300: Physical examination:  Nursing notes reviewed; Vital signs and O2 SAT reviewed;  Constitutional: Well developed, Well nourished, In no acute distress; Head:  Normocephalic, atraumatic; Eyes: EOMI, PERRL, No scleral icterus; ENMT: Mouth and pharynx normal, Mucous membranes dry; Neck: Supple, Full range of motion, No lymphadenopathy; Cardiovascular: Regular rate and rhythm, No gallop; Respiratory: Breath sounds clear & equal bilaterally, No wheezes.  Speaking full sentences with ease, Normal respiratory effort/excursion; Chest: Nontender, Movement normal; Abdomen: Soft, +mild diffuse tenderness to palp. No rebound or guarding. Nondistended, Normal bowel sounds; Genitourinary: No CVA tenderness; Extremities: Pulses normal, No tenderness, No edema, No calf edema or asymmetry.; Neuro: AA&Ox3, Major CN grossly intact.  Speech clear. No gross focal motor or sensory deficits in extremities.; Skin: Color normal, Warm, Dry.; Psych:  Anxious.     ED Course   Procedures   EKG Interpretation   None       MDM  MDM Reviewed: previous chart, nursing note and vitals Reviewed previous: labs Interpretation: labs and CT scan     Results for orders placed during the hospital encounter of 12/29/13  CLOSTRIDIUM DIFFICILE BY PCR      Result Value Range   C difficile by pcr NEGATIVE  NEGATIVE  CBC      Result Value Range   WBC 10.9 (*) 4.0 - 10.5 K/uL   RBC 3.40 (*) 3.87 - 5.11 MIL/uL   Hemoglobin 10.4 (*) 12.0 - 15.0 g/dL   HCT 30.9 (*) 36.0 - 46.0 %   MCV 90.9  78.0 - 100.0 fL   MCH 30.6  26.0 - 34.0 pg   MCHC 33.7  30.0 - 36.0 g/dL   RDW 16.8 (*) 11.5 - 15.5 %   Platelets 192  150 - 400 K/uL  COMPREHENSIVE METABOLIC PANEL      Result Value Range   Sodium 142  137 - 147 mEq/L   Potassium 5.1  3.7 - 5.3 mEq/L   Chloride 106  96 - 112 mEq/L   CO2 20  19 - 32 mEq/L   Glucose, Bld 160 (*) 70 - 99 mg/dL   BUN 66 (*)  6 - 23 mg/dL   Creatinine, Ser 4.08 (*) 0.50 - 1.10 mg/dL   Calcium 9.5  8.4 - 14.4 mg/dL   Total Protein 6.7  6.0 - 8.3 g/dL   Albumin 3.3 (*) 3.5 - 5.2 g/dL   AST 818 (*) 0 - 37 U/L   ALT 86 (*) 0 - 35 U/L   Alkaline Phosphatase 186 (*) 39 - 117 U/L   Total Bilirubin 0.3  0.3 - 1.2 mg/dL   GFR calc non Af Amer 9 (*) >90 mL/min   GFR calc Af Amer 10 (*) >90 mL/min  LIPASE, BLOOD      Result Value Range   Lipase 42  11 - 59 U/L  URINALYSIS W MICROSCOPIC + REFLEX CULTURE      Result Value Range   Color, Urine YELLOW  YELLOW   APPearance TURBID (*) CLEAR   Specific Gravity, Urine 1.017  1.005 - 1.030   pH 6.0  5.0 - 8.0   Glucose, UA NEGATIVE  NEGATIVE mg/dL   Hgb urine dipstick LARGE (*) NEGATIVE   Bilirubin Urine NEGATIVE  NEGATIVE   Ketones, ur NEGATIVE  NEGATIVE mg/dL   Protein, ur >563 (*) NEGATIVE mg/dL   Urobilinogen, UA 1.0  0.0 - 1.0 mg/dL   Nitrite NEGATIVE  NEGATIVE   Leukocytes, UA SMALL (*) NEGATIVE   WBC, UA 3-6  <3 WBC/hpf   RBC / HPF 3-6  <3 RBC/hpf   Bacteria, UA MANY (*) RARE   Squamous  Epithelial / LPF MANY (*) RARE   Urine-Other AMORPHOUS URATES/PHOSPHATES    LACTIC ACID, PLASMA      Result Value Range   Lactic Acid, Venous 1.5  0.5 - 2.2 mmol/L  OCCULT BLOOD, POC DEVICE      Result Value Range   Fecal Occult Bld POSITIVE (*) NEGATIVE  TYPE AND SCREEN      Result Value Range   ABO/RH(D) O POS     Antibody Screen NEG     Sample Expiration 01/01/2014     Ct Abdomen Pelvis Wo Contrast 12/29/2013   CLINICAL DATA:  Abdominal pain, rectal bleeding  EXAM: CT ABDOMEN AND PELVIS WITHOUT CONTRAST  TECHNIQUE: Multidetector CT imaging of the abdomen and pelvis was performed following the standard protocol without intravenous contrast.  COMPARISON:  None.  FINDINGS: Trace bilateral pleural effusions. Mild dependent atelectasis left lung base.  Unenhanced liver is grossly unremarkable.  Spleen and adrenal glands are within normal limits.  Pancreatic atrophy.  Status post cholecystectomy. No intrahepatic or extrahepatic ductal dilatation.  Status post left nephrectomy. Right kidney is notable for vascular calcifications but is otherwise unremarkable. No hydronephrosis.  No evidence of bowel obstruction. Appendix is not discretely visualized. Extensive colonic diverticulosis, without associated inflammatory changes.  Extensive atherosclerotic calcifications of the abdominal aorta and branch vessels.  Small volume abdominopelvic ascites.  No suspicious abdominopelvic lymphadenopathy.  Status post hysterectomy.  No adnexal masses.  Bladder is within normal limits.  Calcified pelvic phleboliths.  Moderate left posterolateral incisional hernia containing fat and nondilated descending colon (Series 2/image 30).  Postsurgical changes in the anterior abdominal wall.  Degenerative changes of the visualized thoracolumbar spine. Grade 1 anterolisthesis of L5 on S1.  IMPRESSION: No evidence of bowel obstruction. Extensive colonic diverticulosis, without associated inflammatory changes.  Status post  cholecystectomy, left nephrectomy, and hysterectomy.  Moderate left posterolateral incisional hernia containing fat and descending colon.   Electronically Signed   By: Charline Bills M.D.   On: 12/29/2013 15:46  Results for EMAN, RYNDERS (MRN [id number]) as of 12/29/2013 15:52  Ref. Range 12/21/2013 05:20 12/22/2013 12:09 12/29/2013 11:49  BUN Latest Range: 4-21 mg/dL 60 (H) 63 (H) 66 (H)  Creatinine Latest Range: 0.50-1.10 mg/dL 4.94 (H) 4.83 (H) 4.26 (H)   Results for DICKIE, LABARRE (MRN [id number]) as of 12/29/2013 15:52  Ref. Range 12/17/2013 05:39 12/29/2013 11:49  AST Latest Range: 0-37 U/L 38 (H) 129 (H)  ALT Latest Range: 0-35 U/L 16 86 (H)   Results for ZAHARI, FAZZINO (MRN [id number]) as of 12/29/2013 15:52  Ref. Range 12/20/2013 16:43 12/21/2013 00:09 12/21/2013 05:20 12/22/2013 12:09 12/29/2013 11:49  Hemoglobin Latest Range: 12.0-15.0 g/dL 7.9 (L) 9.5 (L) 9.6 (L) 10.8 (L) 10.4 (L)  HCT Latest Range: 36.0-46.0 % 23.2 (L) 28.5 (L) 28.8 (L) 32.9 (L) 30.9 (L)    1555:  Pt has stooled multiple times while in the ED. GI pathogen panel pending. Dx and testing d/w pt and family.  Questions answered.  Verb understanding, agreeable to admit.  T/C to [facility] Resident, case discussed, including:  HPI, pertinent PM/SHx, VS/PE, dx testing, ED course and treatment:  Agreeable to admit, requests to write temporary orders, obtain medical bed to Dr. Rosario Jacks service.    Alfonzo Feller, DO 12/31/13 1725

## 2013-12-29 NOTE — ED Notes (Signed)
NOTIFIED DR ,[place] [facility] BY PHONE OF PATIENTS LAB RESULTS OF BLOOD STOOL CARD ,12/29/2013.

## 2013-12-29 NOTE — ED Notes (Signed)
Family practice at bedside.

## 2013-12-29 NOTE — ED Notes (Signed)
Pt is here with rectal bleeding and was told by GI MD to come to ED.  Pt reports pain in abdomen.  Pt was just discharged from Hospital for same thing on 1/18

## 2013-12-29 NOTE — H&P (Signed)
[facility] Admission History and Physical Service Pager: [phone]  Patient name: Belinda [name] Medical record number: [id number] Date of birth: [birth year] Age: 80 y.o. Gender: female  Primary Care Provider: Lupita Dawn, MD Consultants: Cardiology Code Status: Full; HCPOA (Son [name])  Chief Complaint: Melanotic diarrhea  Assessment and Plan: Belinda [name] is a 80 y.o. female presenting with melanotic diarrhea.  PMH is significant for HTN, CKD stage 5, severe Diverticulosis with recent discharge symptomatic anemia secondary to GI losses one week ago.  # Melanotic diarrhea with chronic anemia, severe diverticulosis, transverse colonic polyps x2: Likely multifactorial diarrhea but most concerning for acute on chronic GI bleed given extensive number.  Underwent recent unremarkable endoscopy and colonoscopy that was unsuccessful to 2 tortuosity of sigmoid colon.  It was noted she had significant amount of retained stool in the rectum in spite of bowel prep.  2 polyps were noted in the transverse colon on barium enema but these were felt to have a low likelihood to contributing to significant bleeding.  C. difficile negative in the emergency department Hemoccult-positive, stool studies collected.  Multiple melanotic stools while in the emergency department.  No further bowel movements once on the floor - Trend hemoglobins to insure not and acute on chronic bleed precipitous bleed but hemoglobin currently stable.  Status post transfusion for symptomatic anemia during last hospitalization. - Continue bowel regimen and avoid antidiarrheal agents as she has a history of overflow diarrhea with recently noted large stone burden likely secondary to colonic dysmotility secondary to antimotility agents and chronic constipation - No PPI as recent endoscopy without evidence of source and no new symptoms - Allow by mouth tonight, n.p.o. after midnight - Note CT scan findings of bowel  containing posteriolateral hernia; this is likely the reason the colonoscope was not able to be passed beyond the sigmoid colon during last hospitalization.   # HTN - Markedly elevated at last visit, elevated but nonmalignant range today - Continue home medications  # Anemia: chronic (likely due to CKD and chronic GI losses); see above. - If hemoglobin is stable overnight will plan to discharge and have followup with GI as an outpatient party scheduled for Wednesday 12/31/2013; if trends to less than 10 will request inpatient consult and consider transfusion as was symptomatic with hemoglobin of 8 previously  # CKD Stage 5 not on HD: Baseline Cr ~ 3.5,  4.8 at discharge on 12/22/2013 -  slightly improved today. - Not interested in dialysis,  - avoid nephrotoxic agents - Nephrologist: Dr Joni Fears w/ [facility]   # Gout: Recent gout flare with right knee injection x2 with significant improvement. -Continue allopurinol - Avoid nephrotoxic agents  Chronic Conditions # DM: A1c 5.5 (10/2013): Diet controlled  # HLD: Continue Zocor # Anxiety: Buspar  FEN/GI: Heart healthy diet; KVO fluids, n.p.o. after midnight  Prophylaxis: SCDs; (due to concern for GI bleed)  Disposition: observed in telemetry overnight   History of Present Illness: Belinda [name] is a 80 y.o. female presenting with  three-day history of dark melanotic stools that have increased in frequency over the past 24 hours with greater than 4 BMs prior to arrival in the emergency department that were described as black, tarry, and stringy.  She reports compliance with her home bowel regimen that was started after her last hospitalization but that the stools have become progressively blacker and more frequent.  Due to the nature of her known profound diverticulosis and previous GI bleeds  upon the request of her gastroenterologist she presented to the emergency department for evaluation where she had multiple episodes of melanotic  diarrhea that was Hemoccult-positive, C. difficile negative, stool studies pending.  She denies any chest pain, orthopnea, shortness of breath.  Denies fever, malaise, fatigue that is different than baseline.  She has undergone 2 knee injections since her last discharge for gouty arthropathy with significant improvement following the second.  She denies any dyspepsia, nausea, vomiting.    She has been taking iron supplements since discharge as well as MiraLax.  Still having a difficult time passing stool and occasion immediately prior to the episodes of diarrhea today.  Review Of Systems: Per HPI with the following additions:  Otherwise 12 point review of systems was performed and was unremarkable.  Patient Active Problem List   Diagnosis Date Noted  . GI bleeding 12/29/2013  . Hyperkalemia 12/22/2013  . Anemia 12/22/2013  . Unstable angina 12/17/2013  . CAD (coronary artery disease) 12/17/2013  . Acute blood loss anemia 12/17/2013  . GIB (gastrointestinal bleeding) 12/17/2013  . Insomnia 11/22/2013  . Constipation 10/24/2013  . Cough 09/05/2013  . Dark stools 07/31/2013  . Enteritis 07/31/2013  . Fall 07/01/2013  . Musculoskeletal pain 07/01/2013  . Seborrheic dermatitis, unspecified 04/08/2013  . AC (acromioclavicular) joint arthritis 03/25/2013  . Fatigue 09/25/2012  . Osteopenia 08/26/2012  . Syncope 08/01/2012  . Vocal cord atrophy 09/26/2011  . Depression 08/10/2011  . Diabetic enteropathy 04/07/2011  . ASTHMA, INTERMITTENT 09/25/2010  . MACULAR DEGENERATION, EXUDATIVE 08/05/2010  . AEROPHAGIA 03/10/2010  . VENOUS INSUFFICIENCY 03/04/2010  . VISUAL ACUITY, DECREASED, RIGHT EYE 12/09/2009  . IBS 02/11/2009  . CKD (chronic kidney disease) stage 5, GFR less than 15 ml/min 10/22/2008  . URINARY INCONTINENCE, URGE 06/15/2008  . Gout 04/28/2008  . ATHEROSCLEROTIC CARDIOVASCULAR DISEASE 01/07/2008  . Anemia in chronic kidney disease(285.21) 10/24/2007  . UNSTEADY GAIT  06/18/2007  . RETINOPATHY, DIABETIC, PROLIFERATIVE 02/23/2007  . PSEUDOGOUT 02/07/2007  . HYPERTENSION 02/07/2007  . HERNIA, SITE NOS W/O OBSTRUCTION/GANGRENE 02/07/2007  . NEPHROLITHIASIS, HX OF 02/07/2007  . NEUROPATHY, DIABETIC 01/31/2007  . DIABETES MELLITUS, II, COMPLICATIONS Q000111Q  . HYPERLIPIDEMIA 01/31/2007  . ANXIETY 01/31/2007  . CATARACT 01/31/2007  . HEARING LOSS NOS OR DEAFNESS 01/31/2007  . RHINITIS, ALLERGIC 01/31/2007  . GASTROESOPHAGEAL REFLUX, NO ESOPHAGITIS 01/31/2007  . LUMBAR SPINAL STENOSIS 01/31/2007   Past Medical History: Past Medical History  Diagnosis Date  . Stenosis, spinal, lumbar 05/20/2006    L 3-4, 4-5  MRI  . Bilateral sensorineural hearing loss 10/2006    audiogram  . Diabetes mellitus   . Chronic kidney disease   . Diverticulosis of colon   . H/O endoscopy 11/2006    benign duodenal scar  . Normal cardiac stress test 01/30/2006    Cardiolite EF 54%  . Abnormal barium swallow 12/2009    ?thyroid nodule  . Complication of anesthesia     ' I cant take propaphol "  . Hypertension   . Shortness of breath   . Asthma   . GERD (gastroesophageal reflux disease)   . Arthritis   . PONV (postoperative nausea and vomiting)   . Diverticulosis    Past Surgical History: Past Surgical History  Procedure Laterality Date  . Combined hysterectomy abdominal w/ a&p repair / oophorectomy  1960's    for endometriosis  . Cholecystectomy open  2002  . Basal cell carcinoma excision  06/2003    gluteal fold  . Retinal laser procedure  10/2003    left eye  . Nephrectomy  12/2003    left due to non-function  . Minor breast biopsy  06/2010    right   . Esophageal dilation  10/2012    [place] [facility] Dr [facility]enty  . Abdominal hysterectomy    . Esophagogastroduodenoscopy N/A 12/18/2013    Procedure: ESOPHAGOGASTRODUODENOSCOPY (EGD);  Surgeon: Missy Sabins, MD;  [facility] ENDOSCOPY;  Service: Endoscopy;  Laterality: N/A;  . Colonoscopy N/A 12/19/2013     Procedure: COLONOSCOPY;  Surgeon: Missy Sabins, MD;  Location: [place];  Service: Endoscopy;  Laterality: N/A;   Social History: History  Substance Use Topics  . Smoking status: Never Smoker   . Smokeless tobacco: Never Used  . Alcohol Use: No   Additional social history:   Please also refer to relevant sections of EMR.  Family History: Family History  Problem Relation Age of Onset  . Asthma Son   . Cancer Father     brain tumor  . Diabetes Father   . Cancer Mother     liver   Allergies and Medications: Allergies  Allergen Reactions  . Fexofenadine Other (See Comments)    confusion  . Trazodone And Nefazodone     Lightheadedness  . Ciprofloxacin Other (See Comments)    REACTION: Headache and stomachache  . Diphenhydramine Hcl Other (See Comments)    REACTION: hyperactive  . Gabapentin Other (See Comments)    REACTION: leg pain  . Procaine Hcl     REACTION: Rash  . Propofol Diarrhea and Nausea And Vomiting    Had endoscopy at Dr Amedeo Plenty 10/2012  . Sulfamethoxazole-Trimethoprim     REACTION: Nausea  . Codeine Rash    REACTION: Rash  . Doxycycline Hyclate Rash    REACTION: rash?   No current facility-administered medications on file prior to encounter.   Current Outpatient Prescriptions on File Prior to Encounter  Medication Sig Dispense Refill  . acetaminophen (TYLENOL) 650 MG CR tablet Take 650 mg by mouth every 8 (eight) hours as needed for pain.      Marland Kitchen aspirin 81 MG tablet Take 81 mg by mouth daily.      . isosorbide mononitrate (IMDUR) 30 MG 24 hr tablet Take 1 tablet (30 mg total) by mouth daily.  30 tablet  5  . loperamide (IMODIUM) 2 MG capsule Take 2 mg by mouth 4 (four) times daily as needed.       . Pyridoxine HCl (VITAMIN B-6) 100 MG tablet Take 100 mg by mouth daily.       . simethicone (MYLICON) 80 MG chewable tablet Chew 80 mg by mouth every 6 (six) hours as needed for flatulence.      . sodium bicarbonate 650 MG tablet Take 650 mg by mouth 2  (two) times daily. 2 tablets BID        Objective: BP 175/66  Pulse 77  Temp(Src) 97.9 F (36.6 C) (Oral)  Resp 16  SpO2 98% Exam: General: Frail elderly female; NAD HEENT: MMM; PERRL Cardiovascular: RRR no m/r/g Respiratory: CTAB, normal respiratory effort Abdomen: Soft,  mild tenderness over previous hysterectomy incision, large left  posterior flank fascial herniation Extremities: WWP; No LE edema,  Skin: senile purpura   Labs and Imaging: Basic Labs Extended:   Recent Labs Lab 12/29/13 1149  WBC 10.9*  HGB 10.4*  HCT 30.9*  PLT 192    Recent Labs Lab 12/29/13 1149  NA 142  K 5.1  CL 106  CO2  20  BUN 66*  CREATININE 4.26*  GLUCOSE 160*  CALCIUM 9.5    Recent Labs Lab 12/29/13 1149 12/29/13 1252  ALBUMIN 3.3*  --   ALT 86*  --   AST 129*  --   ALKPHOS 186*  --   BILITOT 0.3  --   LIPASE  --  42    Recent Labs Lab 12/29/13 1313  LATICACIDVEN 1.5       Recent Labs  12/17/13 0539 12/17/13 1907 12/18/13 0535 12/19/13 0514 12/20/13 0425 12/20/13 1643 12/21/13 0009 12/21/13 0520 12/22/13 1209 12/29/13 1149  HGB 7.1* 8.9* 8.8* 8.7* 8.1* 7.9* 9.5* 9.6* 10.8* 10.4*    Urinalysis: Further Urine Studies:  1/26 - Contaminant    IMAGING: 12/29/13 - CT AbdPelv - No obstruction, extensive diverticulosis (no itis).  Posteriolateral incision containing descending colon  MICRO: ABX: MICRO:   Stool Studies: C Dif: Neg   Gerda Diss, DO 12/29/2013, 5:42 PM PGY-3 [place] Intern pager: [phone], text pages welcome

## 2013-12-29 NOTE — Telephone Encounter (Signed)
Will fwd. To PCP for info. Belinda [name], Belinda [name]

## 2013-12-30 LAB — CBC
HCT: 27.3 % — ABNORMAL LOW (ref 36.0–46.0)
Hemoglobin: 9.2 g/dL — ABNORMAL LOW (ref 12.0–15.0)
MCH: 30.5 pg (ref 26.0–34.0)
MCHC: 33.7 g/dL (ref 30.0–36.0)
MCV: 90.4 fL (ref 78.0–100.0)
PLATELETS: 152 10*3/uL (ref 150–400)
RBC: 3.02 MIL/uL — AB (ref 3.87–5.11)
RDW: 16.5 % — AB (ref 11.5–15.5)
WBC: 9.6 10*3/uL (ref 4.0–10.5)

## 2013-12-30 LAB — GI PATHOGEN PANEL BY PCR, STOOL
C DIFFICILE TOXIN A/B: NEGATIVE
Campylobacter by PCR: NEGATIVE
Cryptosporidium by PCR: NEGATIVE
E COLI (STEC): NEGATIVE
E COLI 0157 BY PCR: NEGATIVE
E coli (ETEC) LT/ST: NEGATIVE
G lamblia by PCR: NEGATIVE
Norovirus GI/GII: NEGATIVE
Rotavirus A by PCR: NEGATIVE
Salmonella by PCR: NEGATIVE
Shigella by PCR: NEGATIVE

## 2013-12-30 LAB — URINE CULTURE

## 2013-12-30 MED ORDER — SODIUM CHLORIDE 0.9 % IV SOLN
1020.0000 mg | Freq: Once | INTRAVENOUS | Status: AC
  Administered 2013-12-30: 1020 mg via INTRAVENOUS
  Filled 2013-12-30: qty 34

## 2013-12-30 MED ORDER — TRAMADOL HCL 50 MG PO TABS
50.0000 mg | ORAL_TABLET | Freq: Once | ORAL | Status: AC
  Administered 2013-12-30: 50 mg via ORAL

## 2013-12-30 NOTE — H&P (Signed)
[facility] Attending Admit Note Patient seen and examined by me on day of admission, Monday, December 29, 2013.  I have discussed patient's care with admitting resident Dr Paulla Fore and I agree with his assessment and plan.   Dalbert Mayotte, MD

## 2013-12-30 NOTE — Discharge Summary (Signed)
[facility] Discharge Summary  Patient name: Belinda [name] Medical record number: [id number] Date of birth: Dec 11, 1926 Age: 80 y.o. Gender: female Date of Admission: 12/29/2013  Date of Discharge: 12/30/2013 Admitting Physician: Willeen Niece, MD  Primary Care Provider: Lupita Dawn, MD Consultants: Cardiology, GI phone call  Indication for Hospitalization: melanotic diarrhea  Discharge Diagnoses/Problem List:  Melanotic diarrhea (stable) HTN Diverticulosis Chronic Anemia (2/2 GI loss, CKD) Chronic Arthritis CKD stage V, not on HD DM Type 2 HLD Hx Gout Hx anxiety  Disposition: Discharge home  Discharge Condition: stable  Discharge Exam:  BP 149/47  Pulse 67  Temp(Src) 98 F (36.7 C) (Oral)  Resp 16  SpO2 95%  Exam:  General: Frail elderly female; NAD  HEENT: MMM; PERRL  Cardiovascular: RRR no r/g. Systolic 2/6 murmur.  Respiratory: CTAB, normal respiratory effort  Abdomen: Soft, NTND, + BS  Extremities: WWP; No LE edema Skin: senile purpura   Brief Hospital Course:   Patient presented to the hospital reporting 3 days of dark melanotic stools with increasing frequency. She had recent hospitalization for GI bleed with transfusion but no apparent source after diagnostic workup. She has recently been started on Iron. She was started on a bowel regimen at discharge prior due to considerable stool burden even after bowel prep for colonoscopy.  She was in no acute distress in the hospital. Was given fluids in the ED and her Hgb went from 10.4 to 9.2 with hydration but remained 9.2 overnight. CT scan revealed a herniated loop of descending colon in her posterior L flank. Patient is s/p L nephrectomy. She continues to be in no acute distress except for increasing pain in her L shoulder after a blood draw which has chronic arthritis for past falls. L shoulder steroid injection was considered and discussed but not performed as patient was having some  relief with a lidoderm patch  Issues for Follow Up:  - For outpatient Nephrology - Started Feraheme, consider continuing this and beginning EPO administration - GI clinic visit tomorrow to evaluate continued GI bleed and herniated bowel - no apparent incarceration - L shoulder pain, appears chronic, patient concearned b/c she states she had some old films at [facility] which were never reviewed.   Significant Procedures: None  Significant Labs and Imaging:   Recent Labs Lab 12/29/13 1149 12/29/13 2210 12/30/13 0456  WBC 10.9* 10.9* 9.6  HGB 10.4* 9.2* 9.2*  HCT 30.9* 27.6* 27.3*  PLT 192 151 152    Recent Labs Lab 12/29/13 1149  NA 142  K 5.1  CL 106  CO2 20  GLUCOSE 160*  BUN 66*  CREATININE 4.26*  CALCIUM 9.5  ALKPHOS 186*  AST 129*  ALT 86*  ALBUMIN 3.3*    IMAGING:  12/29/13 - CT AbdPelv - No obstruction, extensive diverticulosis (no itis). Posteriolateral incision containing descending colon   Results/Tests Pending at Time of Discharge: None  Discharge Medications:    Medication List         acetaminophen 650 MG CR tablet  Commonly known as:  TYLENOL  Take 650 mg by mouth every 8 (eight) hours as needed for pain.     albuterol 108 (90 BASE) MCG/ACT inhaler  Commonly known as:  PROVENTIL HFA;VENTOLIN HFA  Inhale 2 puffs into the lungs every 6 (six) hours as needed for wheezing or shortness of breath.     allopurinol 100 MG tablet  Commonly known as:  ZYLOPRIM  Take 100 mg by mouth  daily.     aspirin 81 MG tablet  Take 81 mg by mouth daily.     busPIRone 15 MG tablet  Commonly known as:  BUSPAR  Take 7.5 mg by mouth 2 (two) times daily.     CARBOXYMETHYLCELLULOSE SODIUM OP  Place 1 drop into both eyes every 4 (four) hours as needed (dry eyes).     CHLORASEPTIC MAX SORE THROAT 1.5-33 % Liqd  Generic drug:  Phenol-Glycerin  Use as directed 1-2 sprays in the mouth or throat every 6 (six) hours as needed (dry/sore throat).     ferrous  sulfate 325 (65 FE) MG tablet  Take 325 mg by mouth 2 (two) times daily with a meal.     fluticasone 50 MCG/ACT nasal spray  Commonly known as:  FLONASE  Place 2 sprays into both nostrils every morning.     furosemide 20 MG tablet  Commonly known as:  LASIX  Take 20 mg by mouth daily.     glucose blood test strip  Test fasting and when symptoms of low blood sugar     isosorbide mononitrate 30 MG 24 hr tablet  Commonly known as:  IMDUR  Take 1 tablet (30 mg total) by mouth daily.     lidocaine 5 %  Commonly known as:  LIDODERM  Place 1 patch onto the skin daily. Remove & Discard patch within 12 hours or as directed by MD     loperamide 2 MG capsule  Commonly known as:  IMODIUM  Take 2 mg by mouth 4 (four) times daily as needed.     metoprolol succinate 25 MG 24 hr tablet  Commonly known as:  TOPROL-XL  Take 25 mg by mouth daily. Take with or immediately following a meal.     NIFEdipine 60 MG 24 hr tablet  Commonly known as:  PROCARDIA-XL/ADALAT CC  Take 60 mg by mouth daily.     nitroGLYCERIN 0.4 MG SL tablet  Commonly known as:  NITROSTAT  Place 0.4 mg under the tongue every 5 (five) minutes as needed for chest pain.     polyethylene glycol powder powder  Commonly known as:  GLYCOLAX/MIRALAX  Take 17 g by mouth daily.     pyridOXINE 100 MG tablet  Commonly known as:  VITAMIN B-6  Take 100 mg by mouth daily.     simethicone 80 MG chewable tablet  Commonly known as:  MYLICON  Chew 80 mg by mouth every 6 (six) hours as needed for flatulence.     simvastatin 20 MG tablet  Commonly known as:  ZOCOR  Take 20 mg by mouth daily.     sodium bicarbonate 650 MG tablet  Take 650 mg by mouth 2 (two) times daily. 2 tablets BID     traMADol 50 MG tablet  Commonly known as:  ULTRAM  Take 50 mg by mouth every 12 (twelve) hours as needed.        Discharge Instructions: Please refer to Patient Instructions section of EMR for full details.  Patient was counseled important  signs and symptoms that should prompt return to medical care, changes in medications, dietary instructions, activity restrictions, and follow up appointments.   Follow-Up Appointments: Follow-up Information   Follow up with Lupita Dawn, MD On 01/06/2014. (At 1:45)    Specialty:  Family Medicine   Contact information:   [place] [address] [phone]     GI clinic appointment tomorrow Wednesday 12/31/13 at 9:30am  Sherley Bounds  Wendi Snipes, MD 12/31/2013, 9:28 AM

## 2013-12-30 NOTE — Progress Notes (Signed)
[facility] Admission History and Physical Service Pager: [phone]  Patient name: LORAN AUGUSTE Medical record number: [id number] Date of birth: 1927-05-03 Age: 80 y.o. Gender: female  Primary Care Provider: Lupita Dawn, MD Consultants: Cardiology, GI Code Status: Full; HCPOA (Son - [name])  Pt Overview and Major Events to Date: 1/26- admit with melanotic diarrhea, Hgb 10.4 >> 9.2 after fluids, CT scan shows loop of bowel in hernia and stool burden 1/27- Hgb 9.2 >> 9.2, Spoke with GI who will see her tomorrow in clinic  Assessment and Plan: PAIGE MONARREZ is a 80 y.o. female presenting with melanotic diarrhea.  PMH is significant for HTN, CKD stage 5, severe Diverticulosis with recent discharge symptomatic anemia secondary to GI losses one week ago.  # Melanotic diarrhea with chronic anemia, severe diverticulosis, transverse colonic polyps x2: Likely multifactorial diarrhea but most concerning for acute on chronic GI bleed given extensive number.  Underwent recent unremarkable endoscopy and colonoscopy that was unsuccessful to 2 tortuosity of sigmoid colon.  It was noted she had significant amount of retained stool in the rectum in spite of bowel prep.  2 polyps were noted in the transverse colon on barium enema but these were felt to have a low likelihood to contributing to significant bleeding.  C. difficile negative in the emergency department Hemoccult-positive, stool studies collected.  Multiple melanotic stools while in the emergency department.  No further bowel movements once on the floor - Trend hemoglobins to insure not an acute on chronic bleed precipitous bleed but hemoglobin currently stable.  Status post transfusion for symptomatic anemia during last hospitalization. - Continue bowel regimen and avoid antidiarrheal agents as she has a history of overflow diarrhea with recently noted large stool burden likely secondary to colonic dysmotility secondary to  antimotility agents and chronic constipation - No PPI as recent endoscopy without evidence of source and no new symptoms - Note CT scan findings of bowel containing posteriolateral hernia; this is likely the reason the colonoscope was not able to be passed beyond the sigmoid colon during last hospitalization.  - Discussed with GI, they will see tomorrow in clinic, no acute procedures needed.   # HTN - Markedly elevated at last visit, elevated but nonmalignant range today - Continue home medications  # Anemia: chronic (likely due to CKD and chronic GI losses); see above. - Spoke with GI and they will see her in clinic tomorrow since her Hgb seems stable at 9.2.  - Give one dose of Feraheme today. Defer EPO administration to outpatient nephrology.  # CKD Stage 5 not on HD: Baseline Cr ~ 3.5,  4.8 at discharge on 12/22/2013 - slightly improved today. - Not interested in dialysis,  - avoid nephrotoxic agents - Nephrologist: Dr Joni Fears w/ [facility]   # Gout: Recent gout flare with right knee injection x2 with significant improvement. - Continue allopurinol - Avoid nephrotoxic agents  # Arthritic Left shoulder: Pt has had injections in the past with increased pain during this admission. - Consider steroid injection for symptom relief  Chronic Conditions # DM: A1c 5.5 (10/2013): Diet controlled  # HLD: Continue Zocor # Anxiety: Buspar  FEN/GI: Cardiac Diet Prophylaxis: SCDs; (due to concern for GI bleed)  Disposition: Pending GI recommendations and stable hemoglobin  Subjective: Patient reports only 1 BM today but complaining of L shoulder pain after blood draw.   Objective: BP 149/47  Pulse 67  Temp(Src) 98 F (36.7 C) (Oral)  Resp 16  SpO2  95% Exam: General: Frail elderly female; NAD HEENT: MMM; PERRL Cardiovascular: RRR no r/g. Systolic 2/6 murmur. Respiratory: CTAB, normal respiratory effort Abdomen: Soft, non-tender, large left  posterior flank fascial  herniation Extremities: WWP; No LE edema,  Skin: senile purpura   Labs and Imaging: Basic Labs Extended:   Recent Labs Lab 12/29/13 1149 12/29/13 2210 12/30/13 0456  WBC 10.9* 10.9* 9.6  HGB 10.4* 9.2* 9.2*  HCT 30.9* 27.6* 27.3*  PLT 192 151 152     Recent Labs Lab 12/29/13 1149  NA 142  K 5.1  CL 106  CO2 20  BUN 66*  CREATININE 4.26*  GLUCOSE 160*  CALCIUM 9.5     Recent Labs Lab 12/29/13 1149 12/29/13 1252  ALBUMIN 3.3*  --   ALT 86*  --   AST 129*  --   ALKPHOS 186*  --   BILITOT 0.3  --   LIPASE  --  42    Recent Labs Lab 12/29/13 1313  LATICACIDVEN 1.5       Recent Labs  12/18/13 0535 12/19/13 0514 12/20/13 0425 12/20/13 1643 12/21/13 0009 12/21/13 0520 12/22/13 1209 12/29/13 1149 12/29/13 2210 12/30/13 0456  HGB 8.8* 8.7* 8.1* 7.9* 9.5* 9.6* 10.8* 10.4* 9.2* 9.2*     Urinalysis: Further Urine Studies:  1/26 - Contaminant    IMAGING: 12/29/13 - CT AbdPelv - No obstruction, extensive diverticulosis (no itis).  Posteriolateral incision containing descending colon  MICRO: ABX: MICRO:   Stool Studies: C Dif: Neg   Charline Bills, Med Student 12/30/2013, 9:42 AM  I have examined the patient and agree with the medical student's documentation above. My annotations are in blue and my findings are below.   Gen: NAD, alert, cooperative with exam, frail appearing HEENT: NCAT, CV: RRR, 2/6 systolic murmur Resp: CTABL, no wheezes, non-labored Abd: SNTND, BS present, no guarding or organomegaly Ext: No edema, warm Neuro: Alert and oriented, No gross deficits  Ms Vinsant is an 80 y/o female with PMH of HTN, CKD stage 5, severe Diverticulosis with recent discharge symptomatic anemia secondary to GI losses one week ago admitted for suspected melanotic diarrhea. Her hemoglobin has remained stable on our last two checks and she has very close GI follow up tomorrow. She is likely ready for discharge today but we will plan to treat her shoulder pain  with steroid joint injection before dc.   WIth her persistent iron deficiency anemia we will give a dose of ferraheme before dc. Epogen may also be helpful in anemia of chronic renal disease but we will defer that treatment to outpt nephro.   Laroy Apple, MD [place] Resident, PGY-2 12/30/2013, 1:11 PM

## 2013-12-30 NOTE — Progress Notes (Signed)
Discharge home. Home discharge instruction given, no questions verbalized. 

## 2013-12-30 NOTE — Discharge Instructions (Signed)
You were admitted because of concerns of a bleed in your GI system. Your last 2 blood level checks have been normal and we discussed her case with GI doctors who would like to see you in clinic tomorrow.  Be sure to followup with the GI doctor tomorrow as you have scheduled and with Dr. Ree Kida next Tuesday at 1:45.  Gastrointestinal Bleeding Gastrointestinal (GI) bleeding means there is bleeding somewhere along the digestive tract, between the mouth and anus. CAUSES  There are many different problems that can cause GI bleeding. Possible causes include:  Esophagitis. This is inflammation, irritation, or swelling of the esophagus.  Hemorrhoids.These are veins that are full of blood (engorged) in the rectum. They cause pain, inflammation, and may bleed.  Anal fissures.These are areas of painful tearing which may bleed. They are often caused by passing hard stool.  Diverticulosis.These are pouches that form on the colon over time, with age, and may bleed significantly.  Diverticulitis.This is inflammation in areas with diverticulosis. It can cause pain, fever, and bloody stools, although bleeding is rare.  Polyps and cancer. Colon cancer often starts out as precancerous polyps.  Gastritis and ulcers.Bleeding from the upper gastrointestinal tract (near the stomach) may travel through the intestines and produce black, sometimes tarry, often bad smelling stools. In certain cases, if the bleeding is fast enough, the stools may not be black, but red. This condition may be life-threatening. SYMPTOMS   Vomiting bright red blood or material that looks like coffee grounds.  Bloody, black, or tarry stools. DIAGNOSIS  Your caregiver may diagnose your condition by taking your history and performing a physical exam. More tests may be needed, including:  X-rays and other imaging tests.  Esophagogastroduodenoscopy (EGD). This test uses a flexible, lighted tube to look at your esophagus, stomach,  and small intestine.  Colonoscopy. This test uses a flexible, lighted tube to look at your colon. TREATMENT  Treatment depends on the cause of your bleeding.   For bleeding from the esophagus, stomach, small intestine, or colon, the caregiver doing your EGD or colonoscopy may be able to stop the bleeding as part of the procedure.  Inflammation or infection of the colon can be treated with medicines.  Many rectal problems can be treated with creams, suppositories, or warm baths.  Surgery is sometimes needed.  Blood transfusions are sometimes needed if you have lost a lot of blood. If bleeding is slow, you may be allowed to go home. If there is a lot of bleeding, you will need to stay in the hospital for observation. HOME CARE INSTRUCTIONS   Take any medicines exactly as prescribed.  Keep your stools soft by eating foods that are high in fiber. These foods include whole grains, legumes, fruits, and vegetables. Prunes (1 to 3 a day) work well for many people.  Drink enough fluids to keep your urine clear or pale yellow. SEEK IMMEDIATE MEDICAL CARE IF:   Your bleeding increases.  You feel lightheaded, weak, or you faint.  You have severe cramps in your back or abdomen.  You pass large blood clots in your stool.  Your problems are getting worse. MAKE SURE YOU:   Understand these instructions.  Will watch your condition.  Will get help right away if you are not doing well or get worse. Document Released: 11/17/2000 Document Revised: 11/06/2012 Document Reviewed: 10/30/2011 [facility] Patient Information 2014 [place], [place].

## 2013-12-30 NOTE — Progress Notes (Signed)
FMTS Attending Daily Note: Dorcas Mcmurray MD [phone] pager office [phone] I  have seen and examined this patient, reviewed their chart. I have discussed this patient with the resident. I agree with the resident's findings, assessment and care plan. She is agreeable to d/c as no  More bleeding and she has AM appt with GI. Stable.

## 2013-12-30 NOTE — Progress Notes (Signed)
UR completed 

## 2013-12-31 NOTE — Discharge Summary (Signed)
Family Medicine Teaching Service  Discharge Note : Attending Sara Neal MD Pager [phone] Office [phone] I have seen and examined this patient, reviewed their chart and discussed discharge planning wit the resident at the time of discharge. I agree with the discharge plan as above.  

## 2014-01-02 ENCOUNTER — Telehealth: Payer: Self-pay | Admitting: Family Medicine

## 2014-01-02 NOTE — Telephone Encounter (Signed)
Forward to PCP for review.Belinda [name]

## 2014-01-02 NOTE — Telephone Encounter (Signed)
Please call patient and let her know that her diet is NOT restricted.

## 2014-01-02 NOTE — Telephone Encounter (Signed)
Since she was discharged from the hospital, wants to know what she can eat

## 2014-01-05 ENCOUNTER — Other Ambulatory Visit: Payer: Self-pay | Admitting: Family Medicine

## 2014-01-05 NOTE — Telephone Encounter (Signed)
Pt notified.  Aijalon Kirtz L, CMA  

## 2014-01-06 ENCOUNTER — Ambulatory Visit (INDEPENDENT_AMBULATORY_CARE_PROVIDER_SITE_OTHER): Payer: Medicare Other | Admitting: Family Medicine

## 2014-01-06 ENCOUNTER — Encounter: Payer: Self-pay | Admitting: Family Medicine

## 2014-01-06 VITALS — BP 190/89 | HR 75 | Temp 98.0°F | Ht 59.0 in

## 2014-01-06 DIAGNOSIS — I10 Essential (primary) hypertension: Secondary | ICD-10-CM

## 2014-01-06 DIAGNOSIS — K5900 Constipation, unspecified: Secondary | ICD-10-CM

## 2014-01-06 DIAGNOSIS — N039 Chronic nephritic syndrome with unspecified morphologic changes: Principal | ICD-10-CM

## 2014-01-06 DIAGNOSIS — R748 Abnormal levels of other serum enzymes: Secondary | ICD-10-CM

## 2014-01-06 DIAGNOSIS — D631 Anemia in chronic kidney disease: Secondary | ICD-10-CM

## 2014-01-06 DIAGNOSIS — K922 Gastrointestinal hemorrhage, unspecified: Secondary | ICD-10-CM

## 2014-01-06 DIAGNOSIS — D649 Anemia, unspecified: Secondary | ICD-10-CM

## 2014-01-06 LAB — CMP AND LIVER
ALBUMIN: 3.6 g/dL (ref 3.5–5.2)
ALK PHOS: 211 U/L — AB (ref 39–117)
ALT: 33 U/L (ref 0–35)
AST: 42 U/L — ABNORMAL HIGH (ref 0–37)
BILIRUBIN DIRECT: 0.1 mg/dL (ref 0.0–0.3)
BUN: 40 mg/dL — ABNORMAL HIGH (ref 6–23)
CO2: 21 meq/L (ref 19–32)
Calcium: 9.7 mg/dL (ref 8.4–10.5)
Chloride: 106 mEq/L (ref 96–112)
Creat: 4.1 mg/dL — ABNORMAL HIGH (ref 0.50–1.10)
Glucose, Bld: 103 mg/dL — ABNORMAL HIGH (ref 70–99)
Indirect Bilirubin: 0.3 mg/dL (ref 0.2–1.2)
POTASSIUM: 3.7 meq/L (ref 3.5–5.3)
SODIUM: 140 meq/L (ref 135–145)
TOTAL PROTEIN: 6.5 g/dL (ref 6.0–8.3)
Total Bilirubin: 0.4 mg/dL (ref 0.2–1.2)

## 2014-01-06 LAB — POCT HEMOGLOBIN: HEMOGLOBIN: 8.8 g/dL — AB (ref 12.2–16.2)

## 2014-01-06 NOTE — Patient Instructions (Addendum)
Anemia/blood in stools - continue to take iron 3 times a day as tolerated  Diarrhea - start Miralax daily tomorrow to prevent constipation  Discuss home health at next visit.   Check liver enzymes.

## 2014-01-07 NOTE — Assessment & Plan Note (Signed)
Anemia secondary to acute GI blood loss. No further workup from GI standpoint. -Continue to monitor hemoglobin -Transfusion threshold of 8 given history of CAD

## 2014-01-07 NOTE — Assessment & Plan Note (Signed)
Patient presents for hospital followup. Discharged after second hospitalization for GI bleeding and anemia on 12/30/2013. Hemoglobin stable at 8.8 (down slightly from discharge hemoglobin of 9.2). No further workup planned from GI standpoint. -Continue daily iron supplementation, patient counseled to increase intake to 3 tablets of iron sulfate 325 mg daily -Continue to monitor hemoglobin -Patient to return to office in 3 days for recheck of hemoglobin

## 2014-01-07 NOTE — Assessment & Plan Note (Signed)
Patient had elevated liver enzymes during recent hospitalization. -Will recheck CMP and liver studies. -If elevated will need to pursue further workup

## 2014-01-07 NOTE — Assessment & Plan Note (Signed)
Blood pressure elevated today. Suspect secondary to patient having just had episode of diarrhea and soiling herself. -Patient to return to office in 3 days for recheck of blood pressure

## 2014-01-07 NOTE — Assessment & Plan Note (Signed)
Patient continues to have intermittent constipation with overflow diarrhea. -Counseled on the importance of taking daily MiraLax to prevent further episodes of constipation. -Patient was counseled that her chronic constipation may be contributing to her blood loss anemia from GI source.

## 2014-01-07 NOTE — Progress Notes (Signed)
   Subjective:    Patient ID: Belinda [name], female    DOB: 04-20-1927, 80 y.o.   MRN: [id number]  HPI 80 year old female presents for hospital discharge followup. Patient was discharge from [facility] on 12/30/2013 after admission for acute blood loss anemia.  Acute blood loss anemia-due to GI bleed, patient states that she has continued to have dark appearing stools, she has followed up with her gastroenterologist since the time of discharge and he stated that no further evaluation or workup will be completed at this time, of note patient has had previous colonoscopy that was unable to be fully completed due to diverticulosis and severe angle of her colon, she did subsequently have a barium enema completed which identified 2 small polyps in the transverse colon, patient has noted some increasing fatigue since the time of discharge, she has been attempting to take the iron on a daily basis however has missed doses over the past 2 days  Diarrhea/constipation-patient continues to have intermittent bouts of constipation and subsequent diarrhea, patient states that 5 days ago she had acute episode of constipation for which he took multiple glycerin suppositories, the patient did subsequently have multiple loose stools and has continued to have multiple loose stools per day since that time, she does report some associated crampy abdominal pain, no associated nausea or emesis, she is thought to have overflow incontinence and has not been taking her MiraLax daily as prescribed  Elevated liver enzymes-patient was identified to have elevated liver enzymes during her hospitalization which were new to unclear etiology, patient reports no right upper clinic abdominal pain or icterus  Hypertension-patient currently on Imdur 30 mg daily, Toprol 25 mg daily, nifedipine 60 mg daily. Does report occasional missed dosages due to abdominal cramping and diarrhea, no chest pain, no lightheadedness, no vision  changes  Social-patient lives alone, she requires the help of her son who lives down the road for multiple of her activities of daily living due to her acute illness, patient is possibly interested in home health, her son would like to hold off on home health for the time being however is willing to revisit the subject at a later date.   Review of Systems  Constitutional: Positive for fatigue. Negative for fever and chills.  Respiratory: Negative for shortness of breath.   Cardiovascular: Negative for chest pain and leg swelling.  Gastrointestinal: Positive for abdominal pain, diarrhea, constipation and blood in stool. Negative for nausea and vomiting.       Objective:   Physical Exam Vitals: Reviewed General: Pleasant Caucasian female, she appears in some acute distress as she had a bout of diarrhea and has soiled herself HEENT: Normocephalic, pupils are equal in size bilaterally, mild conjunctival pallor, moist mucous membranes Cardiac: Regular rate and rhythm, S1 and S2 present, no murmurs, no heaves or thrills Respiratory: Clear to auscultation bilaterally, normal effort Abdomen: Soft, minimal diffuse tenderness, bowel sounds present, no rebound, no guarding Extremities: No edema, 2+ radial pulses bilaterally       Assessment & Plan:  Please see problem specific assessment and plan.

## 2014-01-09 ENCOUNTER — Encounter: Payer: Self-pay | Admitting: Family Medicine

## 2014-01-09 ENCOUNTER — Ambulatory Visit (INDEPENDENT_AMBULATORY_CARE_PROVIDER_SITE_OTHER): Payer: Medicare Other | Admitting: Family Medicine

## 2014-01-09 VITALS — BP 158/74 | HR 64 | Temp 98.6°F | Ht 59.0 in | Wt 126.4 lb

## 2014-01-09 DIAGNOSIS — B9789 Other viral agents as the cause of diseases classified elsewhere: Secondary | ICD-10-CM

## 2014-01-09 DIAGNOSIS — K219 Gastro-esophageal reflux disease without esophagitis: Secondary | ICD-10-CM

## 2014-01-09 DIAGNOSIS — D649 Anemia, unspecified: Secondary | ICD-10-CM

## 2014-01-09 DIAGNOSIS — K5900 Constipation, unspecified: Secondary | ICD-10-CM

## 2014-01-09 DIAGNOSIS — I10 Essential (primary) hypertension: Secondary | ICD-10-CM

## 2014-01-09 DIAGNOSIS — K922 Gastrointestinal hemorrhage, unspecified: Secondary | ICD-10-CM

## 2014-01-09 DIAGNOSIS — B349 Viral infection, unspecified: Secondary | ICD-10-CM

## 2014-01-09 DIAGNOSIS — R748 Abnormal levels of other serum enzymes: Secondary | ICD-10-CM

## 2014-01-09 LAB — CBC WITH DIFFERENTIAL/PLATELET
Basophils Absolute: 0 10*3/uL (ref 0.0–0.1)
Basophils Relative: 0 % (ref 0–1)
EOS ABS: 1 10*3/uL — AB (ref 0.0–0.7)
Eosinophils Relative: 10 % — ABNORMAL HIGH (ref 0–5)
HEMATOCRIT: 27.7 % — AB (ref 36.0–46.0)
HEMOGLOBIN: 9.1 g/dL — AB (ref 12.0–15.0)
Lymphocytes Relative: 13 % (ref 12–46)
Lymphs Abs: 1.3 10*3/uL (ref 0.7–4.0)
MCH: 29.5 pg (ref 26.0–34.0)
MCHC: 32.9 g/dL (ref 30.0–36.0)
MCV: 89.9 fL (ref 78.0–100.0)
MONOS PCT: 7 % (ref 3–12)
Monocytes Absolute: 0.7 10*3/uL (ref 0.1–1.0)
Neutro Abs: 7.1 10*3/uL (ref 1.7–7.7)
Neutrophils Relative %: 70 % (ref 43–77)
Platelets: 184 10*3/uL (ref 150–400)
RBC: 3.08 MIL/uL — ABNORMAL LOW (ref 3.87–5.11)
RDW: 18.5 % — AB (ref 11.5–15.5)
WBC: 10.2 10*3/uL (ref 4.0–10.5)

## 2014-01-09 LAB — POCT HEMOGLOBIN: Hemoglobin: 9.4 g/dL — AB (ref 12.2–16.2)

## 2014-01-09 MED ORDER — ISOSORBIDE MONONITRATE ER 30 MG PO TB24: 30.0000 mg | ORAL_TABLET | Freq: Every day | ORAL | Status: DC

## 2014-01-09 MED ORDER — OMEPRAZOLE 20 MG PO CPDR: 20.0000 mg | DELAYED_RELEASE_CAPSULE | Freq: Every day | ORAL | Status: DC

## 2014-01-09 NOTE — Patient Instructions (Signed)
Diarrhea - improved, continue daily Miralax  Acid Reflux - start Prilosec daily  GI bleed - your blood counts are stable, check as needed  Elevated Liver Enzymes - generally improved, will need to monitor Alkaline Phosphatase  Keep your appointment on the 20th.

## 2014-01-09 NOTE — Assessment & Plan Note (Signed)
Patient presents with signs and symptoms of viral URI. -Conservative management with fluids and rest discussed.

## 2014-01-09 NOTE — Assessment & Plan Note (Signed)
Patient started on Prilosec 20 mg daily. At this time will hold on patient taking Carafate that was prescribed by her gastroenterologist do to poor kidney function and possible accumulation of aluminum.

## 2014-01-09 NOTE — Progress Notes (Addendum)
   Subjective:    Patient ID: Belinda [name], female    DOB: February 17, 1927, 80 y.o.   MRN: [id number]  HPI 80 year old female presents for followup of multiple medical conditions.  Acute GI blood loss anemia - patient reports decrease in bloody stools, still having occasional cramping lower bilateral quadrant abdominal pain, no nausea, no emesis, taking iron on a daily basis  Overflow incontinence-patient was recently seen and had significant loose stools and diarrhea status post enema for constipation, loose stools have been fewer since previous visit, patient continues to take MiraLax daily  Patient reports daily history of nonproductive cough as well as occasional shortness of breath, she reports associated nasal congestion, and rhinorrhea, she denies associated sore throat, no fevers, no chills, taking daily Flonase, has also attempted nasal saline rinses which provided minimal relief  Hypertension-patient returns for followup of hypertension which was uncontrolled at previous visit, patient currently on Imdur 30 mg daily, Toprol XL 25 mg daily, and nifedipine 60 mg daily, reports no missed dosages, no associated chest pain, no headache  Acid reflux-patient previously on Zantac and Protonix, recently prescribed Carafate by her GI physician, patient's son questions whether this is safe given his mother's chronic kidney disease, patient continues to have daily burping and acid reflux  Elevated liver enzymes-patient repeat lab testing and results are reviewed in the objective section, patient denies right upper quadrant abdominal pain, she is status post previous cholecystectomy  Social-nonsmoker   Review of Systems  Constitutional: Positive for fatigue. Negative for fever and chills.  HENT: Positive for congestion, postnasal drip, rhinorrhea and sore throat. Negative for ear pain.   Respiratory: Negative for chest tightness.   Cardiovascular: Negative for chest pain.  Gastrointestinal: Positive  for abdominal pain, diarrhea, constipation and blood in stool. Negative for nausea, vomiting, abdominal distention and rectal pain.       Objective:   Physical Exam Vitals: Reviewed General: Caucasian female, sitting in wheelchair, accompanied by her son HEENT: Normocephalic, pupils equal in size bilaterally, no scleral icterus, mild rhinorrhea, moist mucous membranes, neck was supple, no adenopathy Cardiac: Regular rate and rhythm, S1 and S2 present, no murmurs, no heaves or thrills Respiratory: Clear to auscultation bilaterally, normal effort Abdomen: Soft, mild bilateral lower quadrant tenderness, bowel sounds normal no quadrants, no rebound, no guarding MSK: Patient sitting in wheelchair Skin: No rash Extremities: No edema, 2+ radial pulses bilaterally  Complete metabolic panel performed on 01/06/2014 showed sodium of 140, potassium of 3.7, chloride 106, bicarbonate 21, BUN 40, creatinine 4.0, calcium of 9.5, alkaline phosphatase of 211, AST of 42, ALT of 33, total protein is 6.5, albumin of 3.6, total bilirubin 0.4  Note that AST and ALT are markedly improved from previous  Patient underwent CT abdomen and pelvis 12/29/2013 which identified colonic diverticulosis, patient has no evidence of intrahepatic or extrahepatic biliary dilatation, patient status post cholecystectomy, no hepatic lesions noted     Assessment & Plan:  Please see problem specific assessment and plan.

## 2014-01-09 NOTE — Assessment & Plan Note (Signed)
Improved on daily MiraLax. Overflow incontinence improved from previous visit

## 2014-01-09 NOTE — Assessment & Plan Note (Signed)
Hemoglobin stable. Monitor hemoglobin 9.4 today. Continue to monitor hemoglobin periodically

## 2014-01-09 NOTE — Assessment & Plan Note (Signed)
AST and ALT are markedly improved from previous examination. Alkaline phosphatase remains elevated at 211. Patient denies right upper quadrant pain consistent with hepatobiliary disease. Recent CT scan on 12/29/2013 showed no evidence of biliary dilatation and no evidence of hepatic mass. It is unclear what the exact etiology of the elevated alkaline phosphatase is at this time. Patient will have repeat alkaline phosphatase level drawn in 2 weeks. If remains elevated will pursue workup for possible bone etiology.   Recent calcium within normal limits. Could consider checking PTH and vitamin D levels if alkaline phosphatase remains elevated

## 2014-01-09 NOTE — Assessment & Plan Note (Signed)
Markedly improved from previous visit. Continue current blood pressure medications. Refill provided for Imdur.

## 2014-01-23 ENCOUNTER — Ambulatory Visit (INDEPENDENT_AMBULATORY_CARE_PROVIDER_SITE_OTHER): Payer: Medicare Other | Admitting: Family Medicine

## 2014-01-23 ENCOUNTER — Encounter: Payer: Self-pay | Admitting: Family Medicine

## 2014-01-23 VITALS — BP 200/60 | HR 85 | Ht 59.0 in | Wt 132.5 lb

## 2014-01-23 DIAGNOSIS — I10 Essential (primary) hypertension: Secondary | ICD-10-CM

## 2014-01-23 DIAGNOSIS — R748 Abnormal levels of other serum enzymes: Secondary | ICD-10-CM

## 2014-01-23 DIAGNOSIS — K5900 Constipation, unspecified: Secondary | ICD-10-CM

## 2014-01-23 DIAGNOSIS — D649 Anemia, unspecified: Secondary | ICD-10-CM

## 2014-01-23 LAB — CMP AND LIVER
ALT: 17 U/L (ref 0–35)
AST: 31 U/L (ref 0–37)
Albumin: 3.6 g/dL (ref 3.5–5.2)
Alkaline Phosphatase: 173 U/L — ABNORMAL HIGH (ref 39–117)
BILIRUBIN DIRECT: 0.1 mg/dL (ref 0.0–0.3)
BILIRUBIN INDIRECT: 0.3 mg/dL (ref 0.2–1.2)
BUN: 40 mg/dL — ABNORMAL HIGH (ref 6–23)
CALCIUM: 8.9 mg/dL (ref 8.4–10.5)
CHLORIDE: 111 meq/L (ref 96–112)
CO2: 21 mEq/L (ref 19–32)
Creat: 3.89 mg/dL — ABNORMAL HIGH (ref 0.50–1.10)
GLUCOSE: 116 mg/dL — AB (ref 70–99)
Potassium: 4.4 mEq/L (ref 3.5–5.3)
Sodium: 142 mEq/L (ref 135–145)
Total Bilirubin: 0.4 mg/dL (ref 0.2–1.2)
Total Protein: 6 g/dL (ref 6.0–8.3)

## 2014-01-23 MED ORDER — NIFEDIPINE ER 30 MG PO TB24: 90.0000 mg | ORAL_TABLET | Freq: Every day | ORAL | Status: DC

## 2014-01-23 NOTE — Patient Instructions (Signed)
High Blood pressure - start Nifedipine 90 mg (3 30 mg tablets daily), check blood pressure over the weekend and call Dr. Ree Kida with results Monday morning  Low Iron/stomach bleed - appears stable, continue iron  Constipation/diarrhea - continue Miralax daily  Elevated Alkaline Phosphatase - check lab work

## 2014-01-23 NOTE — Assessment & Plan Note (Signed)
Hemoglobin stable based on most recent lab work. Continue to monitor intermittently.

## 2014-01-23 NOTE — Assessment & Plan Note (Signed)
The pressure markedly elevated today. No acute evidence of end organ damage based on history and examination. -Patient to check blood pressure at home over the next 3 days and 2: To office -Increase nifedipine XL to 90 mg daily -Continue current dose of Toprol and Imdur

## 2014-01-23 NOTE — Progress Notes (Signed)
   Subjective:    Patient ID: Belinda [name], female    DOB: Sep 12, 1927, 80 y.o.   MRN: [id number]  HPI 80 year old female presents for followup of multiple medical conditions. She is accompanied by her son.  Anemia secondary to GI bleed-patient has noticed decreased bloody stools, abdominal pain is improved, she was tolerating diet daily iron supplementation  CKD 5-patient had recent appointment with nephrologist Dr. Joelyn Oms  Multiple joint pain/osteoarthritis - patient follows with Raliegh Ip  Overflow incontinence-patient had diarrhea for approximately 3 days earlier in the week however symptoms have improved, currently having regular bowel movements, taking MiraLax daily, no nausea, no emesis  Elevated liver enzymes-patient due for recheck of liver enzymes to evaluate alkaline phosphatase, no abdominal pain at this time, patient reports previous DEXA scan  Hypertension-patient currently on nifedipine, metoprolol, and Imdur. Denies headache, denies chest pain, has chronic vision changes related to diabetic retinopathy  Social-patient lives alone, relies on son for help with medications.   Review of Systems  Constitutional: Negative for fever, chills and fatigue.  Respiratory: Negative for shortness of breath and stridor.   Cardiovascular: Negative for chest pain and leg swelling.  Gastrointestinal: Negative for nausea, vomiting, abdominal pain, diarrhea, constipation and abdominal distention.       Objective:   Physical Exam Vitals: Reviewed General: Pleasant Caucasian female, more energetic today than on previous examinations, smiling Cardiac: Regular in rhythm, 2/6 systolic murmur, no heaves, no thrill Respiratory: Clear to auscultation bilaterally, normal effort Abdomen: Soft, nontender, bowel sounds present, no organomegaly Extremities: 2+ radial pulses bilaterally, no pedal edema bilaterally       Assessment & Plan:  Please see problem specific assessment and plan.

## 2014-01-23 NOTE — Assessment & Plan Note (Signed)
Patient due for recheck of alkaline phosphatase. Will also check PTH and vitamin D level to further evaluate for bone etiology of elevated alkaline phosphatase.

## 2014-01-23 NOTE — Assessment & Plan Note (Signed)
Stable on daily MiraLax, patient continues to have intermittent bouts of overflow incontinence

## 2014-01-24 LAB — VITAMIN D 25 HYDROXY (VIT D DEFICIENCY, FRACTURES): Vit D, 25-Hydroxy: 10 ng/mL — ABNORMAL LOW (ref 30–89)

## 2014-01-26 ENCOUNTER — Telehealth: Payer: Self-pay | Admitting: Family Medicine

## 2014-01-26 DIAGNOSIS — E559 Vitamin D deficiency, unspecified: Secondary | ICD-10-CM

## 2014-01-26 MED ORDER — VITAMIN D (ERGOCALCIFEROL) 1.25 MG (50000 UNIT) PO CAPS: 50000.0000 [IU] | ORAL_CAPSULE | ORAL | Status: DC

## 2014-01-26 NOTE — Telephone Encounter (Signed)
Son brought in home blood pressures: ranging 167-177/57-65, told Belinda [name] to continue increased dose of Nifedipine and return to office in 2 weeks for recheck of blood pressure.  Informed her of lab work. Liver enzymes improved. Low vitamin D - will start vit. D supplementation, PTH not sent out by lab, consider repeating with next lab draw  Patient having URI symptoms, cough/congestion/wheezing, taking throat lozenges and inhaler as needed, patient has appointment tomorrow for follow up

## 2014-01-26 NOTE — Assessment & Plan Note (Signed)
Vitamin D level of 10. Start Vitamin D supplementation.

## 2014-01-27 ENCOUNTER — Ambulatory Visit: Payer: Medicare Other | Admitting: Family Medicine

## 2014-02-02 ENCOUNTER — Telehealth: Payer: Self-pay | Admitting: Family Medicine

## 2014-02-02 NOTE — Telephone Encounter (Signed)
Returned call to pt, pt stated for the pass 2-3 nights she can't sleep and having trouble breathing out of her left nostril.  She is starting to have headaches, small amount of bleeding from right nostril.  Pt has used Flonase and stated she does not think it is helping.  Offered appt for today and tomorrow but stated she does not want come in, she just feels bad and son is not home to bring her in.  Pt has seen an ENT doctor and prescribed antibiotics to take, per pt she can't take all that medication.  Please advise.  Derl Barrow, RN

## 2014-02-02 NOTE — Telephone Encounter (Signed)
Pt called and wanted to talk to a nurse to see if she needs to be seen. She is having trouble sleeping, and having issues catching her breath at night. jw

## 2014-02-03 NOTE — Telephone Encounter (Signed)
Patient continues to have nasal congestion, symptoms mildly improved with humidified air and breathe rite strips, she is starting to lose her voice today. Discussed continued conservative management with fluids, breathe rite strips, and humidified air.

## 2014-02-09 ENCOUNTER — Encounter: Payer: Self-pay | Admitting: Family Medicine

## 2014-02-09 ENCOUNTER — Ambulatory Visit (INDEPENDENT_AMBULATORY_CARE_PROVIDER_SITE_OTHER): Payer: Medicare Other | Admitting: Family Medicine

## 2014-02-09 VITALS — BP 168/64 | HR 78 | Temp 97.9°F | Ht 59.0 in | Wt 132.0 lb

## 2014-02-09 DIAGNOSIS — I10 Essential (primary) hypertension: Secondary | ICD-10-CM

## 2014-02-09 DIAGNOSIS — J3489 Other specified disorders of nose and nasal sinuses: Secondary | ICD-10-CM

## 2014-02-09 DIAGNOSIS — R0981 Nasal congestion: Secondary | ICD-10-CM

## 2014-02-09 NOTE — Patient Instructions (Signed)
Nasal Congestion/Runny nose - continue Flonase daily, take over the counter Claritin D for the next 2 weeks  High Blood Pressure - no changes today, follow up in 1 month

## 2014-02-09 NOTE — Assessment & Plan Note (Signed)
One month history of nasal congestion/rhinnorhea/postnasal drip likely due to recurrent viral illness. Complicated by history of allergic rhinitis. -Patient to continue daily Flonase -Counseled her to restart Zyrtec (states that she still has at home) or start otc Claritin D (counseled on short course as decongestant may rise blood pressure).

## 2014-02-09 NOTE — Progress Notes (Signed)
   Subjective:    Patient ID: [name], female    DOB: 1927/09/27, 80 y.o.   MRN: [id number]  HPI 80 y/o female presents for follow up of HTN.  HTN - Nifedipine XL increased at last visit to 90 mg daily, patient tolerating well, no reported side effects, continues to also take Imdur and Metoprolol. No headaches, no vision changes, no chest pain.  Nasal congestion - patient continues to have nasal congestion/rhinorrhea and chest congestion, symptoms have been present for close to one month, she reports occasional shortness of breath, symptoms are resolved by placing Vicks in her nose, she also occasional uses her Albuterol which provides mile relief of her symptoms, patient denies productive cough, she was previously on Zyrtec however has not been taking on a regular basis, no fevers, no chills, no sinus pain, she also reports taking Flonase daily   Review of Systems  Constitutional: Negative for fever and chills.  HENT: Positive for congestion, rhinorrhea and sore throat. Negative for sinus pressure.   Respiratory: Positive for shortness of breath. Negative for choking and chest tightness.   Gastrointestinal: Positive for nausea, diarrhea and constipation. Negative for abdominal pain and abdominal distention.       Objective:   Physical Exam Vitals: reviewed, SpO2 97% on room air Gen: pleasant female, accompanied by her son, NAD, speaking in full sentences HEENT: normocephalic, PERRL, EOMI, no scleral icterus, mild conjunctival pallor, rhinorrhea present, mild bogginess of the nasal turbinates, MMM, postnasal drip present, no pharyngeal exudate, neck supple, no cervical adenopathy Cardiac: RRR, S1 and S2 present, no murmurs, no heaves/thrills Resp: CTAB, normal effort       Assessment & Plan:  Please see problem specific assessment and plan.

## 2014-02-09 NOTE — Assessment & Plan Note (Signed)
Improved from previous, Still not at goal of 150/90. No physical signs/symptoms of end organ damage.  -Due to significant improvement will not take any additional changes today (want to avoid orthostatic hypotension). -Return in one month, if still elevated will titrate medictions

## 2014-02-17 ENCOUNTER — Other Ambulatory Visit: Payer: Self-pay | Admitting: Family Medicine

## 2014-02-23 ENCOUNTER — Encounter: Payer: Self-pay | Admitting: Family Medicine

## 2014-02-23 ENCOUNTER — Ambulatory Visit (INDEPENDENT_AMBULATORY_CARE_PROVIDER_SITE_OTHER): Payer: Medicare Other | Admitting: Family Medicine

## 2014-02-23 VITALS — BP 171/69 | HR 73 | Temp 97.8°F | Ht 59.0 in | Wt 131.7 lb

## 2014-02-23 DIAGNOSIS — I10 Essential (primary) hypertension: Secondary | ICD-10-CM

## 2014-02-23 DIAGNOSIS — D649 Anemia, unspecified: Secondary | ICD-10-CM

## 2014-02-23 DIAGNOSIS — J309 Allergic rhinitis, unspecified: Secondary | ICD-10-CM

## 2014-02-23 DIAGNOSIS — R748 Abnormal levels of other serum enzymes: Secondary | ICD-10-CM

## 2014-02-23 LAB — CBC
HCT: 28.4 % — ABNORMAL LOW (ref 36.0–46.0)
Hemoglobin: 9.4 g/dL — ABNORMAL LOW (ref 12.0–15.0)
MCH: 31.4 pg (ref 26.0–34.0)
MCHC: 33.1 g/dL (ref 30.0–36.0)
MCV: 95 fL (ref 78.0–100.0)
PLATELETS: 174 10*3/uL (ref 150–400)
RBC: 2.99 MIL/uL — AB (ref 3.87–5.11)
RDW: 19.1 % — ABNORMAL HIGH (ref 11.5–15.5)
WBC: 10.6 10*3/uL — ABNORMAL HIGH (ref 4.0–10.5)

## 2014-02-23 MED ORDER — ALLOPURINOL 100 MG PO TABS: 100.0000 mg | ORAL_TABLET | Freq: Every day | ORAL | Status: DC

## 2014-02-23 MED ORDER — METOPROLOL SUCCINATE ER 50 MG PO TB24: 25.0000 mg | ORAL_TABLET | Freq: Every day | ORAL | Status: DC

## 2014-02-23 NOTE — Progress Notes (Signed)
   Subjective:    Patient ID: Belinda [name], female    DOB: 03/18/1927, 80 y.o.   MRN: [id number]  HPI 80 y/o female with PMH of allergic rhinitis presents for evaluation of 3-4 day history of cough, associated with throat/chest congestion, has also had recent sneezing which was improved with Zyrtec, she reports daily rhinorrhea, takes Flonase daily, no fevers, no chills, intermittent sob that is chronic, she also take albuterol as needed, recent wheezing improved with this medication  Patient has a history of anemia due to acute blood loss, has a history of colon polyps, patient requests repeat HBG today, denies melena  HTN - currently on Toprol XL 25 mg daily, Procardia 30 mg TID, and Imdur 30 mg daily, report compliance, no orthostasis, no chest pain  Review of Systems  Constitutional: Negative for fever, chills and fatigue.  HENT: Positive for congestion, postnasal drip and rhinorrhea.   Respiratory: Positive for cough.   Cardiovascular: Negative for chest pain.   She reports decreased urination over the past few day, only urinates 2-3 times per day, no dysuria, no abdominal pain    Objective:   Physical Exam Vitals: reviewed Gen: pleasant caucasian female, accompanied by her son HEENT: normocephalic, bilateral TM's pearly grey with no erythema or bulging, PERRL, EOMI, no scleral icterus, nasal septum midline, no rhinorrhea, MMM, no pharyngeal erythema or exudate, no anterior or posterior cervical lymphadenopathy Cardiac: RRR, S1 and S2 present, no murmurs, no heaves/thrills Resp: CTAB, normal effort Abd: soft, no tenderness  Reviewed CXR from 12/2013 which was negative for acute process.     Assessment & Plan:  Please see problem specific assessment and plan.

## 2014-02-23 NOTE — Patient Instructions (Signed)
Sneezing/cough - continue Zyrtec and Flonase  Anemia - check blood work  Kidney Disease - follow up with your Nephrologist  Hypertension - increase Toprol to 50 mg daily, follow up on blood pressure at next appointment  Will discuss decreased appetite at next visit.

## 2014-02-23 NOTE — Assessment & Plan Note (Signed)
BP remains elevated -Increase Toprol to 50 mg daily

## 2014-02-23 NOTE — Assessment & Plan Note (Signed)
Patient reports recent cough and sneezing which are likely due to allergic rhinitis. She may overlying viral illness. -patient counseled to continue home Zyrtec and Flonase -no further workup planned at this time.

## 2014-02-23 NOTE — Assessment & Plan Note (Signed)
Patient request repeat Hemoglobin level, no further melena. -will check today

## 2014-02-24 ENCOUNTER — Ambulatory Visit: Payer: Medicare Other | Admitting: Family Medicine

## 2014-02-24 ENCOUNTER — Encounter: Payer: Self-pay | Admitting: Family Medicine

## 2014-02-24 LAB — PTH, INTACT AND CALCIUM
Calcium: 9.6 mg/dL (ref 8.4–10.5)
PTH: 201.5 pg/mL — AB (ref 14.0–72.0)

## 2014-03-10 ENCOUNTER — Encounter: Payer: Self-pay | Admitting: Family Medicine

## 2014-03-10 ENCOUNTER — Ambulatory Visit (INDEPENDENT_AMBULATORY_CARE_PROVIDER_SITE_OTHER): Payer: Medicare Other | Admitting: Family Medicine

## 2014-03-10 VITALS — BP 166/69 | HR 70 | Ht 59.0 in | Wt 129.0 lb

## 2014-03-10 DIAGNOSIS — I10 Essential (primary) hypertension: Secondary | ICD-10-CM

## 2014-03-10 DIAGNOSIS — R630 Anorexia: Secondary | ICD-10-CM

## 2014-03-10 DIAGNOSIS — R197 Diarrhea, unspecified: Secondary | ICD-10-CM

## 2014-03-10 NOTE — Patient Instructions (Signed)
Diarrhea - stop Miralax, continue Metamucil  High Blood Pressure - continue current medications, no changes today  Return to office in 1-2 months.

## 2014-03-11 ENCOUNTER — Other Ambulatory Visit: Payer: Self-pay | Admitting: Family Medicine

## 2014-03-11 DIAGNOSIS — R197 Diarrhea, unspecified: Secondary | ICD-10-CM | POA: Insufficient documentation

## 2014-03-11 DIAGNOSIS — R630 Anorexia: Secondary | ICD-10-CM | POA: Insufficient documentation

## 2014-03-11 MED ORDER — METOPROLOL SUCCINATE ER 50 MG PO TB24: 25.0000 mg | ORAL_TABLET | Freq: Every day | ORAL | Status: DC

## 2014-03-11 NOTE — Assessment & Plan Note (Signed)
Blood pressure elevated however improved from previously.  -Continue current regimen of Toprol 25 mg daily, Imdur 30 mg daily, and Procardia 90 mg daily.

## 2014-03-11 NOTE — Assessment & Plan Note (Signed)
Chronic loss of appetite/nausea. GI workup unremarkable (except for two small colonic polyps). Suspect related to Irritable Bowel Syndrom/Somatic complaint. Weight has been stable. No other concerning signs/symptoms for malignancy.  -encouraged PO intake and Boost -Discuss anxiety/depression at future visits -consider addition of Remeron at next visit.

## 2014-03-11 NOTE — Progress Notes (Signed)
   Subjective:    Patient ID: Belinda [name], female    DOB: 10/21/1927, 80 y.o.   MRN: [id number]  HPI 80 y/o female presents for routine visit. She is accompanied by her son.  Diarrhea - patient has had worsening diarrhea over the past few day, this is a chronic issue for her, thought to be due to overflow incontinence vs IBS, patient has been taking Miralax once daily however stopped when she developed severe diarrhea, improved with immodium X1, no further diarrhea today, she also recently started taking Metamucil wafers  Nausea/Decreased appetite - this has been chronic for the past months to years, she has had normal Upper GI studies as described below, no emesis, taking Omeprazole for history of GERD, son states that she ate "a large bowel of cherry cobbler for easter". No current abdominal pain.   Hypertension - currently taking Imdur 30 mg daily, Toprol 25 mg daily (had been told to take 50 mg daily at last visit however she was unable to tolerate due to "heart palpitations", these resolved after returning to current dose), and Procardia 90 mg daily, no chest pain, no recent vision changes, no headaches   Review of Systems  Constitutional: Negative for fever, chills and fatigue.  HENT: Positive for postnasal drip and rhinorrhea.   Respiratory: Negative for cough, choking and shortness of breath.   Cardiovascular: Negative for chest pain.  Gastrointestinal: Positive for nausea, diarrhea and constipation. Negative for vomiting and abdominal distention.       Objective:   Physical Exam Vitals: reviewed Gen: well appearing caucasian female, accompanied by her son Cardiac: RRR, S1 and S2 present, no murmurs, no heaves/thrills Resp: CTAB, normal effort Abd: soft, no tenderness, normal bowel sounds Ext: trace LE edema bilaterally  12/2013 Endoscopy - lower esophogeal ring, otherwise normal exam 12/2013 Colonoscopy - diverticulosis, unable to proceed past the sigmoid colon due to  diverticulosis and sharp angulation 12/2013 Barium enema - 2 small transverse colons polyps 12/2013 CT Abd/Pelvis - colonic diverticulosis, s/p cholecystectomy, left nephrectomy, and hysterectomy    Assessment & Plan:  Please see problem specific assessment and plan.

## 2014-03-11 NOTE — Assessment & Plan Note (Signed)
Due to overflow incontinence vs IBS. Previous infectious workup negative. Chronic in nature. Symptoms improved since stopping Miralax and taking Imodium X1 -stop Miralax during acute diarrhea -continue daly Metamucil -Only take Imodium if having severe symtoms

## 2014-03-31 ENCOUNTER — Ambulatory Visit (INDEPENDENT_AMBULATORY_CARE_PROVIDER_SITE_OTHER): Payer: Medicare Other | Admitting: Family Medicine

## 2014-03-31 ENCOUNTER — Encounter: Payer: Self-pay | Admitting: Family Medicine

## 2014-03-31 VITALS — BP 155/68 | HR 78 | Temp 98.0°F | Ht 59.0 in | Wt 129.0 lb

## 2014-03-31 DIAGNOSIS — H9190 Unspecified hearing loss, unspecified ear: Secondary | ICD-10-CM

## 2014-03-31 DIAGNOSIS — J209 Acute bronchitis, unspecified: Secondary | ICD-10-CM

## 2014-03-31 DIAGNOSIS — I10 Essential (primary) hypertension: Secondary | ICD-10-CM

## 2014-03-31 MED ORDER — AZITHROMYCIN 250 MG PO TABS: ORAL_TABLET | ORAL | Status: DC

## 2014-03-31 MED ORDER — PREDNISONE 20 MG PO TABS: 20.0000 mg | ORAL_TABLET | Freq: Two times a day (BID) | ORAL | Status: DC

## 2014-03-31 NOTE — Patient Instructions (Signed)
I think that you have acute bronchitis, will start prednisone and Z-pack. Return if symptoms not improved in 5 days.

## 2014-04-01 ENCOUNTER — Other Ambulatory Visit: Payer: Self-pay | Admitting: Family Medicine

## 2014-04-01 ENCOUNTER — Telehealth: Payer: Self-pay | Admitting: Family Medicine

## 2014-04-01 MED ORDER — TRAMADOL HCL 50 MG PO TABS: 50.0000 mg | ORAL_TABLET | Freq: Two times a day (BID) | ORAL | Status: DC | PRN

## 2014-04-01 NOTE — Telephone Encounter (Signed)
Note to nursing staff - please call in Tramadol 50 mg TID PRN Pain, dispense #30, refill #0, thanks

## 2014-04-01 NOTE — Assessment & Plan Note (Signed)
Acute bronchitis due to viral infection. -start Z-pack -Start 5 day course of Prednisone -Schedule Albuterol every 6 hours for the next 48 hours -Return precautions given

## 2014-04-01 NOTE — Progress Notes (Signed)
   Subjective:    Patient ID: Belinda [name], female    DOB: 1927-06-18, 80 y.o.   MRN: [id number]  HPI 80 y/o female presents with one week history of cough, some productive sputum, associated runny nose/congeston/sore throat, no fevers or chills, minimal improvement with Albuterol, has continued Flonase which has not helped her symptoms, mild sob, no chest pain, no palpitations  HTN - taking Imdur/Procardia as prescribed, no side effects, no chest pain, no headaches, no vision changes  Hearing Loss - has been present for many years, worse over the past few months, previously declined referral to audiologist, mild tinnitus in bilateral ears, no associated dizziness/vertigo, she is interested in hearing aides at this time  Social - lives alone, relies on her son for transportation, never a smoker   Review of Systems  Constitutional: Positive for fatigue. Negative for fever and chills.  HENT: Positive for congestion, sneezing and sore throat.   Respiratory: Positive for cough, shortness of breath and wheezing. Negative for chest tightness.   Cardiovascular: Negative for chest pain.  Gastrointestinal: Positive for diarrhea. Negative for nausea and vomiting.       Objective:   Physical Exam Vitals: reviewed Gen: pleasant caucasian female, NAD, accompanied by son HEENT: normocephalic, PERRL, EOMI, bilaterally TM's obscured by cerumen (not completed blocked), PERRL, EOMI, no scleral icterus, mild nasal rhinorrhea, MMM, uvula midline, no pharyngeal erythema or exudate, neck supple, no cervical lymphadenopaty Cardiac: RRR, S1 and S2 present, no murmurs, no heaves/thrills Resp: scattered wheezes, good air entry in all lung fields, intermittent cough  Abd: soft, no tenderness      Assessment & Plan:  Please see problem specific assessment and plan.

## 2014-04-01 NOTE — Assessment & Plan Note (Signed)
Hearing loss (likely sensorineural vs presbycusis), no cerumen impaction, no red flag signs/symtoms suggestive of intracranial mass -referral to Audiology made for formal hearing testing.

## 2014-04-01 NOTE — Telephone Encounter (Signed)
[place]

## 2014-04-01 NOTE — Assessment & Plan Note (Signed)
Under better control today. Continue current regimen.

## 2014-04-03 ENCOUNTER — Telehealth: Payer: Self-pay | Admitting: Family Medicine

## 2014-04-03 NOTE — Telephone Encounter (Signed)
Patient recently dx with acute bronchitis. Prednisone has made patient very shaky and she is not eating very well. Would like to speak to MD/nurse about this. Please call cell phone if she does not answer home.

## 2014-04-06 ENCOUNTER — Ambulatory Visit (INDEPENDENT_AMBULATORY_CARE_PROVIDER_SITE_OTHER): Payer: Medicare Other | Admitting: Family Medicine

## 2014-04-06 ENCOUNTER — Encounter: Payer: Self-pay | Admitting: Family Medicine

## 2014-04-06 ENCOUNTER — Ambulatory Visit (HOSPITAL_COMMUNITY)
Admission: RE | Admit: 2014-04-06 | Discharge: 2014-04-06 | Disposition: A | Discharge status: Home / Self Care | Payer: Medicare Other | Source: Ambulatory Visit | Attending: Family Medicine | Admitting: Family Medicine

## 2014-04-06 VITALS — BP 145/63 | HR 68 | Temp 97.9°F | Ht 59.0 in | Wt 129.0 lb

## 2014-04-06 DIAGNOSIS — R05 Cough: Secondary | ICD-10-CM | POA: Insufficient documentation

## 2014-04-06 DIAGNOSIS — R0600 Dyspnea, unspecified: Secondary | ICD-10-CM

## 2014-04-06 DIAGNOSIS — J40 Bronchitis, not specified as acute or chronic: Secondary | ICD-10-CM | POA: Insufficient documentation

## 2014-04-06 DIAGNOSIS — J209 Acute bronchitis, unspecified: Secondary | ICD-10-CM

## 2014-04-06 DIAGNOSIS — R0989 Other specified symptoms and signs involving the circulatory and respiratory systems: Secondary | ICD-10-CM | POA: Insufficient documentation

## 2014-04-06 DIAGNOSIS — R0609 Other forms of dyspnea: Secondary | ICD-10-CM | POA: Insufficient documentation

## 2014-04-06 DIAGNOSIS — R059 Cough, unspecified: Secondary | ICD-10-CM | POA: Insufficient documentation

## 2014-04-06 DIAGNOSIS — I10 Essential (primary) hypertension: Secondary | ICD-10-CM

## 2014-04-06 MED ORDER — PREDNISONE 20 MG PO TABS: 40.0000 mg | ORAL_TABLET | Freq: Two times a day (BID) | ORAL | Status: DC

## 2014-04-06 NOTE — Progress Notes (Signed)
Family Medicine Office Visit Note   Subjective:   Patient ID: Belinda [name], female  DOB: 06-18-1927, 80 y.o.. MRN: [id number]   Pt that comes today for same day appointment complaining of difficulty breathing. She was seen last Tuesday with acute bronchitis and completed 4 days of Prednisone and 3 of Azithromycin. She reports on Friday felt a little better and did not come for re-evaluation. Yesterday she reports had more dyspnea and cough that now is productive with yellow sputum production. Denies  fevers but reports chills. No general malaise and appetite is normal. She has been using albuterol only occasional PRN. Denies LE swelling, palpitations or chest pain.  Review of Systems:  Per HPI  Objective:   Physical Exam: Gen:  NAD HEENT: Moist mucous membranes  CV: Regular rate and rhythm, no murmurs rubs or gallops PULM: Clear to auscultation bilaterally. Moderated wheezes on both lung fileds, no rales.  ABD: Soft, non tender, non distended, normal bowel sounds EXT: No edema Neuro: Alert and oriented x3. No focalization  Assessment & Plan:

## 2014-04-06 NOTE — Assessment & Plan Note (Addendum)
CXR is negative, O2 sats are wnl. Last ECHO in 2015 with normal EF and grade 2 diastolic.  No signs of fluid overload. Stable weight. Less likely cardiovascular the cause of her dyspnea.  Will continue prednisone in a slow taper and scheduled Albuterol. Recommend to f/u in 5 days or sooner if needed.

## 2014-04-06 NOTE — Patient Instructions (Addendum)
It has been a pleasure to see you today.  Please take the medications as prescribed. Prednisone this time start 2 tab daily for 5 days , then 1 tab daily for 3 days and 1/2 tab daily for 3 more days and discontinue. Albuterol 2 puffs every 4-6 hours then as needed for SOB

## 2014-04-06 NOTE — Assessment & Plan Note (Signed)
Did not take her medications today.

## 2014-04-06 NOTE — Telephone Encounter (Signed)
Pt has appt with Dr.Piloto today. Belinda [name]

## 2014-04-08 ENCOUNTER — Telehealth: Payer: Self-pay | Admitting: Family Medicine

## 2014-04-08 NOTE — Telephone Encounter (Signed)
Relayed message,she states she ok but not completely better ,advised to schedule earlier follow up if not better by tomorrow.she voiced understanding.[place]

## 2014-04-08 NOTE — Telephone Encounter (Signed)
Called Ms. Santiesteban to inform about her chest xray results which is normal, but no able to establish communication.  No changes on her regimen are recommended. F/u as discussed. Will route this note to nurse in order to inform pt.

## 2014-04-13 ENCOUNTER — Ambulatory Visit (INDEPENDENT_AMBULATORY_CARE_PROVIDER_SITE_OTHER): Payer: Medicare Other | Admitting: Family Medicine

## 2014-04-13 ENCOUNTER — Encounter: Payer: Self-pay | Admitting: Family Medicine

## 2014-04-13 VITALS — BP 174/55 | HR 87 | Temp 97.9°F | Ht 59.0 in | Wt 142.0 lb

## 2014-04-13 DIAGNOSIS — R5383 Other fatigue: Secondary | ICD-10-CM

## 2014-04-13 DIAGNOSIS — R5381 Other malaise: Secondary | ICD-10-CM

## 2014-04-13 DIAGNOSIS — K649 Unspecified hemorrhoids: Secondary | ICD-10-CM | POA: Insufficient documentation

## 2014-04-13 DIAGNOSIS — J209 Acute bronchitis, unspecified: Secondary | ICD-10-CM

## 2014-04-13 LAB — POCT HEMOGLOBIN: Hemoglobin: 9.4 g/dL — AB (ref 12.2–16.2)

## 2014-04-13 MED ORDER — HYDROCORTISONE 2.5 % RE CREA: 1.0000 "application " | TOPICAL_CREAM | Freq: Two times a day (BID) | RECTAL | Status: DC

## 2014-04-13 MED ORDER — ALBUTEROL SULFATE HFA 108 (90 BASE) MCG/ACT IN AERS: 2.0000 | INHALATION_SPRAY | Freq: Four times a day (QID) | RESPIRATORY_TRACT | Status: DC | PRN

## 2014-04-13 MED ORDER — FUROSEMIDE 20 MG PO TABS: 20.0000 mg | ORAL_TABLET | Freq: Every day | ORAL | Status: DC

## 2014-04-13 NOTE — Patient Instructions (Addendum)
Cough/sinus symptoms - please start OTC Mucinex (take as prescribed on the bottle). Continue Flonase.   Hemorrhoids - start Anusol for your hemorrhoids

## 2014-04-14 NOTE — Progress Notes (Signed)
   Subjective:    Patient ID: Belinda [name], female    DOB: 02/28/1927, 80 y.o.   MRN: [id number]  HPI 80 year old female presents for followup of congestion and shortness of breath. Patient was most recently seen on 04/06/2014 for evaluation of dyspnea. Patient has been evaluated multiple times over the past few weeks for similar symptoms including nasal congestion, mildly productive cough, and subjective shortness of breath. Patient initially responded well to Z-Pak and prednisone. Patient had return of her symptoms after completion of the initial Z-Pak. At the visit on 04/06/2014 she was given a steroid taper and a chest x-ray was completed as outlined below. Patient reports no improvement of her symptoms since this time. She continues daily Flonase to treat her allergic rhinitis. She is also been using her albuterol as needed which has provided minimal relief of her symptoms. Patient also reports increasing lower extremity swelling since starting the prednisone. She also reports worsening of her vision since starting the prednisone.  Patient also like to have her bottom examined for possible hemorrhoids, she reports pain with stooling, no frank blood in her stool. She recently discontinued her daily MiraLAX as she reports regular bowel movements.   Review of Systems  Constitutional: Negative for fever, chills and fatigue.  HENT: Positive for congestion. Negative for rhinorrhea.   Respiratory: Positive for cough and shortness of breath.   Gastrointestinal: Negative for nausea and diarrhea.       Objective:   Physical Exam Vitals: Reviewed General: Pleasant Caucasian female, accompanied by her son, mild distress HEENT: Normocephalic, pupils are equal round and reactive to light, extraocular movements are intact, nasal septum midline, no rhinorrhea, moist mucous members, uvula midline, neck was supple, no anterior posterior cervical lymphadenopathy Cardiac: Regular rate and rhythm, S1 and S2  present, no murmurs, no heaves or thrills Respiratory: Clear to patient bilaterally, normal effort Extremities: 1+ bilateral lower extremity edema up to the mid shin Rectal: Multiple small external hemorrhoids without evidence of thrombosis, good rectal tone, stool in vault, no internal hemorrhoids palpated   Chest x-ray completed on 04/06/2014 show no acute cardiopulmonary process. Point of care hemoglobin 9.4    Assessment & Plan:  Please see problem specific assessment and plan.

## 2014-04-14 NOTE — Assessment & Plan Note (Signed)
Patient returns for followup of acute bronchitis. Symptoms remain despite use of steroid taper and when necessary albuterol. Oxygen saturations are in the high 90s on room air despite patient's subjective feeling of shortness of breath. She has had increasing lower extremity edema likely secondary to her prednisone use however lung exam was unremarkable. Recent chest x-ray showed no evidence of pneumonia or volume overload. -Discontinue prednisone due to weight gain -Continue albuterol when necessary, daily Flonase -Add Mucinex to regimen to help relieve congestion -Return in 5-7 days if symptoms are not improved

## 2014-04-14 NOTE — Assessment & Plan Note (Signed)
Patient has multiple small external hemorrhoids without evidence of thrombosis. Hemoglobin is stable. -Start Anusol cream

## 2014-04-22 ENCOUNTER — Inpatient Hospital Stay (HOSPITAL_COMMUNITY)
Admission: EM | Admit: 2014-04-22 | Discharge: 2014-04-30 | DRG: 280 | Disposition: A | Discharge status: Hospice | Payer: Medicare Other | Attending: Family Medicine | Admitting: Family Medicine

## 2014-04-22 ENCOUNTER — Emergency Department (HOSPITAL_COMMUNITY): Payer: Medicare Other

## 2014-04-22 ENCOUNTER — Encounter (HOSPITAL_COMMUNITY): Payer: Self-pay | Admitting: Emergency Medicine

## 2014-04-22 DIAGNOSIS — G9340 Encephalopathy, unspecified: Secondary | ICD-10-CM | POA: Diagnosis present

## 2014-04-22 DIAGNOSIS — I509 Heart failure, unspecified: Secondary | ICD-10-CM

## 2014-04-22 DIAGNOSIS — K639 Disease of intestine, unspecified: Secondary | ICD-10-CM | POA: Diagnosis present

## 2014-04-22 DIAGNOSIS — Z79899 Other long term (current) drug therapy: Secondary | ICD-10-CM

## 2014-04-22 DIAGNOSIS — M109 Gout, unspecified: Secondary | ICD-10-CM | POA: Diagnosis present

## 2014-04-22 DIAGNOSIS — Z885 Allergy status to narcotic agent status: Secondary | ICD-10-CM

## 2014-04-22 DIAGNOSIS — N039 Chronic nephritic syndrome with unspecified morphologic changes: Secondary | ICD-10-CM

## 2014-04-22 DIAGNOSIS — I4510 Unspecified right bundle-branch block: Secondary | ICD-10-CM | POA: Diagnosis present

## 2014-04-22 DIAGNOSIS — I2510 Atherosclerotic heart disease of native coronary artery without angina pectoris: Secondary | ICD-10-CM

## 2014-04-22 DIAGNOSIS — N186 End stage renal disease: Secondary | ICD-10-CM | POA: Diagnosis present

## 2014-04-22 DIAGNOSIS — I498 Other specified cardiac arrhythmias: Secondary | ICD-10-CM | POA: Diagnosis present

## 2014-04-22 DIAGNOSIS — J209 Acute bronchitis, unspecified: Secondary | ICD-10-CM

## 2014-04-22 DIAGNOSIS — R092 Respiratory arrest: Secondary | ICD-10-CM

## 2014-04-22 DIAGNOSIS — J811 Chronic pulmonary edema: Secondary | ICD-10-CM

## 2014-04-22 DIAGNOSIS — J45909 Unspecified asthma, uncomplicated: Secondary | ICD-10-CM | POA: Diagnosis present

## 2014-04-22 DIAGNOSIS — Z9861 Coronary angioplasty status: Secondary | ICD-10-CM

## 2014-04-22 DIAGNOSIS — M949 Disorder of cartilage, unspecified: Secondary | ICD-10-CM

## 2014-04-22 DIAGNOSIS — F3289 Other specified depressive episodes: Secondary | ICD-10-CM | POA: Diagnosis present

## 2014-04-22 DIAGNOSIS — Z905 Acquired absence of kidney: Secondary | ICD-10-CM

## 2014-04-22 DIAGNOSIS — E1139 Type 2 diabetes mellitus with other diabetic ophthalmic complication: Secondary | ICD-10-CM | POA: Diagnosis present

## 2014-04-22 DIAGNOSIS — D631 Anemia in chronic kidney disease: Secondary | ICD-10-CM

## 2014-04-22 DIAGNOSIS — J189 Pneumonia, unspecified organism: Secondary | ICD-10-CM

## 2014-04-22 DIAGNOSIS — Z7982 Long term (current) use of aspirin: Secondary | ICD-10-CM

## 2014-04-22 DIAGNOSIS — E559 Vitamin D deficiency, unspecified: Secondary | ICD-10-CM | POA: Diagnosis present

## 2014-04-22 DIAGNOSIS — R630 Anorexia: Secondary | ICD-10-CM

## 2014-04-22 DIAGNOSIS — E1169 Type 2 diabetes mellitus with other specified complication: Secondary | ICD-10-CM | POA: Diagnosis present

## 2014-04-22 DIAGNOSIS — E11359 Type 2 diabetes mellitus with proliferative diabetic retinopathy without macular edema: Secondary | ICD-10-CM | POA: Diagnosis present

## 2014-04-22 DIAGNOSIS — I10 Essential (primary) hypertension: Secondary | ICD-10-CM | POA: Diagnosis present

## 2014-04-22 DIAGNOSIS — K219 Gastro-esophageal reflux disease without esophagitis: Secondary | ICD-10-CM | POA: Diagnosis present

## 2014-04-22 DIAGNOSIS — E1142 Type 2 diabetes mellitus with diabetic polyneuropathy: Secondary | ICD-10-CM | POA: Diagnosis present

## 2014-04-22 DIAGNOSIS — Z515 Encounter for palliative care: Secondary | ICD-10-CM

## 2014-04-22 DIAGNOSIS — E118 Type 2 diabetes mellitus with unspecified complications: Secondary | ICD-10-CM

## 2014-04-22 DIAGNOSIS — IMO0002 Reserved for concepts with insufficient information to code with codable children: Secondary | ICD-10-CM

## 2014-04-22 DIAGNOSIS — I5033 Acute on chronic diastolic (congestive) heart failure: Secondary | ICD-10-CM | POA: Diagnosis present

## 2014-04-22 DIAGNOSIS — I214 Non-ST elevation (NSTEMI) myocardial infarction: Principal | ICD-10-CM

## 2014-04-22 DIAGNOSIS — R579 Shock, unspecified: Secondary | ICD-10-CM

## 2014-04-22 DIAGNOSIS — E785 Hyperlipidemia, unspecified: Secondary | ICD-10-CM | POA: Diagnosis present

## 2014-04-22 DIAGNOSIS — E1165 Type 2 diabetes mellitus with hyperglycemia: Secondary | ICD-10-CM | POA: Diagnosis present

## 2014-04-22 DIAGNOSIS — K5900 Constipation, unspecified: Secondary | ICD-10-CM | POA: Diagnosis present

## 2014-04-22 DIAGNOSIS — E874 Mixed disorder of acid-base balance: Secondary | ICD-10-CM | POA: Diagnosis present

## 2014-04-22 DIAGNOSIS — R110 Nausea: Secondary | ICD-10-CM | POA: Diagnosis not present

## 2014-04-22 DIAGNOSIS — M899 Disorder of bone, unspecified: Secondary | ICD-10-CM | POA: Diagnosis present

## 2014-04-22 DIAGNOSIS — N185 Chronic kidney disease, stage 5: Secondary | ICD-10-CM

## 2014-04-22 DIAGNOSIS — I120 Hypertensive chronic kidney disease with stage 5 chronic kidney disease or end stage renal disease: Secondary | ICD-10-CM | POA: Diagnosis present

## 2014-04-22 DIAGNOSIS — Z66 Do not resuscitate: Secondary | ICD-10-CM | POA: Diagnosis present

## 2014-04-22 DIAGNOSIS — Z85828 Personal history of other malignant neoplasm of skin: Secondary | ICD-10-CM

## 2014-04-22 DIAGNOSIS — E1149 Type 2 diabetes mellitus with other diabetic neurological complication: Secondary | ICD-10-CM | POA: Diagnosis present

## 2014-04-22 DIAGNOSIS — F329 Major depressive disorder, single episode, unspecified: Secondary | ICD-10-CM | POA: Diagnosis present

## 2014-04-22 DIAGNOSIS — H903 Sensorineural hearing loss, bilateral: Secondary | ICD-10-CM | POA: Diagnosis present

## 2014-04-22 DIAGNOSIS — G4700 Insomnia, unspecified: Secondary | ICD-10-CM | POA: Diagnosis present

## 2014-04-22 DIAGNOSIS — Z888 Allergy status to other drugs, medicaments and biological substances status: Secondary | ICD-10-CM

## 2014-04-22 DIAGNOSIS — F411 Generalized anxiety disorder: Secondary | ICD-10-CM | POA: Diagnosis present

## 2014-04-22 DIAGNOSIS — J9600 Acute respiratory failure, unspecified whether with hypoxia or hypercapnia: Secondary | ICD-10-CM

## 2014-04-22 DIAGNOSIS — D649 Anemia, unspecified: Secondary | ICD-10-CM

## 2014-04-22 LAB — COMPREHENSIVE METABOLIC PANEL
ALT: 41 U/L — AB (ref 0–35)
AST: 57 U/L — ABNORMAL HIGH (ref 0–37)
Albumin: 3 g/dL — ABNORMAL LOW (ref 3.5–5.2)
Alkaline Phosphatase: 212 U/L — ABNORMAL HIGH (ref 39–117)
BUN: 64 mg/dL — ABNORMAL HIGH (ref 6–23)
CO2: 12 mEq/L — ABNORMAL LOW (ref 19–32)
Calcium: 9.6 mg/dL (ref 8.4–10.5)
Chloride: 108 mEq/L (ref 96–112)
Creatinine, Ser: 4.85 mg/dL — ABNORMAL HIGH (ref 0.50–1.10)
GFR calc Af Amer: 9 mL/min — ABNORMAL LOW (ref >90)
GFR calc non Af Amer: 7 mL/min — ABNORMAL LOW (ref >90)
GLUCOSE: 176 mg/dL — AB (ref 70–99)
Potassium: 4.9 mEq/L (ref 3.7–5.3)
Sodium: 143 mEq/L (ref 137–147)
TOTAL PROTEIN: 6.2 g/dL (ref 6.0–8.3)
Total Bilirubin: 0.7 mg/dL (ref 0.3–1.2)

## 2014-04-22 LAB — URINALYSIS, ROUTINE W REFLEX MICROSCOPIC
Bilirubin Urine: NEGATIVE
GLUCOSE, UA: 100 mg/dL — AB
KETONES UR: NEGATIVE mg/dL
Leukocytes, UA: NEGATIVE
NITRITE: NEGATIVE
PH: 5 (ref 5.0–8.0)
Protein, ur: >300 mg/dL — AB
SPECIFIC GRAVITY, URINE: 1.018 (ref 1.005–1.030)
Urobilinogen, UA: 0.2 mg/dL (ref 0.0–1.0)

## 2014-04-22 LAB — CBC WITH DIFFERENTIAL/PLATELET
Basophils Absolute: 0 10*3/uL (ref 0.0–0.1)
Basophils Relative: 0 % (ref 0–1)
Eosinophils Absolute: 0 10*3/uL (ref 0.0–0.7)
Eosinophils Relative: 0 % (ref 0–5)
HEMATOCRIT: 24.1 % — AB (ref 36.0–46.0)
HEMOGLOBIN: 8.3 g/dL — AB (ref 12.0–15.0)
Lymphocytes Relative: 1 % — ABNORMAL LOW (ref 12–46)
Lymphs Abs: 0.2 10*3/uL — ABNORMAL LOW (ref 0.7–4.0)
MCH: 33.7 pg (ref 26.0–34.0)
MCHC: 34.4 g/dL (ref 30.0–36.0)
MCV: 98 fL (ref 78.0–100.0)
MONOS PCT: 4 % (ref 3–12)
Monocytes Absolute: 0.6 10*3/uL (ref 0.1–1.0)
NEUTROS ABS: 15.9 10*3/uL — AB (ref 1.7–7.7)
Neutrophils Relative %: 95 % — ABNORMAL HIGH (ref 43–77)
Platelets: 131 10*3/uL — ABNORMAL LOW (ref 150–400)
RBC: 2.46 MIL/uL — ABNORMAL LOW (ref 3.87–5.11)
RDW: 16.1 % — ABNORMAL HIGH (ref 11.5–15.5)
WBC: 16.8 10*3/uL — ABNORMAL HIGH (ref 4.0–10.5)

## 2014-04-22 LAB — I-STAT ARTERIAL BLOOD GAS, ED
Acid-base deficit: 14 mmol/L — ABNORMAL HIGH (ref 0.0–2.0)
BICARBONATE: 11.1 meq/L — AB (ref 20.0–24.0)
O2 SAT: 91 %
PO2 ART: 69 mmHg — AB (ref 80.0–100.0)
TCO2: 12 mmol/L (ref 0–100)
pCO2 arterial: 24.9 mmHg — ABNORMAL LOW (ref 35.0–45.0)
pH, Arterial: 7.257 — ABNORMAL LOW (ref 7.350–7.450)

## 2014-04-22 LAB — TROPONIN I
TROPONIN I: 1.74 ng/mL — AB (ref <0.30)
TROPONIN I: 4.58 ng/mL — AB (ref <0.30)
Troponin I: 2.38 ng/mL (ref <0.30)
Troponin I: 4.64 ng/mL (ref <0.30)

## 2014-04-22 LAB — PROTIME-INR
INR: 1.29 (ref 0.00–1.49)
PROTHROMBIN TIME: 15.8 s — AB (ref 11.6–15.2)

## 2014-04-22 LAB — TSH: TSH: 2.08 u[IU]/mL (ref 0.350–4.500)

## 2014-04-22 LAB — URINE MICROSCOPIC-ADD ON

## 2014-04-22 LAB — STREP PNEUMONIAE URINARY ANTIGEN: STREP PNEUMO URINARY ANTIGEN: NEGATIVE

## 2014-04-22 LAB — I-STAT CG4 LACTIC ACID, ED: Lactic Acid, Venous: 4.1 mmol/L — ABNORMAL HIGH (ref 0.5–2.2)

## 2014-04-22 LAB — PROCALCITONIN: Procalcitonin: 8.5 ng/mL

## 2014-04-22 LAB — PRO B NATRIURETIC PEPTIDE: Pro B Natriuretic peptide (BNP): 35772 pg/mL — ABNORMAL HIGH (ref 0–450)

## 2014-04-22 LAB — LACTIC ACID, PLASMA: LACTIC ACID, VENOUS: 5.4 mmol/L — AB (ref 0.5–2.2)

## 2014-04-22 MED ORDER — SODIUM CHLORIDE 0.9 % IV SOLN
250.0000 mL | INTRAVENOUS | Status: DC | PRN
  Administered 2014-04-30: 250 mL via INTRAVENOUS

## 2014-04-22 MED ORDER — SODIUM CHLORIDE 0.9 % IV SOLN: 1000.0000 mL | Freq: Once | INTRAVENOUS | Status: DC

## 2014-04-22 MED ORDER — ATORVASTATIN CALCIUM 80 MG PO TABS
80.0000 mg | ORAL_TABLET | Freq: Every day | ORAL | Status: DC
  Filled 2014-04-22 (×2): qty 1

## 2014-04-22 MED ORDER — MORPHINE SULFATE 2 MG/ML IJ SOLN
1.0000 mg | Freq: Once | INTRAMUSCULAR | Status: AC
  Administered 2014-04-22: 1 mg via INTRAVENOUS
  Filled 2014-04-22: qty 1

## 2014-04-22 MED ORDER — DEXTROSE 5 % IV SOLN
2.0000 g | INTRAVENOUS | Status: DC
  Administered 2014-04-22: 2 g via INTRAVENOUS
  Filled 2014-04-22: qty 2

## 2014-04-22 MED ORDER — VANCOMYCIN HCL IN DEXTROSE 1-5 GM/200ML-% IV SOLN
1000.0000 mg | Freq: Once | INTRAVENOUS | Status: DC
  Filled 2014-04-22: qty 200

## 2014-04-22 MED ORDER — IPRATROPIUM-ALBUTEROL 0.5-2.5 (3) MG/3ML IN SOLN: 3.0000 mL | Freq: Four times a day (QID) | RESPIRATORY_TRACT | Status: DC

## 2014-04-22 MED ORDER — ASPIRIN EC 81 MG PO TBEC
81.0000 mg | DELAYED_RELEASE_TABLET | Freq: Every day | ORAL | Status: DC
  Administered 2014-04-23: 81 mg via ORAL
  Filled 2014-04-22: qty 1

## 2014-04-22 MED ORDER — PREDNISONE 20 MG PO TABS
40.0000 mg | ORAL_TABLET | Freq: Two times a day (BID) | ORAL | Status: DC
  Administered 2014-04-23: 40 mg via ORAL
  Filled 2014-04-22 (×3): qty 2

## 2014-04-22 MED ORDER — VANCOMYCIN HCL IN DEXTROSE 1-5 GM/200ML-% IV SOLN: 1000.0000 mg | INTRAVENOUS | Status: DC

## 2014-04-22 MED ORDER — SODIUM CHLORIDE 0.9 % IJ SOLN: 3.0000 mL | INTRAMUSCULAR | Status: DC | PRN

## 2014-04-22 MED ORDER — DEXTROSE 5 % IV SOLN
1.0000 g | INTRAVENOUS | Status: DC
  Administered 2014-04-23 – 2014-04-26 (×4): 1 g via INTRAVENOUS
  Filled 2014-04-22 (×5): qty 10

## 2014-04-22 MED ORDER — HYDRALAZINE HCL 25 MG PO TABS
12.5000 mg | ORAL_TABLET | Freq: Three times a day (TID) | ORAL | Status: DC
  Filled 2014-04-22 (×5): qty 0.5

## 2014-04-22 MED ORDER — FUROSEMIDE 10 MG/ML IJ SOLN
80.0000 mg | Freq: Three times a day (TID) | INTRAMUSCULAR | Status: DC
  Administered 2014-04-22 – 2014-04-28 (×17): 80 mg via INTRAVENOUS
  Filled 2014-04-22 (×22): qty 8

## 2014-04-22 MED ORDER — SODIUM CHLORIDE 0.9 % IV SOLN
1000.0000 mL | Freq: Once | INTRAVENOUS | Status: AC
  Administered 2014-04-22: 1000 mL via INTRAVENOUS

## 2014-04-22 MED ORDER — ASPIRIN 81 MG PO TABS: 81.0000 mg | ORAL_TABLET | Freq: Every day | ORAL | Status: DC

## 2014-04-22 MED ORDER — LORAZEPAM 2 MG/ML IJ SOLN
0.5000 mg | INTRAMUSCULAR | Status: DC | PRN
  Administered 2014-04-22: 1 mg via INTRAVENOUS
  Filled 2014-04-22 (×2): qty 1

## 2014-04-22 MED ORDER — METOPROLOL SUCCINATE ER 25 MG PO TB24
25.0000 mg | ORAL_TABLET | Freq: Every day | ORAL | Status: DC
  Administered 2014-04-23: 25 mg via ORAL
  Filled 2014-04-22 (×3): qty 1

## 2014-04-22 MED ORDER — NITROGLYCERIN 0.4 MG SL SUBL: 0.4000 mg | SUBLINGUAL_TABLET | SUBLINGUAL | Status: DC | PRN

## 2014-04-22 MED ORDER — SODIUM CHLORIDE 0.9 % IV SOLN: 1000.0000 mL | INTRAVENOUS | Status: DC

## 2014-04-22 MED ORDER — AZITHROMYCIN 500 MG IV SOLR
500.0000 mg | INTRAVENOUS | Status: DC
  Administered 2014-04-22: 500 mg via INTRAVENOUS

## 2014-04-22 MED ORDER — NITROGLYCERIN 0.4 MG SL SUBL
0.4000 mg | SUBLINGUAL_TABLET | SUBLINGUAL | Status: DC | PRN
  Filled 2014-04-22: qty 1

## 2014-04-22 MED ORDER — METOPROLOL TARTRATE 25 MG PO TABS
12.5000 mg | ORAL_TABLET | Freq: Two times a day (BID) | ORAL | Status: DC
  Filled 2014-04-22: qty 1

## 2014-04-22 MED ORDER — ASPIRIN 300 MG RE SUPP: 300.0000 mg | RECTAL | Status: DC

## 2014-04-22 MED ORDER — HEPARIN SODIUM (PORCINE) 5000 UNIT/ML IJ SOLN: 60.0000 [IU]/kg | Freq: Once | INTRAMUSCULAR | Status: DC

## 2014-04-22 MED ORDER — SODIUM BICARBONATE 650 MG PO TABS
650.0000 mg | ORAL_TABLET | Freq: Two times a day (BID) | ORAL | Status: DC
  Administered 2014-04-23 – 2014-04-28 (×10): 650 mg via ORAL
  Filled 2014-04-22 (×17): qty 1

## 2014-04-22 MED ORDER — ASPIRIN 81 MG PO CHEW
324.0000 mg | CHEWABLE_TABLET | ORAL | Status: DC
  Filled 2014-04-22: qty 4

## 2014-04-22 MED ORDER — ALBUTEROL SULFATE HFA 108 (90 BASE) MCG/ACT IN AERS
2.0000 | INHALATION_SPRAY | Freq: Four times a day (QID) | RESPIRATORY_TRACT | Status: DC | PRN
Start: 2014-04-22 — End: 2014-04-23

## 2014-04-22 MED ORDER — ALBUTEROL SULFATE (2.5 MG/3ML) 0.083% IN NEBU
2.5000 mg | INHALATION_SOLUTION | RESPIRATORY_TRACT | Status: DC | PRN
  Administered 2014-04-26 – 2014-04-27 (×2): 2.5 mg via RESPIRATORY_TRACT
  Filled 2014-04-22 (×2): qty 3

## 2014-04-22 MED ORDER — ATORVASTATIN CALCIUM 40 MG PO TABS: 40.0000 mg | ORAL_TABLET | Freq: Every day | ORAL | Status: DC

## 2014-04-22 MED ORDER — DEXTROSE 5 % IV SOLN
500.0000 mg | INTRAVENOUS | Status: DC
  Administered 2014-04-23 – 2014-04-27 (×5): 500 mg via INTRAVENOUS
  Filled 2014-04-22 (×6): qty 500

## 2014-04-22 MED ORDER — SODIUM CHLORIDE 0.9 % IV SOLN
INTRAVENOUS | Status: DC
  Administered 2014-04-22 (×2): via INTRAVENOUS

## 2014-04-22 MED ORDER — HEPARIN BOLUS VIA INFUSION
3500.0000 [IU] | Freq: Once | INTRAVENOUS | Status: AC
  Administered 2014-04-22: 3500 [IU] via INTRAVENOUS
  Filled 2014-04-22: qty 3500

## 2014-04-22 MED ORDER — HEPARIN (PORCINE) IN NACL 100-0.45 UNIT/ML-% IJ SOLN
600.0000 [IU]/h | INTRAMUSCULAR | Status: DC
  Administered 2014-04-22: 700 [IU]/h via INTRAVENOUS
  Filled 2014-04-22: qty 250

## 2014-04-22 MED ORDER — DEXTROSE 5 % IV SOLN
1.0000 g | Freq: Once | INTRAVENOUS | Status: AC
  Administered 2014-04-22: 1 g via INTRAVENOUS
  Filled 2014-04-22 (×2): qty 1

## 2014-04-22 MED ORDER — SODIUM CHLORIDE 0.9 % IV SOLN: Freq: Once | INTRAVENOUS | Status: AC

## 2014-04-22 MED ORDER — FUROSEMIDE 10 MG/ML IJ SOLN
80.0000 mg | Freq: Once | INTRAMUSCULAR | Status: AC
  Administered 2014-04-22: 80 mg via INTRAVENOUS
  Filled 2014-04-22: qty 8

## 2014-04-22 MED ORDER — HEPARIN (PORCINE) IN NACL 100-0.45 UNIT/ML-% IJ SOLN: 14.0000 [IU]/kg/h | Freq: Once | INTRAMUSCULAR | Status: DC

## 2014-04-22 MED ORDER — ASPIRIN 325 MG PO TABS: 325.0000 mg | ORAL_TABLET | Freq: Every day | ORAL | Status: DC

## 2014-04-22 MED ORDER — IPRATROPIUM-ALBUTEROL 0.5-2.5 (3) MG/3ML IN SOLN
3.0000 mL | Freq: Once | RESPIRATORY_TRACT | Status: AC
  Administered 2014-04-22: 3 mL via RESPIRATORY_TRACT
  Filled 2014-04-22: qty 3

## 2014-04-22 MED ORDER — NITROGLYCERIN IN D5W 200-5 MCG/ML-% IV SOLN
2.0000 ug/min | Freq: Once | INTRAVENOUS | Status: AC
  Administered 2014-04-22: 5 ug/min via INTRAVENOUS
  Filled 2014-04-22: qty 250

## 2014-04-22 MED ORDER — SODIUM CHLORIDE 0.9 % IJ SOLN
3.0000 mL | Freq: Two times a day (BID) | INTRAMUSCULAR | Status: DC
  Administered 2014-04-23 – 2014-04-28 (×6): 3 mL via INTRAVENOUS

## 2014-04-22 NOTE — ED Notes (Signed)
Placed pt on 4L [place]- sats 88%. Pt states her chest pain started up again as soon as the nonrebreather was removed. Placed pt back on nonrebreather

## 2014-04-22 NOTE — Consult Note (Signed)
Primary Cardiologist: Dr. Irish Lack Chief Complaint: NSTEMI + CHF  HPI: The patient is an 80 yo female, followed by Dr. Irish Lack, with a history of CAD, CKD-stage 5, DM, HLD, HTN, GERD, asthma and anemia of chronic disease. Her last 2D echo was Jan 2015- EF 60-65% with grade II diastolic dysfunction, no WMA  and + for a sclerotic aortic valve. Her last cardiac cath was 05/27/2007 at which time she had PCI with Taxus DESs in the mid LAD, mid circ.  She was admitted in January of this year for chest pain. This was in the setting of setting of GIB and acute anemia with HGB of 7.1. Her pain resolved post transfusion. Enzymes were negative and no further cardiac w/u was indicated.   She presented back to the [facility] ER today with acute respiratory failure and dsypnea and found to have NSTEMI and possible CAP/ CHF exacerbation. She states she was in her usual state of health until 2 days ago, when she started to have intermittent SSCP. She also developed diarrhea, malaise and fatigue and subsequently stopped taking her medicines. Last PM, while sleeping, she developed severe SSCP described as pressure like. It radiated to her left arm. She had significant SOB. She denies any other symptoms. The pain did not improve throughout the night and did not resolve by this am. She called 911. When EMS arrived, she was given 4 baby ASA and SL NTG, but she states her pain did not improve much. On arrival to the ER her EKG demonstrated sinus tach with HR of 131 bpm. Enzymes were positive at 1.74 and 2.38. She was started on IV heparin and pain improved. Her CXR is concerning for PNA. BNP also significantly elevated at 35K. She has been admitted by [facility] and is now on antibiotics, IV heparin and IV NTG. Her SCr is 4.85. Hgb is 8.3.     Past Medical History  Diagnosis Date  . Stenosis, spinal, lumbar 05/20/2006    L 3-4, 4-5  MRI  . Bilateral sensorineural hearing loss 10/2006    audiogram  . Diabetes mellitus   . Chronic kidney  disease   . Diverticulosis of colon   . H/O endoscopy 11/2006    benign duodenal scar  . Normal cardiac stress test 01/30/2006    Cardiolite EF 54%  . Abnormal barium swallow 12/2009    ?thyroid nodule  . Complication of anesthesia     ' I cant take propaphol "  . Hypertension   . Shortness of breath   . Asthma   . GERD (gastroesophageal reflux disease)   . Arthritis   . PONV (postoperative nausea and vomiting)   . Diverticulosis     Past Surgical History  Procedure Laterality Date  . Combined hysterectomy abdominal w/ a&p repair / oophorectomy  1960's    for endometriosis  . Cholecystectomy open  2002  . Basal cell carcinoma excision  06/2003    gluteal fold  . Retinal laser procedure  10/2003    left eye  . Nephrectomy  12/2003    left due to non-function  . Minor breast biopsy  06/2010    right   . Esophageal dilation  10/2012    [place] Dr Amede[facility]enty  . Abdominal hysterectomy    . Esophagogastroduodenoscopy N/A 12/18/2013    Procedure: ESOPHAGOGASTRODUODENOSCOPY (EGD);  Surgeon: Missy Sabins, MD;  Location: [facility] ENDOSCOPY;  Service: Endoscopy;  Laterality: N/A;  . Colonoscopy N/A 12/19/2013    Procedure: COLONOSCOPY;  Surgeon: Jenny Reichmann  Charolette Forward, MD;  Location: [facility] ENDOSCOPY;  Service: Endoscopy;  Laterality: N/A;    Family History  Problem Relation Age of Onset  . Asthma Son   . Cancer Father     brain tumor  . Diabetes Father   . Cancer Mother     liver   Social History:  reports that she has never smoked. She has never used smokeless tobacco. She reports that she does not drink alcohol or use illicit drugs.  Allergies:  Allergies  Allergen Reactions  . Fexofenadine Other (See Comments)    confusion  . Trazodone And Nefazodone     Lightheadedness  . Ciprofloxacin Other (See Comments)    REACTION: Headache and stomachache  . Diphenhydramine Hcl Other (See Comments)    REACTION: hyperactive  . Gabapentin Other (See Comments)    REACTION: leg pain  . Procaine Hcl      REACTION: Rash  . Propofol Diarrhea and Nausea And Vomiting    Had endoscopy at Dr Amedeo Plenty 10/2012  . Sulfamethoxazole-Trimethoprim     REACTION: Nausea  . Codeine Rash    REACTION: Rash  . Doxycycline Hyclate Rash    REACTION: rash?    Prior to Admission medications   Medication Sig Start Date End Date Taking? Authorizing Provider  acetaminophen (TYLENOL) 650 MG CR tablet Take 650 mg by mouth every 8 (eight) hours as needed for pain.   Yes Historical Provider, MD  albuterol (PROVENTIL HFA;VENTOLIN HFA) 108 (90 BASE) MCG/ACT inhaler Inhale 2 puffs into the lungs every 6 (six) hours as needed for wheezing or shortness of breath. 04/13/14  Yes Lupita Dawn, MD  allopurinol (ZYLOPRIM) 100 MG tablet take 1 tablet by mouth once daily 02/17/14  Yes Lupita Dawn, MD  aspirin 81 MG tablet Take 81 mg by mouth daily.   Yes Historical Provider, MD  CARBOXYMETHYLCELLULOSE SODIUM OP Place 1 drop into both eyes every 4 (four) hours as needed (dry eyes).   Yes Historical Provider, MD  fluticasone (FLONASE) 50 MCG/ACT nasal spray Place 2 sprays into both nostrils every morning.   Yes Historical Provider, MD  furosemide (LASIX) 20 MG tablet Take 1 tablet (20 mg total) by mouth daily. 04/13/14 04/13/15 Yes Lupita Dawn, MD  glucose blood test strip Test fasting and when symptoms of low blood sugar 03/21/11  Yes Candelaria Celeste, MD  hydrocortisone (PROCTOSOL HC) 2.5 % rectal cream Place 1 application rectally 2 (two) times daily. 04/13/14  Yes Lupita Dawn, MD  isosorbide mononitrate (IMDUR) 30 MG 24 hr tablet Take 1 tablet (30 mg total) by mouth daily. 01/09/14  Yes Lupita Dawn, MD  loperamide (IMODIUM) 2 MG capsule Take 2 mg by mouth 4 (four) times daily as needed.    Yes Historical Provider, MD  metoprolol succinate (TOPROL-XL) 50 MG 24 hr tablet Take 1 tablet (50 mg total) by mouth daily. Take with or immediately following a meal. 03/11/14  Yes Lupita Dawn, MD  NIFEdipine (PROCARDIA-XL/ADALAT CC) 30 MG 24 hr  tablet Take 3 tablets (90 mg total) by mouth daily. 01/23/14  Yes Lupita Dawn, MD  omeprazole (PRILOSEC) 20 MG capsule Take 1 capsule (20 mg total) by mouth daily. 01/09/14  Yes Lupita Dawn, MD  polyethylene glycol (MIRALAX / GLYCOLAX) packet Take 17 g by mouth daily as needed for moderate constipation.   Yes Historical Provider, MD  predniSONE (DELTASONE) 20 MG tablet Take 2 tablets (40 mg total) by mouth 2 (two) times daily with  a meal. 04/06/14  Yes Dayarmys Piloto de Gwendalyn Ege, MD  Pyridoxine HCl (VITAMIN B-6) 100 MG tablet Take 100 mg by mouth daily.    Yes Historical Provider, MD  simethicone (MYLICON) 80 MG chewable tablet Chew 80 mg by mouth every 6 (six) hours as needed for flatulence.   Yes Historical Provider, MD  simvastatin (ZOCOR) 20 MG tablet Take 20 mg by mouth daily.   Yes Historical Provider, MD  sodium bicarbonate 650 MG tablet Take 650 mg by mouth 2 (two) times daily. 2 tablets BID   Yes Historical Provider, MD  traMADol (ULTRAM) 50 MG tablet Take 1 tablet (50 mg total) by mouth every 12 (twelve) hours as needed. 04/01/14  Yes Lupita Dawn, MD  Vitamin D, Ergocalciferol, (DRISDOL) 50000 UNITS CAPS capsule take 1 capsule by mouth every week 03/11/14  Yes Lupita Dawn, MD  busPIRone (BUSPAR) 15 MG tablet Take 7.5 mg by mouth 2 (two) times daily. 07/22/13   Candelaria Celeste, MD  nitroGLYCERIN (NITROSTAT) 0.4 MG SL tablet Place 0.4 mg under the tongue every 5 (five) minutes as needed for chest pain. 12/22/13   Lupita Dawn, MD  Phenol-Glycerin (CHLORASEPTIC MAX SORE THROAT) 1.5-33 % LIQD Use as directed 1-2 sprays in the mouth or throat every 6 (six) hours as needed (dry/sore throat).    Historical Provider, MD     Results for orders placed during the hospital encounter of 04/22/14 (from the past 48 hour(s))  PRO B NATRIURETIC PEPTIDE     Status: Abnormal   Collection Time    04/22/14  1:10 PM      Result Value Ref Range   Pro B Natriuretic peptide (BNP) 35772.0 (*) 0 - 450 pg/mL  TROPONIN  I     Status: Abnormal   Collection Time    04/22/14  1:10 PM      Result Value Ref Range   Troponin I 1.74 (*) <0.30 ng/mL   Comment:            Due to the release kinetics of cTnI,     a negative result within the first hours     of the onset of symptoms does not rule out     myocardial infarction with certainty.     If myocardial infarction is still suspected,     repeat the test at appropriate intervals.     CRITICAL RESULT CALLED TO, READ BACK BY AND VERIFIED WITH:     H.DOOLEY,RN 1410 04/22/14 CLARK,S  CBC WITH DIFFERENTIAL     Status: Abnormal   Collection Time    04/22/14  1:10 PM      Result Value Ref Range   WBC 16.8 (*) 4.0 - 10.5 K/uL   RBC 2.46 (*) 3.87 - 5.11 MIL/uL   Hemoglobin 8.3 (*) 12.0 - 15.0 g/dL   HCT 24.1 (*) 36.0 - 46.0 %   MCV 98.0  78.0 - 100.0 fL   MCH 33.7  26.0 - 34.0 pg   MCHC 34.4  30.0 - 36.0 g/dL   RDW 16.1 (*) 11.5 - 15.5 %   Platelets 131 (*) 150 - 400 K/uL   Neutrophils Relative % 95 (*) 43 - 77 %   Neutro Abs 15.9 (*) 1.7 - 7.7 K/uL   Lymphocytes Relative 1 (*) 12 - 46 %   Lymphs Abs 0.2 (*) 0.7 - 4.0 K/uL   Monocytes Relative 4  3 - 12 %   Monocytes Absolute 0.6  0.1 - 1.0  K/uL   Eosinophils Relative 0  0 - 5 %   Eosinophils Absolute 0.0  0.0 - 0.7 K/uL   Basophils Relative 0  0 - 1 %   Basophils Absolute 0.0  0.0 - 0.1 K/uL  COMPREHENSIVE METABOLIC PANEL     Status: Abnormal   Collection Time    04/22/14  1:10 PM      Result Value Ref Range   Sodium 143  137 - 147 mEq/L   Potassium 4.9  3.7 - 5.3 mEq/L   Chloride 108  96 - 112 mEq/L   CO2 12 (*) 19 - 32 mEq/L   Glucose, Bld 176 (*) 70 - 99 mg/dL   BUN 64 (*) 6 - 23 mg/dL   Creatinine, Ser 4.85 (*) 0.50 - 1.10 mg/dL   Calcium 9.6  8.4 - 10.5 mg/dL   Total Protein 6.2  6.0 - 8.3 g/dL   Albumin 3.0 (*) 3.5 - 5.2 g/dL   AST 57 (*) 0 - 37 U/L   ALT 41 (*) 0 - 35 U/L   Alkaline Phosphatase 212 (*) 39 - 117 U/L   Total Bilirubin 0.7  0.3 - 1.2 mg/dL   GFR calc non Af Amer 7 (*) >90  mL/min   GFR calc Af Amer 9 (*) >90 mL/min   Comment: (NOTE)     The eGFR has been calculated using the CKD EPI equation.     This calculation has not been validated in all clinical situations.     eGFR's persistently <90 mL/min signify possible Chronic Kidney     Disease.  I-STAT CG4 LACTIC ACID, ED     Status: Abnormal   Collection Time    04/22/14  1:28 PM      Result Value Ref Range   Lactic Acid, Venous 4.10 (*) 0.5 - 2.2 mmol/L  I-STAT ARTERIAL BLOOD GAS, ED     Status: Abnormal   Collection Time    04/22/14  1:45 PM      Result Value Ref Range   pH, Arterial 7.257 (*) 7.350 - 7.450   pCO2 arterial 24.9 (*) 35.0 - 45.0 mmHg   pO2, Arterial 69.0 (*) 80.0 - 100.0 mmHg   Bicarbonate 11.1 (*) 20.0 - 24.0 mEq/L   TCO2 12  0 - 100 mmol/L   O2 Saturation 91.0     Acid-base deficit 14.0 (*) 0.0 - 2.0 mmol/L   Collection site BRACHIAL ARTERY     Drawn by Operator     Sample type ARTERIAL    TROPONIN I     Status: Abnormal   Collection Time    04/22/14  2:40 PM      Result Value Ref Range   Troponin I 2.38 (*) <0.30 ng/mL   Comment:            Due to the release kinetics of cTnI,     a negative result within the first hours     of the onset of symptoms does not rule out     myocardial infarction with certainty.     If myocardial infarction is still suspected,     repeat the test at appropriate intervals.     CRITICAL VALUE NOTED.  VALUE IS CONSISTENT WITH PREVIOUSLY REPORTED AND CALLED VALUE.  URINALYSIS, ROUTINE W REFLEX MICROSCOPIC     Status: Abnormal   Collection Time    04/22/14  4:28 PM      Result Value Ref Range   Color, Urine YELLOW  YELLOW  APPearance CLOUDY (*) CLEAR   Specific Gravity, Urine 1.018  1.005 - 1.030   pH 5.0  5.0 - 8.0   Glucose, UA 100 (*) NEGATIVE mg/dL   Hgb urine dipstick SMALL (*) NEGATIVE   Bilirubin Urine NEGATIVE  NEGATIVE   Ketones, ur NEGATIVE  NEGATIVE mg/dL   Protein, ur >300 (*) NEGATIVE mg/dL   Urobilinogen, UA 0.2  0.0 - 1.0  mg/dL   Nitrite NEGATIVE  NEGATIVE   Leukocytes, UA NEGATIVE  NEGATIVE  URINE MICROSCOPIC-ADD ON     Status: Abnormal   Collection Time    04/22/14  4:28 PM      Result Value Ref Range   Squamous Epithelial / LPF RARE  RARE   WBC, UA 3-6  <3 WBC/hpf   Bacteria, UA RARE  RARE   Casts HYALINE CASTS (*) NEGATIVE   Comment: GRANULAR CAST   Urine-Other AMORPHOUS URATES/PHOSPHATES     Dg Chest Port 1 View  04/22/2014   CLINICAL DATA:  Shortness of breath and weakness.  Chest pain.  EXAM: PORTABLE CHEST - 1 VIEW  COMPARISON:  04/06/2014  FINDINGS: Cardiac silhouette is mildly enlarged. The lungs are hypoinflated. There are new bilateral perihilar opacities, right greater than left. No definite pleural effusion or pneumothorax is identified. No acute osseous abnormality is seen.  IMPRESSION: Bilateral perihilar opacities, which may reflect edema or possibly multifocal pneumonia.   Electronically Signed   By: Logan Bores   On: 04/22/2014 14:38    Review of Systems  Constitutional: Positive for malaise/fatigue.  Respiratory: Positive for shortness of breath.   Cardiovascular: Positive for chest pain. Negative for leg swelling.  Neurological: Negative for dizziness and loss of consciousness.  All other systems reviewed and are negative.   Blood pressure 134/67, pulse 110, resp. rate 15, height 4' 11"  (1.499 m), weight 136 lb (61.689 kg), SpO2 98.00%. Physical Exam  Constitutional: She is oriented to person, place, and time. She appears well-developed and well-nourished.  Cardiovascular: Normal rate, regular rhythm and intact distal pulses.   Murmur (1/6 SM) heard. Respiratory: Effort normal. No respiratory distress. She has wheezes. She has rales.  GI: Soft. Bowel sounds are normal. She exhibits no distension. There is tenderness (mild diffuse).  Musculoskeletal: She exhibits no edema.  Neurological: She is alert and oriented to person, place, and time.  Skin: Skin is warm and dry.    Psychiatric: She has a normal mood and affect. Her behavior is normal.     Assessment/Plan Principal Problem:   Acute respiratory failure Active Problems:   DIABETES MELLITUS, II, COMPLICATIONS   Anemia in chronic kidney disease(285.21)   HYPERTENSION   ATHEROSCLEROTIC CARDIOVASCULAR DISEASE   CKD (chronic kidney disease) stage 5, GFR less than 15 ml/min   NSTEMI (non-ST elevated myocardial infarction)   Acute CHF   CAP (community acquired pneumonia)   Shock   Pulmonary edema    Brittainy Simmons 04/22/2014, 7:18 PM   Patient seen and examined. Agree with assessment and plan. Pt is an 80 yo WF with h/o CAD, CKD-stage 5, DM, HLD, HTN, GERD, asthma and anemia of chronic disease. She presented to ER today with acute respiratory failure and currently is on BiPAP with improvement Trop positive for NSTEMI. CXR c/w pneumonia/CHF. According to son Hb last week was 9.6, today 8.3 with Cr increased to 4.85. No chest pain. ECG with sinus tachycardia at 126 with RBBB. She is receiving Lasix 80 mg every 8 hrs and IV NTG. May benefit from hydralazine/nitrates  with CHF and advanced renal disease. Not a cath candidate presently with renal disease. Assess for GI blood loss, with recent hct drop in 1 week.   Troy Sine, MD, [facility] 04/22/2014 8:19 PM

## 2014-04-22 NOTE — ED Notes (Signed)
CRITICAL VALUE ALERT  Critical value received: troponin 1.74  Date of notification:  04/22/2014  Time of notification:  5284  Critical value read back:yes  Nurse who received alert:  Leodis Rains  MD notified (1st page):  Dr. Rogene Houston- no verbal orders at this time  Time of first page:  1412

## 2014-04-22 NOTE — H&P (Signed)
FMTS Attending Admit Note  Patient seen and examined by me in ED, discussed with admitting resident and I agree with Dr Awanda Mink' assessment and plan as documented.  Patient is an 21yoF with h/o CAD, DM, HTN, CKD stage V and known diastolic CHF who presents with worsening dyspnea and chest pain.  She has markedly elevated troponin I and Creatinine, as well as elevated lactate and a pro-BNP of 35,000. She is on BiPAP and is mentating well during interview/exam, able to answer questions.   Alert, able to speak despite BiPAP mask. Significant pallor.  Distant heart sounds Coarse breath sounds bilaterally throughout lung fields.  A/P: Patient with NSTEMI and subsequent acute worsening of diastolic HF.  Appreciate CCM and Cardiology consult; given patient and son's wishes to be DNR/DNI, will admit to SDU,on BiPAP.  Her acutely worsening renal failure preclude cardiac catheterization.  Dalbert Mayotte, MD

## 2014-04-22 NOTE — H&P (Signed)
[facility] Admission History and Physical Service Pager: [phone]  Patient name: Belinda [name] Medical record number: [id number] Date of birth: 01/31/1927 Age: 80 y.o. Gender: female  Primary Care Provider: Lupita Dawn, MD Consultants: cards, renal, PCCM Code Status: partial code   Chief Complaint: chest pain; SOB  Assessment and Plan: Belinda [name] is a 80 y.o. female presenting with worsening SOB and acute onset chest pain. PMH is significant for CAD x 4 vessel stent, HTN, HLD, CKD V (does not want Dialysis), R AV fistula, Single Kidney, Diastolic CHF (grade II) EF 55-60% in 12/2013, Anemia of Renal Disease  #CV- Hx of CAD s/p 4 vessel stent/CHF. Presentation likely multifactorial ACS (troponins cont to trend up 1.74 -> 2.38, EKG with RBBB, pt with prior STEMI and stent, HEART score of 8) vs demand ischemia (tachycardiac and volume overloaded, proBNP 35,000 lactic acid 4) vs CHF exacerbation (pt reporting PND, orthopnea, exertional dyspnea, lungs diffusely rhonchorous, CXR with perihilar opacities). Last echo 0/2725 grade ii diastolic with EF 36-64% -admit to SDU under Dr. Lindell Noe -cards consulted by EDP, may take pt to cath vs medical management with her co morbidities ( Stage V CKD w/ contrast load)  -pt to continue on heparin drip -placed on nitro drip for sx relief, cont on morphine.  Hold home Imdur in setting of nitro drip  -home lasix held, given renal fxn, will defer to cards for now for diuresis -cont toprol xl 25mg  (home medication)  -ator 80, will give asa suppository now 300mg  then ASA 81mg   -morning EKG and prn for chest pain -cycle troponins  -risk stratification with TSH, lipids -trend i/os -daily weights -repeat ECHO if pt not taken to cath   #Pulm- ARF on BiPAP. Respiratory alkalosis with uncompensated metabolic AG acidosis (2/2 lactic acidosis) Lactic acid 4. ABG with PH of 7.257, PCO2 24.9 and bicarb of 11.1 Ddx include fluid overload 2/2  CHF vs CAP; Treated for HCAP in the ED given prior hospitalization, however > 90 days ago. Will cont to treat for CAP only ( No other risk factors for HCAP).  -PCCM consulted, will come to evaluate -cont BiPAP for now with low threshold to intubate -s/p vanc/cefipime in the ED for CAP; will start rocephin azithro tmw am  -gram stain, sputum cx -f/up Bcx (obtained in ED prior to abx) -legionella, strep pneumo -vs per floor protocol   #Renal- CKD stage 5 Baseline Cr 4-5; s/p nephrectomy several years ago, had a previous avf placed, not functioning currently, pt does not desire dialysis as an outpt; Vit D 10, likely 2/2 metabolic bone disease; s/p fluid bolus in the ED -sees Dr. Joelyn Oms in outpt -may need dialysis if pt is cath'd (pt with previous wish to not be on dialysis, R AVF that is non functional)  -renal consulted, plan to see pt tmw  #FEN/GI: Anemia (likely 2/2 renal disease); Hx of GI bleed during hospitalization in 12/2013; no current history of acute blood loss; hgb 8.3 -NPO while on BiPAP -will trend hgb with CBCs  #Endo: DMII; last A1C 5.5 -currently diet controlled -will check prn   Prophylaxis: heparin drip   Disposition: admit to SDU with low threshold to transfer to ICU  History of Present Illness: Belinda [name] is a 80 y.o. female presenting with acute respiratory failure and dyspnea found to have NSTEMI/possible CAP/CHF exacerbation.  Pt was seen in clinic about 3 weeks ago for some coughing and shortness of breath, dx with bronchitis,  and given azithromycin and if no improvement in five days, to follow up.  Pt did not improve during that time and she came back, at which she was put on prednisone, which did improve her somewhat.  Pt was seen about one week ago now and still had some rhoncourous breath sounds, and recommended she take some mucinex.  However, over the past two days, pt has not been able to take her medications, has not felt too well, and was seen yesterday by  her renal doctors (sees the nephrologists for ESRD w/ unilateral kidney).  She was Rx doxycycline, but was unable to take this due to her shortness of breath.  Over the past 24 hours, she has been describing worsening chest pain and shortness of breath along with diarrhea over the past 48 hours.  Her chest pressure worsens with activity and does relieve with rest.  Has been describing worsening LE edema along with PND and has been inclining the bed up to 30 degrees or more to help her breath at night for the past 10 months or so.  Denies any nausea or vomiting, dizziness, changes in vision, HA, fevers.    In the ED, pt had multiple evaluations performed including CMET showing Creatinine of 4.85 (baseline 4-5), Troponin of 1.74, [place] 35[address].0, Lactic Acid of 4.10, leukocytosis to 16.8 w/ Left shift, Hgb of 8.3 (baseline around 9), ABG showing pH of 7.25, pCO2 of 24.9, PO2 69.0, Bicarb of 11.1.  She was started on BiPap in the ED, cardiology was consulted.    Pt denies smoking history, alcohol use, drug use, recent travel, recent sick contacts.   Review Of Systems: Per HPI   Patient Active Problem List   Diagnosis Date Noted  . Hemorrhoids 04/13/2014  . Acute bronchitis 03/31/2014  . Hearing loss 03/31/2014  . Diarrhea 03/11/2014  . Decreased appetite 03/11/2014  . Nasal congestion 02/09/2014  . Unspecified vitamin D deficiency 01/26/2014  . Viral illness 01/09/2014  . Elevated liver enzymes 01/06/2014  . GI bleeding 12/29/2013  . Lower GI bleed 12/29/2013  . Anemia 12/22/2013  . Unstable angina 12/17/2013  . CAD (coronary artery disease) 12/17/2013  . Acute blood loss anemia 12/17/2013  . GIB (gastrointestinal bleeding) 12/17/2013  . Insomnia 11/22/2013  . Constipation 10/24/2013  . Cough 09/05/2013  . Dark stools 07/31/2013  . Enteritis 07/31/2013  . Fall 07/01/2013  . Musculoskeletal pain 07/01/2013  . Seborrheic dermatitis, unspecified 04/08/2013  . AC (acromioclavicular) joint  arthritis 03/25/2013  . Fatigue 09/25/2012  . Osteopenia 08/26/2012  . Syncope 08/01/2012  . Vocal cord atrophy 09/26/2011  . Depression 08/10/2011  . Diabetic enteropathy 04/07/2011  . ASTHMA, INTERMITTENT 09/25/2010  . MACULAR DEGENERATION, EXUDATIVE 08/05/2010  . AEROPHAGIA 03/10/2010  . VENOUS INSUFFICIENCY 03/04/2010  . VISUAL ACUITY, DECREASED, RIGHT EYE 12/09/2009  . IBS 02/11/2009  . CKD (chronic kidney disease) stage 5, GFR less than 15 ml/min 10/22/2008  . URINARY INCONTINENCE, URGE 06/15/2008  . Gout 04/28/2008  . ATHEROSCLEROTIC CARDIOVASCULAR DISEASE 01/07/2008  . Anemia in chronic kidney disease(285.21) 10/24/2007  . UNSTEADY GAIT 06/18/2007  . RETINOPATHY, DIABETIC, PROLIFERATIVE 02/23/2007  . PSEUDOGOUT 02/07/2007  . HYPERTENSION 02/07/2007  . HERNIA, SITE NOS W/O OBSTRUCTION/GANGRENE 02/07/2007  . NEPHROLITHIASIS, HX OF 02/07/2007  . NEUROPATHY, DIABETIC 01/31/2007  . DIABETES MELLITUS, II, COMPLICATIONS 29/52/8413  . HYPERLIPIDEMIA 01/31/2007  . ANXIETY 01/31/2007  . CATARACT 01/31/2007  . HEARING LOSS NOS OR DEAFNESS 01/31/2007  . RHINITIS, ALLERGIC 01/31/2007  . GASTROESOPHAGEAL REFLUX, NO  ESOPHAGITIS 01/31/2007  . LUMBAR SPINAL STENOSIS 01/31/2007   Past Medical History: Past Medical History  Diagnosis Date  . Stenosis, spinal, lumbar 05/20/2006    L 3-4, 4-5  MRI  . Bilateral sensorineural hearing loss 10/2006    audiogram  . Diabetes mellitus   . Chronic kidney disease   . Diverticulosis of colon   . H/O endoscopy 11/2006    benign duodenal scar  . Normal cardiac stress test 01/30/2006    Cardiolite EF 54%  . Abnormal barium swallow 12/2009    ?thyroid nodule  . Complication of anesthesia     ' I cant take propaphol "  . Hypertension   . Shortness of breath   . Asthma   . GERD (gastroesophageal reflux disease)   . Arthritis   . PONV (postoperative nausea and vomiting)   . Diverticulosis    Past Surgical History: Past Surgical History   Procedure Laterality Date  . Combined hysterectomy abdominal w/ a&p repair / oophorectomy  1960's    for endometriosis  . Cholecystectomy open  2002  . Basal cell carcinoma excision  06/2003    gluteal fold  . Retinal laser procedure  10/2003    left eye  . Nephrectomy  12/2003    left due to non-function  . Minor breast biopsy  06/2010    right   . Esophageal dilation  10/2012    [place] [facility]  . Abdominal hysterectomy    . Esophagogastroduodenoscopy N/A 12/18/2013    Procedure: ESOPHAGOGASTRODUODENOSCOPY (EGD);  Surgeon: Missy Sabins, MD;  Location: [facility] ENDOSCOPY;  Service: Endoscopy;  Laterality: N/A;  . Colonoscopy N/A 12/19/2013    Procedure: COLONOSCOPY;  Surgeon: Missy Sabins, MD;  Location: [place];  Service: Endoscopy;  Laterality: N/A;   Social History: History  Substance Use Topics  . Smoking status: Never Smoker   . Smokeless tobacco: Never Used  . Alcohol Use: No   Additional social history: lives with family Please also refer to relevant sections of EMR.  Family History: Family History  Problem Relation Age of Onset  . Asthma Son   . Cancer Father     brain tumor  . Diabetes Father   . Cancer Mother     liver   Allergies and Medications: Allergies  Allergen Reactions  . Fexofenadine Other (See Comments)    confusion  . Trazodone And Nefazodone     Lightheadedness  . Ciprofloxacin Other (See Comments)    REACTION: Headache and stomachache  . Diphenhydramine Hcl Other (See Comments)    REACTION: hyperactive  . Gabapentin Other (See Comments)    REACTION: leg pain  . Procaine Hcl     REACTION: Rash  . Propofol Diarrhea and Nausea And Vomiting    Had endoscopy at Dr Amedeo Plenty 10/2012  . Sulfamethoxazole-Trimethoprim     REACTION: Nausea  . Codeine Rash    REACTION: Rash  . Doxycycline Hyclate Rash    REACTION: rash?   No current facility-administered medications on file prior to encounter.   Current Outpatient Prescriptions on File  Prior to Encounter  Medication Sig Dispense Refill  . acetaminophen (TYLENOL) 650 MG CR tablet Take 650 mg by mouth every 8 (eight) hours as needed for pain.      Marland Kitchen albuterol (PROVENTIL HFA;VENTOLIN HFA) 108 (90 BASE) MCG/ACT inhaler Inhale 2 puffs into the lungs every 6 (six) hours as needed for wheezing or shortness of breath.  18 g  1  . allopurinol (ZYLOPRIM) 100  MG tablet take 1 tablet by mouth once daily  90 tablet  1  . aspirin 81 MG tablet Take 81 mg by mouth daily.      Marland Kitchen CARBOXYMETHYLCELLULOSE SODIUM OP Place 1 drop into both eyes every 4 (four) hours as needed (dry eyes).      . fluticasone (FLONASE) 50 MCG/ACT nasal spray Place 2 sprays into both nostrils every morning.      . furosemide (LASIX) 20 MG tablet Take 1 tablet (20 mg total) by mouth daily.  30 tablet  1  . glucose blood test strip Test fasting and when symptoms of low blood sugar      . hydrocortisone (PROCTOSOL HC) 2.5 % rectal cream Place 1 application rectally 2 (two) times daily.  30 g  0  . isosorbide mononitrate (IMDUR) 30 MG 24 hr tablet Take 1 tablet (30 mg total) by mouth daily.  30 tablet  5  . loperamide (IMODIUM) 2 MG capsule Take 2 mg by mouth 4 (four) times daily as needed.       . metoprolol succinate (TOPROL-XL) 50 MG 24 hr tablet Take 1 tablet (50 mg total) by mouth daily. Take with or immediately following a meal.  90 tablet  1  . NIFEdipine (PROCARDIA-XL/ADALAT CC) 30 MG 24 hr tablet Take 3 tablets (90 mg total) by mouth daily.  90 tablet  3  . omeprazole (PRILOSEC) 20 MG capsule Take 1 capsule (20 mg total) by mouth daily.  30 capsule  3  . predniSONE (DELTASONE) 20 MG tablet Take 2 tablets (40 mg total) by mouth 2 (two) times daily with a meal.  20 tablet  0  . Pyridoxine HCl (VITAMIN B-6) 100 MG tablet Take 100 mg by mouth daily.       . simethicone (MYLICON) 80 MG chewable tablet Chew 80 mg by mouth every 6 (six) hours as needed for flatulence.      . simvastatin (ZOCOR) 20 MG tablet Take 20 mg by  mouth daily.      . sodium bicarbonate 650 MG tablet Take 650 mg by mouth 2 (two) times daily. 2 tablets BID      . traMADol (ULTRAM) 50 MG tablet Take 1 tablet (50 mg total) by mouth every 12 (twelve) hours as needed.  30 tablet  0  . Vitamin D, Ergocalciferol, (DRISDOL) 50000 UNITS CAPS capsule take 1 capsule by mouth every week  8 capsule  0  . busPIRone (BUSPAR) 15 MG tablet Take 7.5 mg by mouth 2 (two) times daily.      . nitroGLYCERIN (NITROSTAT) 0.4 MG SL tablet Place 0.4 mg under the tongue every 5 (five) minutes as needed for chest pain.      . Phenol-Glycerin (CHLORASEPTIC MAX SORE THROAT) 1.5-33 % LIQD Use as directed 1-2 sprays in the mouth or throat every 6 (six) hours as needed (dry/sore throat).        Objective: BP 152/78  Pulse 117  Resp 30  Ht 4\' 11"  (1.499 m)  Wt 136 lb (61.689 kg)  BMI 27.45 kg/m2  SpO2 96% Exam: General: droswy, follows commands, uncomfortable HEENT: NCAT, Bipap in place, PERRL (pinpoint pupils), EOMI, dryMM Neck: + 8-9 cm JVD Cardiovascular: difficult to appreciate heart with overlying breath sounds, tachycardic Respiratory: tachypneic, coarse breath sounds anteriorally appreciated  Abdomen: soft, NTND, hypoactive BS Extremities: trace or 1+ nonpitting edema bilaterally equal and symmetric , + 2/4 pulses B/L LE Skin: doughy sensitive to touch, mult areas of skin breakdown  Neuro: opens eyes, follows commands, no focal deficits   Labs and Imaging: CBC BMET   Recent Labs Lab 04/22/14 1310  WBC 16.8*  HGB 8.3*  HCT 24.1*  PLT 131*    Recent Labs Lab 04/22/14 1310  NA 143  K 4.9  CL 108  CO2 12*  BUN 64*  CREATININE 4.85*  GLUCOSE 176*  CALCIUM 9.6     Lactic acid 4.1 Blood culture- pending    04/22/2014 13:45  Sample type ARTERIAL  pH, Arterial 7.257 (L)  pCO2 arterial 24.9 (L)  pO2, Arterial 69.0 (L)  Bicarbonate 11.1 (L)  TCO2 12  Acid-base deficit 14.0 (H)  O2 Saturation 91.0  Collection site BRACHIAL ARTERY     CXR- 04/22/14 IMPRESSION:  Bilateral perihilar opacities, which may reflect edema or possibly  multifocal pneumonia.  EKG- RBBB, sinus tach  Langston Masker, MD 04/22/2014, 3:15 PM PGY-1, [place] Intern pager: [phone], text pages welcome  I have seen and evaluated the pt with Dr. Skeet Simmer, please see her H&P for excellent information.  My additions are in red.  Tamela Oddi Awanda Mink, DO of [facility] 04/22/2014, 5:04 PM

## 2014-04-22 NOTE — ED Notes (Signed)
Pt's son at bedside and informed about the plan of care. MD at bedside as well

## 2014-04-22 NOTE — ED Notes (Signed)
Notified MD of elevated troponin. No further orders at this time. Pt resting on nonrebreather with family at bedside.

## 2014-04-22 NOTE — ED Notes (Addendum)
Pt's BP documented at 1400 and 1430 were both in the left arm (Pressures were 440 and 98 systolic). Pressures were lower than the right arm. Pt's son said to not take BP in left arm. BP monitor switched to right arm.

## 2014-04-22 NOTE — ED Notes (Signed)
MD at bedside. Respiratory setting up pt on Bipap.

## 2014-04-22 NOTE — ED Notes (Signed)
[place] PA-C from Cardiology at bedside

## 2014-04-22 NOTE — ED Notes (Signed)
Family practice at bedside.

## 2014-04-22 NOTE — Consult Note (Signed)
PULMONARY / CRITICAL CARE MEDICINE  Name: SHATERA RENNERT MRN: [id number] DOB: [birth year]    ADMISSION DATE:  Apr 25, 2014 CONSULTATION DATE:  2014/04/25  REFERRING MD :  EDP PRIMARY SERVICE:  PCCM  CHIEF COMPLAINT:  Chest pain, dyspnea  BRIEF PATIENT DESCRIPTION: 80 yo with multiple comorbidities brought to ED with chest pain and dyspnea.  In ED, troponins elevated, started on Heparin / Nitroglycerin gtt for ACS and placed on BiPAP.  PCCM was consulted.  SIGNIFICANT EVENTS / STUDIES:    LINES / TUBES: Foley 26-Apr-2023 >>>  CULTURES: 04-26-23 Blood  >>> 04/26/23 Urine  >>>  ANTIBIOTICS: Cefepime 04-26-2023 x 1 Vancomycin Apr 26, 2023 x 1 Ceftriaxone 5/21 >>> Azithro 5/21 >>>  HISTORY OF PRESENT ILLNESS:  Pt is encephalopathic; therefore, this HPI is obtained from chart review. Mrs. Riehl is a 80 y.o. F presenting with acute respiratory failure, found to have NSTEMI/possible CAP/CHF exacerbation. Pt was seen by PCP 3 weeks ago for some coughing and shortness of breath, dx with bronchitis, and given azithromycin and if no improvement in five days, to follow up.  Pt did not improve during that time and she came back, at which she was put on prednisone, which did give her some relief.  Pt was seen about one week prior to this admit and had some rhoncourous breath sounds, and recommended she take some mucinex, however, over the past two days, pt has not been able to take her medications, has not felt too well.  She was seen yesterday by nephrology (sees them for ESRD w/ unilateral kidney).  Over the past 24 hours, she has been describing worsening chest pain and shortness of breath along with diarrhea over the past 48 hours. Her chest pressure worsens with activity and does relieve with rest. Has also had worsening LE edema along with PND and has been inclining the bed up to 30 degrees or more to help her breath at night for the past 10 months or so. Denies any nausea or vomiting, dizziness, changes in vision, HA, fevers.  In  the ED, pt had multiple evaluations performed including CMET showing Creatinine of 4.85 (baseline 4-5), Troponin of 1.74 (2nd up to 2.38), BNP of 35[address].0, Lactic Acid of 4.10, leukocytosis to 16.8 w/ Left shift, Hgb of 8.3 (baseline around 9), ABG 7.25/24.9/69.  She was started on BiPap in the ED.  PCCM was consulted for respiratory failure.  PAST MEDICAL HISTORY :  Past Medical History  Diagnosis Date  . Stenosis, spinal, lumbar 05/20/2006    L 3-4, 4-5  MRI  . Bilateral sensorineural hearing loss 10/2006    audiogram  . Diabetes mellitus   . Chronic kidney disease   . Diverticulosis of colon   . H/O endoscopy 11/2006    benign duodenal scar  . Normal cardiac stress test 01/30/2006    Cardiolite EF 54%  . Abnormal barium swallow 12/2009    ?thyroid nodule  . Complication of anesthesia     ' I cant take propaphol "  . Hypertension   . Shortness of breath   . Asthma   . GERD (gastroesophageal reflux disease)   . Arthritis   . PONV (postoperative nausea and vomiting)   . Diverticulosis    Past Surgical History  Procedure Laterality Date  . Combined hysterectomy abdominal w/ a&p repair / oophorectomy  1960's    for endometriosis  . Cholecystectomy open  2002  . Basal cell carcinoma excision  06/2003    gluteal fold  . Retinal  laser procedure  10/2003    left eye  . Nephrectomy  12/2003    left due to non-function  . Minor breast biopsy  06/2010    right   . Esophageal dilation  10/2012    [place] [facility]  . Abdominal hysterectomy    . Esophagogastroduodenoscopy N/A 12/18/2013    Procedure: ESOPHAGOGASTRODUODENOSCOPY (EGD);  Surgeon: Missy Sabins, MD;  Location: [facility] ENDOSCOPY;  Service: Endoscopy;  Laterality: N/A;  . Colonoscopy N/A 12/19/2013    Procedure: COLONOSCOPY;  Surgeon: Missy Sabins, MD;  Location: [facility];  Service: Endoscopy;  Laterality: N/A;   Prior to Admission medications   Medication Sig Start Date End Date Taking? Authorizing Provider   acetaminophen (TYLENOL) 650 MG CR tablet Take 650 mg by mouth every 8 (eight) hours as needed for pain.   Yes Historical Provider, MD  albuterol (PROVENTIL HFA;VENTOLIN HFA) 108 (90 BASE) MCG/ACT inhaler Inhale 2 puffs into the lungs every 6 (six) hours as needed for wheezing or shortness of breath. 04/13/14  Yes Lupita Dawn, MD  allopurinol (ZYLOPRIM) 100 MG tablet take 1 tablet by mouth once daily 02/17/14  Yes Lupita Dawn, MD  aspirin 81 MG tablet Take 81 mg by mouth daily.   Yes Historical Provider, MD  CARBOXYMETHYLCELLULOSE SODIUM OP Place 1 drop into both eyes every 4 (four) hours as needed (dry eyes).   Yes Historical Provider, MD  fluticasone (FLONASE) 50 MCG/ACT nasal spray Place 2 sprays into both nostrils every morning.   Yes Historical Provider, MD  furosemide (LASIX) 20 MG tablet Take 1 tablet (20 mg total) by mouth daily. 04/13/14 04/13/15 Yes Lupita Dawn, MD  glucose blood test strip Test fasting and when symptoms of low blood sugar 03/21/11  Yes Candelaria Celeste, MD  hydrocortisone (PROCTOSOL HC) 2.5 % rectal cream Place 1 application rectally 2 (two) times daily. 04/13/14  Yes Lupita Dawn, MD  isosorbide mononitrate (IMDUR) 30 MG 24 hr tablet Take 1 tablet (30 mg total) by mouth daily. 01/09/14  Yes Lupita Dawn, MD  loperamide (IMODIUM) 2 MG capsule Take 2 mg by mouth 4 (four) times daily as needed.    Yes Historical Provider, MD  metoprolol succinate (TOPROL-XL) 50 MG 24 hr tablet Take 1 tablet (50 mg total) by mouth daily. Take with or immediately following a meal. 03/11/14  Yes Lupita Dawn, MD  NIFEdipine (PROCARDIA-XL/ADALAT CC) 30 MG 24 hr tablet Take 3 tablets (90 mg total) by mouth daily. 01/23/14  Yes Lupita Dawn, MD  omeprazole (PRILOSEC) 20 MG capsule Take 1 capsule (20 mg total) by mouth daily. 01/09/14  Yes Lupita Dawn, MD  polyethylene glycol (MIRALAX / GLYCOLAX) packet Take 17 g by mouth daily as needed for moderate constipation.   Yes Historical Provider, MD  predniSONE  (DELTASONE) 20 MG tablet Take 2 tablets (40 mg total) by mouth 2 (two) times daily with a meal. 04/06/14  Yes Dayarmys [place] de Gwendalyn Ege, MD  Pyridoxine HCl (VITAMIN B-6) 100 MG tablet Take 100 mg by mouth daily.    Yes Historical Provider, MD  simethicone (MYLICON) 80 MG chewable tablet Chew 80 mg by mouth every 6 (six) hours as needed for flatulence.   Yes Historical Provider, MD  simvastatin (ZOCOR) 20 MG tablet Take 20 mg by mouth daily.   Yes Historical Provider, MD  sodium bicarbonate 650 MG tablet Take 650 mg by mouth 2 (two) times daily. 2 tablets BID   Yes  Historical Provider, MD  traMADol (ULTRAM) 50 MG tablet Take 1 tablet (50 mg total) by mouth every 12 (twelve) hours as needed. 04/01/14  Yes Lupita Dawn, MD  Vitamin D, Ergocalciferol, (DRISDOL) 50000 UNITS CAPS capsule take 1 capsule by mouth every week 03/11/14  Yes Lupita Dawn, MD  busPIRone (BUSPAR) 15 MG tablet Take 7.5 mg by mouth 2 (two) times daily. 07/22/13   Candelaria Celeste, MD  nitroGLYCERIN (NITROSTAT) 0.4 MG SL tablet Place 0.4 mg under the tongue every 5 (five) minutes as needed for chest pain. 12/22/13   Lupita Dawn, MD  Phenol-Glycerin (CHLORASEPTIC MAX SORE THROAT) 1.5-33 % LIQD Use as directed 1-2 sprays in the mouth or throat every 6 (six) hours as needed (dry/sore throat).    Historical Provider, MD   Allergies  Allergen Reactions  . Fexofenadine Other (See Comments)    confusion  . Trazodone And Nefazodone     Lightheadedness  . Ciprofloxacin Other (See Comments)    REACTION: Headache and stomachache  . Diphenhydramine Hcl Other (See Comments)    REACTION: hyperactive  . Gabapentin Other (See Comments)    REACTION: leg pain  . Procaine Hcl     REACTION: Rash  . Propofol Diarrhea and Nausea And Vomiting    Had endoscopy at Dr Amedeo Plenty 10/2012  . Sulfamethoxazole-Trimethoprim     REACTION: Nausea  . Codeine Rash    REACTION: Rash  . Doxycycline Hyclate Rash    REACTION: rash?   FAMILY HISTORY:  Family History   Problem Relation Age of Onset  . Asthma Son   . Cancer Father     brain tumor  . Diabetes Father   . Cancer Mother     liver   SOCIAL HISTORY:  reports that she has never smoked. She has never used smokeless tobacco. She reports that she does not drink alcohol or use illicit drugs.   REVIEW OF SYSTEMS: All negative; except for those that are bolded, which indicate positives.  Constitutional: weight loss, weight gain, night sweats, fevers, chills, fatigue, weakness.  HEENT: headaches, sore throat, sneezing, nasal congestion, post nasal drip, difficulty swallowing, tooth/dental problems, visual complaints, visual changes, ear aches. Neuro: difficulty with speech, weakness, numbness, ataxia. CV:  chest pain, orthopnea, PND, swelling in lower extremities, dizziness, palpitations, syncope.  Resp: cough, hemoptysis, dyspnea, wheezing. GI  heartburn, indigestion, abdominal pain, nausea, vomiting, diarrhea, constipation, change in bowel habits, loss of appetite, hematemesis, melena, hematochezia.  GU: dysuria, change in color of urine, urgency or frequency, flank pain, hematuria. MSK: joint pain or swelling, decreased range of motion. Psych: change in mood or affect, depression, anxiety, suicidal ideations, homicidal ideations. Skin: rash, itching, bruising.  SUBJECTIVE:  VITAL SIGNS: Pulse Rate:  [117-133] 125 (05/20 1715) Resp:  [15-38] 22 (05/20 1715) BP: (98-163)/(46-83) 149/73 mmHg (05/20 1715) SpO2:  [90 %-100 %] 96 % (05/20 1715) FiO2 (%):  [60 %-100 %] 60 % (05/20 1520) Weight:  [61.689 kg (136 lb)] 61.689 kg (136 lb) (05/20 1229)  HEMODYNAMICS:   VENTILATOR SETTINGS: Vent Mode:  [-] BIPAP FiO2 (%):  [60 %-100 %] 60 %  INTAKE / OUTPUT: Intake/Output   None    PHYSICAL EXAMINATION: General: Chronically ill appearing, resting in bed, very anxious. Neuro: A&O x 3, non-focal.  HEENT: [place]/AT. PERRL, sclerae anicteric. Cardiovascular: Distant heart tones.  No M/R/G  appreciated. Lungs: Respirations shallow.  Scattered rhonchi and crackles with some intermittent wheezing. On BiPAP. Abdomen: BS x 4, soft, NT/ND.  Musculoskeletal: No gross  deformities, no edema.  Skin: Intact, warm, no rashes.  LABS: CBC  Recent Labs Lab 04/22/14 1310  WBC 16.8*  HGB 8.3*  HCT 24.1*  PLT 131*   Coag's No results found for this basename: APTT, INR,  in the last 168 hours  BMET  Recent Labs Lab 04/22/14 1310  NA 143  K 4.9  CL 108  CO2 12*  BUN 64*  CREATININE 4.85*  GLUCOSE 176*   Electrolytes  Recent Labs Lab 04/22/14 1310  CALCIUM 9.6   Sepsis Markers  Recent Labs Lab 04/22/14 1328  LATICACIDVEN 4.10*   ABG  Recent Labs Lab 04/22/14 1345  PHART 7.257*  PCO2ART 24.9*  PO2ART 69.0*   Liver Enzymes  Recent Labs Lab 04/22/14 1310  AST 57*  ALT 41*  ALKPHOS 212*  BILITOT 0.7  ALBUMIN 3.0*   Cardiac Enzymes  Recent Labs Lab 04/22/14 1310 04/22/14 1440  TROPONINI 1.74* 2.38*  PROBNP 35772.0*  --    Glucose No results found for this basename: GLUCAP,  in the last 168 hours  IMAGING:  No results found.  ASSESSMENT / PLAN:  Acute respiratory failure in setting of NSTEMI Acute on chronic diastolic congestive heart failure Pulmonary edema Shock ( cardiogenic, less likely septic ) Possible pneumonia (CAP) Increased anion gap acidosis CKD DNR / DNI - this was confirmed with HCPOA ( son )   BiPAP  Goal SpO2>92  ASA, statin, BB  ACEI contraindicated - renal impairment  Heparin gtt / Nitro gtt  Lasix with goal negative balance  TTE to evaluate for RWMA  Trend lactate / troponin  Ceftriaxone / Azithromycin   Legionella urine Ag  PCT  Change Albuterol to PRN only  Trend CXR  Montey Hora, PA - [facility] Pgr: [phone]  or [phone]  Appears to be appropriate for SDU admission under FMTS.  PCCM will follow.  I have personally obtained history, examined patient,  evaluated and interpreted laboratory and imaging results, reviewed medical records, formulated assessment / plan and placed orders.  CRITICAL CARE:  The patient is critically ill with multiple organ systems failure and requires high complexity decision making for assessment and support, frequent evaluation and titration of therapies, application of advanced monitoring technologies and extensive interpretation of multiple databases. Critical Care Time devoted to patient care services described in this note is 40 minutes.   Doree Fudge, MD [facility] [place] Pager: [phone]  04/22/2014, 6:48 PM

## 2014-04-22 NOTE — ED Notes (Signed)
Pt burping- states she has acid reflux

## 2014-04-22 NOTE — ED Notes (Signed)
Spoke with Dr Marlyne Beards. Updated MD on pt's condition. No new orders

## 2014-04-22 NOTE — ED Notes (Signed)
Pt resting in bed. States she is feeling better.

## 2014-04-22 NOTE — ED Notes (Signed)
Pt has been having chest pain and SOB for several hours- and no relief. Pt diagnosed with acute bronchitis April 28th. Pt states she feels she has not gotten over it. Pt given albuterol and atrovent, 125mg  solu medrol by EMS. Pt was 89% on room air when EMS arrived. After treatment, pt was 98% on nonrebreather. Tried pt on 4L [place] and sats dropped to 89%. EMS placed back on nonrebreather. Pt lung sounds ronchi/wet. Pt received 324 ASA at home, Pt also took 1 nitro at home. HR 130's RBB in SR. BP 170/82. CBG 220. Pt is alert and oriented

## 2014-04-22 NOTE — ED Provider Notes (Signed)
CSN: [id number]     Arrival date & time 04/22/14  1209 History   First MD Initiated Contact with Patient 04/22/14 1223     Chief Complaint  Patient presents with  . Shortness of Breath  . Chest Pain     (Consider location/radiation/quality/duration/timing/severity/associated sxs/prior Treatment) The history is provided by the patient, a relative and the EMS personnel.   80 year old female followed by family practice. Patient having trouble with shortness of breath for the past several weeks. End of April was treated with as a Z-Pak which went into may also treated recently with prednisone. Patient probably developed chest pain on Sunday he didn't really tell family members. They noted that she was having a lot of trouble breathing today and had chest pain so they called EMS to bring her in. Patient arrived here tachycardic hypertensive on her percent nonrebreather oxygen saturation's were in the mid to upper 90s. But off of oxygen patient would desat below 90%. Patient was also tachypnea. Patient denied any fever. Negative chest pain substernal stated probably been there since Sunday. Patient never was hypotensive. He had some outlined blood pressures that were not accurate but never was truly hypotensive. No nausea no vomiting. Patient was working hard to breathe upon arrival but was mentating fine. No wheezing heard by me. We tried her on a duo neb when she first got there didn't seem to make much difference. Patient seemed to have bilateral rales. Patient has a history of coronary artery disease and has stents. Patient is followed by [facility] cardiology.  Past Medical History  Diagnosis Date  . Stenosis, spinal, lumbar 05/20/2006    L 3-4, 4-5  MRI  . Bilateral sensorineural hearing loss 10/2006    audiogram  . Diabetes mellitus   . Chronic kidney disease   . Diverticulosis of colon   . H/O endoscopy 11/2006    benign duodenal scar  . Normal cardiac stress test 01/30/2006    Cardiolite EF 54%   . Abnormal barium swallow 12/2009    ?thyroid nodule  . Complication of anesthesia     ' I cant take propaphol "  . Hypertension   . Shortness of breath   . Asthma   . GERD (gastroesophageal reflux disease)   . Arthritis   . PONV (postoperative nausea and vomiting)   . Diverticulosis    Past Surgical History  Procedure Laterality Date  . Combined hysterectomy abdominal w/ a&p repair / oophorectomy  1960's    for endometriosis  . Cholecystectomy open  2002  . Basal cell carcinoma excision  06/2003    gluteal fold  . Retinal laser procedure  10/2003    left eye  . Nephrectomy  12/2003    left due to non-function  . Minor breast biopsy  06/2010    right   . Esophageal dilation  10/2012    [place] [facility] Amedeo Plenty  . Abdominal hysterectomy    . Esophagogastroduodenoscopy N/A 12/18/2013    Procedure: ESOPHAGOGASTRODUODENOSCOPY (EGD);  Surgeon: Missy Sabins, MD;  Location: [facility] ENDOSCOPY;  Service: Endoscopy;  Laterality: N/A;  . Colonoscopy N/A 12/19/2013    Procedure: COLONOSCOPY;  Surgeon: Missy Sabins, MD;  Location: [place];  Service: Endoscopy;  Laterality: N/A;   Family History  Problem Relation Age of Onset  . Asthma Son   . Cancer Father     brain tumor  . Diabetes Father   . Cancer Mother     liver   History  Substance Use  Topics  . Smoking status: Never Smoker   . Smokeless tobacco: Never Used  . Alcohol Use: No   OB History   Grav Para Term Preterm Abortions TAB SAB Ect Mult Living                 Review of Systems  Constitutional: Negative for fever.  HENT: Negative for congestion.   Eyes: Negative for visual disturbance.  Respiratory: Positive for shortness of breath.   Cardiovascular: Positive for chest pain.  Gastrointestinal: Negative for abdominal pain.  Genitourinary: Negative for dysuria.  Musculoskeletal: Negative for back pain.  Skin: Negative for rash.  Neurological: Negative for headaches.  Hematological: Does not bruise/bleed easily.   Psychiatric/Behavioral: Negative for confusion.      Allergies  Fexofenadine; Trazodone and nefazodone; Ciprofloxacin; Diphenhydramine hcl; Gabapentin; Procaine hcl; Propofol; Sulfamethoxazole-trimethoprim; Codeine; and Doxycycline hyclate  Home Medications   Prior to Admission medications   Medication Sig Start Date End Date Taking? Authorizing Provider  acetaminophen (TYLENOL) 650 MG CR tablet Take 650 mg by mouth every 8 (eight) hours as needed for pain.    Historical Provider, MD  albuterol (PROVENTIL HFA;VENTOLIN HFA) 108 (90 BASE) MCG/ACT inhaler Inhale 2 puffs into the lungs every 6 (six) hours as needed for wheezing or shortness of breath. 04/13/14   Lupita Dawn, MD  allopurinol (ZYLOPRIM) 100 MG tablet take 1 tablet by mouth once daily 02/17/14   Lupita Dawn, MD  aspirin 81 MG tablet Take 81 mg by mouth daily.    Historical Provider, MD  busPIRone (BUSPAR) 15 MG tablet Take 7.5 mg by mouth 2 (two) times daily. 07/22/13   Candelaria Celeste, MD  CARBOXYMETHYLCELLULOSE SODIUM OP Place 1 drop into both eyes every 4 (four) hours as needed (dry eyes).    Historical Provider, MD  fluticasone (FLONASE) 50 MCG/ACT nasal spray Place 2 sprays into both nostrils every morning.    Historical Provider, MD  furosemide (LASIX) 20 MG tablet Take 1 tablet (20 mg total) by mouth daily. 04/13/14 04/13/15  Lupita Dawn, MD  glucose blood test strip Test fasting and when symptoms of low blood sugar 03/21/11   Candelaria Celeste, MD  hydrocortisone (PROCTOSOL HC) 2.5 % rectal cream Place 1 application rectally 2 (two) times daily. 04/13/14   Lupita Dawn, MD  isosorbide mononitrate (IMDUR) 30 MG 24 hr tablet Take 1 tablet (30 mg total) by mouth daily. 01/09/14   Lupita Dawn, MD  loperamide (IMODIUM) 2 MG capsule Take 2 mg by mouth 4 (four) times daily as needed.     Historical Provider, MD  metoprolol succinate (TOPROL-XL) 50 MG 24 hr tablet Take 1 tablet (50 mg total) by mouth daily. Take with or immediately following  a meal. 03/11/14   Lupita Dawn, MD  NIFEdipine (PROCARDIA-XL/ADALAT CC) 30 MG 24 hr tablet Take 3 tablets (90 mg total) by mouth daily. 01/23/14   Lupita Dawn, MD  nitroGLYCERIN (NITROSTAT) 0.4 MG SL tablet Place 0.4 mg under the tongue every 5 (five) minutes as needed for chest pain. 12/22/13   Lupita Dawn, MD  omeprazole (PRILOSEC) 20 MG capsule Take 1 capsule (20 mg total) by mouth daily. 01/09/14   Lupita Dawn, MD  Phenol-Glycerin (CHLORASEPTIC MAX SORE THROAT) 1.5-33 % LIQD Use as directed 1-2 sprays in the mouth or throat every 6 (six) hours as needed (dry/sore throat).    Historical Provider, MD  polyethylene glycol powder (GLYCOLAX/MIRALAX) powder MIX 17 GRAMS WITH 4 OUNCES OF  WATER OR TEA EVERY DAY IF NEEDED 01/05/14   Uvaldo Rising, MD  predniSONE (DELTASONE) 20 MG tablet Take 2 tablets (40 mg total) by mouth 2 (two) times daily with a meal. 04/06/14   Dayarmys Piloto de Criselda Peaches, MD  Pyridoxine HCl (VITAMIN B-6) 100 MG tablet Take 100 mg by mouth daily.     Historical Provider, MD  simethicone (MYLICON) 80 MG chewable tablet Chew 80 mg by mouth every 6 (six) hours as needed for flatulence.    Historical Provider, MD  simvastatin (ZOCOR) 20 MG tablet Take 20 mg by mouth daily.    Historical Provider, MD  sodium bicarbonate 650 MG tablet Take 650 mg by mouth 2 (two) times daily. 2 tablets BID    Historical Provider, MD  traMADol (ULTRAM) 50 MG tablet Take 1 tablet (50 mg total) by mouth every 12 (twelve) hours as needed. 04/01/14   Uvaldo Rising, MD  Vitamin D, Ergocalciferol, (DRISDOL) 50000 UNITS CAPS capsule take 1 capsule by mouth every week 03/11/14   Uvaldo Rising, MD   BP 150/83  Pulse 125  Resp 25  Ht  (1.499 m)  Wt 136 lb (61.689 kg)  BMI 27.45 kg/m2  SpO2 97% Physical Exam  Nursing note and vitals reviewed. Constitutional: She is oriented to person, place, and time. She appears well-developed and well-nourished. She appears distressed.  HENT:  Head: Normocephalic and  atraumatic.  Mouth/Throat: Oropharynx is clear and moist.  Eyes: Conjunctivae and EOM are normal. Pupils are equal, round, and reactive to light.  Neck: Normal range of motion.  Cardiovascular: Regular rhythm.   Tachycardic  Pulmonary/Chest: She is in respiratory distress. She has wheezes. She has rales.  Abdominal: Soft. Bowel sounds are normal. There is no tenderness.  Musculoskeletal: Normal range of motion. She exhibits edema.  Neurological: She is alert and oriented to person, place, and time. No cranial nerve deficit. Coordination normal.  Skin: Skin is warm. No rash noted.    ED Course  Procedures (including critical care time) Labs Review Labs Reviewed  PRO B NATRIURETIC PEPTIDE - Abnormal; Notable for the following:    Pro B Natriuretic peptide (BNP) 35772.0 (*)    All other components within normal limits  TROPONIN I - Abnormal; Notable for the following:    Troponin I 1.74 (*)    All other components within normal limits  CBC WITH DIFFERENTIAL - Abnormal; Notable for the following:    WBC 16.8 (*)    RBC 2.46 (*)    Hemoglobin 8.3 (*)    HCT 24.1 (*)    RDW 16.1 (*)    Platelets 131 (*)    Neutrophils Relative % 95 (*)    Neutro Abs 15.9 (*)    Lymphocytes Relative 1 (*)    Lymphs Abs 0.2 (*)    All other components within normal limits  COMPREHENSIVE METABOLIC PANEL - Abnormal; Notable for the following:    CO2 12 (*)    Glucose, Bld 176 (*)    BUN 64 (*)    Creatinine, Ser 4.85 (*)    Albumin 3.0 (*)    AST 57 (*)    ALT 41 (*)    Alkaline Phosphatase 212 (*)    GFR calc non Af Amer 7 (*)    GFR calc Af Amer 9 (*)    All other components within normal limits  TROPONIN I - Abnormal; Notable for the following:    Troponin I 2.38 (*)  All other components within normal limits  URINALYSIS, ROUTINE W REFLEX MICROSCOPIC - Abnormal; Notable for the following:    APPearance CLOUDY (*)    Glucose, UA 100 (*)    Hgb urine dipstick SMALL (*)    Protein, ur  >300 (*)    All other components within normal limits  URINE MICROSCOPIC-ADD ON - Abnormal; Notable for the following:    Casts HYALINE CASTS (*)    All other components within normal limits  I-STAT CG4 LACTIC ACID, ED - Abnormal; Notable for the following:    Lactic Acid, Venous 4.10 (*)    All other components within normal limits  I-STAT ARTERIAL BLOOD GAS, ED - Abnormal; Notable for the following:    pH, Arterial 7.257 (*)    pCO2 arterial 24.9 (*)    pO2, Arterial 69.0 (*)    Bicarbonate 11.1 (*)    Acid-base deficit 14.0 (*)    All other components within normal limits  CULTURE, BLOOD (ROUTINE X 2)  CULTURE, BLOOD (ROUTINE X 2)  URINE CULTURE  HEPARIN LEVEL (UNFRACTIONATED)  CBC   Results for orders placed during the hospital encounter of 04/22/14  PRO B NATRIURETIC PEPTIDE      Result Value Ref Range   Pro B Natriuretic peptide (BNP) 35772.0 (*) 0 - 450 pg/mL  TROPONIN I      Result Value Ref Range   Troponin I 1.74 (*) <0.30 ng/mL  CBC WITH DIFFERENTIAL      Result Value Ref Range   WBC 16.8 (*) 4.0 - 10.5 K/uL   RBC 2.46 (*) 3.87 - 5.11 MIL/uL   Hemoglobin 8.3 (*) 12.0 - 15.0 g/dL   HCT 24.1 (*) 36.0 - 46.0 %   MCV 98.0  78.0 - 100.0 fL   MCH 33.7  26.0 - 34.0 pg   MCHC 34.4  30.0 - 36.0 g/dL   RDW 16.1 (*) 11.5 - 15.5 %   Platelets 131 (*) 150 - 400 K/uL   Neutrophils Relative % 95 (*) 43 - 77 %   Neutro Abs 15.9 (*) 1.7 - 7.7 K/uL   Lymphocytes Relative 1 (*) 12 - 46 %   Lymphs Abs 0.2 (*) 0.7 - 4.0 K/uL   Monocytes Relative 4  3 - 12 %   Monocytes Absolute 0.6  0.1 - 1.0 K/uL   Eosinophils Relative 0  0 - 5 %   Eosinophils Absolute 0.0  0.0 - 0.7 K/uL   Basophils Relative 0  0 - 1 %   Basophils Absolute 0.0  0.0 - 0.1 K/uL  COMPREHENSIVE METABOLIC PANEL      Result Value Ref Range   Sodium 143  137 - 147 mEq/L   Potassium 4.9  3.7 - 5.3 mEq/L   Chloride 108  96 - 112 mEq/L   CO2 12 (*) 19 - 32 mEq/L   Glucose, Bld 176 (*) 70 - 99 mg/dL   BUN 64 (*)  6 - 23 mg/dL   Creatinine, Ser 4.85 (*) 0.50 - 1.10 mg/dL   Calcium 9.6  8.4 - 10.5 mg/dL   Total Protein 6.2  6.0 - 8.3 g/dL   Albumin 3.0 (*) 3.5 - 5.2 g/dL   AST 57 (*) 0 - 37 U/L   ALT 41 (*) 0 - 35 U/L   Alkaline Phosphatase 212 (*) 39 - 117 U/L   Total Bilirubin 0.7  0.3 - 1.2 mg/dL   GFR calc non Af Amer 7 (*) >90 mL/min   GFR  calc Af Amer 9 (*) >90 mL/min  TROPONIN I      Result Value Ref Range   Troponin I 2.38 (*) <0.30 ng/mL  URINALYSIS, ROUTINE W REFLEX MICROSCOPIC      Result Value Ref Range   Color, Urine YELLOW  YELLOW   APPearance CLOUDY (*) CLEAR   Specific Gravity, Urine 1.018  1.005 - 1.030   pH 5.0  5.0 - 8.0   Glucose, UA 100 (*) NEGATIVE mg/dL   Hgb urine dipstick SMALL (*) NEGATIVE   Bilirubin Urine NEGATIVE  NEGATIVE   Ketones, ur NEGATIVE  NEGATIVE mg/dL   Protein, ur >914 (*) NEGATIVE mg/dL   Urobilinogen, UA 0.2  0.0 - 1.0 mg/dL   Nitrite NEGATIVE  NEGATIVE   Leukocytes, UA NEGATIVE  NEGATIVE  URINE MICROSCOPIC-ADD ON      Result Value Ref Range   Squamous Epithelial / LPF RARE  RARE   WBC, UA 3-6  <3 WBC/hpf   Bacteria, UA RARE  RARE   Casts HYALINE CASTS (*) NEGATIVE   Urine-Other AMORPHOUS URATES/PHOSPHATES    I-STAT CG4 LACTIC ACID, ED      Result Value Ref Range   Lactic Acid, Venous 4.10 (*) 0.5 - 2.2 mmol/L  I-STAT ARTERIAL BLOOD GAS, ED      Result Value Ref Range   pH, Arterial 7.257 (*) 7.350 - 7.450   pCO2 arterial 24.9 (*) 35.0 - 45.0 mmHg   pO2, Arterial 69.0 (*) 80.0 - 100.0 mmHg   Bicarbonate 11.1 (*) 20.0 - 24.0 mEq/L   TCO2 12  0 - 100 mmol/L   O2 Saturation 91.0     Acid-base deficit 14.0 (*) 0.0 - 2.0 mmol/L   Collection site BRACHIAL ARTERY     Drawn by Operator     Sample type ARTERIAL     Results for orders placed during the hospital encounter of 04/22/14  PRO B NATRIURETIC PEPTIDE      Result Value Ref Range   Pro B Natriuretic peptide (BNP) 35772.0 (*) 0 - 450 pg/mL  TROPONIN I      Result Value Ref Range    Troponin I 1.74 (*) <0.30 ng/mL  CBC WITH DIFFERENTIAL      Result Value Ref Range   WBC 16.8 (*) 4.0 - 10.5 K/uL   RBC 2.46 (*) 3.87 - 5.11 MIL/uL   Hemoglobin 8.3 (*) 12.0 - 15.0 g/dL   HCT 78.2 (*) 95.6 - 21.3 %   MCV 98.0  78.0 - 100.0 fL   MCH 33.7  26.0 - 34.0 pg   MCHC 34.4  30.0 - 36.0 g/dL   RDW 08.6 (*) 57.8 - 46.9 %   Platelets 131 (*) 150 - 400 K/uL   Neutrophils Relative % 95 (*) 43 - 77 %   Neutro Abs 15.9 (*) 1.7 - 7.7 K/uL   Lymphocytes Relative 1 (*) 12 - 46 %   Lymphs Abs 0.2 (*) 0.7 - 4.0 K/uL   Monocytes Relative 4  3 - 12 %   Monocytes Absolute 0.6  0.1 - 1.0 K/uL   Eosinophils Relative 0  0 - 5 %   Eosinophils Absolute 0.0  0.0 - 0.7 K/uL   Basophils Relative 0  0 - 1 %   Basophils Absolute 0.0  0.0 - 0.1 K/uL  COMPREHENSIVE METABOLIC PANEL      Result Value Ref Range   Sodium 143  137 - 147 mEq/L   Potassium 4.9  3.7 - 5.3 mEq/L  Chloride 108  96 - 112 mEq/L   CO2 12 (*) 19 - 32 mEq/L   Glucose, Bld 176 (*) 70 - 99 mg/dL   BUN 64 (*) 6 - 23 mg/dL   Creatinine, Ser 4.85 (*) 0.50 - 1.10 mg/dL   Calcium 9.6  8.4 - 10.5 mg/dL   Total Protein 6.2  6.0 - 8.3 g/dL   Albumin 3.0 (*) 3.5 - 5.2 g/dL   AST 57 (*) 0 - 37 U/L   ALT 41 (*) 0 - 35 U/L   Alkaline Phosphatase 212 (*) 39 - 117 U/L   Total Bilirubin 0.7  0.3 - 1.2 mg/dL   GFR calc non Af Amer 7 (*) >90 mL/min   GFR calc Af Amer 9 (*) >90 mL/min  TROPONIN I      Result Value Ref Range   Troponin I 2.38 (*) <0.30 ng/mL  URINALYSIS, ROUTINE W REFLEX MICROSCOPIC      Result Value Ref Range   Color, Urine YELLOW  YELLOW   APPearance CLOUDY (*) CLEAR   Specific Gravity, Urine 1.018  1.005 - 1.030   pH 5.0  5.0 - 8.0   Glucose, UA 100 (*) NEGATIVE mg/dL   Hgb urine dipstick SMALL (*) NEGATIVE   Bilirubin Urine NEGATIVE  NEGATIVE   Ketones, ur NEGATIVE  NEGATIVE mg/dL   Protein, ur >300 (*) NEGATIVE mg/dL   Urobilinogen, UA 0.2  0.0 - 1.0 mg/dL   Nitrite NEGATIVE  NEGATIVE   Leukocytes, UA  NEGATIVE  NEGATIVE  URINE MICROSCOPIC-ADD ON      Result Value Ref Range   Squamous Epithelial / LPF RARE  RARE   WBC, UA 3-6  <3 WBC/hpf   Bacteria, UA RARE  RARE   Casts HYALINE CASTS (*) NEGATIVE   Urine-Other AMORPHOUS URATES/PHOSPHATES    I-STAT CG4 LACTIC ACID, ED      Result Value Ref Range   Lactic Acid, Venous 4.10 (*) 0.5 - 2.2 mmol/L  I-STAT ARTERIAL BLOOD GAS, ED      Result Value Ref Range   pH, Arterial 7.257 (*) 7.350 - 7.450   pCO2 arterial 24.9 (*) 35.0 - 45.0 mmHg   pO2, Arterial 69.0 (*) 80.0 - 100.0 mmHg   Bicarbonate 11.1 (*) 20.0 - 24.0 mEq/L   TCO2 12  0 - 100 mmol/L   O2 Saturation 91.0     Acid-base deficit 14.0 (*) 0.0 - 2.0 mmol/L   Collection site BRACHIAL ARTERY     Drawn by Operator     Sample type ARTERIAL     Dg Chest 2 View  04/06/2014   CLINICAL DATA:  Dyspnea, cough, bronchitis  EXAM: CHEST  2 VIEW  COMPARISON:  12/17/2013  FINDINGS: Cardiomediastinal silhouette is stable. Mild elevation of the right hemidiaphragm again noted. No acute infiltrate or pleural effusion. No pulmonary edema. Degenerative changes right AC joint. Degenerative changes lower thoracic spine.  IMPRESSION: No active cardiopulmonary disease.   Electronically Signed   By: Lahoma Crocker M.D.   On: 04/06/2014 13:26   Dg Chest Port 1 View  04/22/2014   CLINICAL DATA:  Shortness of breath and weakness.  Chest pain.  EXAM: PORTABLE CHEST - 1 VIEW  COMPARISON:  04/06/2014  FINDINGS: Cardiac silhouette is mildly enlarged. The lungs are hypoinflated. There are new bilateral perihilar opacities, right greater than left. No definite pleural effusion or pneumothorax is identified. No acute osseous abnormality is seen.  IMPRESSION: Bilateral perihilar opacities, which may reflect edema or possibly multifocal  pneumonia.   Electronically Signed   By: Logan Bores   On: 04/22/2014 14:38       Imaging Review Dg Chest Port 1 View  04/22/2014   CLINICAL DATA:  Shortness of breath and weakness.   Chest pain.  EXAM: PORTABLE CHEST - 1 VIEW  COMPARISON:  04/06/2014  FINDINGS: Cardiac silhouette is mildly enlarged. The lungs are hypoinflated. There are new bilateral perihilar opacities, right greater than left. No definite pleural effusion or pneumothorax is identified. No acute osseous abnormality is seen.  IMPRESSION: Bilateral perihilar opacities, which may reflect edema or possibly multifocal pneumonia.   Electronically Signed   By: Logan Bores   On: 04/22/2014 14:38     EKG Interpretation   Date/Time:  Wednesday Apr 22 2014 12:15:08 EDT Ventricular Rate:  131 PR Interval:  136 QRS Duration: 125 QT Interval:  351 QTC Calculation: 518 R Axis:   172 Text Interpretation:  Sinus tachycardia Right bundle branch block Abnormal  lateral Q waves ST depr, consider ischemia, inferior leads Baseline wander  in lead(s) II aVR aVF Artifact No significant change since last tracing  Confirmed by Kimm Ungaro  MD, Amazing Cowman [phone]) on 04/22/2014 12:38:44 PM      CRITICAL CARE Performed by: Mervin Kung Total critical care time: 45 Critical care time was exclusive of separately billable procedures and treating other patients. Critical care was necessary to treat or prevent imminent or life-threatening deterioration. Critical care was time spent personally by me on the following activities: development of treatment plan with patient and/or surrogate as well as nursing, discussions with consultants, evaluation of patient's response to treatment, examination of patient, obtaining history from patient or surrogate, ordering and performing treatments and interventions, ordering and review of laboratory studies, ordering and review of radiographic studies, pulse oximetry and re-evaluation of patient's condition.     MDM   Final diagnoses:  Non-STEMI (non-ST elevated myocardial infarction)  CHF (congestive heart failure)  HCAP (healthcare-associated pneumonia)    Patient discussed with family  practice is her primary care service as well as cardiology. Patient presented with multiple findings here today. To include the evidence of a acute myocardial infarction without any ST changes on EKG superficially and 9 STEMI or perhaps CHF and also perhaps a pneumonia. Also respiratory distress requiring BiPAP.  Patient had an elevated lactic acid had an arterial blood gas that showed acidosis but had a normal PCO2 oxygen was low but was in the 60 range while on her percent nonrebreather. Patient started to tire so was started on BiPAP. Chest x-ray raises concerns for pneumonia treated as if this if it would be healthcare acquired pneumonia.  The pneumonia patient was started on HCAP  Protocol after blood cultures.  Of further non-STEMI patient was started on heparin was given morphine for consideration for starting of nitroglycerin drip but chest pain did improve some with the morphine.  4 the pulmonary edema patient was given Lasix and the morphine.  Patient is to work late probably had chest pain that started on Monday or Sunday so been present for several days. She been followed by family medicine for shortness of breath for the past several weeks it did on prednisone and a Z-Pak.  Also in addition from a cardiac standpoint. Patient's past history has known coronary disease and has stents. Followed by [facility] cardiology.  Mervin Kung, MD 04/22/14 [phone]

## 2014-04-22 NOTE — ED Notes (Signed)
Lactic acid results given to Dr. Rogene Houston

## 2014-04-22 NOTE — Progress Notes (Signed)
ANTICOAGULATION + ANTIBIOTIC CONSULT NOTE - Initial Consult  Pharmacy Consult for heparin, vancomycin Indication: chest pain/ACS and pneumonia  Allergies  Allergen Reactions  . Fexofenadine Other (See Comments)    confusion  . Trazodone And Nefazodone     Lightheadedness  . Ciprofloxacin Other (See Comments)    REACTION: Headache and stomachache  . Diphenhydramine Hcl Other (See Comments)    REACTION: hyperactive  . Gabapentin Other (See Comments)    REACTION: leg pain  . Procaine Hcl     REACTION: Rash  . Propofol Diarrhea and Nausea And Vomiting    Had endoscopy at Dr Amedeo Plenty 10/2012  . Sulfamethoxazole-Trimethoprim     REACTION: Nausea  . Codeine Rash    REACTION: Rash  . Doxycycline Hyclate Rash    REACTION: rash?    Patient Measurements: Height: 4\' 11"  (149.9 cm) Weight: 136 lb (61.689 kg) IBW/kg (Calculated) : 43.2 Heparin Dosing Weight: 58.3kg  Vital Signs: Temp src: Oral (05/20 1229) BP: 152/78 mmHg (05/20 1441) Pulse Rate: 117 (05/20 1430)  Labs:  Recent Labs  04/22/14 1310  HGB 8.3*  HCT 24.1*  PLT 131*  CREATININE 4.85*  TROPONINI 1.74*    Estimated Creatinine Clearance: 6.9 ml/min (by C-G formula based on Cr of 4.85).   Medical History: Past Medical History  Diagnosis Date  . Stenosis, spinal, lumbar 05/20/2006    L 3-4, 4-5  MRI  . Bilateral sensorineural hearing loss 10/2006    audiogram  . Diabetes mellitus   . Chronic kidney disease   . Diverticulosis of colon   . H/O endoscopy 11/2006    benign duodenal scar  . Normal cardiac stress test 01/30/2006    Cardiolite EF 54%  . Abnormal barium swallow 12/2009    ?thyroid nodule  . Complication of anesthesia     ' I cant take propaphol "  . Hypertension   . Shortness of breath   . Asthma   . GERD (gastroesophageal reflux disease)   . Arthritis   . PONV (postoperative nausea and vomiting)   . Diverticulosis     Medications:  Infusions:  . [START ON 04/23/2014] sodium chloride 50  mL/hr at 04/22/14 1519  . ceFEPime (MAXIPIME) IV    . heparin    . heparin    . vancomycin    . [START ON 04/24/2014] vancomycin     Anti-infectives   Start     Dose/Rate Route Frequency Ordered Stop   04/24/14 1800  vancomycin (VANCOCIN) IVPB 1000 mg/200 mL premix     1,000 mg 200 mL/hr over 60 Minutes Intravenous Every 48 hours 04/22/14 1538     04/22/14 1500  ceFEPIme (MAXIPIME) 1 g in dextrose 5 % 50 mL IVPB     1 g 100 mL/hr over 30 Minutes Intravenous  Once 04/22/14 1447     04/22/14 1500  vancomycin (VANCOCIN) IVPB 1000 mg/200 mL premix     1,000 mg 200 mL/hr over 60 Minutes Intravenous  Once 04/22/14 1459        Assessment: 74 yof presented to the ED with CP and SOB. Troponin is elevated. To start IV heparin for possible ACS and vancomycin for pneumonia. MD ordered a dose of cefepime in the the ED. Pt has very poor renal fxn. Baseline H/H, plts are slightly low. She is now on any anticoagulants PTA.   Vanc 5/20>> Cefepime x 1 5/20  Goal of Therapy:  Heparin level 0.3-0.7 units/ml Monitor platelets by anticoagulation protocol: Yes Vanc trough 15-20  Plan:  1. Heparin bolus 3500 units IV x 1 2. Heparin gtt 700 units/hr 3. Check an 8 hour heparin level  4. Daily heparin level and CBC 5. Vancomycin 1gm IV Q48H  6. F/u continuation of cefepime 7. F/u renal fxn, C&S, clinical status and trough at [place] 04/22/2014,3:39 PM

## 2014-04-22 NOTE — ED Notes (Signed)
Pt still on Bipap tolerating well. Sats 97%. Family at bedside

## 2014-04-22 NOTE — ED Notes (Signed)
CC MD at bedside- ordered to go ahead and give abx since one set of blood cultures has been completed.

## 2014-04-23 ENCOUNTER — Inpatient Hospital Stay (HOSPITAL_COMMUNITY): Payer: Medicare Other

## 2014-04-23 ENCOUNTER — Encounter (HOSPITAL_COMMUNITY): Payer: Self-pay | Admitting: *Deleted

## 2014-04-23 DIAGNOSIS — I498 Other specified cardiac arrhythmias: Secondary | ICD-10-CM

## 2014-04-23 DIAGNOSIS — J9600 Acute respiratory failure, unspecified whether with hypoxia or hypercapnia: Secondary | ICD-10-CM

## 2014-04-23 DIAGNOSIS — J209 Acute bronchitis, unspecified: Secondary | ICD-10-CM

## 2014-04-23 DIAGNOSIS — R092 Respiratory arrest: Secondary | ICD-10-CM

## 2014-04-23 LAB — LEGIONELLA ANTIGEN, URINE: LEGIONELLA ANTIGEN, URINE: NEGATIVE

## 2014-04-23 LAB — HIV ANTIBODY (ROUTINE TESTING W REFLEX): HIV 1&2 Ab, 4th Generation: NONREACTIVE

## 2014-04-23 LAB — BASIC METABOLIC PANEL
BUN: 70 mg/dL — ABNORMAL HIGH (ref 6–23)
CO2: 11 mEq/L — ABNORMAL LOW (ref 19–32)
Calcium: 8.9 mg/dL (ref 8.4–10.5)
Chloride: 110 mEq/L (ref 96–112)
Creatinine, Ser: 4.93 mg/dL — ABNORMAL HIGH (ref 0.50–1.10)
GFR calc Af Amer: 8 mL/min — ABNORMAL LOW (ref >90)
GFR, EST NON AFRICAN AMERICAN: 7 mL/min — AB (ref >90)
Glucose, Bld: 227 mg/dL — ABNORMAL HIGH (ref 70–99)
POTASSIUM: 5.2 meq/L (ref 3.7–5.3)
SODIUM: 140 meq/L (ref 137–147)

## 2014-04-23 LAB — CBC
HCT: 21.7 % — ABNORMAL LOW (ref 36.0–46.0)
HEMOGLOBIN: 7.1 g/dL — AB (ref 12.0–15.0)
MCH: 32.3 pg (ref 26.0–34.0)
MCHC: 32.7 g/dL (ref 30.0–36.0)
MCV: 98.6 fL (ref 78.0–100.0)
Platelets: 108 10*3/uL — ABNORMAL LOW (ref 150–400)
RBC: 2.2 MIL/uL — AB (ref 3.87–5.11)
RDW: 15.9 % — ABNORMAL HIGH (ref 11.5–15.5)
WBC: 21.7 10*3/uL — AB (ref 4.0–10.5)

## 2014-04-23 LAB — HEPARIN LEVEL (UNFRACTIONATED)
HEPARIN UNFRACTIONATED: 0.6 [IU]/mL (ref 0.30–0.70)
Heparin Unfractionated: 0.88 IU/mL — ABNORMAL HIGH (ref 0.30–0.70)

## 2014-04-23 LAB — URINE CULTURE
COLONY COUNT: NO GROWTH
CULTURE: NO GROWTH

## 2014-04-23 LAB — GLUCOSE, CAPILLARY: Glucose-Capillary: 181 mg/dL — ABNORMAL HIGH (ref 70–99)

## 2014-04-23 LAB — LIPID PANEL
CHOL/HDL RATIO: 2.5 ratio
Cholesterol: 147 mg/dL (ref 0–200)
HDL: 60 mg/dL (ref >39)
LDL CALC: 75 mg/dL (ref 0–99)
TRIGLYCERIDES: 59 mg/dL (ref <150)
VLDL: 12 mg/dL (ref 0–40)

## 2014-04-23 LAB — MRSA PCR SCREENING: MRSA by PCR: NEGATIVE

## 2014-04-23 LAB — PREPARE RBC (CROSSMATCH)

## 2014-04-23 LAB — LACTIC ACID, PLASMA
LACTIC ACID, VENOUS: 2.2 mmol/L (ref 0.5–2.2)
Lactic Acid, Venous: 2.5 mmol/L — ABNORMAL HIGH (ref 0.5–2.2)

## 2014-04-23 LAB — TROPONIN I
TROPONIN I: 5.75 ng/mL — AB (ref <0.30)
Troponin I: 5.59 ng/mL (ref <0.30)
Troponin I: 5.73 ng/mL (ref <0.30)

## 2014-04-23 LAB — PROCALCITONIN: Procalcitonin: 15.87 ng/mL

## 2014-04-23 MED ORDER — FENTANYL CITRATE 0.05 MG/ML IJ SOLN
25.0000 ug | INTRAMUSCULAR | Status: DC | PRN
  Administered 2014-04-24 – 2014-04-29 (×8): 25 ug via INTRAVENOUS
  Filled 2014-04-23 (×8): qty 2

## 2014-04-23 MED ORDER — NITROGLYCERIN IN D5W 200-5 MCG/ML-% IV SOLN
2.0000 ug/min | INTRAVENOUS | Status: DC
  Administered 2014-04-23: 15 ug/min via INTRAVENOUS
  Filled 2014-04-23: qty 250

## 2014-04-23 MED ORDER — PHENOL 1.4 % MT LIQD
1.0000 | OROMUCOSAL | Status: DC | PRN
  Filled 2014-04-23 (×2): qty 177

## 2014-04-23 MED FILL — Medication: Qty: 1 | Status: AC

## 2014-04-23 NOTE — Progress Notes (Signed)
[place] for heparin Indication: chest pain/ACS  Allergies  Allergen Reactions  . Fexofenadine Other (See Comments)    confusion  . Trazodone And Nefazodone     Lightheadedness  . Ciprofloxacin Other (See Comments)    REACTION: Headache and stomachache  . Diphenhydramine Hcl Other (See Comments)    REACTION: hyperactive  . Gabapentin Other (See Comments)    REACTION: leg pain  . Procaine Hcl     REACTION: Rash  . Propofol Diarrhea and Nausea And Vomiting    Had endoscopy at Dr Amedeo Plenty 10/2012  . Sulfamethoxazole-Trimethoprim     REACTION: Nausea  . Codeine Rash    REACTION: Rash  . Doxycycline Hyclate Rash    REACTION: rash?    Patient Measurements: Height: 4\' 11"  (149.9 cm) Weight: 135 lb 9.3 oz (61.5 kg) IBW/kg (Calculated) : 43.2 Heparin Dosing Weight: 58.3kg  Vital Signs: Temp: 98.4 F (36.9 C) (05/21 1135) Temp src: Oral (05/21 1135) BP: 75/40 mmHg (05/21 1135) Pulse Rate: 109 (05/21 1135)  Labs:  Recent Labs  04/22/14 1310  04/22/14 2213 04/23/14 0020 04/23/14 0228 04/23/14 0309 04/23/14 0650 04/23/14 1145  HGB 8.3*  --   --   --   --  7.1*  --   --   HCT 24.1*  --   --   --   --  21.7*  --   --   PLT 131*  --   --   --   --  108*  --   --   LABPROT  --   --  15.8*  --   --   --   --   --   INR  --   --  1.29  --   --   --   --   --   HEPARINUNFRC  --   --   --   --  0.88*  --   --  0.60  CREATININE 4.85*  --   --   --   --  4.93*  --   --   TROPONINI 1.74*  < > 4.64* 5.75* 5.59*  --  5.73*  --   < > = values in this interval not displayed.  Estimated Creatinine Clearance: 6.8 ml/min (by C-G formula based on Cr of 4.93).  Assessment: 80 yo female with NSTEMI for heparin. Heparin level is therapeutic at 0.60 IU/mL. CBC is titrating down.    Goal of Therapy:  Heparin level 0.3-0.7 units/ml Monitor platelets by anticoagulation protocol: Yes   Plan:  Continue heparin 600 units/hr Daily HL and CBC    Sloan Leiter, PharmD, BCPS Clinical Pharmacist [phone] 04/23/2014,1:06 PM

## 2014-04-23 NOTE — Significant Event (Signed)
Called to bedside for Code [facility] Patient unresponsive, given amp of bicarb by PCCM. Pt code status documented as DNR but son wanted medical personnel to intervene for life saving but not sustaining measures. Dr. Nelda Marseille, Dr. Gwenlyn Found and Dr. Jimmy Footman at bedside to discuss [place] with family. Decision made to not intubate and pt deemed not appropriate candidate for BiPAP. Son sat down with [place] for approx 20 minutes to discuss treatments going forward. Pt to be made comfort care. Stopping heparin drip. Will start with morphine prn. Cont abx and ativan as well as lasix and nitro drip.  Bernadene Bell, MD Family Medicine PGY-1 Please page or call with questions

## 2014-04-23 NOTE — Progress Notes (Signed)
Family Medicine Teaching Service Daily Progress Note Intern Pager: [phone]  Patient name: Belinda [name] Medical record number: [id number] Date of birth: [birth year] Age: 80 y.o. Gender: female  Primary Care Provider: Lupita Dawn, MD Consultants: Cardiology, Pulmonology/CCM, Nephrology Code Status: DNR/DNI  Pt Overview and Major Events to Date:  5/20: BiPAP, cefepime x 1, IV lasix 5/21: CTX+azithro  Assessment and Plan: Belinda [name] is a 80 y.o. female presenting with worsening SOB and acute onset chest pain. PMH is significant for CAD x 4 vessel stent, HTN, HLD, CKD V (does not want Dialysis), R AV fistula, Single Kidney, Diastolic CHF (grade II) EF 55-60% in 12/2013, Anemia of Renal Disease   # CV- Hx of CAD s/p 4 vessel stent/CHF.  Presentation consistent with NSTEMI and CHF exacerbation. Last echo 03/3153 grade ii diastolic with EF 00-86%. Risk strat labs: TSH (2.080), lipids (LDL 75). - appreciate cards recs: not currently cath candidate with present renal disease, 80mg  IV lasix q8hrs - last troponin trending up again 5.75>5.59>5.73; no plan to continue trending - trend i/os, daily weights - repeat EKG this AM appears largely unchanged (still has TWI v2-v3) - Heparin gtt (5/20>>) - Nitro gtt (5/20>>) - cont toprol xl 25mg  (home medication)  - ator 80, will give asa suppository now 300mg  then ASA 81mg     # Pulm- ARF requiring BiPAP. Respiratory alkalosis with uncompensated metabolic AG acidosis (2/2 lactic acidosis) Lactic acid 4>5.4>2.2>2.5. ABG with PH of 7.257, PCO2 24.9 and bicarb of 11.1  Ddx include fluid overload 2/2 CHF vs CAP; Treated for HCAP in the ED given prior hospitalization, however > 90 days ago. Will cont to treat for CAP only. WBC trended upward to 21 on most recent CBC. - PCCM consulted, appreciate recs - Cefipime (5/20 x1 in ED) - Rocephin & azithro (5/21>>) - gram stain, sputum cx  - f/up Bcx (obtained in ED prior to abx)  - legionella, strep pneumo  - status  overall improved, transitioned to [place] 6L this morning  # Renal- CKD stage 5 Baseline Cr 4-5; s/p nephrectomy several years ago, had a previous avf placed, not functioning currently, pt does not desire dialysis as an outpt; Vit D 10, likely 2/2 metabolic bone disease; s/p fluid bolus in the ED  - sees Dr. Joelyn Oms in outpt  - may need dialysis if pt is cath'd, though patient already declines dialysis as outpt - renal consulted, appreciate recs.   # Endo: DMII; last A1C 5.5  - currently diet controlled  - will check prn   # Heme: Anemia likely 2/2 renal disease, recent history of GI bleed in 12/2013. FOBT negative, but Hgb drop from 9.4 1 week ago to 8.3>7.1 - transfuse 1U PRBCs  FEN/GI: NPO while on BiPAP  Prophylaxis: heparin drip   Disposition: pending clinical improvement  Subjective:  Feels much better today, breathing improved. Complains of the bipap mask drying her mouth and throat out, blowing air into her eyes. Still has some midsternal chest pain today.   Son at bedside thinks she has gotten much better as well, fully aware of treatments she is receiving.  Objective: Temp:  [97.8 F (36.6 C)-99 F (37.2 C)] 99 F (37.2 C) (05/21 0741) Pulse Rate:  [99-133] 103 (05/21 0741) Resp:  [15-38] 18 (05/21 0741) BP: (98-163)/(35-84) 117/80 mmHg (05/21 0741) SpO2:  [89 %-100 %] 100 % (05/21 0741) FiO2 (%):  [50 %-100 %] 50 % (05/21 0741) Weight:  [135 lb 9.3 oz (61.5 kg)-136 lb (61.689  kg)] 135 lb 9.3 oz (61.5 kg) (05/21 0452) Physical Exam: General: NAD, able to converse despite having bipap mask on HEENT: PERRL, EOMI. Bipap mask in place Cardiovascular: tachycardic, no murmur appreciated though difficult to hear Respiratory: coarse rhonchi bilaterally, normal effort Abdomen: soft, nontender, nondistended, normal bowel sounds Extremities: trace edema bilaterally, WWP. Neuro: A+Ox4, CN grossly normal. Grip strength 5/5, toe extension 5/5.  Laboratory:  Recent Labs Lab  04/22/14 1310 04/23/14 0309  WBC 16.8* 21.7*  HGB 8.3* 7.1*  HCT 24.1* 21.7*  PLT 131* 108*    Recent Labs Lab 04/22/14 1310 04/23/14 0309  NA 143 140  K 4.9 5.2  CL 108 110  CO2 12* 11*  BUN 64* 70*  CREATININE 4.85* 4.93*  CALCIUM 9.6 8.9  PROT 6.2  --   BILITOT 0.7  --   ALKPHOS 212*  --   ALT 41*  --   AST 57*  --   GLUCOSE 176* 227*   Lactic acid: 4.1>5.4>2.2>2.5 Troponin 1.74>2.38>4.58>4.64>5.75>5.59>5.73  Imaging/Diagnostic Tests: Dg Chest 2 View  04/06/2014   CLINICAL DATA:  Dyspnea, cough, bronchitis  EXAM: CHEST  2 VIEW  COMPARISON:  12/17/2013  FINDINGS: Cardiomediastinal silhouette is stable. Mild elevation of the right hemidiaphragm again noted. No acute infiltrate or pleural effusion. No pulmonary edema. Degenerative changes right AC joint. Degenerative changes lower thoracic spine.  IMPRESSION: No active cardiopulmonary disease.   Electronically Signed   By: Lahoma Crocker M.D.   On: 04/06/2014 13:26   Dg Chest Port 1 View  04/23/2014   CLINICAL DATA:  Dyspnea and hypoxemia on BiPAP  EXAM: PORTABLE CHEST - 1 VIEW  COMPARISON:  DG CHEST 1V PORT dated 04/22/2014  FINDINGS: There is bilateral pulmonary hypoinflation. Increased density in the retrocardiac region on the left with new obscuration of the hemidiaphragm. Persistent perihilar pulmonary interstitial densities bilaterally The cardiopericardial silhouette remains enlarged.  IMPRESSION: Findings consistent with pulmonary interstitial edema likely of cardiac calls. A small left pleural effusion and left lower lobe atelectasis have developed.   Electronically Signed   By: David  Martinique   On: 04/23/2014 07:51   Dg Chest Port 1 View  04/22/2014   CLINICAL DATA:  Shortness of breath and weakness.  Chest pain.  EXAM: PORTABLE CHEST - 1 VIEW  COMPARISON:  04/06/2014  FINDINGS: Cardiac silhouette is mildly enlarged. The lungs are hypoinflated. There are new bilateral perihilar opacities, right greater than left. No definite  pleural effusion or pneumothorax is identified. No acute osseous abnormality is seen.  IMPRESSION: Bilateral perihilar opacities, which may reflect edema or possibly multifocal pneumonia.   Electronically Signed   By: Logan Bores   On: 04/22/2014 14:38    Tawanna Sat, MD 04/23/2014, 8:13 AM PGY-1, [place] Intern pager: [phone], text pages welcome

## 2014-04-23 NOTE — Progress Notes (Signed)
Family Practice Teaching Service Interval Progress Note  Stopped by pt's room to discuss comfort feeds with son. Pt appears well considering the events of the day. She is breathing relatively comfortably on a nonrebreather. She would very much like to eat something, even jello. Discussed risks of aspiration with pt and her son, given her current respiratory status. They would like to proceed with comfort feeds. Will order soft diet. Advised pt and son to use caution with feeds to prevent aspiration.  Chrisandra Netters, MD Family Medicine PGY-2 Service Pager [phone]

## 2014-04-23 NOTE — Progress Notes (Signed)
Placed pt on 6L [place] due to pt being more stable. Pt in no distress

## 2014-04-23 NOTE — Progress Notes (Signed)
Chaplain responded to code blue. Pt was revived on breathing. Chaplin provided prayer for family member. Family member denied needing anymore services. Chaplin recommends a follow up with family member.   Charyl Dancer, Chaplain  04/23/14 1300  Clinical Encounter Type  Visited With Patient and family together  Visit Type Code  Referral From Other (Comment)  Consult/Referral To Chaplain  Recommendations pt responsive refer for fu  Spiritual Encounters  Spiritual Needs Prayer  Stress Factors  Patient Stress Factors None identified  Family Stress Factors Health changes

## 2014-04-23 NOTE — Code Documentation (Signed)
  Patient Name: Belinda [name]   MRN: [id number]   Date of Birth/ Sex: [birth year] , female      Admission Date: 04/22/2014  Attending Provider: Willeen Niece, MD  Primary Diagnosis: Acute respiratory failure   Indication: Pt was in her usual state of health until this PM, when she was noted to be unresponsive. Code blue was subsequently called. At the time of arrival on scene, the patient had a pulse. She was receiving bag mask ventilation. She was DNR. However, her son who was present asked the team to perform life saving measures. The patient was given bicarb and she became more alert. She began breathing on her own  With an O2 sats int he 90's. She was transitioned to 15 L by non-rebreather. PCCM was bedside with nephrology and primary team.    Technical Description:  - CPR performance duration:  0  minutes  - Was defibrillation or cardioversion used? No   - Was external pacer placed? No  - Was patient intubated pre/post CPR? No   Medications Administered: Y = Yes; Blank = No Amiodarone    Atropine    Calcium    Epinephrine    Lidocaine    Magnesium    Norepinephrine    Phenylephrine    Sodium bicarbonate    Vasopressin     Post CPR evaluation:  - Final Status - Was patient successfully resuscitated ? Yes - What is current rhythm? Sinus tachycardia - What is current hemodynamic status? Stable  Miscellaneous Information:  - Labs sent, including: BMP, Mag, ABG,   - Primary team notified?  Yes  - Family Notified? Yes  - Additional notes/ transfer status: See Primary team's note for additional details.     Marrion Coy, MD  04/23/2014, 3:33 PM

## 2014-04-23 NOTE — Progress Notes (Signed)
Pt did not receive entire unit of PRBC per MD verbal order during code blue. Remaining of unit taken to blood bank. Pt IV flushed and saline locked.

## 2014-04-23 NOTE — Progress Notes (Signed)
Inpatient Diabetes Program Recommendations  AACE/ADA: New Consensus Statement on Inpatient Glycemic Control (2013)  Target Ranges:  Prepandial:   less than 140 mg/dL      Peak postprandial:   less than 180 mg/dL (1-2 hours)      Critically ill patients:  140 - 180 mg/dL     Results for Belinda [name], Belinda [name] (MRN [id number]) as of 04/23/2014 07:59  Ref. Range 04/23/2014 03:09  Glucose No range found 227 (H)     Patient admitted with CP/SOB.  History of DM, CAD, HTN, CKD 5, CHF.  Home DM Meds: None- Diet controlled  Currently receiving Prednisone 40 mg bid    MD- Please place order for CBG checks and cover with Novolog Sensitive SSI if elevated    Will follow Wyn Quaker RN, MSN, CDE Diabetes Coordinator Inpatient Diabetes Program Team Pager: [phone] (8a-10p)

## 2014-04-23 NOTE — Progress Notes (Signed)
Utilization review completed.  

## 2014-04-23 NOTE — Progress Notes (Signed)
FMTS Attending Note Patient seen and examined by me, discussed with resident team and I agree with Dr Marissa Calamity note for today.  I saw the patient initially this morning @0845 , at which time she was alert and responding appropriately to questions on BiPAP.  Called to her floor along with the resident team for code blue, patient reportedly was found unresponsive.  After conferring with patient's son [name] and CCM, it was conclusively determined that the patient is DNR/DNI and the code blue was stopped.  FM resident team and I conferred with [name] about goals of care, and the decision was made to transition patient to comfort care.  Patient has passed a bloody bowel movement, hence discontinuation of heparin gtt at this time. Son informed that we are requesting a Palliative Care consult as well. Dalbert Mayotte, MD

## 2014-04-23 NOTE — Progress Notes (Signed)
Patient Name: Belinda [name] Date of Encounter: 04/23/2014  Principal Problem:   Acute respiratory failure Active Problems:   DIABETES MELLITUS, II, COMPLICATIONS   Anemia in chronic kidney disease(285.21)   HYPERTENSION   ATHEROSCLEROTIC CARDIOVASCULAR DISEASE   CKD (chronic kidney disease) stage 5, GFR less than 15 ml/min   NSTEMI (non-ST elevated myocardial infarction)   Acute CHF   CAP (community acquired pneumonia)   Shock   Pulmonary edema    Patient Profile: Ms. Vialpando is an 80 yo female with PMH significant for CAD (s/p PCI w/DES in mid LAD, mid circ in 05/2007), CKD Stage 5 (still makes urine), DM, HLD, HTN, anemia of chronic disease, and grade II diastolic CHF (EF 10-17% 04/1024) who presented to the ED with ARF, NSTEMI, and CAP/CHF exacerbation.   SUBJECTIVE: She remains on 6L O2 and is still complaining of mild SOB while lying down. Denies palpitations and chest pain.   OBJECTIVE Filed Vitals:   04/23/14 1020 04/23/14 1035 04/23/14 1100 04/23/14 1135  BP: 120/66 101/69 103/68 75/40  Pulse: 107 110 109 109  Temp: 97.5 F (36.4 C) 98.1 F (36.7 C)  98.4 F (36.9 C)  TempSrc: Oral Oral  Oral  Resp: 26 24 21 26   Height:      Weight:      SpO2: 95% 95% 94% 96%    Intake/Output Summary (Last 24 hours) at 04/23/14 1227 Last data filed at 04/23/14 0800  Gross per 24 hour  Intake     48 ml  Output    350 ml  Net   -302 ml   Filed Weights   04/22/14 1229 04/23/14 0452  Weight: 136 lb (61.689 kg) 135 lb 9.3 oz (61.5 kg)    PHYSICAL EXAM General: Well developed, well nourished, female in no acute distress. Head: Normocephalic, atraumatic.  Neck: Supple without bruits, no JVD. Lungs:  Resp regular and mildly labored, diffuse wheezes, bibasilar rales Heart: RRR, S1, S2, no S3, S4, or murmur; no rub. Abdomen: Soft, non-tender, non-distended, BS + x 4.  Extremities: No clubbing, cyanosis, edema.  Neuro: Alert and oriented X 3. Moves all extremities  spontaneously. Psych: Normal affect.  LABS: CBC: Recent Labs  04/22/14 1310 04/23/14 0309  WBC 16.8* 21.7*  NEUTROABS 15.9*  --   HGB 8.3* 7.1*  HCT 24.1* 21.7*  MCV 98.0 98.6  PLT 131* 108*   INR: Recent Labs  04/22/14 2213  INR 8.52   Basic Metabolic Panel: Recent Labs  04/22/14 1310 04/23/14 0309  NA 143 140  K 4.9 5.2  CL 108 110  CO2 12* 11*  GLUCOSE 176* 227*  BUN 64* 70*  CREATININE 4.85* 4.93*  CALCIUM 9.6 8.9   Liver Function Tests: Recent Labs  04/22/14 1310  AST 57*  ALT 41*  ALKPHOS 212*  BILITOT 0.7  PROT 6.2  ALBUMIN 3.0*   Cardiac Enzymes: Recent Labs  04/23/14 0020 04/23/14 0228 04/23/14 0650  TROPONINI 5.75* 5.59* 5.73*   BNP: Pro B Natriuretic peptide (BNP)  Date/Time Value Ref Range Status  04/22/2014  1:10 PM 35772.0* 0 - 450 pg/mL Final  12/17/2013  2:05 AM 7879.0* 0 - 450 pg/mL Final   Fasting Lipid Panel: Recent Labs  04/23/14 0309  CHOL 147  HDL 60  LDLCALC 75  TRIG 59  CHOLHDL 2.5   Thyroid Function Tests: Recent Labs  04/22/14 2213  TSH 2.080    TELE:       NSR, HR 90s  ECG:  04/23/14 Sinus tachycardia RBBB  Radiology/Studies: Dg Chest Port 1 View  04/23/2014   CLINICAL DATA:  Dyspnea and hypoxemia on BiPAP  EXAM: PORTABLE CHEST - 1 VIEW  COMPARISON:  DG CHEST 1V PORT dated 04/22/2014  FINDINGS: There is bilateral pulmonary hypoinflation. Increased density in the retrocardiac region on the left with new obscuration of the hemidiaphragm. Persistent perihilar pulmonary interstitial densities bilaterally The cardiopericardial silhouette remains enlarged.  IMPRESSION: Findings consistent with pulmonary interstitial edema likely of cardiac calls. A small left pleural effusion and left lower lobe atelectasis have developed.   Electronically Signed   By: David  Martinique   On: 04/23/2014 07:51   Dg Chest Port 1 View  04/22/2014   CLINICAL DATA:  Shortness of breath and weakness.  Chest pain.  EXAM: PORTABLE CHEST -  1 VIEW  COMPARISON:  04/06/2014  FINDINGS: Cardiac silhouette is mildly enlarged. The lungs are hypoinflated. There are new bilateral perihilar opacities, right greater than left. No definite pleural effusion or pneumothorax is identified. No acute osseous abnormality is seen.  IMPRESSION: Bilateral perihilar opacities, which may reflect edema or possibly multifocal pneumonia.   Electronically Signed   By: Logan Bores   On: 04/22/2014 14:38     Current Medications:  . aspirin  324 mg Oral NOW   Or  . aspirin  300 mg Rectal NOW  . aspirin EC  81 mg Oral Daily  . atorvastatin  80 mg Oral q1800  . azithromycin  500 mg Intravenous Q24H  . cefTRIAXone (ROCEPHIN)  IV  1 g Intravenous Q24H  . furosemide  80 mg Intravenous Q8H  . hydrALAZINE  12.5 mg Oral 3 times per day  . metoprolol succinate  25 mg Oral Daily  . predniSONE  40 mg Oral BID WC  . sodium bicarbonate  650 mg Oral BID  . sodium chloride  3 mL Intravenous Q12H   . heparin 600 Units/hr (04/23/14 0800)  . nitroGLYCERIN 15 mcg/min (04/23/14 0800)    ASSESSMENT AND PLAN: Principal Problem:   Acute respiratory failure Active Problems:   DIABETES MELLITUS, II, COMPLICATIONS   Anemia in chronic kidney disease(285.21)   HYPERTENSION   ATHEROSCLEROTIC CARDIOVASCULAR DISEASE   CKD (chronic kidney disease) stage 5, GFR less than 15 ml/min   NSTEMI (non-ST elevated myocardial infarction)   Acute CHF   CAP (community acquired pneumonia)   Shock   Pulmonary edema  At the end of the interview, Ms. Silverio became suddenly unresponsive. On telemetry, she became bradycardic into the 40s. Code blue was called. Her code status was initially DNR, but her son was present and initially wanted everything done at first. She was successfully resuscitated with BVM and 1 amp of bicarb. She did not require CPR or ETT. CCM and the code team responded.   Once she was resuscitated, the situation was discussed with her son. It was decided she should be  comfort care and a full NCB.  Cardiology will sign off, available as needed.   Rondel Baton, PA-S  Signed, Seen and agree. Lonn Georgia , PA-C 12:27 PM 04/23/2014   Agree with note by Rosaria Ferries PA-C  Pt had a respiratory arrest while being examined. She ultimately was made a DNR. She had supportive measures but the combination of her severe comorbidities precludes a meaningful recovery. Dr. Chuck Hint explained this to the Son and I agree. Will S/O  Lorretta Harp, M.D., FACP, [facility], [place] [place] [facility] [address]  [place], [place]  [id number]  [phone] 04/24/2014 7:09 AM

## 2014-04-23 NOTE — Progress Notes (Signed)
Pt transported to 3S01 on 4L [place] vitals stable throughout. Bipap placed in room on standby. RT will continue to monitor.

## 2014-04-23 NOTE — Progress Notes (Addendum)
Family Practice Teaching Service Interval Progress Note  S: Checked on patient (@ 108), currently remaining in ED awaiting SDU bed placement. Discussed with patient's nurse regarding updates, Foley inplace with good UOP, vitals with increasing BP 140s/100s, persistent mild tachycardia 100s, overall stable with maintaining POx >97% on BiPAP (tolerating well), patient mostly asleep but able to be aroused and with appropriate responses. Reported that she feels better and breathing better on mask, denies c/o CP. Patient's son at bedside.  O: BP 158/84  Pulse 108  Resp 22  Ht 4\' 11"  (1.499 m)  Wt 136 lb (61.689 kg)  BMI 27.45 kg/m2  SpO2 97% Gen - sleeping, but arousable to verbal stimuli, on BiPAP, verbal responses over BiPAP, NAD Heart - tachycardic, distant heart sounds Lungs - Persistent diffuse coarse rhonchi bilaterally, without focal finding, resolved tachypnea Abd - soft, NTND, +hypoactive BS Ext - stable trace +1 edema b/l, intact pulses Neuro - awakens to verbal stimuli, drowsy, oriented to person and place, grossly intact   Intake/Output Summary (Last 24 hours) at 04/23/14 0403 Last data filed at 04/23/14 0346  Gross per 24 hour  Intake      0 ml  Output    350 ml  Net   -350 ml   Per nursing, about 700cc urine collected in Foley  A&P: Briefly, 80 yr female with NSTEMI, ARF on BiPAP, acute on chronic diastolic CHF exac, suspected CAP. Significant PMH with CKD-Stage V (not candidate for cardiac cath), DM, HTN, GERD, Anemia, hx prior GI bleed. - currently stable on BiPAP - to be transferred to [place] unit, SDU bed recently available - continue Heparin gtt, per pharm. Note continued increasing trend Troponin up to 5.75, now decreasing to 5.59 - s/p Nitro drip - Continue IV antibiotics for suspected CAP, Azithro, Ceftriaxone, Cefepime x 1 - consulted CCM - to continue to follow, trend LA, Trop, PCT, rec TTE, CXR - consulted Renal, to see in AM - consulted Cardiology -  continue Lasix 80 mg IV q 8 hr, with good UOP, concern with advanced renal disease, not candidate for cath - Anemia with acute Hgb drop 8.3 to 7.1, plan to closely monitor with caution of hx GI bleed - POCT FOBT (negative) performed at bedside, unlikely GI bleed - Proceed with transfusion 1u PRBC given ACS concern for further demand ischemia  - See prior H&P for complete plan  Nobie Putnam, DO Family Medicine PGY-1 Service Pager [phone]

## 2014-04-23 NOTE — ED Notes (Signed)
Updated Dr. Marlyne Beards on patients condition. No new orders

## 2014-04-23 NOTE — Progress Notes (Signed)
Pt found unresponsive on bedpan. Code blue initiated. Pt had pulse. Noted respiratory distress, initially placed on non rebreather at 15L. Appropriate staff at bedside. Son at bedside. Pt DNR via chart but code status reversed per son and Dr. Gwenlyn Found; then received bag mask ventilation. See code notes for details. Pt became more alert and placed back on non rebreather. Resting comfortably with family at bedside. Will continue to monitor.

## 2014-04-23 NOTE — Progress Notes (Signed)
[place] for heparin Indication: chest pain/ACS  Allergies  Allergen Reactions  . Fexofenadine Other (See Comments)    confusion  . Trazodone And Nefazodone     Lightheadedness  . Ciprofloxacin Other (See Comments)    REACTION: Headache and stomachache  . Diphenhydramine Hcl Other (See Comments)    REACTION: hyperactive  . Gabapentin Other (See Comments)    REACTION: leg pain  . Procaine Hcl     REACTION: Rash  . Propofol Diarrhea and Nausea And Vomiting    Had endoscopy at Dr Amedeo Plenty 10/2012  . Sulfamethoxazole-Trimethoprim     REACTION: Nausea  . Codeine Rash    REACTION: Rash  . Doxycycline Hyclate Rash    REACTION: rash?    Patient Measurements: Height: 4\' 11"  (149.9 cm) Weight: 136 lb (61.689 kg) IBW/kg (Calculated) : 43.2 Heparin Dosing Weight: 58.3kg  Vital Signs: BP: 136/67 mmHg (05/21 0200) Pulse Rate: 102 (05/21 0200)  Labs:  Recent Labs  04/22/14 1310  04/22/14 1839 04/22/14 2213 04/23/14 0020 04/23/14 0228 04/23/14 0309  HGB 8.3*  --   --   --   --   --  7.1*  HCT 24.1*  --   --   --   --   --  21.7*  PLT 131*  --   --   --   --   --  PENDING  LABPROT  --   --   --  15.8*  --   --   --   INR  --   --   --  1.29  --   --   --   HEPARINUNFRC  --   --   --   --   --  0.88*  --   CREATININE 4.85*  --   --   --   --   --   --   TROPONINI 1.74*  < > 4.58* 4.64* 5.75*  --   --   < > = values in this interval not displayed.  Estimated Creatinine Clearance: 6.9 ml/min (by C-G formula based on Cr of 4.85).  Assessment: 80 yo female with NSTEMI for heparin   Goal of Therapy:  Heparin level 0.3-0.7 units/ml Monitor platelets by anticoagulation protocol: Yes   Plan:  Decrease heparin 600 units/hr Check heparin level in 8 hours.    Bronson Curb Filippo Puls 04/23/2014,3:34 AM

## 2014-04-23 NOTE — Progress Notes (Signed)
Called by bedside RN, patient was found unresponsive and in respiratory failure on a bedpan.  Was given an amp of bicarb during a code blue incident and regained consciousness.  Patient was originally DNR but son reversed it.  Patient is in renal failure, hear failure and respiratory failure.  Cardiology, renal and family practice were all bedside.  After conferencing with all specialties full support for this patient would be very ill advised.  I spoke with the patient's son, informed him that all involved specialties strongly recommend against full code status.  After discussion, decision was made that patient will not survive this and to proceed with comfort care.  She is clearly not able to protect her airway so BiPAP is contraindicated and that was relayed to the primary team.  Recommend full comfort care at this point.  Son is in agreement.  Made DNR and will defer to primary as to when to start morphine.  PCCM will sign off, please call back if needed.  CC time 35 min.  Rush Farmer, M.D. [facility] Pulmonary/Critical Care Medicine. Pager: [phone]. After hours pager: [phone].

## 2014-04-24 DIAGNOSIS — Z515 Encounter for palliative care: Secondary | ICD-10-CM

## 2014-04-24 DIAGNOSIS — J811 Chronic pulmonary edema: Secondary | ICD-10-CM

## 2014-04-24 DIAGNOSIS — R630 Anorexia: Secondary | ICD-10-CM

## 2014-04-24 LAB — TYPE AND SCREEN
ABO/RH(D): O POS
Antibody Screen: NEGATIVE
UNIT DIVISION: 0

## 2014-04-24 MED ORDER — SORBITOL 70 % SOLN
30.0000 mL | Freq: Every day | Status: DC | PRN
  Filled 2014-04-24: qty 30

## 2014-04-24 MED ORDER — ENSURE PUDDING PO PUDG: 1.0000 | ORAL | Status: DC | PRN

## 2014-04-24 MED ORDER — HYDROCORTISONE 2.5 % RE CREA
TOPICAL_CREAM | Freq: Two times a day (BID) | RECTAL | Status: DC
  Administered 2014-04-24 (×2): via RECTAL
  Filled 2014-04-24: qty 28.35

## 2014-04-24 MED ORDER — METOPROLOL TARTRATE 12.5 MG HALF TABLET
12.5000 mg | ORAL_TABLET | Freq: Two times a day (BID) | ORAL | Status: DC
  Administered 2014-04-24 – 2014-04-28 (×8): 12.5 mg via ORAL
  Filled 2014-04-24 (×14): qty 1

## 2014-04-24 MED ORDER — ENSURE COMPLETE PO LIQD: 237.0000 mL | ORAL | Status: DC | PRN

## 2014-04-24 NOTE — Progress Notes (Signed)
FMTS Attending Note Patient seen and examined by me, discussed with resident team and I agree with Dr Marissa Calamity note for today.  Patient is alert and appears comfortable, asking for glass of water during my visit.  Denies chest pain or dyspnea. Appreciate Palliative Care consult and assistance, see yesterday's note regarding lengthy discussion held between our [facility] team and the patient's son [name], reaffirming the DNR/DNI status and goal of comfort care.  Dalbert Mayotte, MD

## 2014-04-24 NOTE — Progress Notes (Signed)
Nutrition Brief Note  RD consulted by palliative care NP, Vinie Sill, regarding patient's poor PO intake. Pt is now on comfort feedings with no plans for enteral nutrition.  Discussed nutrition with family at bedside. They report that patient's oral intake is minimal, but they aren't surprised. They would like Ensure Complete and Ensure Pudding ordered as needed. Discussed with current RN, [place].  Body mass index is 27.41 kg/(m^2). Patient meets criteria for overweight based on current BMI.   Current diet order is Soft Diet, patient is consuming approximately <50% of meals at this time. Labs and medications reviewed.   No nutrition interventions warranted at this time. If nutrition issues arise, please consult RD.   Inda Coke MS, RD, LDN Inpatient Registered Dietitian Pager: [phone] After-hours pager: [phone]

## 2014-04-24 NOTE — Progress Notes (Signed)
Family Medicine Teaching Service Daily Progress Note Intern Pager: [phone]  Patient name: Belinda [name] Medical record number: [id number] Date of birth: [birth year] Age: 80 y.o. Gender: female  Primary Care Provider: Lupita Dawn, MD Consultants: Cardiology, Pulmonology/CCM, Nephrology Code Status: DNR/DNI  Pt Overview and Major Events to Date:  5/20: BiPAP, cefepime x 1, IV lasix 5/21: CTX+azithro. Code blue with resusc, now comfort care  Assessment and Plan: Belinda [name] is a 80 y.o. female presenting with worsening SOB and acute onset chest pain. PMH is significant for CAD x 4 vessel stent, HTN, HLD, CKD V (does not want Dialysis), R AV fistula, Single Kidney, Diastolic CHF (grade II) EF 55-60% in 12/2013, Anemia of Renal Disease   # Pulm- ARF on admission requiring BiPAP. Transitioned to [place] yesterday morning and coded around noon with resus. After discussion with family it was decided to move toward comfort care - continue Rocephin & azithro (5/21>>) - oxygen by mask/[place]; do not use bipap and do not intubate - palliative care has been consulted, appreciate recs  # CV- Hx of CAD s/p 4 vessel stent/CHF.  Presentation consistent with NSTEMI and CHF exacerbation. Last echo 07/7563 grade ii diastolic with EF 33-29%. Risk strat labs: TSH (2.080), lipids (LDL 75). - stop heparin drip   # Renal- CKD stage 5 Baseline Cr 4-5; s/p nephrectomy several years ago, had a previous avf placed, not functioning currently, pt does not desire dialysis as an outpt; Vit D 10, likely 2/2 metabolic bone disease; s/p fluid bolus in the ED  - oral bicarb  # Heme: Anemia likely 2/2 renal disease, recent history of GI bleed in 12/2013. FOBT negative, but Hgb drop from 9.4 1 week ago to 8.3>7.1 - transfused 1U PRBCs (stopped short due to code blue)  FEN/GI: comfort feeds  Prophylaxis: heparin drip stopped  Disposition: pending goals of care, tenuous state  Subjective:  Appears improved overall, stable and off  oxygen this morning. Complains primarily of fatigue and then falls asleep.  Objective: Temp:  [97.1 F (36.2 C)-99 F (37.2 C)] 97.6 F (36.4 C) (05/22 0725) Pulse Rate:  [72-110] 72 (05/22 0725) Resp:  [16-28] 21 (05/22 0725) BP: (75-140)/(40-98) 140/98 mmHg (05/22 0725) SpO2:  [94 %-100 %] 100 % (05/22 0725) FiO2 (%):  [50 %] 50 % (05/21 0900) Weight:  [135 lb 12.9 oz (61.6 kg)] 135 lb 12.9 oz (61.6 kg) (05/22 0340) Physical Exam: General: NAD, oxygen off HEENT: PERRL, EOMI. Cardiovascular: tachycardic, no murmur appreciated though difficult to hear Respiratory: coarse rhonchi bilaterally, normal effort Abdomen: soft, nontender, nondistended, normal bowel sounds Extremities: trace edema bilaterally, WWP. Neuro: sleeping  Laboratory:  Recent Labs Lab 04/22/14 1310 04/23/14 0309  WBC 16.8* 21.7*  HGB 8.3* 7.1*  HCT 24.1* 21.7*  PLT 131* 108*    Recent Labs Lab 04/22/14 1310 04/23/14 0309  NA 143 140  K 4.9 5.2  CL 108 110  CO2 12* 11*  BUN 64* 70*  CREATININE 4.85* 4.93*  CALCIUM 9.6 8.9  PROT 6.2  --   BILITOT 0.7  --   ALKPHOS 212*  --   ALT 41*  --   AST 57*  --   GLUCOSE 176* 227*   Lactic acid: 4.1>5.4>2.2>2.5 Troponin 1.74>2.38>4.58>4.64>5.75>5.59>5.73  Imaging/Diagnostic Tests: Dg Chest 2 View  04/06/2014   CLINICAL DATA:  Dyspnea, cough, bronchitis  EXAM: CHEST  2 VIEW  COMPARISON:  12/17/2013  FINDINGS: Cardiomediastinal silhouette is stable. Mild elevation of the right hemidiaphragm again noted. No  acute infiltrate or pleural effusion. No pulmonary edema. Degenerative changes right AC joint. Degenerative changes lower thoracic spine.  IMPRESSION: No active cardiopulmonary disease.   Electronically Signed   By: Lahoma Crocker M.D.   On: 04/06/2014 13:26   Dg Chest Port 1 View  04/23/2014   CLINICAL DATA:  Dyspnea and hypoxemia on BiPAP  EXAM: PORTABLE CHEST - 1 VIEW  COMPARISON:  DG CHEST 1V PORT dated 04/22/2014  FINDINGS: There is bilateral pulmonary  hypoinflation. Increased density in the retrocardiac region on the left with new obscuration of the hemidiaphragm. Persistent perihilar pulmonary interstitial densities bilaterally The cardiopericardial silhouette remains enlarged.  IMPRESSION: Findings consistent with pulmonary interstitial edema likely of cardiac calls. A small left pleural effusion and left lower lobe atelectasis have developed.   Electronically Signed   By: David  Martinique   On: 04/23/2014 07:51   Dg Chest Port 1 View  04/22/2014   CLINICAL DATA:  Shortness of breath and weakness.  Chest pain.  EXAM: PORTABLE CHEST - 1 VIEW  COMPARISON:  04/06/2014  FINDINGS: Cardiac silhouette is mildly enlarged. The lungs are hypoinflated. There are new bilateral perihilar opacities, right greater than left. No definite pleural effusion or pneumothorax is identified. No acute osseous abnormality is seen.  IMPRESSION: Bilateral perihilar opacities, which may reflect edema or possibly multifocal pneumonia.   Electronically Signed   By: Logan Bores   On: 04/22/2014 14:38    Tawanna Sat, MD 04/24/2014, 7:32 AM PGY-1, [place] Intern pager: [phone], text pages welcome

## 2014-04-24 NOTE — Discharge Summary (Signed)
[facility] Discharge Summary  Patient name: Belinda [name] Medical record number: [id number] Date of birth: 1927-05-09 Age: 80 y.o. Gender: female Date of Admission: 04/22/2014  Date of Discharge: 04/25/2014 Admitting Physician: Willeen Niece, MD  Primary Care Provider: Lupita Dawn, MD Consultants: Cardiology, Pulmonology, Nephrology, Palliative medicine  Indication for Hospitalization: Shortness of breath  Discharge Diagnoses/Problem List:  NSTEMI Acute respiratory failure secondary to suspected CAP CAD x 4 vessel stents Diastolic CHF (grade 2), EF 55-60% HTN HLD CKD Stage V (declines dialysis) Single kidney Anemia of renal disease  Disposition: GIP, inpatient hospice  Discharge Condition: terminally ill  Brief Hospital Course:  Belinda [name] is a 80 y.o. female was admitted for shortness of breath with acute respiratory failure and subsequently found to have NSTEMI. PMH significant for 4 vessel CAD s/p stents, diastolic CHF, HTN, HLD, CKD Stage 5 with single kidney, anemia of chronic disease.   # Pulmonary: on admission requiring BiPAP. Had respiratory alkalosis with uncompensated metabolic AG acidosis (ABG 7.257/24.9/69.0/11.1), lactic acid trended up to 5.4 and down to 2.2. Started on treatment for CAP coverage with ceftriaxone and azithromycin in addition to diuresis for possible CHF exacerbation. Pulmonary CCM was consulted, and discussion with family at that time confirmed DNR/DNI status. The following morning patient was improved and weaned to 6L [place]. Around noon patient subsequently went into respiratory failure and code blue was called. Son was present and requested all interventions be given; she was bag mask ventilated, received bicarb and became more alert/breathing on her own. She had no further episodes of respiratory distress during hospital course and remained on [place].  # CV: on admission with NSTEMI and CHF exacerbation (ProBNP 35,772).  Cardiology was consulted. Started on heparin gtt. Troponin trended up to 5.75 before code blue occurred and decision for move toward comfort care. Patient was continued on IV lasix for comfort measures but heparin gtt discontinued.  # Renal: CKD stage 5 with baseline Cr 4-5, s/p nephrectomy. Pt had previously declined dialysis. Renal was consulted to assist with diuresis but signed off after change toward comfort measures. She was continued on home bicarb  # Heme: anemia secondary to renal disease, recent GI bleed in January 2015. FOBT was negative on admission but had a hemoglobin drop from 9.4 the week prior to admission to 8.3>7.1. She was in the process of receiving 1 unit PRBCs when code blue occurred. No additional monitoring of hemoglobin was performed.   # Comfort care: after code blue discussion with family and consultants determined comfort care was the direction her care moved to. Palliative care team was consulted and on board. Long discussions with son who has had a difficult time with the situation. Patient was on fentanyl injection scheduled q8hrs and PRN but pain was not managed, switched to fentanyl patch and roxanol PRN. Pain regimen adjusted on day of discharge to inpatient hospice by palliative team to scheduled roxanol q4hrs in addition to PRN, increased lorazepam PRN frequency. Patient discharged to GIP.  Issues for Follow Up:  1. End of life care and pain management  Significant Procedures: heparin for ACS  Significant Labs and Imaging:  Results for Belinda [name] (MRN [id number]) as of 04/13/2014 11:27  Ref. Range 04/22/2014 13:10 04/23/2014 03:09  Sodium Latest Range: 137-147 mmol/L 143 140  Potassium Latest Range: 3.7-5.3 mEq/L 4.9 5.2  Chloride Latest Range: 96-112 mEq/L 108 110  CO2 Latest Range: 19-32 mEq/L 12 (L) 11 (L)  BUN Latest Range: 4-21  mg/dL 64 (H) 70 (H)  Creatinine Latest Range: 0.50-1.10 mg/dL 4.85 (H) 4.93 (H)  Calcium Latest Range: 8.4-10.5 mg/dL 9.6 8.9   GFR calc non Af Amer Latest Range: >90 mL/min 7 (L) 7 (L)  GFR calc Af Amer Latest Range: >90 mL/min 9 (L) 8 (L)  Glucose No range found 176 (H) 227 (H)  Alkaline Phosphatase Latest Range: 39-117 U/L 212 (H)   Albumin Latest Range: 3.5-5.2 g/dL 3.0 (L)   AST Latest Range: 0-37 U/L 57 (H)   ALT Latest Range: 0-35 U/L 41 (H)   Total Protein Latest Range: 6.0-8.3 g/dL 6.2   Total Bilirubin Latest Range: 0.3-1.2 mg/dL 0.7    Results for Belinda [name] (MRN [id number]) as of 04/21/2014 11:27  Ref. Range 04/22/2014 13:10 04/23/2014 03:09  WBC Latest Range: 4.0-10.5 K/uL 16.8 (H) 21.7 (H)  RBC Latest Range: 3.87-5.11 MIL/uL 2.46 (L) 2.20 (L)  Hemoglobin Latest Range: 12.0-15.0 g/dL 8.3 (L) 7.1 (L)  HCT Latest Range: 36.0-46.0 % 24.1 (L) 21.7 (L)  MCV Latest Range: 78.0-100.0 fL 98.0 98.6  MCH Latest Range: 26.0-34.0 pg 33.7 32.3  MCHC Latest Range: 30.0-36.0 g/dL 34.4 32.7  RDW Latest Range: 11.5-15.5 % 16.1 (H) 15.9 (H)  Platelets Latest Range: 150-400 K/uL 131 (L) 108 (L)   Results for Belinda [name] (MRN [id number]) as of 05/01/2014 11:27  Ref. Range 04/22/2014 13:10 04/22/2014 14:40 04/22/2014 18:39 04/22/2014 22:13 04/23/2014 00:20 04/23/2014 02:28 04/23/2014 06:50  Troponin I Latest Range: <0.30 ng/mL 1.74 (HH) 2.38 (HH) 4.58 (HH) 4.64 (HH) 5.75 (HH) 5.59 (HH) 5.73 (HH)  Pro B Natriuretic peptide (BNP) Latest Range: 0-450 pg/mL 35772.0 (H)         TSH 2.080 HIV nonreactive Strep pneumo urine ag negative Lactic acid: 4.1>5.4>2.2>2.5  Dg Chest Port 1 View  04/23/2014   CLINICAL DATA:  Dyspnea and hypoxemia on BiPAP  EXAM: PORTABLE CHEST - 1 VIEW  COMPARISON:  DG CHEST 1V PORT dated 04/22/2014  FINDINGS: There is bilateral pulmonary hypoinflation. Increased density in the retrocardiac region on the left with new obscuration of the hemidiaphragm. Persistent perihilar pulmonary interstitial densities bilaterally The cardiopericardial silhouette remains enlarged.  IMPRESSION: Findings consistent with  pulmonary interstitial edema likely of cardiac calls. A small left pleural effusion and left lower lobe atelectasis have developed.   Electronically Signed   By: David  Martinique   On: 04/23/2014 07:51   Dg Chest Port 1 View  04/22/2014   CLINICAL DATA:  Shortness of breath and weakness.  Chest pain.  EXAM: PORTABLE CHEST - 1 VIEW  COMPARISON:  04/06/2014  FINDINGS: Cardiac silhouette is mildly enlarged. The lungs are hypoinflated. There are new bilateral perihilar opacities, right greater than left. No definite pleural effusion or pneumothorax is identified. No acute osseous abnormality is seen.  IMPRESSION: Bilateral perihilar opacities, which may reflect edema or possibly multifocal pneumonia.   Electronically Signed   By: Logan Bores   On: 04/22/2014 14:38    Results/Tests Pending at Time of Discharge: none  Discharge Medications:    Medication List    STOP taking these medications       acetaminophen 650 MG CR tablet  Commonly known as:  TYLENOL     albuterol 108 (90 BASE) MCG/ACT inhaler  Commonly known as:  PROVENTIL HFA;VENTOLIN HFA     allopurinol 100 MG tablet  Commonly known as:  ZYLOPRIM     aspirin 81 MG tablet     busPIRone 15 MG tablet  Commonly known as:  BUSPAR     CARBOXYMETHYLCELLULOSE SODIUM OP     CHLORASEPTIC MAX SORE THROAT 1.5-33 % Liqd  Generic drug:  Phenol-Glycerin     fluticasone 50 MCG/ACT nasal spray  Commonly known as:  FLONASE     furosemide 20 MG tablet  Commonly known as:  LASIX     glucose blood test strip     hydrocortisone 2.5 % rectal cream  Commonly known as:  PROCTOSOL HC     isosorbide mononitrate 30 MG 24 hr tablet  Commonly known as:  IMDUR     loperamide 2 MG capsule  Commonly known as:  IMODIUM     metoprolol succinate 50 MG 24 hr tablet  Commonly known as:  TOPROL-XL     NIFEdipine 30 MG 24 hr tablet  Commonly known as:  PROCARDIA-XL/ADALAT CC     nitroGLYCERIN 0.4 MG SL tablet  Commonly known as:  NITROSTAT      omeprazole 20 MG capsule  Commonly known as:  PRILOSEC     polyethylene glycol packet  Commonly known as:  MIRALAX / GLYCOLAX     predniSONE 20 MG tablet  Commonly known as:  DELTASONE     pyridOXINE 100 MG tablet  Commonly known as:  VITAMIN B-6     simethicone 80 MG chewable tablet  Commonly known as:  MYLICON     simvastatin 20 MG tablet  Commonly known as:  ZOCOR     sodium bicarbonate 650 MG tablet     traMADol 50 MG tablet  Commonly known as:  ULTRAM     Vitamin D (Ergocalciferol) 50000 UNITS Caps capsule  Commonly known as:  DRISDOL        Tawanna Sat, MD 04/08/2014, 11:38 AM PGY-1, [place]

## 2014-04-24 NOTE — Progress Notes (Signed)
Full note to follow:  I met today with Ms. Sutherland son, [name], and his girlfriend, Danise Mina. [name] is very tearful throughout the conversation and understands after yesterday how seriously ill his mother is. He confirms DNR, no dialysis, no feeding tube. They are considering home with hospice and comfort measures with no return to hospital. Hard Choices booklet and MOST explained and given to be completed in future. I will continue to follow and support.  Vinie Sill, NP Palliative Medicine Team Pager # [phone] (M-F 8a-5p) Team Phone # [phone] (Nights/Weekends)

## 2014-04-24 NOTE — Progress Notes (Signed)
Chaplain responded to referral from RN. Patient was present with granddaughter and sister. Chaplain provided emotional support and prayer. Family and pt discussing whether to proceed with arrangements for after pt's death or to wait and focus on present. Sister asked that chaplain pray "for God's will." Chaplain available for follow-up as necessary.

## 2014-04-24 NOTE — Consult Note (Signed)
Patient ID:Belinda [name]      DOB: [birth year]      EXB:[id number]     Consult Note from the Palliative Medicine Team at [facility] Requested by: Dr. Ardelia Mems     PCP: Lupita Dawn, MD Reason for Consultation: Miranda and options.    Phone [phone]  Assessment of patients Current state: Belinda [name] is a 80 yo female admitted with chest pain and shortness of breath. PMH significant for CAD, CKD stage V, diastolic CHF (last EF 10-27%), anemia, HTN.   I met today with Belinda [name] son, [name] [phone]), and his girlfriend, Danise Mina [phone]). [name] is very tearful throughout the conversation and understands after yesterday how seriously ill his mother is. He has spoken with numerous providers yesterday concerning his mother's care plan. We discussed a priority of comfort. They tell me that she was getting weaker at home and eating very little of her meals (sometimes only a few bites a day). He confirms that she has made her wishes known for DNR, no dialysis, no feeding tube. [name] says that she is going to go home and will not go to a "nursing home." We discussed expectations are that she will continue to weaken, especially with decreased nutritional intake and will require more and more assistance. We discussed the reality of caring for her at home with the anticipation that she will need 24/7 care and will have needs related to end of life. I recommend that they consider hospice if they choose to go home. I am not sure that she is eligible for a hospice facility, but may be if she declines further. They are considering home with hospice and comfort measures with no return to hospital. Hard Choices booklet and MOST form explained and given to be completed in future. I will continue to follow and support.    Goals of Care: 1.  Code Status: DNR   2. Scope of Treatment: 1. Vital Signs: routine  2. Respiratory/Oxygen: yes 3. Nutritional Support/Tube Feeds: no 4. Antibiotics:  consider as needed 5. IVF: consider as trial period 6. Labs: yes 7. Telemetry: yes   4. Disposition: To be determined on outcomes.    3. Symptom Management:   1. Anxiety/Agitation: Lorazepam prn.  2. Pain: Fentanyl prn. Family concern that she will not admit pain. Please assess for nonverbal cues. 3. Bowel Regimen: Sorbitol prn.   4. Psychosocial: Emotional support provided to patient and family.     Patient Documents Completed or Given: Document Given Completed  Advanced Directives Pkt    MOST yes   DNR    Gone from My Sight    Hard Choices yes     Brief HPI: 80 yo with CKD stage V not desiring dialysis with multiple complicating comorbitities.    ROS: Denies pain, nausea, constipation.     PMH:  Past Medical History  Diagnosis Date  . Stenosis, spinal, lumbar 05/20/2006    L 3-4, 4-5  MRI  . Bilateral sensorineural hearing loss 10/2006    audiogram  . Diabetes mellitus   . Chronic kidney disease   . Diverticulosis of colon   . H/O endoscopy 11/2006    benign duodenal scar  . Normal cardiac stress test 01/30/2006    Cardiolite EF 54%  . Abnormal barium swallow 12/2009    ?thyroid nodule  . Complication of anesthesia     ' I cant take propaphol "  . Hypertension   . Shortness of breath   .  Asthma   . GERD (gastroesophageal reflux disease)   . Arthritis   . PONV (postoperative nausea and vomiting)   . Diverticulosis      PSH: Past Surgical History  Procedure Laterality Date  . Combined hysterectomy abdominal w/ a&p repair / oophorectomy  1960's    for endometriosis  . Cholecystectomy open  2002  . Basal cell carcinoma excision  06/2003    gluteal fold  . Retinal laser procedure  10/2003    left eye  . Nephrectomy  12/2003    left due to non-function  . Minor breast biopsy  06/2010    right   . Esophageal dilation  10/2012    [place] [facility]  . Abdominal hysterectomy    . Esophagogastroduodenoscopy N/A 12/18/2013    Procedure:  ESOPHAGOGASTRODUODENOSCOPY (EGD);  Surgeon: Missy Sabins, MD;  Location: [facility] ENDOSCOPY;  Service: Endoscopy;  Laterality: N/A;  . Colonoscopy N/A 12/19/2013    Procedure: COLONOSCOPY;  Surgeon: Missy Sabins, MD;  Location: [facility];  Service: Endoscopy;  Laterality: N/A;   I have reviewed the [place] and SH and  If appropriate update it with new information. Allergies  Allergen Reactions  . Fexofenadine Other (See Comments)    confusion  . Trazodone And Nefazodone     Lightheadedness  . Ciprofloxacin Other (See Comments)    REACTION: Headache and stomachache  . Diphenhydramine Hcl Other (See Comments)    REACTION: hyperactive  . Gabapentin Other (See Comments)    REACTION: leg pain  . Procaine Hcl     REACTION: Rash  . Propofol Diarrhea and Nausea And Vomiting    Had endoscopy at Dr Amedeo Plenty 10/2012  . Sulfamethoxazole-Trimethoprim     REACTION: Nausea  . Codeine Rash    REACTION: Rash  . Doxycycline Hyclate Rash    REACTION: rash?   Scheduled Meds: . azithromycin  500 mg Intravenous Q24H  . cefTRIAXone (ROCEPHIN)  IV  1 g Intravenous Q24H  . furosemide  80 mg Intravenous Q8H  . metoprolol tartrate  12.5 mg Oral BID  . sodium bicarbonate  650 mg Oral BID  . sodium chloride  3 mL Intravenous Q12H   Continuous Infusions:  PRN Meds:.sodium chloride, albuterol, fentaNYL, LORazepam, phenol, sodium chloride    BP 139/83  Pulse 87  Temp(Src) 97.6 F (36.4 C) (Oral)  Resp 24  Ht _0  (1.499 m)  Wt 61.6 kg (135 lb 12.9 oz)  BMI 27.41 kg/m2  SpO2 92%   PPS: 30%   Intake/Output Summary (Last 24 hours) at 04/24/14 1033 Last data filed at 04/24/14 0800  Gross per 24 hour  Intake 1190.75 ml  Output    550 ml  Net 640.75 ml   LBM: 04/22/14                         Physical Exam:  General: NAD, sleeping, pale HEENT: [place]/AT, no JVD, moist mucous membranes Chest: Coarse throughout, no labored breathing, symmetric, room air CVS: Tachycardia, S1 S2 Abdomen: Soft, NT,  ND Ext: BLE trace edema, warm to touch Neuro: Sleeping (family says when awake she converses appropriately)  Labs: CBC    Component Value Date/Time   WBC 21.7* 04/23/2014 0309   RBC 2.20* 04/23/2014 0309   RBC 2.62* 12/20/2013 0425   HGB 7.1* 04/23/2014 0309   HGB 9.4* 04/13/2014 1203   HCT 21.7* 04/23/2014 0309   PLT 108* 04/23/2014 0309   MCV 98.6 04/23/2014 0309  MCH 32.3 04/23/2014 0309   MCHC 32.7 04/23/2014 0309   RDW 15.9* 04/23/2014 0309   LYMPHSABS 0.2* 04/22/2014 1310   MONOABS 0.6 04/22/2014 1310   EOSABS 0.0 04/22/2014 1310   BASOSABS 0.0 04/22/2014 1310    BMET    Component Value Date/Time   NA 140 04/23/2014 0309   NA 140 03/11/2012   K 5.2 04/23/2014 0309   CL 110 04/23/2014 0309   CO2 11* 04/23/2014 0309   GLUCOSE 227* 04/23/2014 0309   BUN 70* 04/23/2014 0309   BUN 77* 03/11/2012   CREATININE 4.93* 04/23/2014 0309   CREATININE 3.89* 01/23/2014 1352   CREATININE 3.9* 03/11/2012   CALCIUM 8.9 04/23/2014 0309   CALCIUM 9.6 12/25/2011 1245   GFRNONAA 7* 04/23/2014 0309   GFRAA 8* 04/23/2014 0309    CMP     Component Value Date/Time   NA 140 04/23/2014 0309   NA 140 03/11/2012   K 5.2 04/23/2014 0309   CL 110 04/23/2014 0309   CO2 11* 04/23/2014 0309   GLUCOSE 227* 04/23/2014 0309   BUN 70* 04/23/2014 0309   BUN 77* 03/11/2012   CREATININE 4.93* 04/23/2014 0309   CREATININE 3.89* 01/23/2014 1352   CREATININE 3.9* 03/11/2012   CALCIUM 8.9 04/23/2014 0309   CALCIUM 9.6 12/25/2011 1245   PROT 6.2 04/22/2014 1310   ALBUMIN 3.0* 04/22/2014 1310   AST 57* 04/22/2014 1310   ALT 41* 04/22/2014 1310   ALKPHOS 212* 04/22/2014 1310   BILITOT 0.7 04/22/2014 1310   GFRNONAA 7* 04/23/2014 0309   GFRAA 8* 04/23/2014 0309     Time In Time Out Total Time Spent with Patient Total Overall Time  0915 1045 3mn 955m    Greater than 50%  of this time was spent counseling and coordinating care related to the above assessment and plan.  AlVinie SillNP Palliative Medicine Team Pager # [phone](M-F 8a-5p) Team Phone # [phone]/Weekends)  Discussed with Dr. HeAwanda Mink

## 2014-04-25 MED ORDER — WHITE PETROLATUM GEL
Status: AC
  Filled 2014-04-25: qty 5

## 2014-04-25 MED ORDER — HALOPERIDOL 1 MG PO TABS
1.0000 mg | ORAL_TABLET | Freq: Three times a day (TID) | ORAL | Status: DC | PRN
  Filled 2014-04-25: qty 1

## 2014-04-25 MED ORDER — LORAZEPAM 2 MG/ML IJ SOLN: 0.5000 mg | Freq: Two times a day (BID) | INTRAMUSCULAR | Status: DC | PRN

## 2014-04-25 MED ORDER — ONDANSETRON HCL 4 MG/2ML IJ SOLN
4.0000 mg | Freq: Four times a day (QID) | INTRAMUSCULAR | Status: DC | PRN
  Administered 2014-04-25: 4 mg via INTRAVENOUS
  Filled 2014-04-25: qty 2

## 2014-04-25 NOTE — Progress Notes (Signed)
Patient seen and examined by me today, I discussed with Dr Parks Ranger and agree with his note .Please see my separate note for additional details.  Belinda Mayotte, MD

## 2014-04-25 NOTE — Progress Notes (Signed)
FMTS Attending Note Patient seen and examined by me today, long discussion with patient's son [name] and fiance. [name] is clear about his wishes for goals of care to be comfort, "that's what she wanted".  Concerns raised about pain, appears to be in pain about every 12 hours, not to rely on prn orders because she is stoic and does not ask for things.  Also, has been dealing with sleeplessness and agitation at night at home before this admission, would like this addressed before discharge. Family aware that patient will be transferred from SDU floor today, they are arranging their schedules and shifts to be able to give 24 hour supervision to patient when she arrives at home.  Plan for home hospice at discharge.  Dalbert Mayotte, MD

## 2014-04-25 NOTE — Progress Notes (Signed)
Family Medicine Teaching Service Daily Progress Note Intern Pager: [phone]  Patient name: Belinda [name] Medical record number: [id number] Date of birth: September 25, 1927 Age: 80 y.o. Gender: female  Primary Care Provider: Lupita Dawn, MD Consultants: Cardiology, Pulmonology/CCM, Nephrology Code Status: DNR/DNI  Pt Overview and Major Events to Date:  5/20: BiPAP, cefepime x 1, IV lasix 5/21: CTX+azithro. Code blue with resusc, now comfort care 5/22: Palliative Care 5/23: Transfer out of SDU to floor, continue comfort care, plan to meet with Palliative 04/27/14  Assessment and Plan: Belinda [name] is a 80 y.o. female presenting with worsening SOB and acute onset chest pain. PMH is significant for CAD x 4 vessel stent, HTN, HLD, CKD V (does not want Dialysis), R AV fistula, Single Kidney, Diastolic CHF (grade II) EF 55-60% in 12/2013, Anemia of Renal Disease   # Pulm - ARF on admission requiring BiPAP. Transitioned to [place] yesterday morning and coded around noon with resus. After discussion with family it was decided to move toward comfort care - Transfer from SDU to regular floor today - continue Rocephin & azithro (5/21>>) - oxygen by mask/[place]; do not use bipap and do not intubate - palliative care has been consulted, appreciate recs. See note dated 04/24/14 for details. Start comfort care measures with symptomatic control, per family anticipate to meet with Palliative again on Monday 5/25 to further discuss plan for discharge arrangements, anticipate home hospice - Decreased frequency of Ativan 0.5-1mg  from q 4 to q 12, given concern for how BDZ affecting patient - Start Haldol 1mg  PO TID PRN anxiety, reviewed QTc 484 on last EKG - Fentanyl 45mcg q 30 min PRN pain (use non-verbal cues) - Sorbitol PRN constipation - Zofran PRN nausea  # CV - Hx of CAD s/p 4 vessel stent/CHF.  Presentation consistent with NSTEMI and CHF exacerbation. Last echo 05/3015 grade ii diastolic with EF 01-09%. Risk strat  labs: TSH (2.080), lipids (LDL 75). - No longer on Heparin gtt - Continue Lasix 80mg  q 8 hrs - Foley cath in place, decreased UOP  # Renal - CKD stage 5 Baseline Cr 4-5; s/p nephrectomy several years ago, had a previous avf placed, not functioning currently, pt does not desire dialysis as an outpt; Vit D 10, likely 2/2 metabolic bone disease; s/p fluid bolus in the ED  - oral bicarb  # Heme - Anemia likely 2/2 renal disease, recent history of GI bleed in 12/2013. FOBT negative, but Hgb drop from 9.4 1 week ago to 8.3>7.1 - s/p transfused 1U PRBCs (5/22) (stopped short due to code blue)  FEN/GI: comfort feeds  Prophylaxis: heparin drip stopped  Disposition: pending goals of care, tenuous state  Subjective:  Resting in bed, disoriented but denies any acute complaints. Improved nausea following Zofran. Remains on O2 via [place].  Objective: Temp:  [97.3 F (36.3 C)-98.2 F (36.8 C)] 97.3 F (36.3 C) (05/23 0308) Pulse Rate:  [68-87] 86 (05/23 0308) Resp:  [15-26] 16 (05/23 0308) BP: (115-141)/(20-83) 130/49 mmHg (05/23 0308) SpO2:  [92 %-100 %] 100 % (05/23 0308) Weight:  [141 lb 12.1 oz (64.3 kg)] 141 lb 12.1 oz (64.3 kg) (05/23 0308) Physical Exam: General: resting, mild discomfort, [place] in place, NAD HEENT: PERRL, mild dry MM Cardiovascular: RRR, no murmur appreciated Respiratory: persistent coarse rhonchi bilaterally, normal effort Abdomen: soft, nontender, nondistended, normal bowel sounds Extremities: trace edema bilaterally, WWP. Neuro: awake, alert, oriented to person (not place / time), grossly non-focal  Laboratory:  Recent Labs Lab 04/22/14 1310  04/23/14 0309  WBC 16.8* 21.7*  HGB 8.3* 7.1*  HCT 24.1* 21.7*  PLT 131* 108*    Recent Labs Lab 04/22/14 1310 04/23/14 0309  NA 143 140  K 4.9 5.2  CL 108 110  CO2 12* 11*  BUN 64* 70*  CREATININE 4.85* 4.93*  CALCIUM 9.6 8.9  PROT 6.2  --   BILITOT 0.7  --   ALKPHOS 212*  --   ALT 41*  --   AST 57*  --    GLUCOSE 176* 227*   Lactic acid: 4.1>5.4>2.2>2.5 Troponin 1.74>2.38>4.58>4.64>5.75>5.59>5.73  Imaging/Diagnostic Tests: Dg Chest 2 View  04/06/2014   CLINICAL DATA:  Dyspnea, cough, bronchitis  EXAM: CHEST  2 VIEW  COMPARISON:  12/17/2013  FINDINGS: Cardiomediastinal silhouette is stable. Mild elevation of the right hemidiaphragm again noted. No acute infiltrate or pleural effusion. No pulmonary edema. Degenerative changes right AC joint. Degenerative changes lower thoracic spine.  IMPRESSION: No active cardiopulmonary disease.   Electronically Signed   By: Lahoma Crocker M.D.   On: 04/06/2014 13:26   Dg Chest Port 1 View  04/23/2014   CLINICAL DATA:  Dyspnea and hypoxemia on BiPAP  EXAM: PORTABLE CHEST - 1 VIEW  COMPARISON:  DG CHEST 1V PORT dated 04/22/2014  FINDINGS: There is bilateral pulmonary hypoinflation. Increased density in the retrocardiac region on the left with new obscuration of the hemidiaphragm. Persistent perihilar pulmonary interstitial densities bilaterally The cardiopericardial silhouette remains enlarged.  IMPRESSION: Findings consistent with pulmonary interstitial edema likely of cardiac calls. A small left pleural effusion and left lower lobe atelectasis have developed.   Electronically Signed   By: David  Martinique   On: 04/23/2014 07:51   Dg Chest Port 1 View  04/22/2014   CLINICAL DATA:  Shortness of breath and weakness.  Chest pain.  EXAM: PORTABLE CHEST - 1 VIEW  COMPARISON:  04/06/2014  FINDINGS: Cardiac silhouette is mildly enlarged. The lungs are hypoinflated. There are new bilateral perihilar opacities, right greater than left. No definite pleural effusion or pneumothorax is identified. No acute osseous abnormality is seen.  IMPRESSION: Bilateral perihilar opacities, which may reflect edema or possibly multifocal pneumonia.   Electronically Signed   By: Logan Bores   On: 04/22/2014 14:38    Nobie Putnam, DO 04/25/2014, 7:34 AM PGY-1, [place] Intern pager: [phone], text pages welcome

## 2014-04-26 NOTE — Progress Notes (Signed)
FMTS Attending Note Patient seen and examined by me today, discussed with resident team and I agree with Dr Marissa Calamity note for today.  Patient's granddaughter, [name], is at bedside.  Patient is awake and able to answer questions with one-word answers. Denies pain or discomfort. She has stopped eating, but accepts small amounts of water from a straw.  Appreciate Palliative Service's help in defining GOC and putting in place plan for home hospice care.  Dalbert Mayotte, MD

## 2014-04-26 NOTE — Progress Notes (Signed)
Family Medicine Teaching Service Daily Progress Note Intern Pager: [phone]  Patient name: Belinda [name] Medical record number: [id number] Date of birth: 1927-04-25 Age: 80 y.o. Gender: female  Primary Care Provider: Lupita Dawn, MD Consultants: Cardiology, Pulmonology/CCM, Nephrology Code Status: DNR/DNI  Pt Overview and Major Events to Date:  5/20: BiPAP, cefepime x 1, IV lasix 5/21: CTX+azithro. Code blue with resusc, now comfort care 5/22: Palliative Care 5/23: Transfer out of SDU to floor, continue comfort care, plan to meet with Palliative 04/27/14  Assessment and Plan: Belinda [name] is a 80 y.o. female presenting with worsening SOB and acute onset chest pain. PMH is significant for CAD x 4 vessel stent, HTN, HLD, CKD V (does not want Dialysis), R AV fistula, Single Kidney, Diastolic CHF (grade II) EF 55-60% in 12/2013, Anemia of Renal Disease   # Pulm - ARF on admission requiring BiPAP. Transitioned to [place] yesterday morning and coded around noon with resus. After discussion with family it was decided to move toward comfort care - continue Rocephin & azithro (5/21>>) - oxygen by mask/[place]; do not use bipap and do not intubate - palliative care has been consulted, appreciate recs. See note dated 04/24/14 for details. Start comfort care measures with symptomatic control, per family anticipate to meet with Palliative again on Monday 5/25 to further discuss plan for discharge arrangements, anticipate home hospice - Decreased frequency of Ativan 0.5-1mg  from q 4 to q 12, given concern for how BDZ affecting patient - Start Haldol 1mg  PO TID PRN anxiety, QTc 484 on last EKG - Fentanyl 57mcg q 30 min PRN pain (use non-verbal cues) - Sorbitol PRN constipation - Zofran PRN nausea  # CV - Hx of CAD s/p 4 vessel stent/CHF.  Presentation consistent with NSTEMI and CHF exacerbation. Last echo 0/1027 grade ii diastolic with EF 25-36%. Risk strat labs: TSH (2.080), lipids (LDL 75). - No longer on  Heparin gtt - Continue Lasix 80mg  q 8 hrs - Foley cath in place, decreased UOP  # Renal - CKD stage 5 Baseline Cr 4-5; s/p nephrectomy several years ago, had a previous avf placed, not functioning currently, pt does not desire dialysis as an outpt; Vit D 10, likely 2/2 metabolic bone disease; s/p fluid bolus in the ED  - oral bicarb  # Heme - Anemia likely 2/2 renal disease, recent history of GI bleed in 12/2013. FOBT negative, but Hgb drop from 9.4 1 week ago to 8.3>7.1 - s/p transfused 1U PRBCs (5/22) (stopped short due to code blue)  FEN/GI: comfort feeds  Prophylaxis: heparin drip stopped  Disposition: pending goals of care, tenuous state  Subjective:  Sleeping comfortably but easily awoken. Mumbles/groans answers to questions; does not complain of pain currently. Has not eaten anything yesterday, had only a little to drink.  Objective: Temp:  [97.1 F (36.2 C)-97.9 F (36.6 C)] 97.5 F (36.4 C) (05/24 0529) Pulse Rate:  [65-82] 65 (05/24 0529) Resp:  [20-31] 20 (05/24 0529) BP: (98-142)/(44-125) 134/69 mmHg (05/24 0529) SpO2:  [99 %-100 %] 100 % (05/24 0529) Weight:  [128 lb 9.6 oz (58.333 kg)] 128 lb 9.6 oz (58.333 kg) (05/23 1639) Physical Exam: General: resting,NAD. HEENT: PERRL, EOMI.  Cardiovascular: RRR, no murmur appreciated Respiratory: persistent coarse rhonchi bilaterally, normal effort Abdomen: soft, nontender, nondistended, normal bowel sounds Extremities: trace edema bilaterally, WWP. Skin: diffuse ecchymosis on both dorsal aspects hand/wrists Neuro: awake, alert, oriented to person (not place / time), grossly non-focal  Laboratory:  Recent Labs Lab 04/22/14 1310  04/23/14 0309  WBC 16.8* 21.7*  HGB 8.3* 7.1*  HCT 24.1* 21.7*  PLT 131* 108*    Recent Labs Lab 04/22/14 1310 04/23/14 0309  NA 143 140  K 4.9 5.2  CL 108 110  CO2 12* 11*  BUN 64* 70*  CREATININE 4.85* 4.93*  CALCIUM 9.6 8.9  PROT 6.2  --   BILITOT 0.7  --   ALKPHOS 212*  --    ALT 41*  --   AST 57*  --   GLUCOSE 176* 227*   Lactic acid: 4.1>5.4>2.2>2.5 Troponin 1.74>2.38>4.58>4.64>5.75>5.59>5.73  Imaging/Diagnostic Tests: Dg Chest 2 View  04/06/2014   CLINICAL DATA:  Dyspnea, cough, bronchitis  EXAM: CHEST  2 VIEW  COMPARISON:  12/17/2013  FINDINGS: Cardiomediastinal silhouette is stable. Mild elevation of the right hemidiaphragm again noted. No acute infiltrate or pleural effusion. No pulmonary edema. Degenerative changes right AC joint. Degenerative changes lower thoracic spine.  IMPRESSION: No active cardiopulmonary disease.   Electronically Signed   By: Lahoma Crocker M.D.   On: 04/06/2014 13:26   Dg Chest Port 1 View  04/23/2014   CLINICAL DATA:  Dyspnea and hypoxemia on BiPAP  EXAM: PORTABLE CHEST - 1 VIEW  COMPARISON:  DG CHEST 1V PORT dated 04/22/2014  FINDINGS: There is bilateral pulmonary hypoinflation. Increased density in the retrocardiac region on the left with new obscuration of the hemidiaphragm. Persistent perihilar pulmonary interstitial densities bilaterally The cardiopericardial silhouette remains enlarged.  IMPRESSION: Findings consistent with pulmonary interstitial edema likely of cardiac calls. A small left pleural effusion and left lower lobe atelectasis have developed.   Electronically Signed   By: David  Martinique   On: 04/23/2014 07:51   Dg Chest Port 1 View  04/22/2014   CLINICAL DATA:  Shortness of breath and weakness.  Chest pain.  EXAM: PORTABLE CHEST - 1 VIEW  COMPARISON:  04/06/2014  FINDINGS: Cardiac silhouette is mildly enlarged. The lungs are hypoinflated. There are new bilateral perihilar opacities, right greater than left. No definite pleural effusion or pneumothorax is identified. No acute osseous abnormality is seen.  IMPRESSION: Bilateral perihilar opacities, which may reflect edema or possibly multifocal pneumonia.   Electronically Signed   By: Logan Bores   On: 04/22/2014 14:38    Tawanna Sat, MD 04/26/2014, 7:03 AM PGY-1, [place] Intern pager: [phone], text pages welcome

## 2014-04-27 MED ORDER — FENTANYL CITRATE 0.05 MG/ML IJ SOLN
25.0000 ug | Freq: Three times a day (TID) | INTRAMUSCULAR | Status: DC
  Administered 2014-04-27 – 2014-04-29 (×5): 25 ug via INTRAVENOUS
  Filled 2014-04-27 (×5): qty 2

## 2014-04-27 NOTE — Progress Notes (Signed)
FMTS Attending Note Patient seen and examined by me today, discussed with resident team and I agree with Dr Marissa Calamity note for today. Patient is alert, asking for water. Reports some mild shortness of breath. Has improved with nebulizer treatments for this overnight, according to granddaughter [name].  Plan for arrangements for home hospice, appreciate help of the Palliative Care service.  Dalbert Mayotte, MD

## 2014-04-27 NOTE — Progress Notes (Addendum)
Progress Note from the Palliative Medicine Team at [facility]: I spoke today with son [name], his girlfriend Danise Mina, and his daughter [name]. They tell me that she has declined further over the weekend and is not eating and rarely speaking - mostly nonverbal. She is sleeping this morning when I enter the room. Family is very emotional and we discussed [name] talking to her and telling her that he will be okay and giving her permission to go. Family says that she has expressed numerous times lately that she is ready to die but that she is worried about [name]. [name] is struggling with the logistics of taking her home. He is extremely emotional and I am not sure that home will be a realistic option for them. I believe the family is overwhelmed and it will be difficult. They would like to reassess tomorrow for home with hospice. May have to consider other options such as hospice facility. I will follow up tomorrow. They also asked me to make note in her chart that if they have chosen [facility].     Objective: Allergies  Allergen Reactions  . Fexofenadine Other (See Comments)    confusion  . Trazodone And Nefazodone     Lightheadedness  . Ciprofloxacin Other (See Comments)    REACTION: Headache and stomachache  . Diphenhydramine Hcl Other (See Comments)    REACTION: hyperactive  . Gabapentin Other (See Comments)    REACTION: leg pain  . Procaine Hcl     REACTION: Rash  . Propofol Diarrhea and Nausea And Vomiting    Had endoscopy at Dr Amedeo Plenty 10/2012  . Sulfamethoxazole-Trimethoprim     REACTION: Nausea  . Codeine Rash    REACTION: Rash  . Doxycycline Hyclate Rash    REACTION: rash?   Scheduled Meds: . azithromycin  500 mg Intravenous Q24H  . fentaNYL  25 mcg Intravenous Q8H  . furosemide  80 mg Intravenous Q8H  . hydrocortisone   Rectal BID  . metoprolol tartrate  12.5 mg Oral BID  . sodium bicarbonate  650 mg Oral BID  . sodium chloride  3 mL  Intravenous Q12H   Continuous Infusions:  PRN Meds:.sodium chloride, albuterol, feeding supplement (ENSURE COMPLETE), feeding supplement (ENSURE), fentaNYL, haloperidol, LORazepam, ondansetron (ZOFRAN) IV, phenol, sodium chloride, sorbitol  BP 98/79  Pulse 81  Temp(Src) 97.5 F (36.4 C) (Axillary)  Resp 19  Ht 4\' 11"  (1.499 m)  Wt 58.333 kg (128 lb 9.6 oz)  BMI 25.96 kg/m2  SpO2 100%   PPS: 20%     Intake/Output Summary (Last 24 hours) at 04/27/14 1618 Last data filed at 04/27/14 1244  Gross per 24 hour  Intake      0 ml  Output   1400 ml  Net  -1400 ml      LBM: 04/25/14      Physical Exam:  General: NAD, sleeping, pale  HEENT: [place]/AT, no JVD, moist mucous membranes  Chest: No labored breathing, symmetric, oxygen CVS: RRR, S1 S2  Abdomen: Soft, NT, ND  Ext: BLE trace edema, warm to touch  Neuro: Sleeping comfortable (verbalizes with much effort according to family)    Labs: CBC    Component Value Date/Time   WBC 21.7* 04/23/2014 0309   RBC 2.20* 04/23/2014 0309   RBC 2.62* 12/20/2013 0425   HGB 7.1* 04/23/2014 0309   HGB 9.4* 04/13/2014 1203   HCT 21.7* 04/23/2014 0309   PLT 108* 04/23/2014 0309   MCV 98.6 04/23/2014 0309  MCH 32.3 04/23/2014 0309   MCHC 32.7 04/23/2014 0309   RDW 15.9* 04/23/2014 0309   LYMPHSABS 0.2* 04/22/2014 1310   MONOABS 0.6 04/22/2014 1310   EOSABS 0.0 04/22/2014 1310   BASOSABS 0.0 04/22/2014 1310    BMET    Component Value Date/Time   NA 140 04/23/2014 0309   NA 140 03/11/2012   K 5.2 04/23/2014 0309   CL 110 04/23/2014 0309   CO2 11* 04/23/2014 0309   GLUCOSE 227* 04/23/2014 0309   BUN 70* 04/23/2014 0309   BUN 77* 03/11/2012   CREATININE 4.93* 04/23/2014 0309   CREATININE 3.89* 01/23/2014 1352   CREATININE 3.9* 03/11/2012   CALCIUM 8.9 04/23/2014 0309   CALCIUM 9.6 12/25/2011 1245   GFRNONAA 7* 04/23/2014 0309   GFRAA 8* 04/23/2014 0309    CMP     Component Value Date/Time   NA 140 04/23/2014 0309   NA 140 03/11/2012   K 5.2 04/23/2014 0309    CL 110 04/23/2014 0309   CO2 11* 04/23/2014 0309   GLUCOSE 227* 04/23/2014 0309   BUN 70* 04/23/2014 0309   BUN 77* 03/11/2012   CREATININE 4.93* 04/23/2014 0309   CREATININE 3.89* 01/23/2014 1352   CREATININE 3.9* 03/11/2012   CALCIUM 8.9 04/23/2014 0309   CALCIUM 9.6 12/25/2011 1245   PROT 6.2 04/22/2014 1310   ALBUMIN 3.0* 04/22/2014 1310   AST 57* 04/22/2014 1310   ALT 41* 04/22/2014 1310   ALKPHOS 212* 04/22/2014 1310   BILITOT 0.7 04/22/2014 1310   GFRNONAA 7* 04/23/2014 0309   GFRAA 8* 04/23/2014 0309     Assessment and Plan: 1. Code Status: DNR 2. Symptom Control: 1. Pain: Fentanyl 25 mcg every 8 hours scheduled. Fentanyl every 30 min prn.  2. Anxiety: Haldol prn. Ativan prn.  3. Nausea: Ondansetron prn.  4. Bowel Regimen: Sorbitol prn.  3. Psycho/Social: Emotional support provided to family during difficult time.  4. Disposition: To be determined on outcomes.     Time In Time Out Total Time Spent with Patient Total Overall Time  1345 1430 19min 67min    Greater than 50%  of this time was spent counseling and coordinating care related to the above assessment and plan.  Vinie Sill, NP Palliative Medicine Team Pager # [phone] (M-F 8a-5p) Team Phone # [phone] (Nights/Weekends)   1

## 2014-04-27 NOTE — Progress Notes (Signed)
Family Medicine Teaching Service Daily Progress Note Intern Pager: [phone]  Patient name: Belinda [name] Medical record number: [id number] Date of birth: 08/24/1927 Age: 80 y.o. Gender: female  Primary Care Provider: Lupita Dawn, MD Consultants: Cardiology, Pulmonology/CCM, Nephrology Code Status: DNR/DNI  Pt Overview and Major Events to Date:  5/20: BiPAP, cefepime x 1, IV lasix 5/21: CTX+azithro. Code blue with resusc, now comfort care 5/22: Palliative Care consult 5/23: Transfer out of SDU to floor, continue comfort care, plan to meet with Palliative  Assessment and Plan: Belinda [name] is a 80 y.o. female presenting with worsening SOB and acute onset chest pain. PMH is significant for CAD x 4 vessel stent, HTN, HLD, CKD V (does not want Dialysis), R AV fistula, Single Kidney, Diastolic CHF (grade II) EF 55-60% in 12/2013, Anemia of Renal Disease   # Pulm - ARF on admission requiring BiPAP. Transitioned to [place] yesterday morning and coded around noon with resus. After discussion with family it was decided to move toward comfort care - continue azithro (5/21>>). Discontinue rocephin (5/21>>5/24) - oxygen by mask/[place]; do not use bipap and do not intubate - palliative care has been consulted, appreciate recs. See note dated 04/24/14 for details. Start comfort care measures with symptomatic control, per family anticipate to meet with Palliative again on Monday 5/25 to further discuss plan for discharge arrangements, anticipate home hospice - Ativan 0.5-1mg  q12, given concern for how BDZ affecting patient - Haldol 1mg  PO TID PRN anxiety - Fentanyl 32mcg q 30 min PRN pain (use non-verbal cues) - Sorbitol PRN constipation - Zofran PRN nausea  # CV - Hx of CAD s/p 4 vessel stent/CHF.  Presentation consistent with NSTEMI and CHF exacerbation. Last echo 12/7508 grade ii diastolic with EF 25-85%. Risk strat labs: TSH (2.080), lipids (LDL 75). - No longer on Heparin gtt - Continue Lasix 80mg  q 8  hrs - Foley cath in place, decreased UOP  # Renal - CKD stage 5 Baseline Cr 4-5; s/p nephrectomy several years ago, had a previous avf placed, not functioning currently, pt does not desire dialysis as an outpt; Vit D 10, likely 2/2 metabolic bone disease; s/p fluid bolus in the ED  - oral bicarb still ordered (last bicarb 11) - pending discussion of palliative care, still no blood draws.  # Heme - Anemia likely 2/2 renal disease, recent history of GI bleed in 12/2013. FOBT negative, but Hgb drop from 9.4 1 week ago to 8.3>7.1 - s/p transfused 1U PRBCs (5/22) (stopped short due to code blue)  FEN/GI: comfort feeds  Prophylaxis: heparin drip stopped  Disposition: pending goals of care  Subjective:  Sleeping comfortably, does not awaken to voice/exam. Granddaughter at bedside says she is the same as yesterday, still not eating/drinking much.  Objective: Temp:  [97.5 F (36.4 C)] 97.5 F (36.4 C) (05/25 0518) Pulse Rate:  [81] 81 (05/25 0518) Resp:  [19] 19 (05/25 0518) BP: (98)/(79) 98/79 mmHg (05/25 0518) SpO2:  [94 %] 94 % (05/25 0518) Physical Exam: General: resting,NAD. HEENT: PERRL, EOMI.  Cardiovascular: RRR, no murmur appreciated Respiratory: persistent coarse rhonchi bilaterally, normal effort Abdomen: soft, nontender, nondistended, normal bowel sounds Extremities: trace edema bilaterally, WWP. Skin: diffuse ecchymosis on both dorsal aspects hand/wrists Neuro: awake, alert, oriented to person (not place / time), grossly non-focal  Laboratory:  Recent Labs Lab 04/22/14 1310 04/23/14 0309  WBC 16.8* 21.7*  HGB 8.3* 7.1*  HCT 24.1* 21.7*  PLT 131* 108*    Recent Labs Lab 04/22/14  1310 04/23/14 0309  NA 143 140  K 4.9 5.2  CL 108 110  CO2 12* 11*  BUN 64* 70*  CREATININE 4.85* 4.93*  CALCIUM 9.6 8.9  PROT 6.2  --   BILITOT 0.7  --   ALKPHOS 212*  --   ALT 41*  --   AST 57*  --   GLUCOSE 176* 227*   Lactic acid: 4.1>5.4>2.2>2.5 Troponin  1.74>2.38>4.58>4.64>5.75>5.59>5.73  Imaging/Diagnostic Tests: Dg Chest 2 View  04/06/2014   CLINICAL DATA:  Dyspnea, cough, bronchitis  EXAM: CHEST  2 VIEW  COMPARISON:  12/17/2013  FINDINGS: Cardiomediastinal silhouette is stable. Mild elevation of the right hemidiaphragm again noted. No acute infiltrate or pleural effusion. No pulmonary edema. Degenerative changes right AC joint. Degenerative changes lower thoracic spine.  IMPRESSION: No active cardiopulmonary disease.   Electronically Signed   By: Lahoma Crocker M.D.   On: 04/06/2014 13:26   Dg Chest Port 1 View  04/23/2014   CLINICAL DATA:  Dyspnea and hypoxemia on BiPAP  EXAM: PORTABLE CHEST - 1 VIEW  COMPARISON:  DG CHEST 1V PORT dated 04/22/2014  FINDINGS: There is bilateral pulmonary hypoinflation. Increased density in the retrocardiac region on the left with new obscuration of the hemidiaphragm. Persistent perihilar pulmonary interstitial densities bilaterally The cardiopericardial silhouette remains enlarged.  IMPRESSION: Findings consistent with pulmonary interstitial edema likely of cardiac calls. A small left pleural effusion and left lower lobe atelectasis have developed.   Electronically Signed   By: David  Martinique   On: 04/23/2014 07:51   Dg Chest Port 1 View  04/22/2014   CLINICAL DATA:  Shortness of breath and weakness.  Chest pain.  EXAM: PORTABLE CHEST - 1 VIEW  COMPARISON:  04/06/2014  FINDINGS: Cardiac silhouette is mildly enlarged. The lungs are hypoinflated. There are new bilateral perihilar opacities, right greater than left. No definite pleural effusion or pneumothorax is identified. No acute osseous abnormality is seen.  IMPRESSION: Bilateral perihilar opacities, which may reflect edema or possibly multifocal pneumonia.   Electronically Signed   By: Logan Bores   On: 04/22/2014 14:38    Tawanna Sat, MD 04/27/2014, 6:59 AM PGY-1, [place] Intern pager: [phone], text pages welcome

## 2014-04-28 ENCOUNTER — Ambulatory Visit: Payer: Medicare Other | Admitting: Family Medicine

## 2014-04-28 LAB — CULTURE, BLOOD (ROUTINE X 2)
CULTURE: NO GROWTH
CULTURE: NO GROWTH

## 2014-04-28 NOTE — Progress Notes (Signed)
Family Medicine Teaching Service Attending Note  I interviewed and examined patient Belinda [name] and reviewed their tests and x-rays.  I discussed with Dr. Lamar Benes and reviewed their note for today.  I agree with their assessment and plan.     Additionally  Taking sips with family Able to slowly respond to questions  Appreciate palliative care consult - need to clarify disposition  Will proceed with comfort approach now.  If seems to improve and takes oral better may restart blood draws and medications as indicated

## 2014-04-28 NOTE — Progress Notes (Signed)
Progress Note from the Palliative Medicine Team at [facility] [place] spoke with [name] and Ms. Sear's sister Kirstie Mirza today. We discussed options such as home with hospice and hospice facility. I did tell [name] I would recommend hospice facility at this time. He asked about the option to "keep her here as hospice." I explained that I do not believe she meets the criteria for GIP at this time. He is struggling with these options and does not think it is a good idea to move her. He is worried that she will die alone (without family) in an ambulance. I agreed to wait and reassess again tomorrow. I provided emotional support and will try to continue and support Ms. Milone and her family.  Vinie Sill, NP Palliative Medicine Team Pager # [phone] (M-F 8a-5p) Team Phone # [phone] (Nights/Weekends)

## 2014-04-28 NOTE — Progress Notes (Signed)
Family Medicine Teaching Service Daily Progress Note Intern Pager: [phone]  Patient name: Belinda [name] Medical record number: [id number] Date of birth: 03-28-1927 Age: 80 y.o. Gender: female  Primary Care Provider: Lupita Dawn, MD Consultants: Palliative medicine. Cardiology, Pulmonology/CCM, Nephrology (all signed off) Code Status: DNR/DNI  Pt Overview and Major Events to Date:  5/20: BiPAP, cefepime x 1, IV lasix 5/21: CTX+azithro. Code blue with resusc, now comfort care 5/22: Palliative Care consult 5/23: Transfer out of SDU to floor, continue comfort care, plan to meet with Palliative 5/24: d/c ctx 5/25: d/c azithro, palliative meeting  Assessment and Plan: Belinda [name] is a 80 y.o. female presenting with worsening SOB and acute onset chest pain. PMH is significant for CAD x 4 vessel stent, HTN, HLD, CKD V (does not want Dialysis), R AV fistula, Single Kidney, Diastolic CHF (grade II) EF 55-60% in 12/2013, Anemia of Renal Disease   # Pulm: ARF on admission requiring BiPAP. Had respiratory arrest on 5/21, but since recovered and on [place] only. - azithro (5/21>>5/25). (5/21>>5/24) - oxygen by mask/[place]; do not use bipap and do not intubate - consult to palliative care, appreciate recommendations: will reassess today regarding hospice. Additional recs listed below - Ativan 0.5-1mg  q12, given concern for how BDZ affecting patient - Haldol 1mg  PO TID PRN anxiety - Fentanyl 27mcg q8hr scheduled,  q30 min PRN pain (use non-verbal cues) - Sorbitol PRN constipation - Zofran PRN nausea  # CV - Hx of CAD s/p 4 vessel stent/CHF.  Presentation consistent with NSTEMI and CHF exacerbation. Last echo 08/3809 grade ii diastolic with EF 17-51%. Risk strat labs: TSH (2.080), lipids (LDL 75). - No longer on Heparin gtt - discontinued IV lasix 5/26 (overall down 2.2L since admission per EMR I/Os) - Foley cath in place, decreased UOP  # Renal - CKD stage 5 Baseline Cr 4-5; s/p nephrectomy several  years ago, had a previous avf placed, not functioning currently, pt does not desire dialysis as an outpt; Vit D 10, likely 2/2 metabolic bone disease; s/p fluid bolus in the ED  - oral bicarb still ordered (last bicarb 11) - at this time not doing blood draws  # Heme - Anemia likely 2/2 renal disease, recent history of GI bleed in 12/2013. FOBT negative, but Hgb drop from 9.4 1 week ago to 8.3>7.1 - s/p transfused 1U PRBCs (5/21) (stopped short due to code blue) - not currently monitoring with blood draws  FEN/GI: comfort feeds  Prophylaxis: SCDs  Disposition: pending goals of care  Subjective:  Family members not at bedside. Pt a little more talkative today but primarily groans to respond to questions. Denies any pain currently, does say she is hungry.  Objective: Temp:  [98.2 F (36.8 C)] 98.2 F (36.8 C) (05/26 0521) Pulse Rate:  [68] 68 (05/26 0521) Resp:  [20] 20 (05/26 0521) BP: (159)/(59) 159/59 mmHg (05/26 0521) SpO2:  [100 %] 100 % (05/26 0521) Weight:  [126 lb 5.2 oz (57.3 kg)] 126 lb 5.2 oz (57.3 kg) (05/26 0521) Physical Exam: General: resting,NAD. HEENT: PERRL, EOMI.  Cardiovascular: RRR, no murmur appreciated Respiratory: persistent coarse rhonchi bilaterally, normal effort Abdomen: soft, nontender, nondistended, normal bowel sounds Extremities: trace edema bilaterally, WWP. Skin: diffuse ecchymosis on both dorsal aspects hand/wrists Neuro: awake, alert, oriented to person (not place / time), grossly non-focal  Laboratory:  Recent Labs Lab 04/22/14 1310 04/23/14 0309  WBC 16.8* 21.7*  HGB 8.3* 7.1*  HCT 24.1* 21.7*  PLT 131* 108*  Recent Labs Lab 04/22/14 1310 04/23/14 0309  NA 143 140  K 4.9 5.2  CL 108 110  CO2 12* 11*  BUN 64* 70*  CREATININE 4.85* 4.93*  CALCIUM 9.6 8.9  PROT 6.2  --   BILITOT 0.7  --   ALKPHOS 212*  --   ALT 41*  --   AST 57*  --   GLUCOSE 176* 227*   Lactic acid: 4.1>5.4>2.2>2.5 Troponin  1.74>2.38>4.58>4.64>5.75>5.59>5.73 No recent blood draws since Code Blue  Imaging/Diagnostic Tests:   Tawanna Sat, MD 04/28/2014, 8:21 AM PGY-1, [place] Intern pager: [phone], text pages welcome

## 2014-04-29 MED ORDER — LORAZEPAM 2 MG/ML IJ SOLN: 1.0000 mg | INTRAMUSCULAR | Status: DC | PRN

## 2014-04-29 MED ORDER — MORPHINE SULFATE (CONCENTRATE) 10 MG /0.5 ML PO SOLN
10.0000 mg | ORAL | Status: DC | PRN
  Administered 2014-04-29 (×2): 10 mg via SUBLINGUAL
  Filled 2014-04-29 (×2): qty 0.5

## 2014-04-29 MED ORDER — FENTANYL 25 MCG/HR TD PT72
25.0000 ug | MEDICATED_PATCH | TRANSDERMAL | Status: DC
  Administered 2014-04-29: 25 ug via TRANSDERMAL
  Filled 2014-04-29: qty 1

## 2014-04-29 NOTE — Progress Notes (Signed)
I spoke with granddaughter, Belinda [name], at Belinda [name]' bedside. Belinda [name]' is moaning but denies pain when asked. I will increase frequency of lorazepam as this may help with any anxiety she may be having. She denies pain but was having chest pain according to son this morning so primary team placed Fentanyl duragesic patch and added roxanol prn which is appropriate for her care at this point. I spoke with son, Belinda [name], over phone this afternoon. I reiterated the option of residential hospice but he tells me that he believes she will die if placed in ambulance transport and that he knows she only has 2-3 days left to live. I agree that anything may happen at any time. However, she is not currently meeting criteria for hospice GIP but we can continue to assess daily for this option. I will continue to follow and support.   Belinda Sill, NP Palliative Medicine Team Pager # [phone] (M-F 8a-5p) Team Phone # [phone] (Nights/Weekends)

## 2014-04-29 NOTE — Progress Notes (Signed)
Family Medicine Teaching Service Attending Note  I interviewed and examined patient Belinda [name] and reviewed their tests and x-rays.  I discussed with Dr. Lamar Benes and reviewed their note for today.  I agree with their assessment and plan.     Additionally  Will respond to voice but not intelligible No acute distress  Taking rare sips according to family  Recommend Change to oral or topical analgesics Clarify care site options and discuss with family

## 2014-04-29 NOTE — Progress Notes (Signed)
Family Medicine Teaching Service Daily Progress Note Intern Pager: [phone]  Patient name: Belinda [name] Medical record number: [id number] Date of birth: 06-04-1927 Age: 80 y.o. Gender: female  Primary Care Provider: Lupita Dawn, MD Consultants: Palliative medicine. Cardiology, Pulmonology/CCM, Nephrology (all signed off) Code Status: DNR/DNI  Pt Overview and Major Events to Date:  5/20: BiPAP, cefepime x 1, IV lasix 5/21: CTX+azithro. Code blue with resusc, now comfort care 5/22: Palliative Care consult 5/23: Transfer out of SDU to floor, continue comfort care, plan to meet with Palliative 5/24: d/c ctx 5/25: d/c azithro, palliative meeting  Assessment and Plan: Belinda [name] is a 80 y.o. female presenting with worsening SOB and acute onset chest pain. PMH is significant for CAD x 4 vessel stent, HTN, HLD, CKD V (does not want Dialysis), R AV fistula, Single Kidney, Diastolic CHF (grade II) EF 55-60% in 12/2013, Anemia of Renal Disease   # Pulm: ARF on admission requiring BiPAP. Had respiratory arrest on 5/21, but since recovered and on [place] only. Azithro (5/21>>5/25). CTX (5/21>>5/24). - oxygen by mask/[place]; do not use bipap and do not intubate - consult to palliative care, appreciate recommendations: recs listed below - Ativan 0.5-1mg  q12, given concern for how BDZ affecting patient - Haldol 1mg  PO TID PRN anxiety - Fentanyl 67mcg q8hr scheduled,  q30 min PRN pain (use non-verbal cues) - Sorbitol PRN constipation - Zofran PRN nausea  # CV - Hx of CAD s/p 4 vessel stent/CHF.  Presentation consistent with NSTEMI and CHF exacerbation. Last echo 05/3015 grade ii diastolic with EF 01-09%. Risk strat labs: TSH (2.080), lipids (LDL 75). - No longer on Heparin gtt - discontinued IV lasix 5/26 (overall down 2.2L since admission per EMR I/Os). If she has evidence of fluid overload restart at lower dose.  # Renal - CKD stage 5 Baseline Cr 4-5; s/p nephrectomy several years ago, had a previous  avf placed, not functioning currently, pt does not desire dialysis as an outpt; Vit D 10, likely 2/2 metabolic bone disease; s/p fluid bolus in the ED  - oral bicarb still ordered (last bicarb 11) - at this time not doing blood draws - Foley cath in place. Output yesterday -1L with no lasix  # Heme - Anemia likely 2/2 renal disease, recent history of GI bleed in 12/2013. FOBT negative, but Hgb drop from 9.4 1 week ago to 8.3>7.1 - s/p transfused 1U PRBCs (5/21) (stopped short due to code blue) - not currently monitoring with blood draws  FEN/GI: comfort feeds  Prophylaxis: SCDs  Disposition: pending goals of care  Subjective:  Moaning and groaning this morning, clutching toward arms. Mumbles a few inaudible/incoherent words. Per son at bedside, says she has been acting in pain since around 3:30am.   Son is concerned about any transfer with her and her passing away without a family member at bedside.  Objective: Temp:  [98.2 F (36.8 C)] 98.2 F (36.8 C) (05/27 0503) Pulse Rate:  [62] 62 (05/27 0503) Resp:  [21] 21 (05/27 0503) BP: (157)/(48) 157/48 mmHg (05/27 0503) SpO2:  [97 %] 97 % (05/27 0503) Weight:  [132 lb 15 oz (60.3 kg)] 132 lb 15 oz (60.3 kg) (05/27 0503) Physical Exam: General: in apparent distress. Cardiovascular: RRR, no murmur appreciated Respiratory: coarse rhonchi bilaterally, normal effort Abdomen: soft, nontender, nondistended, normal bowel sounds Extremities: both arms clutching over chest. trace edema bilaterally, WWP. Skin: diffuse ecchymosis on both dorsal aspects hand/wrists Neuro: moaning, intermittently alert.  Laboratory:  Recent Labs  Lab 04/22/14 1310 04/23/14 0309  WBC 16.8* 21.7*  HGB 8.3* 7.1*  HCT 24.1* 21.7*  PLT 131* 108*    Recent Labs Lab 04/22/14 1310 04/23/14 0309  NA 143 140  K 4.9 5.2  CL 108 110  CO2 12* 11*  BUN 64* 70*  CREATININE 4.85* 4.93*  CALCIUM 9.6 8.9  PROT 6.2  --   BILITOT 0.7  --   ALKPHOS 212*  --    ALT 41*  --   AST 57*  --   GLUCOSE 176* 227*   Lactic acid: 4.1>5.4>2.2>2.5 Troponin 1.74>2.38>4.58>4.64>5.75>5.59>5.73 No recent blood draws since Code Blue  Imaging/Diagnostic Tests:   Tawanna Sat, MD 04/29/2014, 8:14 AM PGY-1, [place] Intern pager: [phone], text pages welcome

## 2014-04-29 NOTE — Progress Notes (Addendum)
PT Cancellation Note  Patient Details Name: Belinda [name] MRN: [id number] DOB: 07-12-1927   Cancelled Treatment:    Reason Eval/Treat Not Completed: Other (comment) (palliative care/comfort care).  Per recent palliative care notes, pt is declining.  RN reports that family doesn't even want staff bathing her at this time.  It does not seem that PT is appropriate as family is trying to keep her as comfortable as possible.  She would likely be appropriate for hospice SNF placement or [facility] hospice services if she doesn't pass away here and stabilizes.  PT to sign off.  Please re-order if needed.  Thanks,   Barbarann Ehlers. Katalina Magri, PT, DPT [phone]   04/29/2014, 5:08 PM  04/29/2014 Addendum: PT also signed off for OT.  Barbarann Ehlers [place], [place], DPT [phone]

## 2014-04-30 ENCOUNTER — Encounter (HOSPITAL_COMMUNITY): Payer: Self-pay | Admitting: Internal Medicine

## 2014-04-30 ENCOUNTER — Encounter (HOSPITAL_COMMUNITY): Payer: Self-pay | Admitting: *Deleted

## 2014-04-30 ENCOUNTER — Inpatient Hospital Stay (HOSPITAL_COMMUNITY)
Admission: AD | Admit: 2014-04-30 | Discharge: 2014-05-04 | DRG: 193 | Disposition: E | Source: Ambulatory Visit | Attending: Internal Medicine | Admitting: Internal Medicine

## 2014-04-30 DIAGNOSIS — N184 Chronic kidney disease, stage 4 (severe): Secondary | ICD-10-CM | POA: Diagnosis present

## 2014-04-30 DIAGNOSIS — E119 Type 2 diabetes mellitus without complications: Secondary | ICD-10-CM | POA: Diagnosis present

## 2014-04-30 DIAGNOSIS — Z515 Encounter for palliative care: Secondary | ICD-10-CM

## 2014-04-30 DIAGNOSIS — Z905 Acquired absence of kidney: Secondary | ICD-10-CM

## 2014-04-30 DIAGNOSIS — H903 Sensorineural hearing loss, bilateral: Secondary | ICD-10-CM | POA: Diagnosis present

## 2014-04-30 DIAGNOSIS — I5032 Chronic diastolic (congestive) heart failure: Secondary | ICD-10-CM | POA: Diagnosis present

## 2014-04-30 DIAGNOSIS — I2510 Atherosclerotic heart disease of native coronary artery without angina pectoris: Secondary | ICD-10-CM | POA: Diagnosis present

## 2014-04-30 DIAGNOSIS — K117 Disturbances of salivary secretion: Secondary | ICD-10-CM

## 2014-04-30 DIAGNOSIS — D649 Anemia, unspecified: Secondary | ICD-10-CM | POA: Diagnosis present

## 2014-04-30 DIAGNOSIS — I509 Heart failure, unspecified: Secondary | ICD-10-CM | POA: Diagnosis present

## 2014-04-30 DIAGNOSIS — Z9861 Coronary angioplasty status: Secondary | ICD-10-CM

## 2014-04-30 DIAGNOSIS — Z833 Family history of diabetes mellitus: Secondary | ICD-10-CM

## 2014-04-30 DIAGNOSIS — Z66 Do not resuscitate: Secondary | ICD-10-CM | POA: Diagnosis present

## 2014-04-30 DIAGNOSIS — F411 Generalized anxiety disorder: Secondary | ICD-10-CM | POA: Diagnosis present

## 2014-04-30 DIAGNOSIS — IMO0002 Reserved for concepts with insufficient information to code with codable children: Secondary | ICD-10-CM | POA: Diagnosis present

## 2014-04-30 DIAGNOSIS — J9600 Acute respiratory failure, unspecified whether with hypoxia or hypercapnia: Secondary | ICD-10-CM | POA: Diagnosis present

## 2014-04-30 DIAGNOSIS — R0681 Apnea, not elsewhere classified: Secondary | ICD-10-CM | POA: Diagnosis present

## 2014-04-30 DIAGNOSIS — I214 Non-ST elevation (NSTEMI) myocardial infarction: Secondary | ICD-10-CM | POA: Diagnosis present

## 2014-04-30 DIAGNOSIS — R4182 Altered mental status, unspecified: Secondary | ICD-10-CM | POA: Diagnosis present

## 2014-04-30 DIAGNOSIS — M48061 Spinal stenosis, lumbar region without neurogenic claudication: Secondary | ICD-10-CM | POA: Diagnosis present

## 2014-04-30 DIAGNOSIS — I129 Hypertensive chronic kidney disease with stage 1 through stage 4 chronic kidney disease, or unspecified chronic kidney disease: Secondary | ICD-10-CM | POA: Diagnosis present

## 2014-04-30 DIAGNOSIS — J189 Pneumonia, unspecified organism: Principal | ICD-10-CM | POA: Diagnosis present

## 2014-04-30 MED ORDER — MORPHINE SULFATE (CONCENTRATE) 10 MG /0.5 ML PO SOLN
10.0000 mg | ORAL | Status: DC | PRN
  Administered 2014-05-01 – 2014-05-03 (×10): 10 mg via SUBLINGUAL
  Filled 2014-04-30 (×10): qty 0.5

## 2014-04-30 MED ORDER — ONDANSETRON HCL 4 MG/2ML IJ SOLN: 4.0000 mg | Freq: Four times a day (QID) | INTRAMUSCULAR | Status: DC | PRN

## 2014-04-30 MED ORDER — HYDROCORTISONE 2.5 % RE CREA
TOPICAL_CREAM | Freq: Two times a day (BID) | RECTAL | Status: DC | PRN
  Filled 2014-04-30: qty 28.35

## 2014-04-30 MED ORDER — BISACODYL 10 MG RE SUPP
10.0000 mg | Freq: Every day | RECTAL | Status: DC | PRN
  Administered 2014-04-30: 10 mg via RECTAL
  Filled 2014-04-30: qty 1

## 2014-04-30 MED ORDER — ALBUTEROL SULFATE (2.5 MG/3ML) 0.083% IN NEBU: 2.5000 mg | INHALATION_SOLUTION | RESPIRATORY_TRACT | Status: DC | PRN

## 2014-04-30 MED ORDER — MORPHINE SULFATE (CONCENTRATE) 10 MG /0.5 ML PO SOLN
10.0000 mg | ORAL | Status: DC
  Administered 2014-04-30 – 2014-05-01 (×4): 10 mg via ORAL
  Filled 2014-04-30 (×5): qty 0.5

## 2014-04-30 MED ORDER — ENSURE COMPLETE PO LIQD: 237.0000 mL | ORAL | Status: DC | PRN

## 2014-04-30 MED ORDER — BISACODYL 10 MG RE SUPP: 10.0000 mg | Freq: Every day | RECTAL | Status: DC | PRN

## 2014-04-30 MED ORDER — ENSURE PUDDING PO PUDG: 1.0000 | ORAL | Status: DC | PRN

## 2014-04-30 MED ORDER — FENTANYL 25 MCG/HR TD PT72
25.0000 ug | MEDICATED_PATCH | TRANSDERMAL | Status: DC
  Administered 2014-05-02: 25 ug via TRANSDERMAL
  Filled 2014-04-30: qty 1

## 2014-04-30 MED ORDER — MORPHINE SULFATE (CONCENTRATE) 10 MG /0.5 ML PO SOLN
10.0000 mg | ORAL | Status: DC
  Administered 2014-04-30: 10 mg via ORAL
  Filled 2014-04-30: qty 0.5

## 2014-04-30 MED ORDER — LORAZEPAM 2 MG/ML IJ SOLN: 1.0000 mg | INTRAMUSCULAR | Status: DC | PRN

## 2014-04-30 MED ORDER — PHENOL 1.4 % MT LIQD: 1.0000 | OROMUCOSAL | Status: DC | PRN

## 2014-04-30 NOTE — Progress Notes (Signed)
Family Medicine Teaching Service Daily Progress Note Intern Pager: [phone]  Patient name: Belinda [name] Medical record number: [id number] Date of birth: [birth year] Age: 80 y.o. Gender: female  Primary Care Provider: Lupita Dawn, MD Consultants: Palliative medicine. Cardiology, Pulmonology/CCM, Nephrology (all signed off) Code Status: DNR/DNI  Pt Overview and Major Events to Date:  5/20: BiPAP, cefepime x 1, IV lasix 5/21: CTX+azithro. Code blue with resusc, now comfort care 5/22: Palliative Care consult 5/23: Transfer out of SDU to floor, continue comfort care, plan to meet with Palliative 5/24: d/c ctx 5/25: d/c azithro, palliative meeting 5/27: fentanyl patch + roxanol  Assessment and Plan: Belinda [name] is a 80 y.o. female presenting with worsening SOB and acute onset chest pain. PMH is significant for CAD x 4 vessel stent, HTN, HLD, CKD V (does not want Dialysis), R AV fistula, Single Kidney, Diastolic CHF (grade II) EF 55-60% in 12/2013, Anemia of Renal Disease   # Pulm: ARF on admission requiring BiPAP. Had respiratory arrest on 5/21, but since recovered and on [place] only. Azithro (5/21>>5/25). CTX (5/21>>5/24). - oxygen by mask/[place]; do not use bipap and do not intubate - consult to palliative care, appreciate recommendations: recs listed below - Ativan 1mg  q4hr PRN, given concern for how BDZ affecting patient - Haldol 1mg  PO TID PRN anxiety - Fentanyl 33mcg/hr patch, roxanol 10mg  q1hr prn - Sorbitol PRN constipation - Zofran PRN nausea  # CV - Hx of CAD s/p 4 vessel stent/CHF.  Presentation consistent with NSTEMI and CHF exacerbation. Last echo 0/1601 grade ii diastolic with EF 09-32%. Risk strat labs: TSH (2.080), lipids (LDL 75). - No longer on Heparin gtt - discontinued IV lasix 5/26 (overall down 2.2L since admission per EMR I/Os). If she has evidence of fluid overload restart at lower dose.  # Renal - CKD stage 5 Baseline Cr 4-5; s/p nephrectomy several years ago, had a  previous avf placed, not functioning currently, pt does not desire dialysis as an outpt; Vit D 10, likely 2/2 metabolic bone disease; s/p fluid bolus in the ED  - oral bicarb still ordered (last bicarb 11) - at this time not doing blood draws - Foley cath in place. Output yesterday -800cc with no lasix  # Heme - Anemia likely 2/2 renal disease, recent history of GI bleed in 12/2013. FOBT negative, but Hgb drop from 9.4 1 week ago to 8.3>7.1 - s/p transfused 1U PRBCs (5/21) (stopped short due to code blue) - not currently monitoring with blood draws  FEN/GI: comfort feeds  Prophylaxis: SCDs  Disposition: pending placement  Subjective:  Sleeping this AM and does not wake up to voice/touch.   Family reports decreased consciousness, not eating/drinking, does not appear to be in as much pain since changing pain regimen; minimal moans/groans.   Objective: Temp:  [97.4 F (36.3 C)] 97.4 F (36.3 C) (05/28 0620) Pulse Rate:  [60] 60 (05/28 0620) Resp:  [14] 14 (05/28 0620) BP: (127)/(39) 127/39 mmHg (05/28 0620) SpO2:  [100 %] 100 % (05/28 0620) Weight:  [136 lb 11 oz (62 kg)] 136 lb 11 oz (62 kg) (05/28 3557) Physical Exam: General: sleeping Cardiovascular: RRR, no murmur appreciated Respiratory: coarse rhonchi bilaterally, normal effort Abdomen: soft, nontender, nondistended, normal bowel sounds Extremities: trace edema bilaterally, WWP. Skin: diffuse ecchymosis on both dorsal aspects hand/wrists Neuro: non-responsive to voice, touch  Laboratory: No results found for this basename: WBC, HGB, HCT, PLT,  in the last 168 hours No results found for this basename: NA,  K, CL, CO2, BUN, CREATININE, CALCIUM, LABALBU, PROT, BILITOT, ALKPHOS, ALT, AST, GLUCOSE,  in the last 168 hours Lactic acid: 4.1>5.4>2.2>2.5 Troponin 1.74>2.38>4.58>4.64>5.75>5.59>5.73 No recent blood draws since Code Blue on 5/21  Imaging/Diagnostic Tests:   Tawanna Sat, MD 04/15/2014, 8:18 AM PGY-1, [place] Intern pager: [phone], text pages welcome

## 2014-04-30 NOTE — Progress Notes (Addendum)
Inpatient [facility] Rm 6N18 [facility] RN Visit- M. Wynetta Emery, RN  Writer notified of referral for evaluation of GIP level of care;  Pt information reviewed with Lavina confirmed  Related admission to [facility] Dx: CAD (414.0); pt is a DNR code status  Pt admitted 5/20 with acute CP, worsenign SOB required BiPap; found to have new NSTEMI, CHF in setting of multiple co-morbities including CKD stage V and CAD.  Pt now with continued decline, minimally responsive- per chart review and discussion with staff RN Lovena Le pt now requiring frequent doses of PRN medication for s/sx discomfort and anxiety. Medication adjustments have been made by primary team and PMT and symptom management needs will continue to be evaluated. Pt seen at bedside opens eyes to voice, no verbal response to questions, pale, grimacing and moaning with touch, intermittent 5 second periods of apnea noted on this visit, RR=14, pt on 2 LNC. Transition to GIP level of care discussed with pt's son, Marc Morgans and sister, Kirstie Mirza at bedside who are in agreement; discussed briefly with family current prn and scheduled medications for comfort/symptom management and that this writer would notify Stage manager pt having discomfort at time of visit; family aware [facility] SW Erling Conte will see them shortly to go complete paperwork and that [facility] team will follow daily in collaboration with PMT attending.      Please call [facility] @ [phone]-  with any hospice needs.   Thank you.  Danton Sewer, RN  [facility]  Hospice Liaison  [phone])

## 2014-04-30 NOTE — Care Management Note (Signed)
    Page 1 of 1   04/12/2014     10:08:53 AM CARE MANAGEMENT NOTE 04/07/2014  Patient:  Belinda [name], Belinda [name]   Account Number:  [id number]  Date Initiated:  04/23/2014  Documentation initiated by:  Marvetta Gibbons  Subjective/Objective Assessment:   Pt admitted with acute resp. failure, NSTEMI,     Action/Plan:   PTA pt lived at home alone, NCM to follow for d/c needs   Anticipated DC Date:  04/24/2014   Anticipated DC Plan:        DC Planning Services  CM consult      Choice offered to / List presented to:             Status of service:  In process, will continue to follow Medicare Important Message given?   (If response is "NO", the following Medicare IM given date fields will be blank) Date Medicare IM given:  04/26/2014 Date Additional Medicare IM given:  04/29/2014  Discharge Disposition:    Per UR Regulation:  Reviewed for med. necessity/level of care/duration of stay  If discussed at [place] of Stay Meetings, dates discussed:   04/25/2014    Comments:  04/21/2014 GIP consult . Spoke to son, his girlfriend and step daughter at bedside . Chose [facility]osp[facility] and [place] . Referral made . Attending would like Palliative Medicine MD to be attending and will call Hospice MD phone to speak to Hospice MD. Magdalen Spatz RN BSN

## 2014-04-30 NOTE — Progress Notes (Signed)
Ms. Blow appears much changed from yesterday. She is minimally responsive (moans to movement) and has increasing grimacing and moaning. I will schedule roxanol every 4 hours (prn doses available as needed between scheduled doses). Also have lorazepam prn but may consider scheduling if she still appears anxious with moaning. Yesterday she was opening her eyes and would mumble "yes/no" answers and speak with family briefly but is not even opening her eyes this morning. She is now having episodes of apnea that are increasing in frequency according to family. Family continues to desire comfort measures. Prognosis poor at hours to days.  Vinie Sill, NP Palliative Medicine Team Pager # [phone] (M-F 8a-5p) Team Phone # [phone] (Nights/Weekends)

## 2014-04-30 NOTE — H&P (Addendum)
Palliative Medicine Team  History and Physical:    Belinda [name] YHC:[id number] DOB: 09/25/1927 DOA: 04/14/2014  Referring physician: Dr. Annabelle Harman PCP: Dossie Arbour, Lenna Sciara, MD   Chief Complaint: Shortness of breath and altered mental status status post non-ST elevation myocardial infarction  History of Present Illness:   Belinda [name] is an 80 y.o. female admitted with chest pain and shortness of breath. Patient with a past medical history first EKG 5 declining dialysis, anemia related to her renal disease, diastolic heart failure, hypertension, and coronary artery disease status post four-vessel stent. Patient was admitted with respiratory failure likely secondary to heart failure and pneumonia. She was initially treated with BiPAP, her course was complicated by a CODE BLUE when she developed respiratory failure. Family subsequently decided on full comfort measures. We are asked to admit her for general inpatient care at the end of her life.  ROS:   Constitutional: Unable to contribute secondary to mental status changes  Past Medical History:   Past Medical History  Diagnosis Date  . Stenosis, spinal, lumbar 05/20/2006    L 3-4, 4-5  MRI  . Bilateral sensorineural hearing loss 10/2006    audiogram  . Diabetes mellitus   . Chronic kidney disease   . Diverticulosis of colon   . H/O endoscopy 11/2006    benign duodenal scar  . Normal cardiac stress test 01/30/2006    Cardiolite EF 54%  . Abnormal barium swallow 12/2009    ?thyroid nodule  . Complication of anesthesia     ' I cant take propaphol "  . Hypertension   . Shortness of breath   . Asthma   . GERD (gastroesophageal reflux disease)   . Arthritis   . PONV (postoperative nausea and vomiting)   . Diverticulosis     Past Surgical History:   Past Surgical History  Procedure Laterality Date  . Combined hysterectomy abdominal w/ a&p repair / oophorectomy  1960's    for endometriosis  . Cholecystectomy open  2002  . Basal cell  carcinoma excision  06/2003    gluteal fold  . Retinal laser procedure  10/2003    left eye  . Nephrectomy  12/2003    left due to non-function  . Minor breast biopsy  06/2010    right   . Esophageal dilation  10/2012    [place] Dr Amedeo Plenty  . Abdominal hysterectomy    . Esophagogastroduodenoscopy N/A 12/18/2013    Procedure: ESOPHAGOGASTRODUODENOSCOPY (EGD);  Surgeon: Missy Sabins, MD;  Location: [facility] ENDOSCOPY;  Service: Endoscopy;  Laterality: N/A;  . Colonoscopy N/A 12/19/2013    Procedure: COLONOSCOPY;  Surgeon: Missy Sabins, MD;  Location: [facility];  Service: Endoscopy;  Laterality: N/A;    Social History:   History   Social History  . Marital Status: Widowed    Spouse Name: N/A    Number of Children: 1  . Years of Education: college   Occupational History  . Secretary-retired    Social History Main Topics  . Smoking status: Never Smoker   . Smokeless tobacco: Never Used  . Alcohol Use: No  . Drug Use: No  . Sexual Activity: Not on file   Other Topics Concern  . Not on file   Social History Narrative   Divorced x 2   Health Care POA:    Emergency Contact: Son, Marc Morgans (c) [phone]   End of Life Plan:    Who lives with you: self   Any pets: none  Diet: Patient has a varied diet but reports a lack of appetite and not eating very much throughout day.   Exercise: Pt does not have any regular exercise routine.   Seatbelts: Pt reports wearing seatbelt occasionally due to hernia.   Hobbies: tv, pt reports not doing anything                Family history:   Family History  Problem Relation Age of Onset  . Asthma Son   . Cancer Father     brain tumor  . Diabetes Father   . Cancer Mother     liver    Allergies   Fexofenadine; Trazodone and nefazodone; Ciprofloxacin; Diphenhydramine hcl; Gabapentin; Procaine hcl; Propofol; Sulfamethoxazole-trimethoprim; Codeine; and Doxycycline hyclate  Current Medications:   Prior to Admission medications   Not  on File    Physical Exam:   Filed Vitals:   04/29/2014 1300  Height: 4\' 11"  (1.499 m)  Weight: 62 kg (136 lb 11 oz)   PPS: 10%  No intake or output data in the 24 hours ending 04/15/2014 1302 LBM: 04/25/2014  is last  documented  Physical Exam:  General: Minimal distress, patient opens eyes to tactile stimuli and will moan softly HEENT:  Pupils are small but equal and round minimally reactive mucous membranes are dry Chest:   Chest is decreased but clear anteriorly don't appreciate any markers or wheezes CVS: Tachycardic positive S1 and S2 I don't appreciate S3 or S4 Abdomen: Soft nontender nondistended no grimace on palpation Ext: Warm, no edema Neuro: Altered mental status. Patient will open eyes to tactile stimuli but responsive looks through.      Data Review:    Labs: Troponin on 521 5.73, lactic acid 2.5 Sodium 140 potassium 5.2 with a creatinine of 4.93, WBC count 21.7 hemoglobin 7.1  BNP (last 3 results)  Recent Labs  12/17/13 0205 04/22/14 1310  PROBNP 7879.0* 35772.0*    Radiographic Studies:  IMPRESSION:  Findings consistent with pulmonary interstitial edema likely of  cardiac calls. A small left pleural effusion and left lower lobe  atelectasis have developed.     Assessment/Plan:   Active Problems:   1. CAD (coronary artery disease): Status post non-ST elevation MI. Ejection fraction last known to be 45%   2. CHF: Currently controlled with opiates. Continue fentanyl scheduled Roxanol with when necessary dosing 3. CKD stage IV 5 complicating diuresis. 4. Anxiety agree with when necessary Ativan  DVT prophylaxis: None necessary due to comfort measures 4. Pneumonia comfort measures treat shortness of breath and pain  Code Status: DO NOT RESUSCITATE Family Communication: Talked with sister at bedside  Disposition Plan: Probable hospital death  Time spent: 20 minutes   Leisel Pinette L. Lovena Le, MD MBA The Palliative Medicine Team at [facility] Phone: [phone] Pager: [phone]

## 2014-04-30 NOTE — H&P (Signed)
  Patient ID:Belinda [name]      DOB: 08-12-1927      TDD:[id number]  Request to admit patient under GIP status discussed with primary team.  Glad to assist in the care of this patient and will admit once approved by hospice.  Tadarius Maland L. Lovena Le, MD MBA The Palliative Medicine Team at [facility] Phone: [phone] Pager: [phone]

## 2014-04-30 NOTE — Progress Notes (Signed)
[facility] of [facility] Social Work Note:   Received request from [place] for [facility] admission for this patient. Met with patient's son [name] and friend Danise Mina to explain service, confirm interest and choice of attending, and complete [facility] admission paperwork. Forms signed and delivered to [facility] admitting at 11:22. Dr. Lovena Le and [facility] aware. [name] very appreciative of being able to remain in hospital, in same room, under hospice service with Dr. Lovena Le to assume care. [name] expressed much appreciation for excellent care from [facility] for past 30 years. Family working together to maintain vigil of family members that patient requested to be present to her death. Family at peace and report patient in good place mentally and spiritually and has told them she is at peace with approaching end of life. Family united in goals for good symptom management, family vigil and hospital death. Family report good support from their church, pastor visiting routinely. Arrangements made by patient with Shirley Friar. Family aware [facility] team collaborates daily with Dr. Lovena Le. They are aware of [facility] contact numbers and [facility] bereavement services for adults and children. Please do not hesitate to contact me for support to this family. Thank you. Erling Conte, [place]

## 2014-04-30 NOTE — Progress Notes (Signed)
Family Medicine Teaching Service Attending Note  I discussed patient Belinda [name]  with Dr. Lamar Benes and reviewed their note for today.  I agree with their assessment and plan.

## 2014-05-01 DIAGNOSIS — K1370 Unspecified lesions of oral mucosa: Secondary | ICD-10-CM

## 2014-05-01 MED ORDER — ATROPINE SULFATE 1 % OP SOLN
4.0000 [drp] | OPHTHALMIC | Status: DC | PRN
  Administered 2014-05-01 – 2014-05-02 (×4): 4 [drp] via SUBLINGUAL
  Filled 2014-05-01 (×3): qty 2

## 2014-05-01 MED ORDER — SCOPOLAMINE 1 MG/3DAYS TD PT72SCOPOLAMINE 1 MG/3DAYS
1.0000 | MEDICATED_PATCH | TRANSDERMAL | Status: DC
Start: 2014-05-01 — End: 2014-05-03
  Administered 2014-05-01: 1.5 mg via TRANSDERMAL
  Filled 2014-05-01: qty 1

## 2014-05-01 NOTE — Progress Notes (Signed)
.   Patient ID:Belinda [name]      DOB: 04/15/1927      CLE:[id number]   Palliative Medicine Team at [facility] Progress Note    Subjective:  Patient is near death with apnea and intermittent vocalizations.  Family would like to hold morphine for now as they do not see her as in pain or distress.  They are ok with the intermittent vocalizations and stated "they don't want her going to be with the Lord doped up."  We agreed that if she displayed any discomfort that they would permit morphine to be given.  Her son states "I have been selfish in wanting her to stay since Saturday.  I told her she can go".    Filed Vitals:   05/01/14 0706  BP: 94/26  Pulse: 76  Temp: 97.5 F (36.4 C)  Resp: 6   Physical exam:   General; no acute distress, near death with periods of apnea Pupils not examined . She does not open eyes to gentle touch or vocal stimuli Chest decreased with some upper airway rhonchi now present CVS: regular, S1, S2 Abd: soft no grimace Ext: cool but not mottled Neuro: obtunded   Assessment and plan: 80 yr old with advanced heart disease. Admitted with chest pain. Course complicated by respiratory arrest.  Patient is comfort care and is at end of life.  1.  DNR  2. Terminal secretions: add atropine and scopolamine  3.  Dyspnea: change morphine to prn  4. Agitation: continue prn ativan.   Updated Son [name] and [name] at the bedside.  Total time: [place]. Lovena Le, MD MBA The Palliative Medicine Team at [facility] Phone: [phone] Pager: [phone]

## 2014-05-01 NOTE — Progress Notes (Signed)
Inpatient [facility] Rm 6N18 [facility] RN Visit- M. Wynetta Emery, RN   Related GIP admission to [facility] Dx: CAD (414.0); pt is a DNR code status  Pt seen at bedside, intermittent moaning, no grimacing to touch, hands cool to touch with mottling noted in fingers;  occasional grunting respirations with 10 second periods of apnea noted on this visit, RR= 6, pt on 2 LNC; increased upper airway secretions noted. Dr Lovena Le has ordered Atropine drops and Scopolamine patch this morning Son Belinda Plate [facility]) and granddaughter Belinda [name] at bedside shared their awareness that death is close and they are taking comfort in memories and being with her when she passes; emotional support offered.  Belinda [name] shared they spoke with Dr Lovena Le this morning about pt's comfort and their wish to notify staff RN should they feel pt is uncomfortable rather than have medications scheduled. Family comfortable with notifying staff RN Lovena Le for additional comfort  with any concerns or patient showing any s/sx discomfort. Neck pillow offered for comfort and positioning- family appreciative of support and care given by 6N staff [facility] team will continue to follow with Dr Lovena Le attending. Please call [facility] at [phone] with any hospice needs.  Thank you Danton Sewer, [place] Liaison [phone]  Please notify PMT Attending [phone] and [facility] [phone] at time of death.

## 2014-05-02 NOTE — Progress Notes (Signed)
Related admission to [facility] diagnosis of heart disease.  DNR on chart.  Found patient non responsive at end of life.  Patient receiving Ativan and Morphine for symptom management.  Family expressed satisfaction with patient's level of comfort.  Continue supportive end of life care and encourage a call to Hospice at [phone] with any needs or at time of death.    03/14/2023 Vinetta Bergamo, RN, BSN Hospice

## 2014-05-02 NOTE — Progress Notes (Signed)
Patient ID:Gara VENEDA KIRKSEY      DOB: 1927-06-01      XNA:[id number]   Palliative Medicine Team at [facility] Progress Note    Subjective: Patient remains unresponsive, slight vocalizations that family is satisfied are not signs of distress.  Son and his significant other are at bedside. Patient received some morphine last evening.  Family prefers to ask for morphine instead of scheduling despite vocalizations ( not really a moan, more of a sigh with voice). Quida is more mottled in her fingertips today and feet are cold.  Filed Vitals:   05/02/14 0619  BP: 86/31  Pulse: 89  Temp: 97.6 F (36.4 C)  Resp: 5   Physical exam:  General: fast soft, no brow furrowing. Soft end of expiration vocalization intermittently Pupils not examined, mm dry Chest : less upper airway wetness this am, shallow respirations with apnea CVS: slightly faster than yesterday, s1, s2 Abd: no grimace on palpation Ext: cool with mottling of finger tips Neuro: not responsive to tactile or verbal stimuli Assessment and plan: 80 yr old with advanced CAD, s/p respiratory arrest. Family has requested full comfort care. She has been near death.  1.  DNR  2.  Terminal secretions improved with treatment  3.  Dyspnea: patient now apneic at baseline. Morphine is prn. Could consider weaning [place] down to RA if she continues to hold fast.  4.  Agitation: stable with ativan.   Total time:  730 pm-750 pm

## 2014-05-02 NOTE — Plan of Care (Signed)
Problem: Discharge Progression Outcomes Goal: Barriers To Progression Addressed/Resolved Outcome: Not Progressing Patient on End of Life Care

## 2014-05-04 NOTE — Progress Notes (Signed)
Called to room by visitor stating that patient is not breathing. Entered room to find that patient was not breathing. Color cyanotic. No breath sounds on ausculatation. No Blood pressure present. No pulses palpated or auscultated. Verified with Laurita Quint, RN. Time of Death 1542. Dr. Lovena Le notified of expiration.

## 2014-05-04 NOTE — Progress Notes (Signed)
Patient ID:Belinda [name]      DOB: 03/20/1927      ESP:[id number]   Palliative Medicine Team at [facility] Progress Note    Subjective:  Belinda [name] remains slowly leaving this world. Apnea persists but Belinda [name] continues to struggle on. Belinda [name] does not appear in distress.  Belinda [name] will vocalize intermittently .   Family is not at the bedside.  Filed Vitals:   04/28/2014 0645  BP:   Pulse:   Temp: 98.7 F (37.1 C)  Resp: 5   Physical exam:   General no acute distress, brow soft Pupils not examined, mm dry Chest decreased no rhonchi , or rales CVS: regular, S1, S2 Abd soft no grimace Ext: warm today , slightly dusky finger tips but no mottling on the feet Neuro: obtunded   Assessment and plan: 80 yr old with advanced CAD s/p respiratory arrest now comfort measures  1. DNR comfort measures only  2.  Prn Morphine for dyspnea  3.  Agitation: on ativan   Total time 850 - 900 pm  Rosali Augello L. Lovena Le, MD MBA The Palliative Medicine Team at [facility] Phone: [phone] Pager: [phone]

## 2014-05-04 NOTE — Progress Notes (Signed)
DNR.  Related admission.  Patient unresponsive and resting comfortably at this time.  No family present at the bedside.  Chart reviewed.  Please contact [place] with any questions or concerns or time of death.  Vance Gather, RN [facility]

## 2014-05-04 NOTE — Discharge Summary (Signed)
Death Summary  KELSEA MOUSEL SUP:[id number] DOB: 1927/08/16 DOA: May 29, 2014  PCP: Lupita Dawn, MD PCP/Office notified: Will be notified in the am.  Admit date: 05-29-2014 Date of Death: 06-01-2014  Final Diagnoses:  Active Problems:   CAD (coronary artery disease) Respiratory failure    History of present illness:  80 yr old white female admitted with shortness of breath and chest pain found to have NSTEMI. Hospital course was complicated by respiratory arrest secondary to heart failure and pneumonia.   Family elected to make her comfort care.     Hospital Course:  Patient was transitioned from inpatient teaching service to [facility] hospice service due to the imminence of her decline.  Symptoms of agitation were treated with ativan and dyspnea and pain were treated initially with scheduled morphine but subsequently with prn morphine.  Patient was found without respirations 1542.  Her family had just left her bedside. Condolences were conveyed. Nursing pronounced the patient.   Time: 1542  Signed:  Arval Brandstetter L. Lovena Le, MD MBA The Palliative Medicine Team at [facility] Phone: [phone] Pager: [phone]

## 2014-05-04 DEATH — deceased

## 2014-05-27 NOTE — Consult Note (Signed)
I have reviewed and discussed the care of this patient in detail with the nurse practitioner including pertinent patient records, physical exam findings and data. I agree with details of this encounter.  

## 2014-06-03 DEATH — deceased

## 2014-07-20 ENCOUNTER — Ambulatory Visit: Payer: Medicare Other | Admitting: Interventional Cardiology

## 2014-11-19 ENCOUNTER — Other Ambulatory Visit: Payer: Self-pay | Admitting: Nurse Practitioner

## 2015-08-13 IMAGING — CR DG CHEST 2V
2 series · 2 of 2 positions shown · non-contrast
Comparison: 12/17/2013

CLINICAL DATA: Dyspnea, cough, bronchitis

EXAM:
CHEST  2 VIEW

[w chest pa]
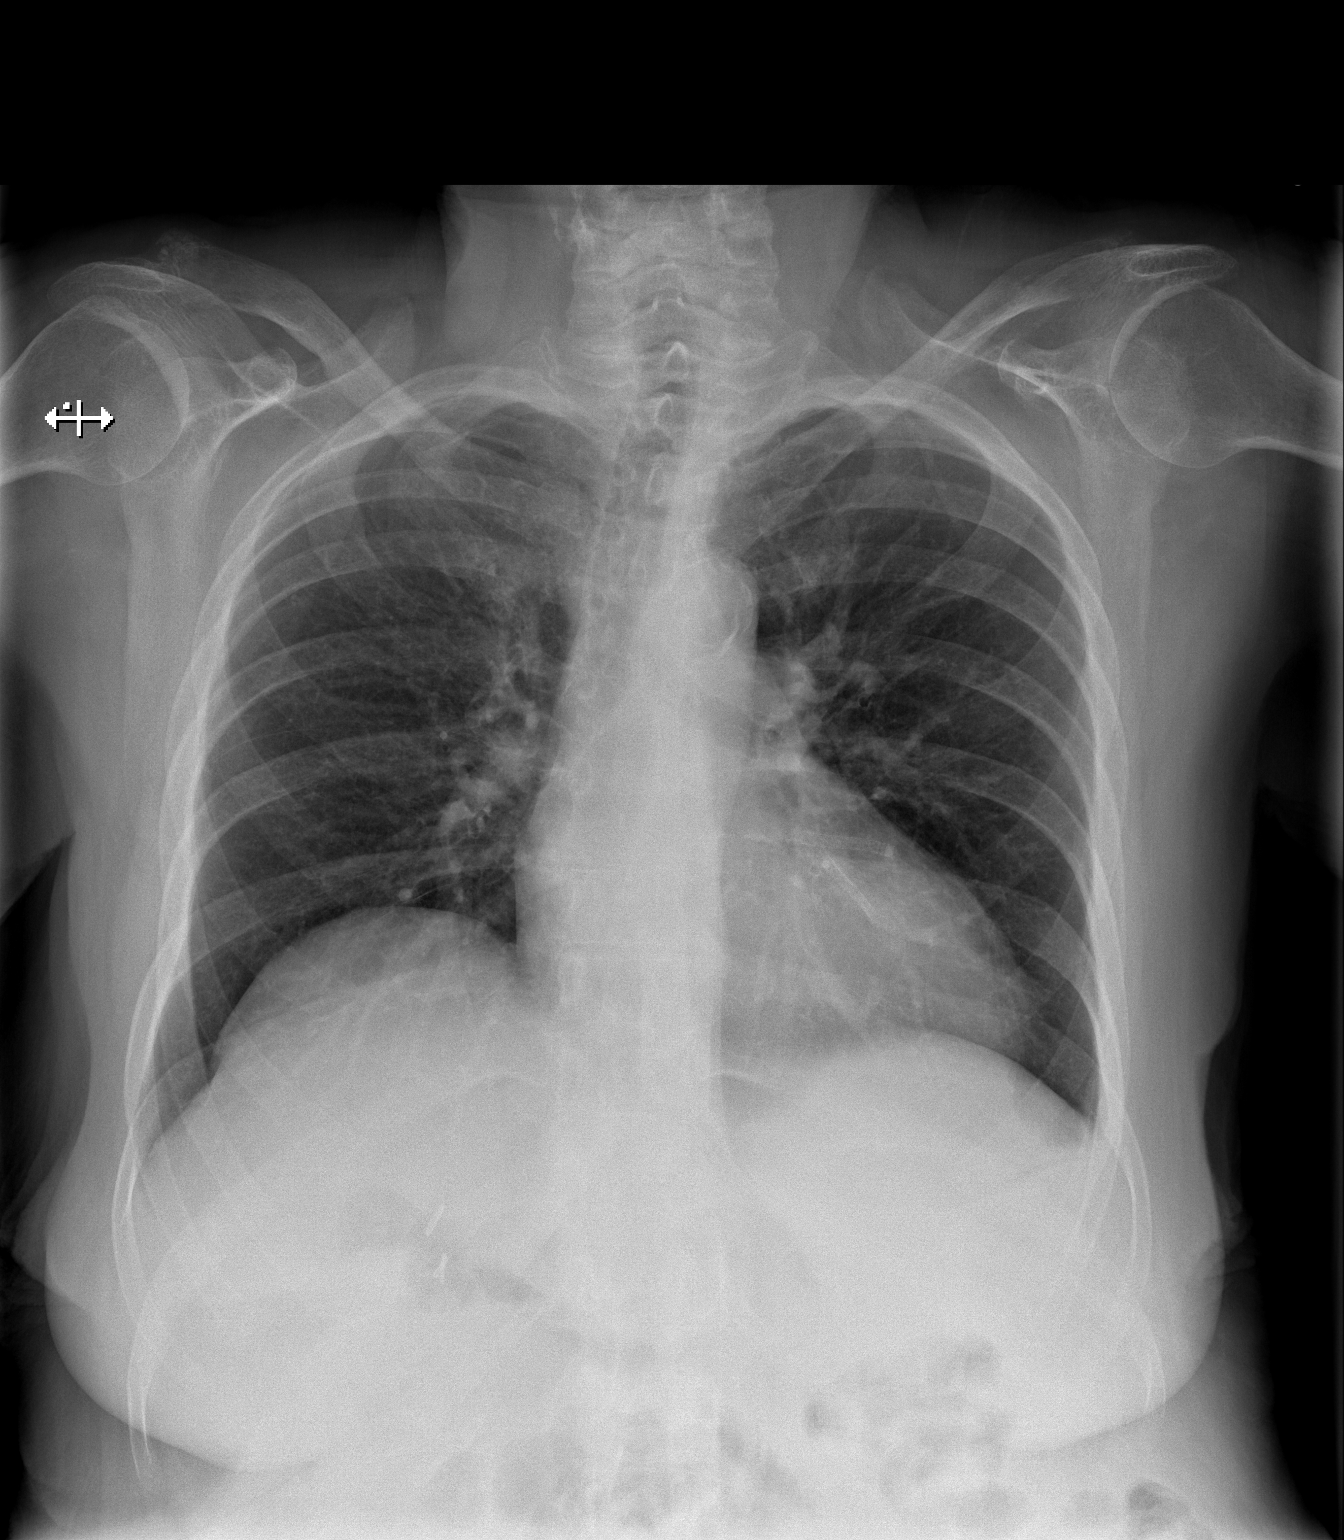

[w chest lat]
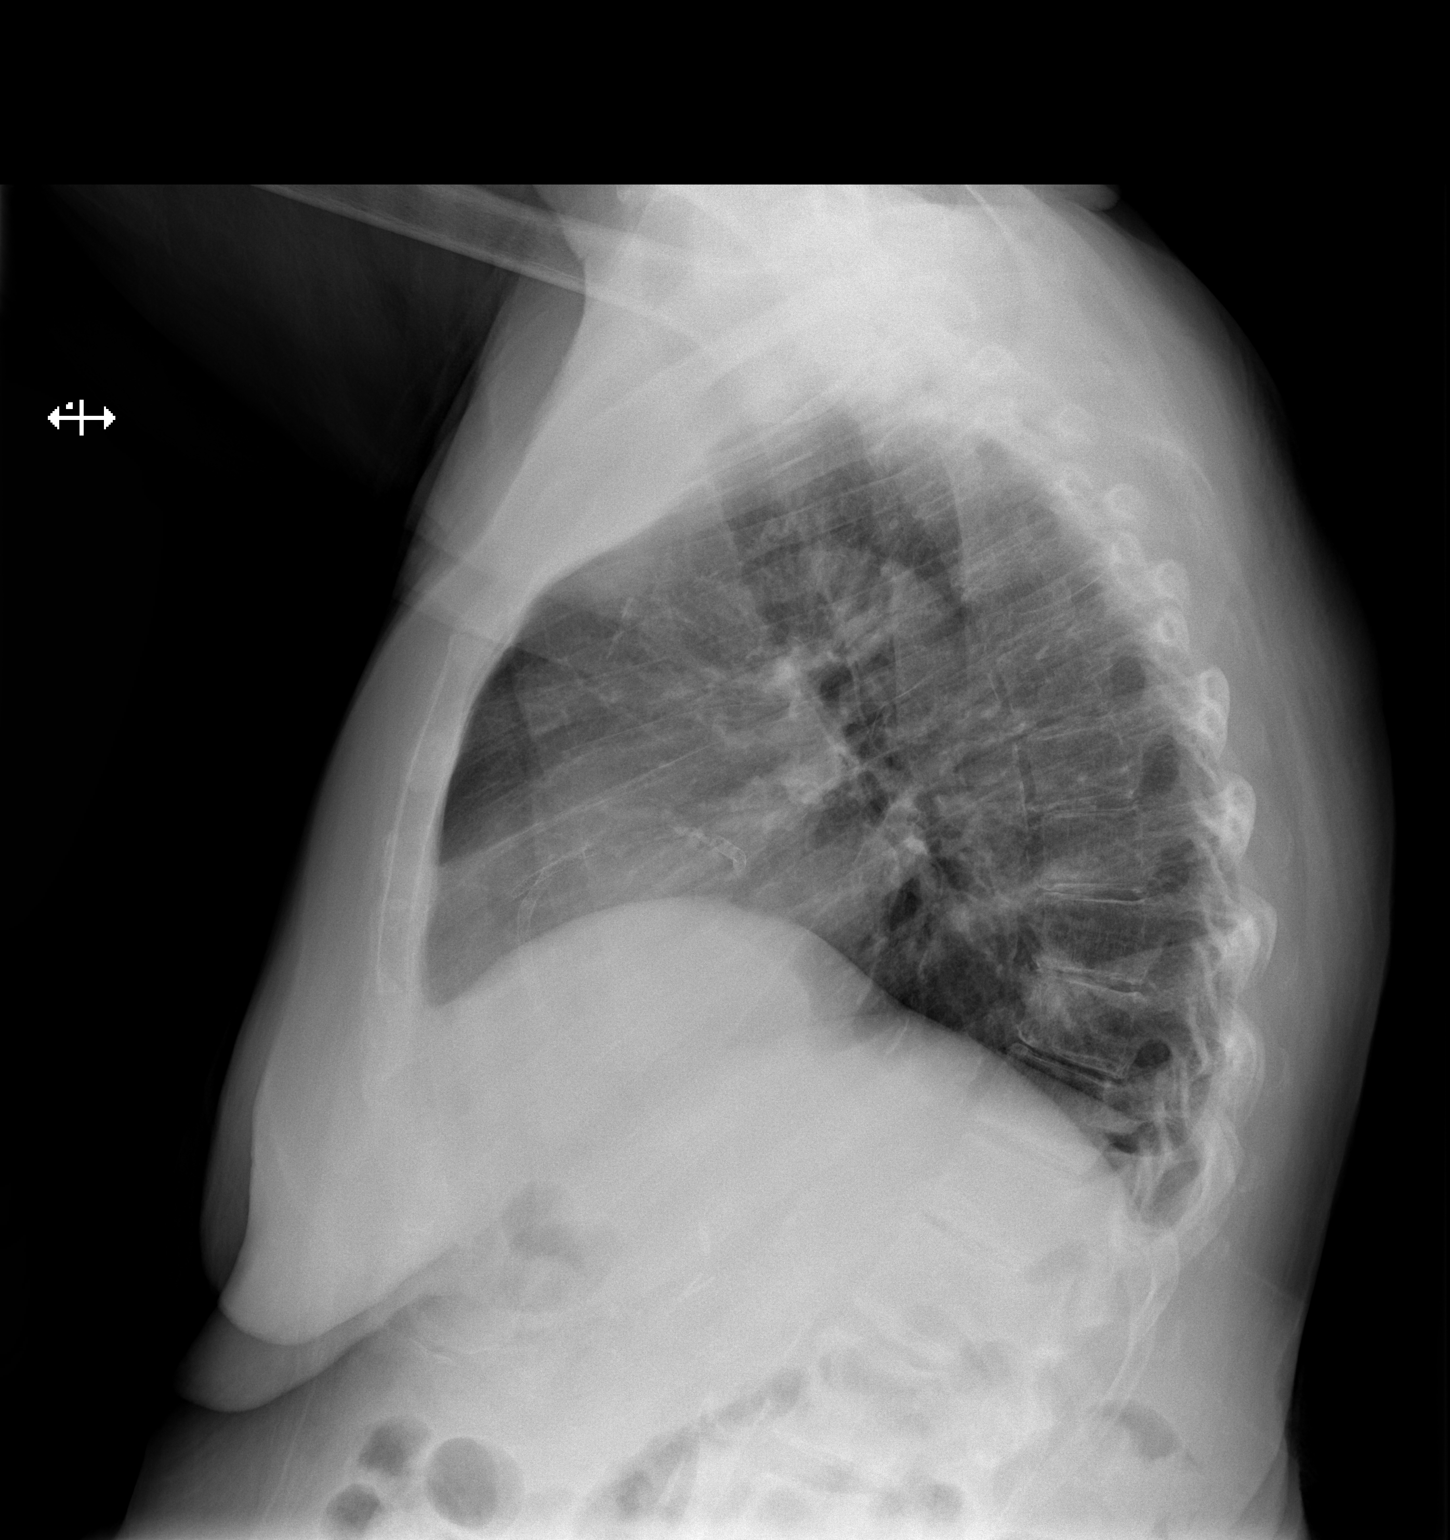

[2 of 2 positions shown; findings below may reference images not displayed]

FINDINGS: Cardiomediastinal silhouette is stable. Mild elevation of the right
hemidiaphragm again noted. No acute infiltrate or pleural effusion.
No pulmonary edema. Degenerative changes right AC joint.
Degenerative changes lower thoracic spine.
IMPRESSION: No active cardiopulmonary disease.
